# Patient Record
Sex: Male | Born: 1965
Health system: Southern US, Community
[De-identification: ages and names within clinical notes are randomized; demographics above are authoritative.]

## PROBLEM LIST (undated history)

## (undated) DIAGNOSIS — I639 Cerebral infarction, unspecified: Secondary | ICD-10-CM

## (undated) DIAGNOSIS — E119 Type 2 diabetes mellitus without complications: Secondary | ICD-10-CM

## (undated) DIAGNOSIS — I10 Essential (primary) hypertension: Secondary | ICD-10-CM

## (undated) DIAGNOSIS — I619 Nontraumatic intracerebral hemorrhage, unspecified: Secondary | ICD-10-CM

## (undated) DIAGNOSIS — E7800 Pure hypercholesterolemia, unspecified: Secondary | ICD-10-CM

## (undated) HISTORY — DX: Type 2 diabetes mellitus without complications: E11.9

## (undated) HISTORY — DX: Nontraumatic intracerebral hemorrhage, unspecified: I61.9

## (undated) HISTORY — DX: Pure hypercholesterolemia, unspecified: E78.00

---

## 1997-07-17 ENCOUNTER — Ambulatory Visit (HOSPITAL_BASED_OUTPATIENT_CLINIC_OR_DEPARTMENT_OTHER): Admission: RE | Admit: 1997-07-17 | Discharge: 1997-07-17 | Payer: Self-pay | Admitting: Otolaryngology

## 1998-02-15 HISTORY — PX: TONSILLECTOMY: SUR1361

## 1998-08-31 ENCOUNTER — Emergency Department (HOSPITAL_COMMUNITY): Admission: EM | Admit: 1998-08-31 | Discharge: 1998-08-31 | Payer: Self-pay | Admitting: Emergency Medicine

## 2004-06-13 ENCOUNTER — Emergency Department (HOSPITAL_COMMUNITY): Admission: EM | Admit: 2004-06-13 | Discharge: 2004-06-13 | Payer: Self-pay | Admitting: Family Medicine

## 2007-08-05 ENCOUNTER — Emergency Department (HOSPITAL_COMMUNITY): Admission: EM | Admit: 2007-08-05 | Discharge: 2007-08-05 | Payer: Self-pay | Admitting: Emergency Medicine

## 2010-11-12 LAB — CBC
HCT: 42
Hemoglobin: 14
MCHC: 33.3
MCV: 87.5
Platelets: 331
RBC: 4.8
RDW: 13.9
WBC: 8

## 2010-11-12 LAB — DIFFERENTIAL
Basophils Absolute: 0.1
Basophils Relative: 1
Eosinophils Absolute: 0
Eosinophils Relative: 0
Lymphocytes Relative: 18
Lymphs Abs: 1.4
Monocytes Absolute: 0.8
Monocytes Relative: 10
Neutro Abs: 5.6
Neutrophils Relative %: 70

## 2010-11-12 LAB — POCT I-STAT, CHEM 8
BUN: 17
Calcium, Ion: 1.17
Chloride: 103
Creatinine, Ser: 1.2
Glucose, Bld: 115 — ABNORMAL HIGH
HCT: 45
Hemoglobin: 15.3
Potassium: 4.6
Sodium: 139
TCO2: 31

## 2011-10-01 ENCOUNTER — Inpatient Hospital Stay (HOSPITAL_COMMUNITY): Payer: BC Managed Care – PPO

## 2011-10-01 ENCOUNTER — Inpatient Hospital Stay (HOSPITAL_COMMUNITY)
Admission: EM | Admit: 2011-10-01 | Discharge: 2011-10-22 | DRG: 879 | Disposition: A | Discharge status: Rehabilitation Facility | Payer: BC Managed Care – PPO | Attending: Pulmonary Disease | Admitting: Pulmonary Disease

## 2011-10-01 ENCOUNTER — Encounter (HOSPITAL_COMMUNITY): Payer: Self-pay

## 2011-10-01 ENCOUNTER — Emergency Department (HOSPITAL_COMMUNITY): Payer: BC Managed Care – PPO

## 2011-10-01 DIAGNOSIS — I517 Cardiomegaly: Secondary | ICD-10-CM | POA: Diagnosis present

## 2011-10-01 DIAGNOSIS — Z930 Tracheostomy status: Secondary | ICD-10-CM

## 2011-10-01 DIAGNOSIS — R4182 Altered mental status, unspecified: Secondary | ICD-10-CM | POA: Diagnosis present

## 2011-10-01 DIAGNOSIS — R112 Nausea with vomiting, unspecified: Secondary | ICD-10-CM | POA: Diagnosis not present

## 2011-10-01 DIAGNOSIS — R509 Fever, unspecified: Secondary | ICD-10-CM | POA: Diagnosis not present

## 2011-10-01 DIAGNOSIS — I619 Nontraumatic intracerebral hemorrhage, unspecified: Secondary | ICD-10-CM

## 2011-10-01 DIAGNOSIS — R0902 Hypoxemia: Secondary | ICD-10-CM | POA: Diagnosis present

## 2011-10-01 DIAGNOSIS — E876 Hypokalemia: Secondary | ICD-10-CM | POA: Diagnosis not present

## 2011-10-01 DIAGNOSIS — G911 Obstructive hydrocephalus: Secondary | ICD-10-CM | POA: Diagnosis present

## 2011-10-01 DIAGNOSIS — R29810 Facial weakness: Secondary | ICD-10-CM | POA: Diagnosis present

## 2011-10-01 DIAGNOSIS — Z91199 Patient's noncompliance with other medical treatment and regimen due to unspecified reason: Secondary | ICD-10-CM

## 2011-10-01 DIAGNOSIS — Z9119 Patient's noncompliance with other medical treatment and regimen: Secondary | ICD-10-CM

## 2011-10-01 DIAGNOSIS — D72829 Elevated white blood cell count, unspecified: Secondary | ICD-10-CM | POA: Diagnosis not present

## 2011-10-01 DIAGNOSIS — G936 Cerebral edema: Secondary | ICD-10-CM | POA: Diagnosis present

## 2011-10-01 DIAGNOSIS — R490 Dysphonia: Secondary | ICD-10-CM | POA: Diagnosis present

## 2011-10-01 DIAGNOSIS — R259 Unspecified abnormal involuntary movements: Secondary | ICD-10-CM | POA: Diagnosis not present

## 2011-10-01 DIAGNOSIS — IMO0002 Reserved for concepts with insufficient information to code with codable children: Secondary | ICD-10-CM | POA: Diagnosis not present

## 2011-10-01 DIAGNOSIS — R32 Unspecified urinary incontinence: Secondary | ICD-10-CM | POA: Diagnosis present

## 2011-10-01 DIAGNOSIS — R000 Tachycardia, unspecified: Secondary | ICD-10-CM | POA: Diagnosis present

## 2011-10-01 DIAGNOSIS — G8190 Hemiplegia, unspecified affecting unspecified side: Secondary | ICD-10-CM | POA: Diagnosis present

## 2011-10-01 DIAGNOSIS — Z781 Physical restraint status: Secondary | ICD-10-CM | POA: Diagnosis not present

## 2011-10-01 DIAGNOSIS — E162 Hypoglycemia, unspecified: Secondary | ICD-10-CM | POA: Diagnosis not present

## 2011-10-01 DIAGNOSIS — R51 Headache: Secondary | ICD-10-CM | POA: Diagnosis not present

## 2011-10-01 DIAGNOSIS — J9601 Acute respiratory failure with hypoxia: Secondary | ICD-10-CM | POA: Diagnosis not present

## 2011-10-01 DIAGNOSIS — R079 Chest pain, unspecified: Secondary | ICD-10-CM | POA: Diagnosis present

## 2011-10-01 DIAGNOSIS — J9600 Acute respiratory failure, unspecified whether with hypoxia or hypercapnia: Secondary | ICD-10-CM

## 2011-10-01 DIAGNOSIS — G932 Benign intracranial hypertension: Secondary | ICD-10-CM | POA: Diagnosis present

## 2011-10-01 DIAGNOSIS — I10 Essential (primary) hypertension: Secondary | ICD-10-CM

## 2011-10-01 DIAGNOSIS — R1310 Dysphagia, unspecified: Secondary | ICD-10-CM | POA: Diagnosis not present

## 2011-10-01 DIAGNOSIS — E871 Hypo-osmolality and hyponatremia: Secondary | ICD-10-CM | POA: Diagnosis present

## 2011-10-01 DIAGNOSIS — Z8673 Personal history of transient ischemic attack (TIA), and cerebral infarction without residual deficits: Secondary | ICD-10-CM

## 2011-10-01 HISTORY — DX: Essential (primary) hypertension: I10

## 2011-10-01 HISTORY — DX: Cerebral infarction, unspecified: I63.9

## 2011-10-01 HISTORY — DX: Nontraumatic intracerebral hemorrhage, unspecified: I61.9

## 2011-10-01 LAB — CBC WITH DIFFERENTIAL/PLATELET
Basophils Absolute: 0 10*3/uL (ref 0.0–0.1)
Basophils Relative: 0 % (ref 0–1)
Eosinophils Absolute: 0 10*3/uL (ref 0.0–0.7)
Eosinophils Relative: 1 % (ref 0–5)
HCT: 39.2 % (ref 39.0–52.0)
Hemoglobin: 12.9 g/dL — ABNORMAL LOW (ref 13.0–17.0)
Lymphocytes Relative: 20 % (ref 12–46)
Lymphs Abs: 1.2 10*3/uL (ref 0.7–4.0)
MCH: 29.2 pg (ref 26.0–34.0)
MCHC: 32.9 g/dL (ref 30.0–36.0)
MCV: 88.7 fL (ref 78.0–100.0)
Monocytes Absolute: 0.4 10*3/uL (ref 0.1–1.0)
Monocytes Relative: 6 % (ref 3–12)
Neutro Abs: 4.5 10*3/uL (ref 1.7–7.7)
Neutrophils Relative %: 73 % (ref 43–77)
Platelets: 262 10*3/uL (ref 150–400)
RBC: 4.42 MIL/uL (ref 4.22–5.81)
RDW: 13.1 % (ref 11.5–15.5)
WBC: 6.2 10*3/uL (ref 4.0–10.5)

## 2011-10-01 LAB — POCT I-STAT, CHEM 8
BUN: 10 mg/dL (ref 6–23)
Calcium, Ion: 1.18 mmol/L (ref 1.12–1.23)
Chloride: 105 mEq/L (ref 96–112)
Creatinine, Ser: 1.1 mg/dL (ref 0.50–1.35)
Glucose, Bld: 147 mg/dL — ABNORMAL HIGH (ref 70–99)
HCT: 42 % (ref 39.0–52.0)
Hemoglobin: 14.3 g/dL (ref 13.0–17.0)
Potassium: 3.8 mEq/L (ref 3.5–5.1)
Sodium: 143 mEq/L (ref 135–145)
TCO2: 26 mmol/L (ref 0–100)

## 2011-10-01 LAB — BLOOD GAS, ARTERIAL
Acid-Base Excess: 1.9 mmol/L (ref 0.0–2.0)
Acid-Base Excess: 1 mmol/L (ref 0.0–2.0)
Bicarbonate: 27.1 mEq/L — ABNORMAL HIGH (ref 20.0–24.0)
Bicarbonate: 25.3 mEq/L — ABNORMAL HIGH (ref 20.0–24.0)
Drawn by: 320991
Drawn by: 320991
O2 Content: 2 L/min
O2 Content: 2 L/min
O2 Saturation: 98.5 %
O2 Saturation: 96.8 %
Patient temperature: 98.6
Patient temperature: 98.6
TCO2: 28.6 mmol/L (ref 0–100)
TCO2: 26.5 mmol/L (ref 0–100)
pCO2 arterial: 51 mmHg — ABNORMAL HIGH (ref 35.0–45.0)
pCO2 arterial: 41.3 mmHg (ref 35.0–45.0)
pH, Arterial: 7.345 — ABNORMAL LOW (ref 7.350–7.450)
pH, Arterial: 7.403 (ref 7.350–7.450)
pO2, Arterial: 99.3 mmHg (ref 80.0–100.0)
pO2, Arterial: 84.1 mmHg (ref 80.0–100.0)

## 2011-10-01 LAB — CK TOTAL AND CKMB (NOT AT ARMC)
CK, MB: 3.3 ng/mL (ref 0.3–4.0)
Relative Index: 1.2 (ref 0.0–2.5)
Total CK: 277 U/L — ABNORMAL HIGH (ref 7–232)

## 2011-10-01 LAB — DIFFERENTIAL
Basophils Absolute: 0 10*3/uL (ref 0.0–0.1)
Basophils Relative: 0 % (ref 0–1)
Eosinophils Absolute: 0.2 10*3/uL (ref 0.0–0.7)
Eosinophils Relative: 3 % (ref 0–5)
Lymphocytes Relative: 38 % (ref 12–46)
Lymphs Abs: 2.4 10*3/uL (ref 0.7–4.0)
Monocytes Absolute: 0.6 10*3/uL (ref 0.1–1.0)
Monocytes Relative: 10 % (ref 3–12)
Neutro Abs: 3 10*3/uL (ref 1.7–7.7)
Neutrophils Relative %: 49 % (ref 43–77)

## 2011-10-01 LAB — PROTIME-INR
INR: 1.04 (ref 0.00–1.49)
INR: 1.09 (ref 0.00–1.49)
Prothrombin Time: 13.8 seconds (ref 11.6–15.2)
Prothrombin Time: 14.3 seconds (ref 11.6–15.2)

## 2011-10-01 LAB — COMPREHENSIVE METABOLIC PANEL
ALT: 23 U/L (ref 0–53)
ALT: 23 U/L (ref 0–53)
AST: 24 U/L (ref 0–37)
AST: 23 U/L (ref 0–37)
Albumin: 3.5 g/dL (ref 3.5–5.2)
Albumin: 3.6 g/dL (ref 3.5–5.2)
Alkaline Phosphatase: 109 U/L (ref 39–117)
Alkaline Phosphatase: 104 U/L (ref 39–117)
BUN: 11 mg/dL (ref 6–23)
BUN: 9 mg/dL (ref 6–23)
CO2: 25 mEq/L (ref 19–32)
CO2: 30 mEq/L (ref 19–32)
Calcium: 9.3 mg/dL (ref 8.4–10.5)
Calcium: 9.2 mg/dL (ref 8.4–10.5)
Chloride: 103 mEq/L (ref 96–112)
Chloride: 104 mEq/L (ref 96–112)
Creatinine, Ser: 0.92 mg/dL (ref 0.50–1.35)
Creatinine, Ser: 0.9 mg/dL (ref 0.50–1.35)
GFR calc Af Amer: >90 mL/min (ref >90)
GFR calc Af Amer: >90 mL/min (ref >90)
GFR calc non Af Amer: >90 mL/min (ref >90)
GFR calc non Af Amer: >90 mL/min (ref >90)
Glucose, Bld: 144 mg/dL — ABNORMAL HIGH (ref 70–99)
Glucose, Bld: 127 mg/dL — ABNORMAL HIGH (ref 70–99)
Potassium: 3.8 mEq/L (ref 3.5–5.1)
Potassium: 4 mEq/L (ref 3.5–5.1)
Sodium: 138 mEq/L (ref 135–145)
Sodium: 139 mEq/L (ref 135–145)
Total Bilirubin: 0.2 mg/dL — ABNORMAL LOW (ref 0.3–1.2)
Total Bilirubin: 0.2 mg/dL — ABNORMAL LOW (ref 0.3–1.2)
Total Protein: 8.5 g/dL — ABNORMAL HIGH (ref 6.0–8.3)
Total Protein: 8.6 g/dL — ABNORMAL HIGH (ref 6.0–8.3)

## 2011-10-01 LAB — VITAMIN B12: Vitamin B-12: 687 pg/mL (ref 211–911)

## 2011-10-01 LAB — URINALYSIS, ROUTINE W REFLEX MICROSCOPIC
Bilirubin Urine: NEGATIVE
Glucose, UA: NEGATIVE mg/dL
Ketones, ur: NEGATIVE mg/dL
Leukocytes, UA: NEGATIVE
Nitrite: NEGATIVE
Protein, ur: 30 mg/dL — AB
Specific Gravity, Urine: 1.012 (ref 1.005–1.030)
Urobilinogen, UA: 1 mg/dL (ref 0.0–1.0)
pH: 7 (ref 5.0–8.0)

## 2011-10-01 LAB — URINE MICROSCOPIC-ADD ON

## 2011-10-01 LAB — CBC
HCT: 39.8 % (ref 39.0–52.0)
Hemoglobin: 13.3 g/dL (ref 13.0–17.0)
MCH: 29.4 pg (ref 26.0–34.0)
MCHC: 33.4 g/dL (ref 30.0–36.0)
MCV: 88.1 fL (ref 78.0–100.0)
Platelets: 265 10*3/uL (ref 150–400)
RBC: 4.52 MIL/uL (ref 4.22–5.81)
RDW: 13.1 % (ref 11.5–15.5)
WBC: 6.2 10*3/uL (ref 4.0–10.5)

## 2011-10-01 LAB — GLUCOSE, CAPILLARY
Glucose-Capillary: 132 mg/dL — ABNORMAL HIGH (ref 70–99)
Glucose-Capillary: 171 mg/dL — ABNORMAL HIGH (ref 70–99)

## 2011-10-01 LAB — TROPONIN I: Troponin I: <0.3 ng/mL (ref <0.30)

## 2011-10-01 LAB — PRO B NATRIURETIC PEPTIDE: Pro B Natriuretic peptide (BNP): 36.5 pg/mL (ref 0–125)

## 2011-10-01 LAB — HOMOCYSTEINE: Homocysteine: 9.7 umol/L (ref 4.0–15.4)

## 2011-10-01 LAB — TSH: TSH: 1.837 u[IU]/mL (ref 0.350–4.500)

## 2011-10-01 LAB — APTT
aPTT: 31 seconds (ref 24–37)
aPTT: 30 seconds (ref 24–37)

## 2011-10-01 LAB — MRSA PCR SCREENING: MRSA by PCR: NEGATIVE

## 2011-10-01 LAB — PROCALCITONIN: Procalcitonin: <0.1 ng/mL

## 2011-10-01 LAB — MAGNESIUM: Magnesium: 2.1 mg/dL (ref 1.5–2.5)

## 2011-10-01 LAB — PHOSPHORUS: Phosphorus: 1.5 mg/dL — ABNORMAL LOW (ref 2.3–4.6)

## 2011-10-01 LAB — HEMOGLOBIN A1C
Hgb A1c MFr Bld: 5.7 % — ABNORMAL HIGH (ref <5.7)
Mean Plasma Glucose: 117 mg/dL — ABNORMAL HIGH (ref <117)

## 2011-10-01 LAB — ANTITHROMBIN III: AntiThromb III Func: 105 % (ref 75–120)

## 2011-10-01 MED ORDER — ONDANSETRON HCL 4 MG/2ML IJ SOLN
4.0000 mg | Freq: Four times a day (QID) | INTRAMUSCULAR | Status: DC | PRN
  Administered 2011-10-01: 4 mg via INTRAVENOUS
  Filled 2011-10-01: qty 2

## 2011-10-01 MED ORDER — PANTOPRAZOLE SODIUM 40 MG IV SOLR
40.0000 mg | Freq: Every day | INTRAVENOUS | Status: DC
  Administered 2011-10-01 – 2011-10-03 (×3): 40 mg via INTRAVENOUS
  Filled 2011-10-01 (×4): qty 40

## 2011-10-01 MED ORDER — ACETAMINOPHEN 10 MG/ML IV SOLN
1000.0000 mg | Freq: Two times a day (BID) | INTRAVENOUS | Status: AC
  Administered 2011-10-01 (×2): 1000 mg via INTRAVENOUS
  Filled 2011-10-01 (×2): qty 100

## 2011-10-01 MED ORDER — SUCCINYLCHOLINE CHLORIDE 20 MG/ML IJ SOLN
INTRAMUSCULAR | Status: AC
  Filled 2011-10-01: qty 10

## 2011-10-01 MED ORDER — NICARDIPINE HCL IN NACL 20-0.86 MG/200ML-% IV SOLN
3.0000 mg/h | INTRAVENOUS | Status: DC
  Administered 2011-10-01: 8 mg/h via INTRAVENOUS
  Administered 2011-10-01: 5.5 mg/h via INTRAVENOUS
  Administered 2011-10-01: 8 mg/h via INTRAVENOUS
  Administered 2011-10-01: 3 mg/h via INTRAVENOUS
  Administered 2011-10-02 (×2): 5.5 mg/h via INTRAVENOUS
  Administered 2011-10-02 (×2): 8 mg/h via INTRAVENOUS
  Administered 2011-10-02: 5.5 mg/h via INTRAVENOUS
  Administered 2011-10-02: 8 mg/h via INTRAVENOUS
  Filled 2011-10-01 (×14): qty 200

## 2011-10-01 MED ORDER — MORPHINE SULFATE 2 MG/ML IJ SOLN
1.0000 mg | INTRAMUSCULAR | Status: DC | PRN
  Administered 2011-10-01 – 2011-10-02 (×4): 1 mg via INTRAVENOUS
  Filled 2011-10-01 (×4): qty 1

## 2011-10-01 MED ORDER — DEXAMETHASONE SODIUM PHOSPHATE 4 MG/ML IJ SOLN
4.0000 mg | Freq: Four times a day (QID) | INTRAMUSCULAR | Status: DC
  Administered 2011-10-01 – 2011-10-02 (×3): 4 mg via INTRAVENOUS
  Filled 2011-10-01 (×7): qty 1

## 2011-10-01 MED ORDER — LIDOCAINE HCL (CARDIAC) 20 MG/ML IV SOLN
INTRAVENOUS | Status: AC
  Filled 2011-10-01: qty 5

## 2011-10-01 MED ORDER — ONDANSETRON HCL 4 MG/2ML IJ SOLN
4.0000 mg | Freq: Three times a day (TID) | INTRAMUSCULAR | Status: AC | PRN
  Administered 2011-10-01: 4 mg via INTRAVENOUS
  Filled 2011-10-01: qty 2

## 2011-10-01 MED ORDER — ETOMIDATE 2 MG/ML IV SOLN
INTRAVENOUS | Status: AC
  Filled 2011-10-01: qty 20

## 2011-10-01 MED ORDER — ROCURONIUM BROMIDE 50 MG/5ML IV SOLN
INTRAVENOUS | Status: AC
  Filled 2011-10-01: qty 2

## 2011-10-01 MED ORDER — SODIUM CHLORIDE 0.9 % IV SOLN
INTRAVENOUS | Status: DC
  Administered 2011-10-01: 1000 mL via INTRAVENOUS
  Administered 2011-10-01: 13:00:00 via INTRAVENOUS
  Administered 2011-10-02: 100 mL/h via INTRAVENOUS
  Administered 2011-10-02 – 2011-10-05 (×5): via INTRAVENOUS
  Administered 2011-10-05: 10 mL/h via INTRAVENOUS
  Administered 2011-10-06: 20 mL/h via INTRAVENOUS
  Administered 2011-10-13: 23:00:00 via INTRAVENOUS
  Administered 2011-10-15: 20 mL via INTRAVENOUS
  Administered 2011-10-18: 1000 mL via INTRAVENOUS
  Filled 2011-10-01: qty 1000

## 2011-10-01 MED ORDER — ACETAMINOPHEN 650 MG RE SUPP: 650.0000 mg | Freq: Four times a day (QID) | RECTAL | Status: DC | PRN

## 2011-10-01 MED ORDER — MORPHINE SULFATE 2 MG/ML IJ SOLN
INTRAMUSCULAR | Status: AC
  Filled 2011-10-01: qty 1

## 2011-10-01 MED ORDER — SENNOSIDES-DOCUSATE SODIUM 8.6-50 MG PO TABS
1.0000 | ORAL_TABLET | Freq: Two times a day (BID) | ORAL | Status: DC
  Administered 2011-10-01 – 2011-10-21 (×27): 1 via ORAL
  Filled 2011-10-01 (×40): qty 1

## 2011-10-01 NOTE — Progress Notes (Signed)
*  PRELIMINARY RESULTS* Echocardiogram 2D Echocardiogram has been performed.  Jeryl Columbia 10/01/2011, 2:23 PM

## 2011-10-01 NOTE — ED Notes (Signed)
Pt. Arrived with lt. Side weakness, alert and oriented X3, airway is patent, no resp. Distress.  Speech is garbled. Pt. Transferred directly to CT scan

## 2011-10-01 NOTE — ED Notes (Signed)
Dr Eilleen Kempf spoke with patient wife explaining the severeity of his stroke and the possiblity that he may need to intubate him to protect his airway,

## 2011-10-01 NOTE — ED Notes (Signed)
cbg done....132

## 2011-10-01 NOTE — Consult Note (Signed)
Location: ICU Consulting physician: Dr. Preston Fleeting Code stroke   Chief complaint: Dysarthria, left hemiplegia, left facial droop, left neglect, and left hemisensory loss HPI:  This is  46 y/o RHAAM  Who has been c/o of headaches for the past 7 days, with nausea and vomiting, this morning according to his wife he had visual disturbance and then he vomited followed by left sided weakness approximately 7 AM.  Patient arrived to the ED. Head CT was done that showed, Right  BG, bleed with some area involved in thalamus. With minimal mass effect.  Patient was seen at the bedside, he was awake, alert and followed commands.  He denied chest pain, no sob, no abdominal pain, no nausea, no vomiting,  C/o of double vision.    According to the wife he has stopped taking his BP medications several years ago and does not go the physician as  Suggested. Previously, he had significant neurological symptoms with significant elevated BP that could have been attributed to Hypertensive encephalopathy.    LSN: 7 Am  Past Medical History  Diagnosis Date  . Hypertension   . Stroke    .  Sleep apnea  . Obesity . Cardiac arrythmia   Past surgical history:  Tonsillectomy: 2000  Social History: Patient is married, lives at home with his wife, no smoking, alcohol or illicit drugs. He works in Geologist, engineering  Family history: Both the parents with Hypertension and cardiomyopathy  Allergies: No Known Allergies  ROS: All the 14 review of systems were reviewed and pertinent are mentioned in the HPI   Physical Examination: Blood pressure 166/101, pulse 79, temperature 98.6 F (37 C), resp. rate 23, SpO2 98.00%.  General Examination: HEENT-  Normocephalic, no lesions, without obvious abnormality.  Exopthalomos, left eye lid apraxia, pupils isocoria, reactive, left neglect, no lid lag, no ptosis,  Normal TM's bilaterally.  Normal auditory canals and external ears. Normal external nose, mucus  membranes and septum.  Normal pharynx. Neck supple with no masses, nodes, nodules or enlargement., no carotid bruit Cardiovascular - IRR, aortic murmur, no rubs and or gallops Lungs - CTA Abdomen - Soft, non distended, non tender,  Bowel sounds present, obese Extremities - no edema Skin: no rash   Neurologic Examination: Mental Status: Alert, oriented, thought content appropriate.   Memory intact No hallucinations No delusions  Speech : No aphasia, but has dysarthria  Able to follow 3 step commands without difficulty. Cranial Nerves: Pupils round and reactive, left neglect III,IV, VI: ptosis not present, extra-ocular motions left neglect V,VII: left facial droop,  Hyperesthesia on the left facial  Area. VIII: hearing normal bilaterally IX,X: gag reflex present XI: trapezius strength/neck flexion strength normal bilaterally XII: tongue strength  Mildly deviated to the leftl  Motor: Right : Upper extremity   5/5    Left:     Upper extremity   0/5  Lower extremity   5/5     Lower extremity   0/5   Left sided hemiplegic with flaccid tone bulk:normal throughout; no atrophy noted  Sensory: Pinprick and light touch intact throughout on the right but on the left has hyperesthesia on the face and some on the LE but has decreased pin prick on the left UE  Deep Tendon Reflexes: 1 and symmetric throughout No clonus Plantars: Right: downgoing   Left: downgoing Cerebellar: normal finger-to-nose, normal rapid alternating movements  On the right Left flaccid secondary to stroke  Extrapyramidal: none   Laboratory Studies:   Basic Metabolic  Panel:  Lab 10/01/11 0757 10/01/11 0742  NA 143 138  K 3.8 3.8  CL 105 103  CO2 -- 25  GLUCOSE 147* 144*  BUN 10 11  CREATININE 1.10 0.92  CALCIUM -- 9.3  MG -- --  PHOS -- --    Liver Function Tests:  Lab 10/01/11 0742  AST 24  ALT 23  ALKPHOS 109  BILITOT 0.2*  PROT 8.5*  ALBUMIN 3.5   No results found for this basename:  LIPASE:5,AMYLASE:5 in the last 168 hours No results found for this basename: AMMONIA:3 in the last 168 hours  CBC:  Lab 10/01/11 0757 10/01/11 0742  WBC -- 6.2  NEUTROABS -- 3.0  HGB 14.3 13.3  HCT 42.0 39.8  MCV -- 88.1  PLT -- 265    Cardiac Enzymes:  Lab 10/01/11 0742  CKTOTAL 277*  CKMB 3.3  CKMBINDEX --  TROPONINI <0.30    BNP: No components found with this basename: POCBNP:5  CBG: No results found for this basename: GLUCAP:5 in the last 168 hours  Microbiology: No results found for this or any previous visit.  Coagulation Studies:  Basename 10/01/11 0742  LABPROT 13.8  INR 1.04    Urinalysis: No results found for this basename: COLORURINE:2,APPERANCEUR:2,LABSPEC:2,PHURINE:2,GLUCOSEU:2,HGBUR:2,BILIRUBINUR:2,KETONESUR:2,PROTEINUR:2,UROBILINOGEN:2,NITRITE:2,LEUKOCYTESUR:2 in the last 168 hours  Lipid Panel:  No results found for this basename: chol,  trig,  hdl,  cholhdl,  vldl,  ldlcalc    HgbA1C:  No results found for this basename: HGBA1C    Urine Drug Screen:   No results found for this basename: labopia,  cocainscrnur,  labbenz,  amphetmu,  thcu,  labbarb    Alcohol Level: No results found for this basename: ETH:2 in the last 168 hours  Other results: EKG: normal EKG, normal sinus rhythm, unchanged from previous tracings, nonspecific ST and T waves changes.  Imaging: Ct Head Wo Contrast  10/01/2011  *RADIOLOGY REPORT*  Clinical Data: Code stroke, fell on the way to the bathroom today, left-sided weakness  CT HEAD WITHOUT CONTRAST  Technique:  Contiguous axial images were obtained from the base of the skull through the vertex without contrast.  Comparison: CT brain scan of 08/05/2007  Findings: There is an acute bleed within the right basilar ganglia primarily from the region of the right thalamus.  Very little surrounding edema is seen.  This basal ganglial hemorrhage measures 3.7 x 1.9 cm with very little mass effect. There is a small amount of  acute blood within the third ventricle, but no ventricular dilatation is seen currently.  Otherwise the ventricular system is unremarkable and the septum is midline in position.  There is mild small vessel ischemic change present.  On bone window images, no acute bony abnormality is seen.  The paranasal sinuses that are visualized are clear.  IMPRESSION:  1.  Acute right basal ganglial hemorrhage with only minimal surrounding edema and very little mass effect. 2.  A small amount of blood is noted within the nondilated third ventricle.  The ventricular system is normal in size currently with no mass effect evident.  Critical Value/emergent results were called by telephone at the time of interpretation on 10/01/2011 at 7:57 a.m. to Dr. Preston Fleeting, who verbally acknowledged these results.  Original Report Authenticated By: Juline Patch, M.D.    Assessment and plan:     46 y.o. male  With history of hypertension and obesity, poor  Complaint to medications, comes with headaches, nausea and vomiting going on for the past 7 days. This morning he  had focal neurological deficits, left hemiplegia, left neglect, dysarthric speech. And head CT indicated Left BG bleed with mild edema but no  Herniation and some IVH.   Neurosurgery was consulted and as suspected was not a candidate for surgery.  Patient has a bleed secondary to the hypertension, but given his age he needs to be worked up for hypercoagulability and vasculitis.  Patient is admitted to ICU under the Critical Care team, Dr. Katina Degree.  Recommendations: 1) Admit to Neuro- ICU 2) Strict BP parameters: 150- 160 / 89-90: Do not drop the BP robustly.  3) Fall and aspiration precautions 4) No anticoagulation. 5) Use Sequentials for DVT prophylaxis 6) Patient is a full code, if needed  Patient can be intubated 7) Patient does have some early edema on the head CT which may get worse and can be started on Decadron and on 3% Na 8) Patient has Arrythmia, will need  Telemetry  Monitoring and  TTE 9) Pain control preferably IV tylenol 10) No prophylaxis anti- epileptic  Drugs 11) PT/OT and speech evaluation 12) Neurochecks ICU protocol 13) Repeat head CT daily    Wilbert Hayashi V-P Eilleen Kempf., MD., Ph.D.,MS 10/01/2011 9:12 AM

## 2011-10-01 NOTE — Code Documentation (Signed)
Code Stroke called 0717 Patient Arrival [phone] EDP exam [phone] Stroke team arrival 0728 Last Seen Normal 0400 Pt arrival in CT [phone] Phlebotomist arrival [phone]  Patient up multiple times during the night to use the restroom. The last time he ambulated was about 4am. Upon getting out of bed this am at 0700 patient unable to use his left side.  EMS transported to hospital.   Per MD CT scan with hemorrhage.  NIHSS 14

## 2011-10-01 NOTE — ED Notes (Signed)
Dr. Eilleen Kempf at the bedside speaking with pt. s wife.  Chaplain also has arrived.

## 2011-10-01 NOTE — ED Notes (Signed)
Pt.  's x-ray is completed and lab at the bedside.

## 2011-10-01 NOTE — Progress Notes (Signed)
Chaplain responded to a referral to provide support to the family of a patient who is suffering a stroke. Chaplain offered emotional support. Patient's family stated that they are coping okay. Follow up as needed.

## 2011-10-01 NOTE — Progress Notes (Signed)
SLP Cancellation Note  Evaluation cancelled today due to medical issues with patient which prohibited therapy.  Per RN, patient very lethargic at this time. Will plan to f/u 8/17. Noted patient passed RN stroke swallow screen earlier today when per RN, was much more alert. Advise to keep NPO until mentation improves.   Jeremy [name] 10/01/2011, 3:19 PM

## 2011-10-01 NOTE — H&P (Signed)
Name: Jeremy [name] MRN: [id number] DOB: 12/13/1965    LOS: 0  Referring Provider:  Neuro Reason for Referral:  CVA    PULMONARY / CRITICAL CARE MEDICINE  HPI:  46 yo AAM with PMH of HTN presented to [facility] ED on 8/16 via EMS as a code stroke.  He woke up at 7 am and was unable to ambulate to bathroom, use left hand and was incontinent of urine.  He apparently woke several times during the night to urinate (last at 4am).  He noted right sided headache and vomited in the ambulance.  CT eval demonstrated R basal banglia hemorrhage with minimal surrounding edema.  PCCM consulted for ICU mgmt.    Past Medical History  Diagnosis Date  . Hypertension   . Stroke    History reviewed. No pertinent past surgical history. Prior to Admission medications   Not on File   Allergies No Known Allergies  Family History History reviewed. No pertinent family history. Social History  reports that he has never smoked. He does not have any smokeless tobacco history on file. He reports that he does not drink alcohol or use illicit drugs.  Review Of Systems:  Unable to complete as pt is altered.    Brief patient description:  46 y/o AAM with HTN admitted with R basal ganglia hemorrhage.   Events Since Admission: 8/16 Admit with basal ganglia hemorrhage   Vital Signs: Temp:  [98.6 F (37 C)] 98.6 F (37 C) (08/16 0800) Pulse Rate:  [78-88] 78  (08/16 0921) Resp:  [19-25] 19  (08/16 0921) BP: (163-169)/(100-114) 166/103 mmHg (08/16 0921) SpO2:  [95 %-100 %] 100 % (08/16 0921)  Physical Examination: General:  Chronically ill in NAD Neuro:  AAOx4, no movement on L side, R with spontaneous movt, unilateral ptosis on R HEENT:  Mm pink/moist Cardiovascular:  s1s2 rrr, no m/r/g Lungs:  resp's even/non-labored, lungs bilaterally coarse Abdomen:  Round/soft, bsx4 active Musculoskeletal:  No acute deformities Skin:  Warm/dry  Active Problems:  ICH (intracerebral hemorrhage)  HTN (hypertension)  Hypoxemia  Altered mental status   ASSESSMENT AND PLAN  NEUROLOGIC  A:   Acute R Basal Ganglia Hemorrhage -hemorrhage with small amt edema  P:   -neuro checks -further imaging per Neurology -cycles enzymes, ECHO -seizure precautions -decadron   PULMONARY No results found for this basename: PHART:5,PCO2:5,PCO2ART:5,PO2ART:5,HCO3:5,O2SAT:5 in the last 168 hours Ventilator Settings:   CXR:  8/16 no acute disease ETT:    A:   No acute issues  P:   -supportive care, O2 to keep sats -aspiration precautions -monitor respiratory status closely with CVA  CARDIOVASCULAR  Lab 10/01/11 0742  TROPONINI <0.30  LATICACIDVEN --  PROBNP --   ECG:  SR  Lines:    A: HTN  P:  -cardene gtt  -cycle enzymes, ECHO -check BNP, UDS  RENAL  Lab 10/01/11 0757 10/01/11 0742  NA 143 138  K 3.8 3.8  CL 105 103  CO2 -- 25  BUN 10 11  CREATININE 1.10 0.92  CALCIUM -- 9.3  MG -- --  PHOS -- --   Intake/Output    None    Foley:  8/16>>>  A:   No acute issues  P:   -trend BMP  GASTROINTESTINAL  Lab 10/01/11 0742  AST 24  ALT 23  ALKPHOS 109  BILITOT 0.2*  PROT 8.5*  ALBUMIN 3.5    A:   NPO Rule out Dysphagia  P:   -NPO until Speech Eval  HEMATOLOGIC  Lab 10/01/11 0757 10/01/11 0742  HGB 14.3 13.3  HCT 42.0 39.8  PLT -- 265  INR -- 1.04  APTT -- 31   A:   No acute issues  P:  -monitor cbc, PT/INR  INFECTIOUS  Lab 10/01/11 0742  WBC 6.2  PROCALCITON --   Cultures:  Antibiotics:   A:   No acute infectious process  P:   -monitor leukocytosis, fever  ENDOCRINE No results found for this basename: GLUCAP:5 in the last 168 hours A:   No acute issues  P:   -monitor BMP     BEST PRACTICE / DISPOSITION Level of Care:  ICU Primary Service:  PCCM Consultants:  Neurology Code Status:  Full Diet:  NPO DVT Px:  SCD's GI Px:  Not indicated  Skin Integrity:  intact Social / Family:  None available  Canary Brim,  NP-C [facility] Pgr: [phone] or [phone]     10/01/2011, 10:01 AM   Patient is a very high risk for respiratory failure due to inability to protect airway.  BP and CVA management per neuro.  Will maintain NPO for now and if mental status is better in AM and N/V are controlled then will order swallow evaluation in AM.  CC time 45 min.  Patient seen and examined, agree with above note.  I dictated the care and orders written for this patient under my direction.  Koren Bound, M.D. [phone]

## 2011-10-01 NOTE — ED Provider Notes (Signed)
History     CSN: [id number]  Arrival date & time 10/01/11  1914   First MD Initiated Contact with Patient 10/01/11 [phone]      Chief Complaint  Patient presents with  . Code Stroke    (Consider location/radiation/quality/duration/timing/severity/associated sxs/prior treatment) The history is provided by the patient and the EMS personnel. The history is limited by the condition of the patient (Poor historian).   46 year old male is brought in by EMS as a code stroke. He was reported to have been unable to stand on getting up to go to the bathroom at 7 AM today. He is aware that he cannot use his left hand. He had vomited in the ambulance coming in. He had also been incontinent of urine at home, but it is not clear whether that was true incontinence or merely that he could not get to the bathroom because of his inability to walk. He states that he was able to use his left arm and leg fine when he went to bed and when he woke up at 1:00 to urinate when he woke up at 2:00 urinate and when he woke up at 3:00 to urinate and when he woke up at 4:00 to urinate. He is not clear if he woke up after 4:00 before the time that he fell getting out of bed at 7 AM. He does have a mild right sided headache. He denies chest pain.  No past medical history on file.  No past surgical history on file.  No family history on file.  History  Substance Use Topics  . Smoking status: Not on file  . Smokeless tobacco: Not on file  . Alcohol Use: Not on file      Review of Systems  All other systems reviewed and are negative.    Allergies  Review of patient's allergies indicates not on file.  Home Medications  No current outpatient prescriptions on file.  There were no vitals taken for this visit.  Physical Exam  Nursing note and vitals reviewed. 46year old male, resting comfortably and in no acute distress. Vital signs are significant for hypertension with blood pressure 160/112. Oxygen saturation  is 100%, which is normal. Head is normocephalic and atraumatic. PERRLA, he has preferential gaze to the right and will not look all the way to the left. Oropharynx is clear. Left facial droop is present. Neck is nontender and supple without adenopathy or JVD or bruit. Back is nontender and there is no CVA tenderness. Lungs are clear without rales, wheezes, or rhonchi. Chest is nontender. Heart has regular rate and rhythm without murmur. Abdomen is soft, flat, nontender without masses or hepatosplenomegaly and peristalsis is normoactive. Extremities have no cyanosis or edema, full range of motion is present. Skin is warm and dry without rash. Neurologic: He is awake, alert, oriented. Speech is slightly dysarthric. Cranial nerves are significant for inability to localize the left, and left facial droop. He has a dense left hemiparesis .   ED Course  Procedures (including critical care time)  Results for orders placed during the hospital encounter of 10/01/11  PROTIME-INR      Component Value Range   Prothrombin Time 13.8  11.6 - 15.2 seconds   INR 1.04  0.00 - 1.49  APTT      Component Value Range   aPTT 31  24 - 37 seconds  CBC      Component Value Range   WBC 6.2  4.0 - 10.5 K/uL   RBC  4.52  4.22 - 5.81 MIL/uL   Hemoglobin 13.3  13.0 - 17.0 g/dL   HCT 16.1  09.6 - 04.5 %   MCV 88.1  78.0 - 100.0 fL   MCH 29.4  26.0 - 34.0 pg   MCHC 33.4  30.0 - 36.0 g/dL   RDW 40.9  81.1 - 91.4 %   Platelets 265  150 - 400 K/uL  DIFFERENTIAL      Component Value Range   Neutrophils Relative 49  43 - 77 %   Neutro Abs 3.0  1.7 - 7.7 K/uL   Lymphocytes Relative 38  12 - 46 %   Lymphs Abs 2.4  0.7 - 4.0 K/uL   Monocytes Relative 10  3 - 12 %   Monocytes Absolute 0.6  0.1 - 1.0 K/uL   Eosinophils Relative 3  0 - 5 %   Eosinophils Absolute 0.2  0.0 - 0.7 K/uL   Basophils Relative 0  0 - 1 %   Basophils Absolute 0.0  0.0 - 0.1 K/uL  COMPREHENSIVE METABOLIC PANEL      Component Value Range    Sodium 138  135 - 145 mEq/L   Potassium 3.8  3.5 - 5.1 mEq/L   Chloride 103  96 - 112 mEq/L   CO2 25  19 - 32 mEq/L   Glucose, Bld 144 (*) 70 - 99 mg/dL   BUN 11  6 - 23 mg/dL   Creatinine, Ser 7.82  0.50 - 1.35 mg/dL   Calcium 9.3  8.4 - 95.6 mg/dL   Total Protein 8.5 (*) 6.0 - 8.3 g/dL   Albumin 3.5  3.5 - 5.2 g/dL   AST 24  0 - 37 U/L   ALT 23  0 - 53 U/L   Alkaline Phosphatase 109  39 - 117 U/L   Total Bilirubin 0.2 (*) 0.3 - 1.2 mg/dL   GFR calc non Af Amer >90  >90 mL/min   GFR calc Af Amer >90  >90 mL/min  CK TOTAL AND CKMB      Component Value Range   Total CK 277 (*) 7 - 232 U/L   CK, MB 3.3  0.3 - 4.0 ng/mL   Relative Index 1.2  0.0 - 2.5  TROPONIN I      Component Value Range   Troponin I <0.30  <0.30 ng/mL  POCT I-STAT, CHEM 8      Component Value Range   Sodium 143  135 - 145 mEq/L   Potassium 3.8  3.5 - 5.1 mEq/L   Chloride 105  96 - 112 mEq/L   BUN 10  6 - 23 mg/dL   Creatinine, Ser 2.13  0.50 - 1.35 mg/dL   Glucose, Bld 086 (*) 70 - 99 mg/dL   Calcium, Ion 5.78  4.69 - 1.23 mmol/L   TCO2 26  0 - 100 mmol/L   Hemoglobin 14.3  13.0 - 17.0 g/dL   HCT 62.9  52.8 - 41.3 %   Ct Head Wo Contrast  10/01/2011  *RADIOLOGY REPORT*  Clinical Data: Code stroke, fell on the way to the bathroom today, left-sided weakness  CT HEAD WITHOUT CONTRAST  Technique:  Contiguous axial images were obtained from the base of the skull through the vertex without contrast.  Comparison: CT brain scan of 08/05/2007  Findings: There is an acute bleed within the right basilar ganglia primarily from the region of the right thalamus.  Very little surrounding edema is seen.  This basal ganglial hemorrhage  measures 3.7 x 1.9 cm with very little mass effect. There is a small amount of acute blood within the third ventricle, but no ventricular dilatation is seen currently.  Otherwise the ventricular system is unremarkable and the septum is midline in position.  There is mild small vessel ischemic  change present.  On bone window images, no acute bony abnormality is seen.  The paranasal sinuses that are visualized are clear.  IMPRESSION:  1.  Acute right basal ganglial hemorrhage with only minimal surrounding edema and very little mass effect. 2.  A small amount of blood is noted within the nondilated third ventricle.  The ventricular system is normal in size currently with no mass effect evident.  Critical Value/emergent results were called by telephone at the time of interpretation on 10/01/2011 at 7:57 a.m. to Dr. Preston Fleeting, who verbally acknowledged these results.  Original Report Authenticated By: Juline Patch, M.D.    Images viewed by me, discussed with radiologist.   Date: 10/01/2011  Rate: 86  Rhythm: normal sinus rhythm  QRS Axis: normal  Intervals: normal  ST/T Wave abnormalities: ST depression, T wave inversion ininferior and anteroateral leads  Conduction Disutrbances:none  Narrative Interpretation: Tall R wave in V2 which may represent an old posterior wall myocardial infarction, inferolateral ST and T changes. No old ECG available for comparison.  Old EKG Reviewed: none available    1. Intracerebral hemorrhage   2. Hypertension    CRITICAL CARE Performed by: Dione Booze   Total critical care time: 50 minutes  Critical care time was exclusive of separately billable procedures and treating other patients.  Critical care was necessary to treat or prevent imminent or life-threatening deterioration.  Critical care was time spent personally by me on the following activities: development of treatment plan with patient and/or surrogate as well as nursing, discussions with consultants, evaluation of patient's response to treatment, examination of patient, obtaining history from patient or surrogate, ordering and performing treatments and interventions, ordering and review of laboratory studies, ordering and review of radiographic studies, pulse oximetry and re-evaluation of  patient's condition.    MDM  Right hemisphere stroke with dense left hemiparesis. His last known to be normal time is uncertain given his difficulty in getting an accurate history. It sounds like he was able to alter the bathroom at 4 AM but not when he woke up again at 7 AM. He implies that he is waking up every hour but he cannot be sure about the time after 4 AM and even that time is not iron, and. Accordingly, he would be outside the window of thrombolytic treatment.  CT shows basal ganglia bleed. He will need to be admitted to the neuro hospitalist service.  Case is discussed with Dr. Eilleen Kempf, who will admit the patient.  He is reassessed several times - he continues to protect his airway well.  Dione Booze, MD 10/01/11 [phone]

## 2011-10-02 ENCOUNTER — Inpatient Hospital Stay (HOSPITAL_COMMUNITY): Payer: BC Managed Care – PPO

## 2011-10-02 DIAGNOSIS — G936 Cerebral edema: Secondary | ICD-10-CM | POA: Diagnosis present

## 2011-10-02 DIAGNOSIS — R0989 Other specified symptoms and signs involving the circulatory and respiratory systems: Secondary | ICD-10-CM

## 2011-10-02 DIAGNOSIS — I619 Nontraumatic intracerebral hemorrhage, unspecified: Principal | ICD-10-CM

## 2011-10-02 DIAGNOSIS — G911 Obstructive hydrocephalus: Secondary | ICD-10-CM | POA: Diagnosis present

## 2011-10-02 DIAGNOSIS — I10 Essential (primary) hypertension: Secondary | ICD-10-CM | POA: Diagnosis present

## 2011-10-02 DIAGNOSIS — R4182 Altered mental status, unspecified: Secondary | ICD-10-CM

## 2011-10-02 LAB — GLUCOSE, CAPILLARY
Glucose-Capillary: 129 mg/dL — ABNORMAL HIGH (ref 70–99)
Glucose-Capillary: 138 mg/dL — ABNORMAL HIGH (ref 70–99)
Glucose-Capillary: 134 mg/dL — ABNORMAL HIGH (ref 70–99)

## 2011-10-02 LAB — CBC
HCT: 39.7 % (ref 39.0–52.0)
HCT: 38.1 % — ABNORMAL LOW (ref 39.0–52.0)
Hemoglobin: 13.5 g/dL (ref 13.0–17.0)
Hemoglobin: 12.7 g/dL — ABNORMAL LOW (ref 13.0–17.0)
MCH: 29.6 pg (ref 26.0–34.0)
MCH: 29.3 pg (ref 26.0–34.0)
MCHC: 34 g/dL (ref 30.0–36.0)
MCHC: 33.3 g/dL (ref 30.0–36.0)
MCV: 87.1 fL (ref 78.0–100.0)
MCV: 88 fL (ref 78.0–100.0)
Platelets: 295 10*3/uL (ref 150–400)
Platelets: 312 10*3/uL (ref 150–400)
RBC: 4.56 MIL/uL (ref 4.22–5.81)
RBC: 4.33 MIL/uL (ref 4.22–5.81)
RDW: 13.2 % (ref 11.5–15.5)
RDW: 13.4 % (ref 11.5–15.5)
WBC: 11.4 10*3/uL — ABNORMAL HIGH (ref 4.0–10.5)
WBC: 15.4 10*3/uL — ABNORMAL HIGH (ref 4.0–10.5)

## 2011-10-02 LAB — BASIC METABOLIC PANEL
BUN: 9 mg/dL (ref 6–23)
CO2: 24 mEq/L (ref 19–32)
Calcium: 9.4 mg/dL (ref 8.4–10.5)
Chloride: 102 mEq/L (ref 96–112)
Creatinine, Ser: 0.8 mg/dL (ref 0.50–1.35)
GFR calc Af Amer: >90 mL/min (ref >90)
GFR calc non Af Amer: >90 mL/min (ref >90)
Glucose, Bld: 158 mg/dL — ABNORMAL HIGH (ref 70–99)
Potassium: 4.1 mEq/L (ref 3.5–5.1)
Sodium: 136 mEq/L (ref 135–145)

## 2011-10-02 LAB — COMPREHENSIVE METABOLIC PANEL
ALT: 22 U/L (ref 0–53)
AST: 24 U/L (ref 0–37)
Albumin: 3.5 g/dL (ref 3.5–5.2)
Alkaline Phosphatase: 96 U/L (ref 39–117)
BUN: 12 mg/dL (ref 6–23)
CO2: 24 mEq/L (ref 19–32)
Calcium: 9.7 mg/dL (ref 8.4–10.5)
Chloride: 100 mEq/L (ref 96–112)
Creatinine, Ser: 0.83 mg/dL (ref 0.50–1.35)
GFR calc Af Amer: >90 mL/min (ref >90)
GFR calc non Af Amer: >90 mL/min (ref >90)
Glucose, Bld: 138 mg/dL — ABNORMAL HIGH (ref 70–99)
Potassium: 3.5 mEq/L (ref 3.5–5.1)
Sodium: 136 mEq/L (ref 135–145)
Total Bilirubin: 0.3 mg/dL (ref 0.3–1.2)
Total Protein: 8.6 g/dL — ABNORMAL HIGH (ref 6.0–8.3)

## 2011-10-02 LAB — HEMOGLOBIN A1C
Hgb A1c MFr Bld: 5.6 % (ref <5.7)
Mean Plasma Glucose: 114 mg/dL (ref <117)

## 2011-10-02 LAB — URINE CULTURE
Colony Count: NO GROWTH
Culture: NO GROWTH

## 2011-10-02 LAB — MAGNESIUM: Magnesium: 2 mg/dL (ref 1.5–2.5)

## 2011-10-02 LAB — PHOSPHORUS: Phosphorus: 2.6 mg/dL (ref 2.3–4.6)

## 2011-10-02 MED ORDER — HALOPERIDOL LACTATE 5 MG/ML IJ SOLN
10.0000 mg | INTRAMUSCULAR | Status: DC | PRN
  Administered 2011-10-02: 10 mg via INTRAVENOUS
  Filled 2011-10-02: qty 2

## 2011-10-02 MED ORDER — INSULIN ASPART 100 UNIT/ML ~~LOC~~ SOLN
0.0000 [IU] | SUBCUTANEOUS | Status: DC
Start: 2011-10-02 — End: 2011-10-13
  Administered 2011-10-02 (×3): 2 [IU] via SUBCUTANEOUS
  Administered 2011-10-03: 3 [IU] via SUBCUTANEOUS
  Administered 2011-10-03 – 2011-10-06 (×10): 2 [IU] via SUBCUTANEOUS
  Administered 2011-10-07 (×2): 3 [IU] via SUBCUTANEOUS
  Administered 2011-10-07: 2 [IU] via SUBCUTANEOUS
  Administered 2011-10-08: 3 [IU] via SUBCUTANEOUS
  Administered 2011-10-08 – 2011-10-11 (×2): 2 [IU] via SUBCUTANEOUS

## 2011-10-02 MED ORDER — ACETAMINOPHEN 325 MG PO TABS
650.0000 mg | ORAL_TABLET | Freq: Four times a day (QID) | ORAL | Status: DC | PRN
  Administered 2011-10-02 – 2011-10-17 (×11): 650 mg via ORAL
  Filled 2011-10-02: qty 1
  Filled 2011-10-02 (×10): qty 2

## 2011-10-02 MED ORDER — OXYCODONE-ACETAMINOPHEN 5-325 MG PO TABS
1.0000 | ORAL_TABLET | Freq: Four times a day (QID) | ORAL | Status: DC | PRN
  Administered 2011-10-02: 1 via ORAL
  Filled 2011-10-02: qty 1

## 2011-10-02 MED ORDER — HALOPERIDOL LACTATE 5 MG/ML IJ SOLN
INTRAMUSCULAR | Status: AC
  Filled 2011-10-02: qty 1

## 2011-10-02 MED ORDER — INSULIN ASPART 100 UNIT/ML ~~LOC~~ SOLN: 0.0000 [IU] | Freq: Every day | SUBCUTANEOUS | Status: DC

## 2011-10-02 MED ORDER — ONDANSETRON HCL 4 MG/2ML IJ SOLN
4.0000 mg | Freq: Four times a day (QID) | INTRAMUSCULAR | Status: DC | PRN
  Administered 2011-10-02 – 2011-10-19 (×4): 4 mg via INTRAVENOUS
  Filled 2011-10-02 (×4): qty 2

## 2011-10-02 MED ORDER — DIAZEPAM 5 MG PO TABS
5.0000 mg | ORAL_TABLET | Freq: Once | ORAL | Status: AC
  Administered 2011-10-02: 5 mg via ORAL

## 2011-10-02 MED ORDER — HALOPERIDOL LACTATE 5 MG/ML IJ SOLN
2.0000 mg | Freq: Four times a day (QID) | INTRAMUSCULAR | Status: DC | PRN
  Administered 2011-10-02: 20:00:00 via INTRAVENOUS
  Administered 2011-10-03: 2 mg via INTRAVENOUS
  Filled 2011-10-02 (×2): qty 1

## 2011-10-02 MED ORDER — DIAZEPAM 5 MG PO TABS
ORAL_TABLET | ORAL | Status: AC
  Filled 2011-10-02: qty 1

## 2011-10-02 MED ORDER — LABETALOL HCL 5 MG/ML IV SOLN
10.0000 mg | INTRAVENOUS | Status: DC | PRN
  Administered 2011-10-02 – 2011-10-03 (×3): 10 mg via INTRAVENOUS
  Filled 2011-10-02: qty 8
  Filled 2011-10-02: qty 4

## 2011-10-02 MED ORDER — INSULIN ASPART 100 UNIT/ML ~~LOC~~ SOLN: 0.0000 [IU] | SUBCUTANEOUS | Status: DC

## 2011-10-02 NOTE — Progress Notes (Addendum)
Patient and family made aware of patient's NPO status. Family member requested water for herself and she was reminded that patient was NPO until speech performed a swallow evaluation. Patient started vomiting and family member informed me that she had given the patient her water. Upon auscultation patient's lungs sound rhonchus bilaterally where they were clear at previous assessment. MD notified. Will continue to monitor patient and update as needed.   Wyn Quaker R 10/02/2011 11:41 AM

## 2011-10-02 NOTE — Progress Notes (Signed)
eLink Physician-Brief Progress Note Patient Name: Jeremy [name] DOB: 01-May-1965 MRN: [id number]  Date of Service  10/02/2011   HPI/Events of Note  Patient with ongoing agitation - did not respond to haldol 2 mg IV.  Also with tachycardia with HR in the 140s to 150s on Cardene gtt and hypertension.  Goal BP is systolic BP less than 160.   eICU Interventions  Plan: Haldol at higher dosing for agitation  F/U 12 lead EKG Switch to prn labetalol for BP control due to the side effect of tachycardia with cardene.  Consider labetalol gtt if BP control is less than ideal.   Intervention Category Major Interventions: Delirium, psychosis, severe agitation - evaluation and management Intermediate Interventions: Hypertension - evaluation and management;Arrhythmia - evaluation and management  DETERDING,ELIZABETH 10/02/2011, 11:35 PM

## 2011-10-02 NOTE — Evaluation (Signed)
Speech Language Pathology Evaluation Patient Details Name: Jeremy [name] MRN: [id number] DOB: 1965-04-30 Today's Date: 10/02/2011 Time: 6213-0865 SLP Time Calculation (min): 45 min  Problem List:  Patient Active Problem List  Diagnosis  . ICH (intracerebral hemorrhage)  . HTN (hypertension)  . Hypoxemia  . Altered mental status  . Cytotoxic cerebral edema  . Obstructive hydrocephalus  . Malignant hypertension requiring acute intensive management   Past Medical History:  Past Medical History  Diagnosis Date  . Hypertension   . Stroke    Past Surgical History: History reviewed. No pertinent past surgical history. HPI:  46 y/o male admitted to ED with headache and stroke like symptoms. CT indicates right thalamic hematoma with minimal surrounding edema. Patient referred for SLE per stroke protocol.    Assessment / Plan / Recommendation Clinical Impression  Moderate cognitive deficit in areas of auditory comprehension, safety awareness , attention, problem solving, and organization.  Patient with decreased intelligibility  (baseline) due to fast rate of speech and due to lack of dentition.  Recommend ST treatment in acute care setting to increase safety  and participation  with ADL's in current environment.      SLP Assessment  Patient needs continued Speech Lanaguage Pathology Services    Follow Up Recommendations  Inpatient Rehab    Frequency and Duration min 2x/week  2 weeks      SLP Goals  SLP Goals Potential to Achieve Goals: Good Potential Considerations: Previous level of function;Family/community support Progress/Goals/Alternative treatment plan discussed with pt/caregiver and they: Agree SLP Goal #1: Patient will demonstrate emergent and intellectual awareness with current deficits with moderate verbal and visual cues.  SLP Goal #2: Patient will correctly answer biographical questions 10/10 with moderate verbal and visual cues.  SLP Goal #3: Patient will complete  target problem solving tasks with moderate assist.   SLP Evaluation Prior Functioning  Cognitive/Linguistic Baseline: Within functional limits Type of Home: House Lives With: Spouse Available Help at Discharge: Family;Available 24 hours/day Education: 12th grade education Vocation: Full time employment   Cognition  Overall Cognitive Status: Impaired Arousal/Alertness: Awake/alert Orientation Level: Oriented to person;Oriented to place;Oriented to situation;Disoriented to time Attention: Focused Focused Attention: Impaired Focused Attention Impairment: Verbal basic;Functional basic Memory: Impaired Memory Impairment: Retrieval deficit;Decreased recall of new information;Decreased short term memory Decreased Short Term Memory: Verbal basic;Functional basic Awareness: Impaired Awareness Impairment: Intellectual impairment;Emergent impairment;Anticipatory impairment Problem Solving: Impaired Problem Solving Impairment: Verbal basic;Functional basic Executive Function: Reasoning;Sequencing Reasoning: Impaired Reasoning Impairment: Verbal basic;Functional basic Sequencing: Impaired Sequencing Impairment: Verbal basic;Functional basic Behaviors: Restless;Impulsive Safety/Judgment: Impaired    Comprehension  Auditory Comprehension Overall Auditory Comprehension: Impaired Yes/No Questions: Impaired Basic Biographical Questions: 26-50% accurate Basic Immediate Environment Questions: 25-49% accurate Complex Questions: 0-24% accurate Paragraph Comprehension (via yes/no questions): 0-25% accurate Commands: Impaired Two Step Basic Commands: 25-49% accurate Interfering Components: Attention;Visual impairments;Processing speed;Working Radio broadcast assistant: Dietitian: Not tested Reading Comprehension Reading Status: Not tested    Expression Expression Primary Mode of Expression: Verbal Verbal  Expression Overall Verbal Expression: Appears within functional limits for tasks assessed Written Expression Dominant Hand: Right Written Expression: Not tested   Oral / Motor Oral Motor/Sensory Function Overall Oral Motor/Sensory Function: Impaired Labial ROM: Within Functional Limits Labial Symmetry: Within Functional Limits Labial Strength: Within Functional Limits Labial Sensation: Within Functional Limits Lingual ROM: Within Functional Limits Lingual Symmetry: Within Functional Limits Lingual Strength: Within Functional Limits Lingual Sensation: Within Functional Limits Facial ROM: Within Functional Limits Facial Symmetry: Within Functional Limits Facial Strength: Within  Functional Limits Facial Sensation: Within Functional Limits Velum: Impaired left Mandible: Within Functional Limits Motor Speech Overall Motor Speech: Appears within functional limits for tasks assessed   GO    Moreen Fowler MS, CCC-SLP [phone] [facility] 10/02/2011, 1:56 PM

## 2011-10-02 NOTE — Progress Notes (Signed)
The patient became agitated.   Plan: haldol, soft medical restrains and sitter at the bed site.

## 2011-10-02 NOTE — Progress Notes (Signed)
Stroke Team Progress Note  SUBJECTIVE [name] is a 46 y.o. male  who had been c/o of headaches for the past 7 days, with nausea and vomiting,  On 10/01/11 morning according to his wife he had visual disturbance and then he vomited followed by left sided weakness approximately 7 AM.  Patient arrived to the ED. Head CT was done that showed, Right BG, bleed with some area involved in thalamus. With minimal mass effect.  According to the wife he has stopped taking his BP medications several years ago and does not go the physician as Suggested. Previously, he had significant neurological symptoms with significant elevated BP that could have been attributed to Hypertensive encephalopathy.    He has remained stable overnight in the intensive care unit with blood pressure tightly controlled by IV nicardipine drip. The patient was initially considered for participation in the  [facility] 2 trial and wife signed consent form but unfortunately it was too late to randomize him into the study. Repeat CT head from last night as well as this a.m. shows slight increase in size of the right thalamic hematoma with mild midline shift and hydrocephalus. OBJECTIVE Most recent Vital Signs: Temp: 98.2 F (36.8 C) (08/17 0900) Temp src: Oral (08/17 0900) BP: 136/73 mmHg (08/17 0900) Pulse Rate: 91  (08/17 0900) Respiratory Rate: 42 O2 Saturdation: 93%  CBG (last 3)   Basename 10/01/11 1609 10/01/11 0814  GLUCAP 171* 132*   Intake/Output from previous day: 08/16 0701 - 08/17 0700 In: 3557 [I.V.:3357; IV Piggyback:200] Out: 2650 [Urine:2650]  IV Fluid Intake:     . sodium chloride 100 mL/hr at 10/02/11 0819  . niCARDipine 5.5 mg/hr (10/02/11 0800)   Diet: NPO    Activity: bedrest DVT Prophylaxis:  SCDs  Studies: Results for orders placed during the hospital encounter of 10/01/11 (from the past 24 hour(s))  PROCALCITONIN     Status: Normal   Collection Time   10/01/11  9:51 AM      Component  Value Range   Procalcitonin <0.10    CBC WITH DIFFERENTIAL     Status: Abnormal   Collection Time   10/01/11 10:04 AM      Component Value Range   WBC 6.2  4.0 - 10.5 K/uL   RBC 4.42  4.22 - 5.81 MIL/uL   Hemoglobin 12.9 (*) 13.0 - 17.0 g/dL   HCT 40.9  81.1 - 91.4 %   MCV 88.7  78.0 - 100.0 fL   MCH 29.2  26.0 - 34.0 pg   MCHC 32.9  30.0 - 36.0 g/dL   RDW 78.2  95.6 - 21.3 %   Platelets 262  150 - 400 K/uL   Neutrophils Relative 73  43 - 77 %   Neutro Abs 4.5  1.7 - 7.7 K/uL   Lymphocytes Relative 20  12 - 46 %   Lymphs Abs 1.2  0.7 - 4.0 K/uL   Monocytes Relative 6  3 - 12 %   Monocytes Absolute 0.4  0.1 - 1.0 K/uL   Eosinophils Relative 1  0 - 5 %   Eosinophils Absolute 0.0  0.0 - 0.7 K/uL   Basophils Relative 0  0 - 1 %   Basophils Absolute 0.0  0.0 - 0.1 K/uL  COMPREHENSIVE METABOLIC PANEL     Status: Abnormal   Collection Time   10/01/11 10:04 AM      Component Value Range   Sodium 139  135 - 145 mEq/L   Potassium  4.0  3.5 - 5.1 mEq/L   Chloride 104  96 - 112 mEq/L   CO2 30  19 - 32 mEq/L   Glucose, Bld 127 (*) 70 - 99 mg/dL   BUN 9  6 - 23 mg/dL   Creatinine, Ser 7.82  0.50 - 1.35 mg/dL   Calcium 9.2  8.4 - 95.6 mg/dL   Total Protein 8.6 (*) 6.0 - 8.3 g/dL   Albumin 3.6  3.5 - 5.2 g/dL   AST 23  0 - 37 U/L   ALT 23  0 - 53 U/L   Alkaline Phosphatase 104  39 - 117 U/L   Total Bilirubin 0.2 (*) 0.3 - 1.2 mg/dL   GFR calc non Af Amer >90  >90 mL/min   GFR calc Af Amer >90  >90 mL/min  APTT     Status: Normal   Collection Time   10/01/11 10:04 AM      Component Value Range   aPTT 30  24 - 37 seconds  PROTIME-INR     Status: Normal   Collection Time   10/01/11 10:04 AM      Component Value Range   Prothrombin Time 14.3  11.6 - 15.2 seconds   INR 1.09  0.00 - 1.49  MAGNESIUM     Status: Normal   Collection Time   10/01/11 10:04 AM      Component Value Range   Magnesium 2.1  1.5 - 2.5 mg/dL  PHOSPHORUS     Status: Abnormal   Collection Time   10/01/11 10:04 AM       Component Value Range   Phosphorus 1.5 (*) 2.3 - 4.6 mg/dL  MRSA PCR SCREENING     Status: Normal   Collection Time   10/01/11 10:11 AM      Component Value Range   MRSA by PCR NEGATIVE  NEGATIVE  PRO B NATRIURETIC PEPTIDE     Status: Normal   Collection Time   10/01/11 10:13 AM      Component Value Range   Pro B Natriuretic peptide (BNP) 36.5  0 - 125 pg/mL  BLOOD GAS, ARTERIAL     Status: Abnormal   Collection Time   10/01/11 11:02 AM      Component Value Range   O2 Content 2.0     Delivery systems NASAL CANNULA     pH, Arterial 7.345 (*) 7.350 - 7.450   pCO2 arterial 51.0 (*) 35.0 - 45.0 mmHg   pO2, Arterial 99.3  80.0 - 100.0 mmHg   Bicarbonate 27.1 (*) 20.0 - 24.0 mEq/L   TCO2 28.6  0 - 100 mmol/L   Acid-Base Excess 1.9  0.0 - 2.0 mmol/L   O2 Saturation 98.5     Patient temperature 98.6     Collection site RIGHT RADIAL     Drawn by [id number]     Sample type ARTERIAL DRAW     Allens test (pass/fail) PASS  PASS  ANTITHROMBIN III     Status: Normal   Collection Time   10/01/11 11:44 AM      Component Value Range   AntiThromb III Func 105  75 - 120 %  HOMOCYSTEINE, SERUM     Status: Normal   Collection Time   10/01/11 11:44 AM      Component Value Range   Homocysteine-Norm 9.7  4.0 - 15.4 umol/L  GLUCOSE, CAPILLARY     Status: Abnormal   Collection Time   10/01/11  4:09 PM  Component Value Range   Glucose-Capillary 171 (*) 70 - 99 mg/dL   Comment 1 Documented in Chart     Comment 2 Notify RN    BLOOD GAS, ARTERIAL     Status: Abnormal   Collection Time   10/01/11  5:50 PM      Component Value Range   O2 Content 2.0     Delivery systems NASAL CANNULA     pH, Arterial 7.403  7.350 - 7.450   pCO2 arterial 41.3  35.0 - 45.0 mmHg   pO2, Arterial 84.1  80.0 - 100.0 mmHg   Bicarbonate 25.3 (*) 20.0 - 24.0 mEq/L   TCO2 26.5  0 - 100 mmol/L   Acid-Base Excess 1.0  0.0 - 2.0 mmol/L   O2 Saturation 96.8     Patient temperature 98.6     Collection site LEFT RADIAL       Drawn by [id number]     Sample type ARTERIAL DRAW     Allens test (pass/fail) PASS  PASS  CBC     Status: Abnormal   Collection Time   10/02/11  5:15 AM      Component Value Range   WBC 11.4 (*) 4.0 - 10.5 K/uL   RBC 4.56  4.22 - 5.81 MIL/uL   Hemoglobin 13.5  13.0 - 17.0 g/dL   HCT 84.6  96.2 - 95.2 %   MCV 87.1  78.0 - 100.0 fL   MCH 29.6  26.0 - 34.0 pg   MCHC 34.0  30.0 - 36.0 g/dL   RDW 84.1  32.4 - 40.1 %   Platelets 295  150 - 400 K/uL  BASIC METABOLIC PANEL     Status: Abnormal   Collection Time   10/02/11  5:15 AM      Component Value Range   Sodium 136  135 - 145 mEq/L   Potassium 4.1  3.5 - 5.1 mEq/L   Chloride 102  96 - 112 mEq/L   CO2 24  19 - 32 mEq/L   Glucose, Bld 158 (*) 70 - 99 mg/dL   BUN 9  6 - 23 mg/dL   Creatinine, Ser 0.27  0.50 - 1.35 mg/dL   Calcium 9.4  8.4 - 25.3 mg/dL   GFR calc non Af Amer >90  >90 mL/min   GFR calc Af Amer >90  >90 mL/min  MAGNESIUM     Status: Normal   Collection Time   10/02/11  5:15 AM      Component Value Range   Magnesium 2.0  1.5 - 2.5 mg/dL  PHOSPHORUS     Status: Normal   Collection Time   10/02/11  5:15 AM      Component Value Range   Phosphorus 2.6  2.3 - 4.6 mg/dL     Ct Head Wo Contrast  10/02/2011  *RADIOLOGY REPORT*  Clinical Data: History of intracranial hemorrhage with increased headache and left-sided weakness.  CT HEAD WITHOUT CONTRAST  Technique:  Contiguous axial images were obtained from the base of the skull through the vertex without contrast.  Comparison: 10/01/2011  Findings: Acute intraparenchymal hematoma in the right thalamic region measuring 2.2 x 3.9 cm.  There is mild surrounding edema. The hematoma measures slightly larger than the previous study. Mild focal midline shift of about 2 mm, similar to previous study. Increased density material in the third and lateral ventricles consistent with intraventricular hemorrhage.  Mild ventricular dilatation is stable.  No abnormal extra-axial fluid collections.  No new focus  of hemorrhage is demonstrated.  Examination is otherwise unchanged.  IMPRESSION: Right thalamic hematoma appears slightly enlarged since previous study with mild surrounding edema.  Intraventricular hemorrhage and mild ventricular dilatation is stable.  Original Report Authenticated By: Marlon Pel, M.D.   Ct Head Wo Contrast  10/01/2011  *RADIOLOGY REPORT*  Clinical Data: Intracranial hemorrhage.  Increased headache and left-sided weakness  CT HEAD WITHOUT CONTRAST  Technique:  Contiguous axial images were obtained from the base of the skull through the vertex without contrast.  Comparison: CT 10/01/2011  Findings: Right thalamic hemorrhage measures 16 x 34 mm and is unchanged.  There is mild intraventricular hemorrhage also unchanged.  .  No new area of hemorrhage.  No acute infarct or mass lesion.  Lateral ventricles are minimally larger.  Temporal horns are slightly larger.  IMPRESSION: Right thalamic hemorrhage with interventricular extension is unchanged.  No new hemorrhage.  Mild dilatation of the temporal horns compatible with early hydrocephalus.  Original Report Authenticated By: Camelia Phenes, M.D.   Ct Head Wo Contrast  10/01/2011  *RADIOLOGY REPORT*  Clinical Data: Code stroke, fell on the way to the bathroom today, left-sided weakness  CT HEAD WITHOUT CONTRAST  Technique:  Contiguous axial images were obtained from the base of the skull through the vertex without contrast.  Comparison: CT brain scan of 08/05/2007  Findings: There is an acute bleed within the right basilar ganglia primarily from the region of the right thalamus.  Very little surrounding edema is seen.  This basal ganglial hemorrhage measures 3.7 x 1.9 cm with very little mass effect. There is a small amount of acute blood within the third ventricle, but no ventricular dilatation is seen currently.  Otherwise the ventricular system is unremarkable and the septum is midline in position.  There is mild small  vessel ischemic change present.  On bone window images, no acute bony abnormality is seen.  The paranasal sinuses that are visualized are clear.  IMPRESSION:  1.  Acute right basal ganglial hemorrhage with only minimal surrounding edema and very little mass effect. 2.  A small amount of blood is noted within the nondilated third ventricle.  The ventricular system is normal in size currently with no mass effect evident.  Critical Value/emergent results were called by telephone at the time of interpretation on 10/01/2011 at 7:57 a.m. to Dr. Preston Fleeting, who verbally acknowledged these results.  Original Report Authenticated By: Juline Patch, M.D.   Dg Chest Portable 1 View  10/01/2011  *RADIOLOGY REPORT*  Clinical Data: Stroke/shortness of breath  PORTABLE CHEST - 1 VIEW  Comparison: None.  Findings: Lungs are hypoinflated without consolidation or effusion. There is mild cardiomegaly.  There is mild spondylosis of the spine.  IMPRESSION:  No acute disease.  Mild cardiomegaly.  Original Report Authenticated By: Elba Barman, M.D.    Physical Exam:   Pleasant middle-aged [place] American gentleman who appears currently not to be in distress.  Awake alert. Afebrile. Head is nontraumatic. Neck is supple without bruit. Hearing is normal. Cardiac exam no murmur or gallop. Lungs are clear to auscultation. Distal pulses are well felt.  Neurological Exam :   Awake alert oriented x3 with normal speech and language. Visual acuity seems adequate. There is dense left homonymous hemianopsia. Mild right gaze preference with skew eye deviation with right eye downward and rightward deviated. Vertical gaze paralysis. Pupils 4 mm sluggishly reactive. Mild left lower facial weakness. Tongue is midline. Significant left hemiparesis with 3/5 strength in the  left approximately and 2/5 strength in the left lower extremity. Diminished sensation on the left. Left plantar is upgoing. Right is downgoing. Coordination is impaired on the  left. Gait was not tested. ASSESSMENT [name] is a 46 y.o. male with hypertensive right thalamic intracerebral hemorrhage with intraventricular extension with mild hydrocephalus and cytotoxic cerebral edema. History of medication noncompliance. Malignant hypertension  Hospital day # 1  TREATMENT/PLAN Aggressive blood pressure control. Continue nicardipine drip. Check swallow eval and if  patient passes start him on oral blood pressure medications and wean nicardipine drip. Appreciate neurosurgery input. I agree the patient does not need ventriculostomy at the current time. I had a long discussion with the patient and his wife and other family members yesterday regarding his condition, prognosis, plan of care and answered questions.  This patient is critically ill and at significant risk of neurological worsening, death and care requires constant monitoring of vital signs, hemodynamics,respiratory and cardiac monitoring,review of multiple databases, neurological assessment, discussion with family, other specialists and medical decision making of high complexity. I spent 30 minutes of neurocritical care time  in the care of  this patient.  Delia Heady, MD Medical Director [facility] Pager: [phone] 10/02/2011 9:53 AM   Gates Rigg, MD [facility] Pager: [phone] 10/02/2011 9:41 AM

## 2011-10-02 NOTE — Progress Notes (Signed)
Headache  Percocet ordered for prn use.

## 2011-10-02 NOTE — Evaluation (Signed)
Clinical/Bedside Swallow Evaluation Patient Details  Name: Jeremy [name] MRN: [id number] Date of Birth: 1966-02-09  Today's Date: 10/02/2011 Time: 1130-1200 SLP Time Calculation (min): 30 min  Past Medical History:  Past Medical History  Diagnosis Date  . Hypertension   . Stroke    Past Surgical History: History reviewed. No pertinent past surgical history. HPI:  46 y/o male presented to ED with headache and stroke like symptoms.  CT indicates right thalamic hematoma with minimal surrounding edema.  Patient referred for BSE per Stroke Protocol.     Assessment / Plan / Recommendation Clinical Impression  Minimal oropharyngeal sensory based dysphagia marked by slight delay in initiation.  Delayed throat clears noted s/p swallow of thin liquid administered by straw indicating possible penetration to cords due to decreased initiation.  Recommend to  proceed with regular consistency as tolerated ( RN reports emesis prior to BSE and initiating clear liquid diet)  and thin liquids  wiith full supervision with all meals secondary to noted decreased safety awareness.  ST to follow in acute care setting for diet tolerance.    Aspiration Risk  Moderate      Diet Recommendation Regular;Thin liquid   Liquid Administration via: Cup;No straw Medication Administration: Whole meds with liquid Supervision: Patient able to self feed;Full supervision/cueing for compensatory strategies Compensations: Slow rate;Small sips/bites;Clear throat intermittently Postural Changes and/or Swallow Maneuvers: Seated upright 90 degrees;Upright 30-60 min after meal    Other  Recommendations Oral Care Recommendations: Oral care QID Other Recommendations: Clarify dietary restrictions   Follow Up Recommendations  Inpatient Rehab    Frequency and Duration min 2x/week  2 weeks       SLP Swallow Goals Patient will consume recommended diet without observed clinical signs of aspiration with:  Supervision/safety Patient will utilize recommended strategies during swallow to increase swallowing safety with: Supervision/safety   Swallow Study Prior Functional Status   Consumed regular consistency and thin liquids with no prior reports of dysphagia     General Date of Onset: 10/01/11 HPI: 46 y/o male presented to ED with headache and stroke like symptoms.  CT indicates right thalamic hematoma with minimal surrounding edema.  Patient referred for BSE per Stroke Protocol.   Type of Study: Bedside swallow evaluation Previous Swallow Assessment: No prior reports of dysphagia  Diet Prior to this Study: NPO Temperature Spikes Noted: No Respiratory Status: Supplemental O2 delivered via (comment) History of Recent Intubation: No Behavior/Cognition: Alert;Cooperative;Pleasant mood;Distractible;Requires cueing Oral Cavity - Dentition: Missing dentition;Poor condition Self-Feeding Abilities: Needs assist Patient Positioning: Upright in bed Baseline Vocal Quality: Clear Volitional Cough: Strong Volitional Swallow: Able to elicit    Oral/Motor/Sensory Function Overall Oral Motor/Sensory Function: Impaired Labial ROM: Within Functional Limits Labial Symmetry: Within Functional Limits Labial Strength: Within Functional Limits Labial Sensation: Within Functional Limits Lingual ROM: Within Functional Limits Lingual Symmetry: Within Functional Limits Lingual Strength: Within Functional Limits Lingual Sensation: Within Functional Limits Facial ROM: Within Functional Limits Facial Symmetry: Within Functional Limits Facial Strength: Within Functional Limits Facial Sensation: Within Functional Limits Velum: Impaired left Mandible: Within Functional Limits   Ice Chips Ice chips: Within functional limits Presentation: Self Fed   Thin Liquid Thin Liquid: Impaired Presentation: Cup;Straw Pharyngeal  Phase Impairments: Throat Clearing - Delayed Other Comments: Throat clears noted with thin  liquid by straw    Nectar Thick Nectar Thick Liquid: Not tested   Honey Thick Honey Thick Liquid: Not tested   Puree Puree: Within functional limits   Solid     Moreen Fowler MS,  CCC-SLP [phone]  Solid: Within functional limits       Fern Asmar 10/02/2011,1:23 PM

## 2011-10-02 NOTE — Evaluation (Signed)
Physical Therapy Evaluation Patient Details Name: Jeremy [name] MRN: [id number] DOB: [birth year] Today's Date: 10/02/2011 Time: 3244-0102 PT Time Calculation (min): 40 min  PT Assessment / Plan / Recommendation Clinical Impression  Pt admitted with CT eval demonstrated R basal banglia hemorrhage with minimal surrounding edema. Pt with safety concerns including problem solving as well as impulsiveness and decreased awareness of deficits. Pt with L hemiplegia, minimal movement. Pt will benefit from skilled PT in the acute care setting in order to maximize functional mobility, strength and safety.    PT Assessment  Patient needs continued PT services    Follow Up Recommendations  Inpatient Rehab;Supervision/Assistance - 24 hour    Barriers to Discharge        Equipment Recommendations  Defer to next venue    Recommendations for Other Services     Frequency Min 4X/week    Precautions / Restrictions Precautions Precautions: Fall Restrictions Weight Bearing Restrictions: No         Mobility  Bed Mobility Bed Mobility: Supine to Sit;Sitting - Scoot to [employer] of Bed;Sit to Supine;Rolling Right;Rolling Left Rolling Right: 2: Max assist Rolling Left: 3: Mod assist Supine to Sit: 1: +2 Total assist;With rails Supine to Sit: Patient Percentage: 30% Sitting - Scoot to Edge of Bed: 2: Max assist Sit to Supine: 1: +2 Total assist Sit to Supine: Patient Percentage: 20% Details for Bed Mobility Assistance: VC for sequencing. Pt unable to move L side at all requiring assistance with UE and LE as well as support through trunk. Pt large stature requiring increased assistance for safety. Pt able to complete majority of roll with assistance for support and control as pt extremely impulsive during mobility. Pt required increased cueing throughout transfer for safety Transfers Transfers: Not assessed    Exercises     PT Diagnosis: Hemiplegia non-dominant side;Difficulty walking  PT Problem  List: Decreased strength;Decreased activity tolerance;Decreased balance;Decreased mobility;Decreased knowledge of use of DME;Decreased safety awareness;Decreased knowledge of precautions;Pain PT Treatment Interventions: DME instruction;Gait training;Functional mobility training;Therapeutic activities;Therapeutic exercise;Balance training;Neuromuscular re-education;Cognitive remediation;Patient/family education   PT Goals Acute Rehab PT Goals PT Goal Formulation: With patient Time For Goal Achievement: 10/16/11 Potential to Achieve Goals: Fair Pt will go Supine/Side to Sit: with supervision PT Goal: Supine/Side to Sit - Progress: Goal set today Pt will Sit at Edge of Bed: with supervision;3-5 min;with bilateral upper extremity support PT Goal: Sit at Edge Of Bed - Progress: Goal set today Pt will go Sit to Supine/Side: with supervision PT Goal: Sit to Supine/Side - Progress: Goal set today Pt will go Sit to Stand: with mod assist PT Goal: Sit to Stand - Progress: Goal set today Pt will go Stand to Sit: with mod assist PT Goal: Stand to Sit - Progress: Goal set today Pt will Transfer Bed to Chair/Chair to Bed: with max assist PT Transfer Goal: Bed to Chair/Chair to Bed - Progress: Goal set today Pt will Stand: with mod assist;1 - 2 min;with bilateral upper extremity support PT Goal: Stand - Progress: Goal set today  Visit Information  Last PT Received On: 10/02/11 Assistance Needed: +2    Subjective Data  Patient Stated Goal: none given   Prior Functioning  Home Living Lives With: Spouse Available Help at Discharge: Family;Available 24 hours/day Type of Home: House Home Access: Stairs to enter [employer] of Steps: 2 Entrance Stairs-Rails: None Home Layout: One level Bathroom Shower/Tub: Tub/shower unit;Curtain;Door [employer] Toilet: Standard Bathroom Accessibility: Yes How Accessible: Accessible via walker Home Adaptive Equipment: None Prior Function  Level of  Independence: Independent Able to Take Stairs?: Yes Driving: Yes 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 Pt with random comments throughout session. Pt with partial neglect of LLE as well problems with all problem solving    Extremity/Trunk Assessment Right Lower Extremity Assessment RLE ROM/Strength/Tone: Within functional levels RLE Sensation: WFL - Light Touch Left Lower Extremity Assessment LLE ROM/Strength/Tone: Deficits LLE ROM/Strength/Tone Deficits: Pt unable to flex hip. He is able to minimally extend and flex knee, unable to achieve full ROM or maintain any resistance. DF/PF 2/5 LLE Sensation: Deficits LLE Sensation Deficits: Pt unable to feel light touch except for top of thigh. Pt able to feel pressure throughout LLE, can feel clonus as well when occuring(RN in room and aware) Trunk Assessment Trunk Assessment: Normal   Balance Balance Balance Assessed: Yes Static Sitting Balance Static Sitting - Balance Support: Bilateral upper extremity supported;Feet supported Static Sitting - Level of Assistance: 4: Min assist Static Sitting - Comment/# of Minutes: Pt able to sit at EOB for ~10 minutes with feet support and UE assist. When pt not holding on with RUE, he required max cueing for upright posture and stability. Pt impulsive during movement, attempting to get up even though LLE with no control  End of Session PT - End of Session Equipment Utilized During Treatment: Oxygen Activity Tolerance: Patient tolerated treatment well Patient left: in bed;with call bell/phone within reach;with nursing in  room Nurse Communication: Mobility status;Other (comment) (pts need for hygiene)    Milana Kidney 10/02/2011, 6:34 PM  10/02/2011 Milana Kidney DPT PAGER: [phone] OFFICE: [phone]

## 2011-10-02 NOTE — Progress Notes (Signed)
VASCULAR LAB PRELIMINARY  PRELIMINARY  PRELIMINARY  PRELIMINARY  Carotid Dopplers completed.    Preliminary report: Right: no ICA stenosis.  Vertebral artery flow is antegrade.  Left: No proximal ICA stenosis.  Unable to visualize distal ICA and vertebral artery secondary to patient body habitus and breathing.  Jeremy [name], 10/02/2011, 11:43 AM

## 2011-10-02 NOTE — Progress Notes (Signed)
Name: Jeremy [name] MRN: [id number] DOB: [birth year]    LOS: 1  Referring Provider:  Neuro Reason for Referral:  CVA    PULMONARY / CRITICAL CARE MEDICINE  HPI:  46 yo AAM with PMH of HTN presented to [facility] ED on 8/16 via EMS as a code stroke.  He woke up at 7 am and was unable to ambulate to bathroom, use left hand and was incontinent of urine.  He apparently woke several times during the night to urinate (last at 4am).  He noted right sided headache and vomited in the ambulance.  CT eval demonstrated R basal banglia hemorrhage with minimal surrounding edema.  PCCM consulted for ICU mgmt.     Brief patient description:  46 y/o AAM with HTN admitted with R basal ganglia hemorrhage.   Events Since Admission: 8/16 Admit with basal ganglia hemorrhage  Subjective: feels well this morning, slight headache   Vital Signs: Temp:  [98.1 F (36.7 C)-99.1 F (37.3 C)] 98.2 F (36.8 C) (08/17 0900) Pulse Rate:  [75-135] 95  (08/17 0930) Resp:  [18-42] 42  (08/16 2100) BP: (120-170)/(65-125) 131/81 mmHg (08/17 0930) SpO2:  [90 %-99 %] 95 % (08/17 0930) Weight:  [128.3 kg (282 lb 13.6 oz)] 128.3 kg (282 lb 13.6 oz) (08/17 0200)  Physical Examination:  Gen: lying comfortably in bed HEENT: NCAT, PERRL, EOMi,  PULM: CTA B CV: RRR, no mgr, no JVD AB: BS+, soft, nontender, no hsm Ext: warm, no edema, no clubbing, no cyanosis Derm: no rash or skin breakdown Neuro: A&Ox4, left facial droop, cannot move L arm/leg   Principal Problem:  *Malignant hypertension requiring acute intensive management Active Problems:  ICH (intracerebral hemorrhage)  HTN (hypertension)  Hypoxemia  Altered mental status  Cytotoxic cerebral edema  Obstructive hydrocephalus   ASSESSMENT AND PLAN  NEUROLOGIC 8/17 CT Head >> R thalamic hematoma slightly enlarged with surrounding edema; 2mm midline shift A:   Acute R Basal Ganglia Hemorrhage -hemorrhage with small amt edema  P:   -appreciate Neurology and  NSGY, supportive care, tight BP control for now -neuro checks -f/u speech eval so can convert to oral BP meds -further imaging per Neurology -seizure precautions -decadron   PULMONARY  Lab 10/01/11 1750 10/01/11 1102  PHART 7.403 7.345*  PCO2ART 41.3 51.0*  PO2ART 84.1 99.3  HCO3 25.3* 27.1*  O2SAT 96.8 98.5   Ventilator Settings:   CXR:  8/16 no acute disease ETT:    A:   No acute issues  P:   -supportive care, O2 to keep sats -aspiration precautions -monitor respiratory status closely with CVA  CARDIOVASCULAR  Lab 10/01/11 1013 10/01/11 0742  TROPONINI -- <0.30  LATICACIDVEN -- --  PROBNP 36.5 --   ECG:  SR  Lines:  8/16 TTE >> limited study, LVEF 60-70%  A: HTN  P:  -cardene gtt  -convert to po meds when passes Speech eval  RENAL  Lab 10/02/11 0515 10/01/11 1004 10/01/11 0757 10/01/11 0742  NA 136 139 143 138  K 4.1 4.0 -- --  CL 102 104 105 103  CO2 24 30 -- 25  BUN 9 9 10 11   CREATININE 0.80 0.90 1.10 0.92  CALCIUM 9.4 9.2 -- 9.3  MG 2.0 2.1 -- --  PHOS 2.6 1.5* -- --   Intake/Output      08/16 0701 - 08/17 0700 08/17 0701 - 08/18 0700   I.V. (mL/kg) 3357 (26.2) 335 (2.6)   IV Piggyback 200    Total Intake(mL/kg) 3557 (27.7)  335 (2.6)   Urine (mL/kg/hr) 2650 (0.9) 225   Total Output 2650 225   Net +907 +110         Foley:  8/16>>>  A:   No acute issues  P:   -trend BMP  GASTROINTESTINAL  Lab 10/01/11 1004 10/01/11 0742  AST 23 24  ALT 23 23  ALKPHOS 104 109  BILITOT 0.2* 0.2*  PROT 8.6* 8.5*  ALBUMIN 3.6 3.5    A:   NPO Rule out Dysphagia  P:   -NPO until Speech Eval  HEMATOLOGIC  Lab 10/02/11 0515 10/01/11 1004 10/01/11 0757 10/01/11 0742  HGB 13.5 12.9* 14.3 13.3  HCT 39.7 39.2 42.0 39.8  PLT 295 262 -- 265  INR -- 1.09 -- 1.04  APTT -- 30 -- 31   A:   No acute issues  P:  -monitor cbc, PT/INR  INFECTIOUS  Lab 10/02/11 0515 10/01/11 1004 10/01/11 0951 10/01/11 0742  WBC 11.4* 6.2 -- 6.2    PROCALCITON -- -- <0.10 --   Cultures:  Antibiotics:   A:   No acute infectious process  P:   -monitor leukocytosis, fever  ENDOCRINE  Lab 10/01/11 1609 10/01/11 0814  GLUCAP 171* 132*   A:   Hyperglycemia  P:   -SSI CBG for tighter control with CVA -check A1c   BEST PRACTICE / DISPOSITION Level of Care:  ICU Primary Service:  PCCM Consultants:  Neurology Code Status:  Full Diet:  NPO DVT Px:  SCD's GI Px:  Not indicated  Skin Integrity:  intact Social / Family:  None available  Yolonda Kida PCCM Pager: [phone] Cell: [phone] If no response, call [phone]

## 2011-10-03 ENCOUNTER — Inpatient Hospital Stay (HOSPITAL_COMMUNITY): Payer: BC Managed Care – PPO

## 2011-10-03 DIAGNOSIS — I10 Essential (primary) hypertension: Secondary | ICD-10-CM

## 2011-10-03 LAB — CARDIAC PANEL(CRET KIN+CKTOT+MB+TROPI)
CK, MB: 10.4 ng/mL (ref 0.3–4.0)
Relative Index: 2.6 — ABNORMAL HIGH (ref 0.0–2.5)
Total CK: 393 U/L — ABNORMAL HIGH (ref 7–232)
Troponin I: <0.3 ng/mL (ref <0.30)

## 2011-10-03 LAB — GLUCOSE, CAPILLARY
Glucose-Capillary: 106 mg/dL — ABNORMAL HIGH (ref 70–99)
Glucose-Capillary: 115 mg/dL — ABNORMAL HIGH (ref 70–99)
Glucose-Capillary: 118 mg/dL — ABNORMAL HIGH (ref 70–99)
Glucose-Capillary: 138 mg/dL — ABNORMAL HIGH (ref 70–99)
Glucose-Capillary: 157 mg/dL — ABNORMAL HIGH (ref 70–99)

## 2011-10-03 MED ORDER — HYDRALAZINE HCL 20 MG/ML IJ SOLN
INTRAMUSCULAR | Status: AC
  Filled 2011-10-03: qty 1

## 2011-10-03 MED ORDER — HYDROCHLOROTHIAZIDE 25 MG PO TABS
25.0000 mg | ORAL_TABLET | Freq: Every day | ORAL | Status: DC
  Administered 2011-10-03 – 2011-10-04 (×2): 25 mg via ORAL
  Filled 2011-10-03 (×3): qty 1

## 2011-10-03 MED ORDER — ATORVASTATIN CALCIUM 20 MG PO TABS
20.0000 mg | ORAL_TABLET | Freq: Every day | ORAL | Status: DC
  Administered 2011-10-04: 20 mg via ORAL
  Filled 2011-10-03 (×3): qty 1

## 2011-10-03 MED ORDER — AMLODIPINE BESYLATE 5 MG PO TABS
5.0000 mg | ORAL_TABLET | Freq: Every day | ORAL | Status: DC
  Administered 2011-10-03: 5 mg via ORAL
  Filled 2011-10-03 (×2): qty 1

## 2011-10-03 MED ORDER — HALOPERIDOL LACTATE 5 MG/ML IJ SOLN
2.0000 mg | INTRAMUSCULAR | Status: DC | PRN
  Administered 2011-10-03 (×2): 2 mg via INTRAVENOUS
  Filled 2011-10-03: qty 1

## 2011-10-03 MED ORDER — HYDRALAZINE HCL 20 MG/ML IJ SOLN
10.0000 mg | INTRAMUSCULAR | Status: DC | PRN
  Administered 2011-10-03 – 2011-10-18 (×5): 10 mg via INTRAVENOUS
  Filled 2011-10-03 (×2): qty 1
  Filled 2011-10-03: qty 0.5
  Filled 2011-10-03 (×5): qty 1

## 2011-10-03 NOTE — Progress Notes (Signed)
   Pt extremely agitated/restless. Frequently attempting to get out of bed, shaking bed rails.  HR sustained >130s.  MD contacted. Dr. Darrick Penna with [facility] returned page.  Orders clarified: 10mg  Haldol IV  confirmed verbally as well as in MAR. EKG to be done in AM.  Will continue to monitor pt. Arneta Cliche RN

## 2011-10-03 NOTE — Progress Notes (Signed)
Name: Jeremy [name] MRN: [id number] DOB: 03/19/1965    LOS: 2  Referring Provider:  Neuro Reason for Referral:  CVA    PULMONARY / CRITICAL CARE MEDICINE  HPI:  46 yo AAM with PMH of HTN presented to [facility] ED on 8/16 via EMS as a code stroke.  He woke up at 7 am and was unable to ambulate to bathroom, use left hand and was incontinent of urine.  He apparently woke several times during the night to urinate (last at 4am).  He noted right sided headache and vomited in the ambulance.  CT eval demonstrated R basal banglia hemorrhage with minimal surrounding edema.  PCCM consulted for ICU mgmt.     Brief patient description:  46 y/o AAM with HTN admitted with R basal ganglia hemorrhage.   Events Since Admission: 8/16 Admit with basal ganglia hemorrhage  Subjective:  Extremely agitated overnight, received haldol 10mg  IV at midnight   Vital Signs: Temp:  [98.4 F (36.9 C)-99.1 F (37.3 C)] 99.1 F (37.3 C) (08/18 0400) Pulse Rate:  [77-137] 96  (08/18 0600) BP: (128-168)/(41-94) 141/80 mmHg (08/18 0600) SpO2:  [92 %-99 %] 98 % (08/18 0600) Weight:  [285 lb 15 oz (129.7 kg)] 285 lb 15 oz (129.7 kg) (08/18 0500)  Physical Examination:  Gen: lying comfortably in bed HEENT: NCAT, PERRL, EOMi,  PULM: CTA B CV: RRR, no mgr, no JVD AB: BS+, soft, nontender, no hsm Ext: warm, no edema, no clubbing, no cyanosis Derm: no rash or skin breakdown Neuro: somnolent post haldol, difficult to arouse, does withdraw slightly to pain, L sided weakness per family   Principal Problem:  *Malignant hypertension requiring acute intensive management Active Problems:  ICH (intracerebral hemorrhage)  HTN (hypertension)  Hypoxemia  Altered mental status  Cytotoxic cerebral edema  Obstructive hydrocephalus   ASSESSMENT AND PLAN  NEUROLOGIC 8/17 CT Head >> R thalamic hematoma slightly enlarged with surrounding edema; 2mm midline shift MRI brain 8/18>>>  A:   Acute R Basal Ganglia Hemorrhage  -hemorrhage with small amt edema  P:   -appreciate Neurology and NSGY, supportive care, tight BP control for now -neuro checks -f/u speech eval so can convert to oral BP meds - hold for now with lethargy post haldol  -further imaging per Neurology -seizure precautions -decadron -d/c sedating meds for now  PULMONARY  Lab 10/01/11 1750 10/01/11 1102  PHART 7.403 7.345*  PCO2ART 41.3 51.0*  PO2ART 84.1 99.3  HCO3 25.3* 27.1*  O2SAT 96.8 98.5   Ventilator Settings:   CXR:  8/16 no acute disease ETT:    A:   No acute issues  P:   -supportive care, O2 to keep sats -aspiration precautions -monitor respiratory status closely with CVA and lethargy 8/18 post haldol   CARDIOVASCULAR  Lab 10/02/11 2206 10/01/11 1013 10/01/11 0742  TROPONINI <0.30 -- <0.30  LATICACIDVEN -- -- --  PROBNP -- 36.5 --   ECG:  SR  Lines:  8/16 TTE >> limited study, LVEF 60-70%  A: HTN  P:  -Cont PO anti-HTN  -PRN labetalol  -will add PRN hydralazine for now with unable to take PO and marginal HR for further labetalol   RENAL  Lab 10/02/11 2206 10/02/11 0515 10/01/11 1004 10/01/11 0757 10/01/11 0742  NA 136 136 139 143 138  K 3.5 4.1 -- -- --  CL 100 102 104 105 103  CO2 24 24 30  -- 25  BUN 12 9 9 10 11   CREATININE 0.83 0.80 0.90 1.10 0.92  CALCIUM 9.7 9.4 9.2 -- 9.3  MG -- 2.0 2.1 -- --  PHOS -- 2.6 1.5* -- --    Intake/Output Summary (Last 24 hours) at 10/03/11 1103 Last data filed at 10/03/11 4098  Gross per 24 hour  Intake   2649 ml  Output   1725 ml  Net    924 ml    Foley:  8/16>>>  A:   No acute issues  P:   -trend BMP  GASTROINTESTINAL  Lab 10/02/11 2206 10/01/11 1004 10/01/11 0742  AST 24 23 24   ALT 22 23 23   ALKPHOS 96 104 109  BILITOT 0.3 0.2* 0.2*  PROT 8.6* 8.6* 8.5*  ALBUMIN 3.5 3.6 3.5    A:   NPO Rule out Dysphagia  P:   -passed swallow eval - hold PO's while lethargic this am   HEMATOLOGIC  Lab 10/02/11 2206 10/02/11 0515 10/01/11  1004 10/01/11 0757 10/01/11 0742  HGB 12.7* 13.5 12.9* 14.3 13.3  HCT 38.1* 39.7 39.2 42.0 39.8  PLT 312 295 262 -- 265  INR -- -- 1.09 -- 1.04  APTT -- -- 30 -- 31   A:   No acute issues  P:  -monitor cbc, PT/INR  INFECTIOUS  Lab 10/02/11 2206 10/02/11 0515 10/01/11 1004 10/01/11 0951 10/01/11 0742  WBC 15.4* 11.4* 6.2 -- 6.2  PROCALCITON -- -- -- <0.10 --   Cultures:  Antibiotics:   A:   No acute infectious process  P:   -wbc trending up but remains afebrile  -cont monitor   ENDOCRINE  Lab 10/03/11 0437 10/03/11 0057 10/02/11 2039 10/02/11 1634 10/02/11 1232  GLUCAP 106* 157* 134* 138* 129*   A:   Hyperglycemia  P:   -SSI CBG for tighter control with CVA -check A1c   BEST PRACTICE / DISPOSITION Level of Care:  ICU Primary Service:  PCCM Consultants:  Neurology Code Status:  Full Diet:  NPO DVT Px:  SCD's GI Px:  Not indicated  Skin Integrity:  intact Social / Family:  Updated at bedside  [facility], NP 10/03/2011  10:51 AM Pager: [phone] or [phone]  *Care during the described time interval was provided by me and/or other providers on the critical care team. I have reviewed this patient's available data, including medical history, events of note, physical examination and test results as part of my evaluation.   Attending:  I have seen and examined the patient with nurse practitioner/resident and agree with the note above.   Delirium, tachycardia overnight improved with haldol and labetalol.   Continue to monitor closely, tight BP control.  Yolonda Kida PCCM Pager: [phone] Cell: [phone] If no response, call [phone]

## 2011-10-03 NOTE — Progress Notes (Signed)
Speech Language Pathology Dysphagia Treatment Patient Details Name: Jeremy [name] MRN: [id number] DOB: [birth year] Today's Date: 10/03/2011 Time: 4098-1191 SLP Time Calculation (min): 25 min  Assessment / Plan / Recommendation Clinical Impression  Purpose of diagnostic treatment for diet tolerance of regular consistency and thin liquids.  Patient with episode of emesis on 10/02/11 prior to initial BSE.  BSE indicates oropharyngeal swallow functional for regular consistency and thin liquids wth aspiration precautions.  Clear liquid diet was initiated secondary to nausea .   CXR completed on  10/03/11  due to concerns with aspiration s/p emesis episode indicates cardiomegaly , no edema and no focal consolidations .  Patient observed with dual consistency, thin liquid by cup and straw, and regular consistency with no outward s/s of aspiration noted.  Moderate verbal and tactile cues required for patient to take small sips and bites as  patient continues with decreased safety awareness and problem solving.  Recommend to initiate regular diet  as tolerated with thin liquids with full supervision secondary to cognitive deficits.        Diet Recommendation  Continue with Current Diet: Regular;Thin liquid    SLP Plan Continue with current plan of care      Swallowing Goals  SLP Swallowing Goals Swallow Study Goal #1 - Progress: Progressing toward goal Swallow Study Goal #2 - Progress: Progressing toward goal  General Temperature Spikes Noted: No Respiratory Status: Supplemental O2 delivered via (comment) Behavior/Cognition: Lethargic;Cooperative;Pleasant mood;Confused;Decreased sustained attention Oral Cavity - Dentition: Missing dentition Patient Positioning: Upright in bed  Oral Cavity - Oral Hygiene Does patient have any of the following "at risk" factors?: Oxygen therapy - cannula, mask, simple oxygen devices Patient is HIGH RISK - Oral Care Protocol followed (see row info): Yes Patient is AT  RISK - Oral Care Protocol followed (see row info): Yes   Dysphagia Treatment Treatment focused on: Skilled observation of diet tolerance;Patient/family/caregiver education;Facilitation of pharyngeal phase Family/Caregiver Educated: niece Treatment Methods/Modalities: Skilled observation;Differential diagnosis Patient observed directly with PO's: Yes Type of PO's observed: Thin liquids;Regular Feeding: Needs assist;Able to feed self Liquids provided via: Cup;Straw Type of cueing: Verbal Amount of cueing: Moderate   Moreen Fowler MS, CCC-SLP [phone]     [facility] 10/03/2011, 12:02 PM

## 2011-10-03 NOTE — Progress Notes (Signed)
CRITICAL VALUE ALERT  Critical value received:  CKMB 10.4  Date of notification:  8/18  Time of notification:  0007  Critical value read back:yes  Nurse who received alert:  Barbarann Ehlers, RN  MD notified (1st page):  Deterding  Time of first page:  0015  MD notified (2nd page):  Time of second page:  Responding MD:  Deterding  Time MD responded:  [phone]

## 2011-10-03 NOTE — Progress Notes (Signed)
Stroke Team Progress Note  SUBJECTIVE [name] is a 46 y.o. male  who had been c/o of headaches for the past 7 days, with nausea and vomiting,  On 10/01/11 morning according to his wife he had visual disturbance and then he vomited followed by left sided weakness approximately 7 AM.  Patient arrived to the ED. Head CT was done that showed, Right BG, bleed with some area involved in thalamus. With minimal mass effect.  According to the wife he has stopped taking his BP medications several years ago and does not go the physician as Suggested. Previously, he had significant neurological symptoms with significant elevated BP that could have been attributed to Hypertensive encephalopathy.    He has remained stable overnight in the intensive care unit with blood pressure tightly controlled by IV nicardipine drip. The patient was initially considered for participation in the  [facility] 2 trial and wife signed consent form but unfortunately it was too late to randomize him into the study. He passed a swallow eval yesterday and is on a diet. MRI scan is pending this morning. The patient's wife has informed me that he was on hydrochlorothiazide, Norvasc and Lipitor in the past.  Most recent Vital Signs: Temp: 99.1 F (37.3 C) (08/18 0400) Temp src: Oral (08/18 0400) BP: 141/80 mmHg (08/18 0600) Pulse Rate: 96  (08/18 0600) Respiratory Rate: 42 O2 Saturdation: 98%  CBG (last 3)   Basename 10/03/11 0437 10/03/11 0057 10/02/11 2039  GLUCAP 106* 157* 134*   Intake/Output from previous day: 08/17 0701 - 08/18 0700 In: 3294 [P.O.:120; I.V.:3174] Out: 1950 [Urine:1950]  IV Fluid Intake:      . sodium chloride 100 mL/hr at 10/03/11 0600  . DISCONTD: niCARDipine Stopped (10/03/11 0003)   Diet: Full Liquid    Activity: bedrest DVT Prophylaxis:  SCDs  Studies: Results for orders placed during the hospital encounter of 10/01/11 (from the past 24 hour(s))  HEMOGLOBIN A1C     Status: Normal   Collection Time   10/02/11 10:29 AM      Component Value Range   Hemoglobin A1C 5.6  <5.7 %   Mean Plasma Glucose 114  <117 mg/dL  GLUCOSE, CAPILLARY     Status: Abnormal   Collection Time   10/02/11 12:32 PM      Component Value Range   Glucose-Capillary 129 (*) 70 - 99 mg/dL  GLUCOSE, CAPILLARY     Status: Abnormal   Collection Time   10/02/11  4:34 PM      Component Value Range   Glucose-Capillary 138 (*) 70 - 99 mg/dL  GLUCOSE, CAPILLARY     Status: Abnormal   Collection Time   10/02/11  8:39 PM      Component Value Range   Glucose-Capillary 134 (*) 70 - 99 mg/dL  CBC     Status: Abnormal   Collection Time   10/02/11 10:06 PM      Component Value Range   WBC 15.4 (*) 4.0 - 10.5 K/uL   RBC 4.33  4.22 - 5.81 MIL/uL   Hemoglobin 12.7 (*) 13.0 - 17.0 g/dL   HCT 40.9 (*) 81.1 - 91.4 %   MCV 88.0  78.0 - 100.0 fL   MCH 29.3  26.0 - 34.0 pg   MCHC 33.3  30.0 - 36.0 g/dL   RDW 78.2  95.6 - 21.3 %   Platelets 312  150 - 400 K/uL  COMPREHENSIVE METABOLIC PANEL     Status: Abnormal   Collection  Time   10/02/11 10:06 PM      Component Value Range   Sodium 136  135 - 145 mEq/L   Potassium 3.5  3.5 - 5.1 mEq/L   Chloride 100  96 - 112 mEq/L   CO2 24  19 - 32 mEq/L   Glucose, Bld 138 (*) 70 - 99 mg/dL   BUN 12  6 - 23 mg/dL   Creatinine, Ser 1.61  0.50 - 1.35 mg/dL   Calcium 9.7  8.4 - 09.6 mg/dL   Total Protein 8.6 (*) 6.0 - 8.3 g/dL   Albumin 3.5  3.5 - 5.2 g/dL   AST 24  0 - 37 U/L   ALT 22  0 - 53 U/L   Alkaline Phosphatase 96  39 - 117 U/L   Total Bilirubin 0.3  0.3 - 1.2 mg/dL   GFR calc non Af Amer >90  >90 mL/min   GFR calc Af Amer >90  >90 mL/min  CARDIAC PANEL(CRET KIN+CKTOT+MB+TROPI)     Status: Abnormal   Collection Time   10/02/11 10:06 PM      Component Value Range   Total CK 393 (*) 7 - 232 U/L   CK, MB 10.4 (*) 0.3 - 4.0 ng/mL   Troponin I <0.30  <0.30 ng/mL   Relative Index 2.6 (*) 0.0 - 2.5  GLUCOSE, CAPILLARY     Status: Abnormal   Collection Time    10/03/11 12:57 AM      Component Value Range   Glucose-Capillary 157 (*) 70 - 99 mg/dL  GLUCOSE, CAPILLARY     Status: Abnormal   Collection Time   10/03/11  4:37 AM      Component Value Range   Glucose-Capillary 106 (*) 70 - 99 mg/dL     Ct Head Wo Contrast  10/02/2011  *RADIOLOGY REPORT*  Clinical Data: History of intracranial hemorrhage with increased headache and left-sided weakness.  CT HEAD WITHOUT CONTRAST  Technique:  Contiguous axial images were obtained from the base of the skull through the vertex without contrast.  Comparison: 10/01/2011  Findings: Acute intraparenchymal hematoma in the right thalamic region measuring 2.2 x 3.9 cm.  There is mild surrounding edema. The hematoma measures slightly larger than the previous study. Mild focal midline shift of about 2 mm, similar to previous study. Increased density material in the third and lateral ventricles consistent with intraventricular hemorrhage.  Mild ventricular dilatation is stable.  No abnormal extra-axial fluid collections. No new focus of hemorrhage is demonstrated.  Examination is otherwise unchanged.  IMPRESSION: Right thalamic hematoma appears slightly enlarged since previous study with mild surrounding edema.  Intraventricular hemorrhage and mild ventricular dilatation is stable.  Original Report Authenticated By: Jeremy [name], M.D.   Ct Head Wo Contrast  10/01/2011  *RADIOLOGY REPORT*  Clinical Data: Intracranial hemorrhage.  Increased headache and left-sided weakness  CT HEAD WITHOUT CONTRAST  Technique:  Contiguous axial images were obtained from the base of the skull through the vertex without contrast.  Comparison: CT 10/01/2011  Findings: Right thalamic hemorrhage measures 16 x 34 mm and is unchanged.  There is mild intraventricular hemorrhage also unchanged.  .  No new area of hemorrhage.  No acute infarct or mass lesion.  Lateral ventricles are minimally larger.  Temporal horns are slightly larger.  IMPRESSION: Right  thalamic hemorrhage with interventricular extension is unchanged.  No new hemorrhage.  Mild dilatation of the temporal horns compatible with early hydrocephalus.  Original Report Authenticated By: Jeremy [name], M.D.  Dg Chest Port 1 View  10/03/2011  *RADIOLOGY REPORT*  Clinical Data: Possible aspiration.  PORTABLE CHEST - 1 VIEW  Comparison: 10/01/2011  Findings: Heart is enlarged.  There are no focal consolidations or pleural effusions.  No overt edema.  There has been improvement in aeration versus change in technique compared prior study.  IMPRESSION: Cardiomegaly.  No edema.  Original Report Authenticated By: Patterson Hammersmith, M.D.    Physical Exam:   Pleasant middle-aged [place] American gentleman who appears currently not to be in distress.  Awake alert. Afebrile. Head is nontraumatic. Neck is supple without bruit. Hearing is normal. Cardiac exam no murmur or gallop. Lungs are clear to auscultation. Distal pulses are well felt.  Neurological Exam :   Sleepy this a.m. but can be aroused with normal speech and language. Visual acuity seems adequate. There is dense left homonymous hemianopsia. Mild right gaze preference with skew eye deviation with right eye downward and rightward deviated. Vertical gaze paralysis. Pupils 4 mm sluggishly reactive. Mild left lower facial weakness. Tongue is midline. Significant left hemiparesis with 3/5 strength in the left approximately and 2/5 strength in the left lower extremity. Diminished sensation on the left. Left plantar is upgoing. Right is downgoing. Coordination is impaired on the left. Gait was not tested. ASSESSMENT [name] is a 46 y.o. male with hypertensive right thalamic intracerebral hemorrhage with intraventricular extension with mild hydrocephalus and cytotoxic cerebral edema. History of medication noncompliance. Malignant hypertension  Hospital day # 2  TREATMENT/PLAN Aggressive blood pressure control. Restart home medications  hydrochlorothiazide and Norvasc as well as Lipitor. Continue to monitor blood pressure aggressively and follow MRI later today.. I had a long discussion with the patient,wife and his niece regarding his condition, prognosis, plan of care and answered questions.  This patient is critically ill and at significant risk of neurological worsening, death and care requires constant monitoring of vital signs, hemodynamics,respiratory and cardiac monitoring,review of multiple databases, neurological assessment, discussion with family, other specialists and medical decision making of high complexity. I spent 30 minutes of neurocritical care time  in the care of  this patient.  Delia Heady, MD Medical Director [facility] Pager: [phone] 10/03/2011 9:46 AM   Gates Rigg, MD [facility] Pager: [phone] 10/03/2011 9:46 AM

## 2011-10-03 NOTE — Progress Notes (Signed)
Speech Language Pathology Treatment Patient Details Name: Jeremy [name] MRN: [id number] DOB: 1965/03/21 Today's Date: 10/03/2011 Time: 1140-1200 SLP Time Calculation (min): 20 min  Assessment / Plan / Recommendation Clinical Impression  Patient continues with moderate cognitive deficits in areas of intellectual and emergent awareness, problem solving, sustained attention, and auditory comprehension.  Recommend to continue Skilled ST treatment in acute care setting to address cognitive deficits.  Recommend CIR consult.        SLP Plan  Continue with current plan of care       SLP Goals     General Temperature Spikes Noted: No Respiratory Status: Supplemental O2 delivered via (comment) Behavior/Cognition: Cooperative;Confused;Impulsive;Distractible;Requires cueing Oral Cavity - Dentition: Missing dentition Patient Positioning: Upright in bed  Oral Cavity - Oral Hygiene Does patient have any of the following "at risk" factors?: Oxygen therapy - cannula, mask, simple oxygen devices Patient is HIGH RISK - Oral Care Protocol followed (see row info): Yes Patient is AT RISK - Oral Care Protocol followed (see row info): Yes   Treatment Treatment focused on: Cognition Skilled Treatment: Purpose of diagnostic treatment to improve functional cognitive skills following initial Cognitive Linguistic evlauation completed on 10/02/11.  Treatment focused on verbal and functional basic tasks.  Max verbal and tactile cues required for patient to correctly use call light and correctly state recommended swallow precautions due to decreased sustained attention.  Max verbal cues for patient to state current deficits following stroke.   Moreen Fowler, M.S., CCC-SLP [phone]     [facility] 10/03/2011, 12:10 PM

## 2011-10-04 LAB — GLUCOSE, CAPILLARY
Glucose-Capillary: 123 mg/dL — ABNORMAL HIGH (ref 70–99)
Glucose-Capillary: 131 mg/dL — ABNORMAL HIGH (ref 70–99)
Glucose-Capillary: 132 mg/dL — ABNORMAL HIGH (ref 70–99)
Glucose-Capillary: 132 mg/dL — ABNORMAL HIGH (ref 70–99)
Glucose-Capillary: 135 mg/dL — ABNORMAL HIGH (ref 70–99)
Glucose-Capillary: 140 mg/dL — ABNORMAL HIGH (ref 70–99)
Glucose-Capillary: 145 mg/dL — ABNORMAL HIGH (ref 70–99)

## 2011-10-04 LAB — CBC
HCT: 40.1 % (ref 39.0–52.0)
Hemoglobin: 13.7 g/dL (ref 13.0–17.0)
MCH: 30 pg (ref 26.0–34.0)
MCHC: 34.2 g/dL (ref 30.0–36.0)
MCV: 87.7 fL (ref 78.0–100.0)
Platelets: 325 10*3/uL (ref 150–400)
RBC: 4.57 MIL/uL (ref 4.22–5.81)
RDW: 13.6 % (ref 11.5–15.5)
WBC: 15.7 10*3/uL — ABNORMAL HIGH (ref 4.0–10.5)

## 2011-10-04 LAB — PROTEIN S ACTIVITY: Protein S Activity: 85 % (ref 69–129)

## 2011-10-04 LAB — LUPUS ANTICOAGULANT PANEL
DRVVT: 39.7 secs (ref <45.1)
Lupus Anticoagulant: NOT DETECTED
PTT Lupus Anticoagulant: 34.1 secs (ref 28.0–43.0)

## 2011-10-04 LAB — BASIC METABOLIC PANEL
BUN: 13 mg/dL (ref 6–23)
CO2: 29 mEq/L (ref 19–32)
Calcium: 10 mg/dL (ref 8.4–10.5)
Chloride: 91 mEq/L — ABNORMAL LOW (ref 96–112)
Creatinine, Ser: 0.87 mg/dL (ref 0.50–1.35)
GFR calc Af Amer: >90 mL/min (ref >90)
GFR calc non Af Amer: >90 mL/min (ref >90)
Glucose, Bld: 172 mg/dL — ABNORMAL HIGH (ref 70–99)
Potassium: 3.5 mEq/L (ref 3.5–5.1)
Sodium: 130 mEq/L — ABNORMAL LOW (ref 135–145)

## 2011-10-04 LAB — FACTOR 5 LEIDEN

## 2011-10-04 LAB — PROTEIN C ACTIVITY: Protein C Activity: 148 % — ABNORMAL HIGH (ref 75–133)

## 2011-10-04 MED ORDER — ENOXAPARIN SODIUM 40 MG/0.4ML ~~LOC~~ SOLN
40.0000 mg | SUBCUTANEOUS | Status: DC
  Administered 2011-10-04 – 2011-10-09 (×5): 40 mg via SUBCUTANEOUS
  Filled 2011-10-04 (×7): qty 0.4

## 2011-10-04 MED ORDER — AMLODIPINE BESYLATE 10 MG PO TABS
10.0000 mg | ORAL_TABLET | Freq: Every day | ORAL | Status: DC
  Administered 2011-10-04: 10 mg via ORAL
  Filled 2011-10-04 (×2): qty 1

## 2011-10-04 MED ORDER — DIVALPROEX SODIUM ER 500 MG PO TB24
500.0000 mg | ORAL_TABLET | Freq: Every day | ORAL | Status: DC
  Administered 2011-10-04: 500 mg via ORAL
  Filled 2011-10-04 (×2): qty 1

## 2011-10-04 MED ORDER — PANTOPRAZOLE SODIUM 40 MG PO TBEC
40.0000 mg | DELAYED_RELEASE_TABLET | Freq: Every day | ORAL | Status: DC
  Administered 2011-10-04: 40 mg via ORAL
  Filled 2011-10-04: qty 1

## 2011-10-04 MED ORDER — ATROPINE SULFATE 1 MG/ML IJ SOLN
1.0000 mg | Freq: Once | INTRAMUSCULAR | Status: AC | PRN
  Filled 2011-10-04 (×2): qty 1

## 2011-10-04 NOTE — Evaluation (Signed)
Occupational Therapy Evaluation Patient Details Name: Jeremy [name] MRN: [id number] DOB: Jun 26, 1965 Today's Date: 10/04/2011 Time: 2130-8657 OT Time Calculation (min): 35 min  OT Assessment / Plan / Recommendation Clinical Impression  46 yo male admitted due to incontinence, vomitting, Lt side weakness, and HA. CT revealed Rt thalamic ICH hydrocephalus cytotoxic cerebral edema and chart notes visual changes. Ot to follow acutely. Recommend CIR v.s SnF placement.    OT Assessment  Patient needs continued OT Services    Follow Up Recommendations  Inpatient Rehab    Barriers to Discharge      Equipment Recommendations  Defer to next venue    Recommendations for Other Services Rehab consult  Frequency  Min 2X/week    Precautions / Restrictions Precautions Precautions: Fall Restrictions Weight Bearing Restrictions: No   Pertinent Vitals/Pain None BP 198117 on arrival End of session 19276 (107) Pt demonstrates orthostatic DBP values    ADL  Grooming: Simulated;Wash/dry face;Minimal assistance Where Assessed - Grooming: Supine, head of bed up Transfers/Ambulation Related to ADLs: not appropriate at this time ADL Comments: Pt supine on arrival. Pt kept eyes closed the entire session. Pt required manual (A) to open eye lids and pt unable accurately report any visual input. Pt responding to all questions appropriately with delay response. Pt following simple commands with difficulty terminating task. Pt with associated reaction on LT UE. Pt no grip strength. Pt adducting LT UE to body with tone present.     OT Diagnosis: Cognitive deficits;Disturbance of vision;Generalized weakness;Hemiplegia non-dominant side;Altered mental status  OT Problem List: Decreased strength;Decreased activity tolerance;Decreased range of motion;Impaired balance (sitting and/or standing);Impaired vision/perception;Obesity;Impaired UE functional use;Impaired sensation;Cardiopulmonary status limiting  activity;Decreased knowledge of precautions;Decreased knowledge of use of DME or AE;Decreased safety awareness;Decreased cognition;Decreased coordination OT Treatment Interventions: Therapeutic exercise;Self-care/ADL training;Neuromuscular education;DME and/or AE instruction;Therapeutic activities;Cognitive remediation/compensation;Visual/perceptual remediation/compensation;Patient/family education;Balance training   OT Goals Acute Rehab OT Goals OT Goal Formulation: With family Time For Goal Achievement: 10/18/11 Potential to Achieve Goals: Fair ADL Goals Pt Will Perform Grooming: with max assist;Supine, head of bed up;Supported;with cueing (comment type and amount) (visual and hand over hand ) ADL Goal: Grooming - Progress: Goal set today Miscellaneous OT Goals Miscellaneous OT Goal #1: PT will tolerate EOB sitting for ~5 minutes with Mod (A) as precursor to adls OT Goal: Miscellaneous Goal #1 - Progress: Goal set today Miscellaneous OT Goal #2: PT will complete bed mobility log roll to the left side Min (A) as precursor to ADLS OT Goal: Miscellaneous Goal #2 - Progress: Goal set today Miscellaneous OT Goal #3: Pt will follow simple commands consistently 80 % of session OT Goal: Miscellaneous Goal #3 - Progress: Goal set today  Visit Information  Last OT Received On: 10/04/11 Assistance Needed: +3 or more (impulsive with poor position sense "in space") PT/OT Co-Evaluation/Treatment: Yes    Subjective Data  Subjective: "brother in law"- pt respone to questioning cues about other individuals in the room Patient Stated Goal: unknown at this time   Prior Functioning  Vision/Perception  Home Living Lives With: Spouse Available Help at Discharge: Family;Available 24 hours/day Type of Home: House Home Access: Stairs to enter [employer] of Steps: 2 Entrance Stairs-Rails: None Home Layout: One level Bathroom Shower/Tub: Forensic scientist:  Standard Bathroom Accessibility: Yes How Accessible: Accessible via walker Home Adaptive Equipment: None Additional Comments: enjoys watching tv (wrestling) , like to participate with church, mows grass at home, likes to play on computer Prior Function Level of Independence: Independent Able to Take Stairs?:  Yes Driving: Yes Vocation: Full time employment Communication Communication: No difficulties Dominant Hand: Right   Perception Perception: Within Functional Limits  Cognition  Overall Cognitive Status: Impaired Area of Impairment: Memory;Following commands;Safety/judgement;Awareness of deficits;Awareness of errors;Problem solving;Attention Arousal/Alertness: Lethargic Orientation Level: Disoriented to;Place;Time;Situation Behavior During Session: Lethargic Current Attention Level: Focused Memory: Decreased recall of precautions Following Commands: Follows one step commands inconsistently Safety/Judgement: Decreased awareness of safety precautions;Decreased safety judgement for tasks assessed;Impulsive;Decreased awareness of need for assistance Awareness of Errors: Assistance required to identify errors made Problem Solving: Pt reporting that he is currently at the church    Extremity/Trunk Assessment Right Upper Extremity Assessment RUE ROM/Strength/Tone: Within functional levels RUE Sensation: WFL - Light Touch RUE Coordination: WFL - gross/fine motor Left Upper Extremity Assessment LUE ROM/Strength/Tone: Deficits LUE ROM/Strength/Tone Deficits: shoulder flexion 1 out 5 , tone noted LUE Sensation: Deficits LUE Coordination: Deficits Trunk Assessment Trunk Assessment: Normal   Mobility Bed Mobility Rolling Right: 2: Max assist;With rail Rolling Left: 1: +1 Total assist Supine to Sit: 1: +2 Total assist;With rails;HOB elevated Supine to Sit: Patient Percentage: 20% Sitting - Scoot to Edge of Bed: 1: +2 Total assist;With rail Sitting - Scoot to [place] of Bed: Patient  Percentage: 10% Sit to Supine: 1: +2 Total assist;With rail;HOB flat Sit to Supine: Patient Percentage: 10% Details for Bed Mobility Assistance: pt required HOH to place Rt UE on bed rail. Pt releasing bed rail and holding onto PT arm and pants. Pt not releasing PT with v/c and required total HOH to release. Pt fearful of falling sitting EOB   Exercise    Balance    End of Session OT - End of Session Activity Tolerance: Patient limited by fatigue Patient left: in bed;with call bell/phone within reach;with family/visitor present Nurse Communication: Need for lift equipment  GO     Lucile Shutters 10/04/2011, 12:30 PM  Pager: [phone]

## 2011-10-04 NOTE — Progress Notes (Addendum)
Pt heart rate dropped to 47 with a 2.34 second pause. CCM was notified;ordered pacer pads to be placed on patient and atropine at the bedside. Will continue to monitor.

## 2011-10-04 NOTE — Progress Notes (Signed)
Physical Therapy Treatment Patient Details Name: Jeremy [name] MRN: [id number] DOB: 06-12-1965 Today's Date: 10/04/2011 Time: 4098-1191 PT Time Calculation (min): 33 min  PT Assessment / Plan / Recommendation Comments on Treatment Session  Pt appeared to have degraded in function somewhat from evaluation.  He appears to be abscent to LT on L, hemiplegic with associated reactions on L and squewed position sense in sitting with significant balance deficits.  Rec. cir level of therapies.    Follow Up Recommendations  Inpatient Rehab;Supervision/Assistance - 24 hour    Barriers to Discharge        Equipment Recommendations  Defer to next venue    Recommendations for Other Services Rehab consult  Frequency     Plan Discharge plan remains appropriate    Precautions / Restrictions Precautions Precautions: Fall Restrictions Weight Bearing Restrictions: No   Pertinent Vitals/Pain     Mobility  Bed Mobility Bed Mobility: Supine to Sit;Sitting - Scoot to [place] of Bed;Sit to Supine;Rolling Right;Rolling Left Rolling Right: 2: Max assist;With rail Rolling Left: 1: +1 Total assist Supine to Sit: 1: +2 Total assist;With rails;HOB elevated Supine to Sit: Patient Percentage: 20% Sitting - Scoot to Edge of Bed: 1: +2 Total assist;With rail Sitting - Scoot to [place] of Bed: Patient Percentage: 10% Sit to Supine: 1: +2 Total assist;With rail;HOB flat Sit to Supine: Patient Percentage: 10% Details for Bed Mobility Assistance: VC for sequencing. Pt unable to move L side at all requiring assistance with UE and LE as well as support through trunk. Pt large stature requiring increased assistance for safety. Pt able to complete majority of roll with assistance for support and control as pt extremely impulsive during mobility. Pt required increased cueing throughout transfer for safety Transfers Transfers: Not assessed Ambulation/Gait Ambulation/Gait Assistance: Not tested (comment) Stairs:  No Wheelchair Mobility Wheelchair Mobility: No Modified Rankin (Stroke Patients Only) Pre-Morbid Rankin Score: No symptoms Modified Rankin: Severe disability    Exercises     PT Diagnosis:    PT Problem List:   PT Treatment Interventions:     PT Goals Acute Rehab PT Goals Time For Goal Achievement: 10/16/11 Potential to Achieve Goals: Fair PT Goal: Supine/Side to Sit - Progress: Not progressing (appears to have degraded function today) PT Goal: Sit at Edge Of Bed - Progress: Progressing toward goal PT Goal: Sit to Supine/Side - Progress: Progressing toward goal  Visit Information  Last PT Received On: 10/04/11 Assistance Needed: +3 or more (impulsive with poor position sense "in space")    Subjective Data      Cognition  Overall Cognitive Status: Impaired Area of Impairment: Memory;Following commands;Safety/judgement;Awareness of deficits;Awareness of errors;Problem solving;Attention Arousal/Alertness: Lethargic Orientation Level: Disoriented to;Place Behavior During Session: [facility] for tasks performed Current Attention Level: Focused Memory: Decreased recall of precautions Following Commands: Follows one step commands inconsistently Safety/Judgement: Decreased awareness of safety precautions;Decreased safety judgement for tasks assessed;Impulsive;Decreased awareness of need for assistance Awareness of Errors: Assistance required to identify errors made Problem Solving: Pt reporting that he is currently at the church Cognition - Other Comments: pt inconsistent with responses, decreased attention. Pt with random comments throughout session. Pt with partial neglect of LLE as well problems with all problem solving    Balance  Balance Balance Assessed: Yes Static Sitting Balance Static Sitting - Balance Support: Right upper extremity supported;Feet supported Static Sitting - Level of Assistance: 1: +2 Total assist Static Sitting - Comment/# of Minutes: After struggling to sit  and EOB, pt kept grabbing at therapist with R  hand in an attempt to stay put, to keep from falling.  Two person total assist to help control his trunk and balance Generally, he pushed into extension wihile grabbing forward.  He sat for a total of 5 minutes before need to lay down.  End of Session PT - End of Session Activity Tolerance: Patient tolerated treatment well Patient left: in bed;with call bell/phone within reach;with nursing in room Nurse Communication: Mobility status   GP     Lashuna Tamashiro, Eliseo Gum 10/04/2011, 12:40 PM  10/04/2011  Media Bing, PT [phone] (pager)

## 2011-10-04 NOTE — Progress Notes (Signed)
SLP Cancellation Note Attempt x1 for Cognitive Treatment and not completed due to patient presenting with lethargy and unable to participate fully. ST to address at later date. Moreen Fowler MS, CCC-SLP [phone]  [facility] 10/04/2011, 2:46 PM

## 2011-10-04 NOTE — Progress Notes (Signed)
Placed at default settings for unk home settings

## 2011-10-04 NOTE — Progress Notes (Signed)
Increased to 4lpm o2 to achieve good spo2

## 2011-10-04 NOTE — Progress Notes (Signed)
A small portion of patient's right front tooth was found on patients face. Sitter stated that pt had been attempted to remove safety mitten with teeth a few minutes ago.  Tooth was placed in denture cup. Family and MD was notified., no new orders at this time.

## 2011-10-04 NOTE — Progress Notes (Signed)
Name: Dilan Novosad MRN: [id number] DOB: 1966-02-09    LOS: 3  Referring Provider:  Neuro Reason for Referral:  CVA    PULMONARY / CRITICAL CARE MEDICINE  HPI:  46 yo AAM with PMH of HTN presented to [facility] ED on 8/16 via EMS as a code stroke.  He woke up at 7 am and was unable to ambulate to bathroom, use left hand and was incontinent of urine.  He apparently woke several times during the night to urinate (last at 4am).  He noted right sided headache and vomited in the ambulance.  CT eval demonstrated R basal banglia hemorrhage with minimal surrounding edema.  PCCM consulted for ICU mgmt.     Brief patient description:  46 y/o AAM with HTN admitted with R basal ganglia hemorrhage.   Events Since Admission: 8/16 Admit with basal ganglia hemorrhage  Subjective:  Extremely agitated overnight, received more haldol ?OSA Very somnolent this am.  Issues with bradycardia, labetalol d/c'd   Vital Signs: Temp:  [98.1 F (36.7 C)-98.8 F (37.1 C)] 98.6 F (37 C) (08/19 0400) Pulse Rate:  [59-118] 78  (08/19 0700) BP: (144-186)/(65-134) 151/96 mmHg (08/19 0700) SpO2:  [89 %-100 %] 99 % (08/19 0700) Weight:  [126.3 kg (278 lb 7.1 oz)] 126.3 kg (278 lb 7.1 oz) (08/19 0500)  Physical Examination:  Gen: lying comfortably in bed HEENT: NCAT, PERRL, EOMi,  PULM: CTA B CV: RRR, no mgr, no JVD AB: BS+, soft, nontender, no hsm Ext: warm, no edema, no clubbing, no cyanosis Derm: no rash or skin breakdown Neuro: somnolent post haldol, will arouse and answer questions.   Principal Problem:  *Malignant hypertension requiring acute intensive management Active Problems:  ICH (intracerebral hemorrhage)  HTN (hypertension)  Hypoxemia  Altered mental status  Cytotoxic cerebral edema  Obstructive hydrocephalus   ASSESSMENT AND PLAN  NEUROLOGIC 8/17 CT Head >> R thalamic hematoma slightly enlarged with surrounding edema; 2mm midline shift MRI brain 8/18>>>no change in R thalamic bleed with  slight involvement of 3rd vent.  A:   Acute R Basal Ganglia Hemorrhage -hemorrhage with small amt edema  P:   -appreciate Neurology and NSGY, supportive care, tight BP control for now -neuro checks -passed swallow -further imaging per Neurology -seizure precautions -decadron -d/c sedating meds for now to include haldol   PULMONARY  Lab 10/01/11 1750 10/01/11 1102  PHART 7.403 7.345*  PCO2ART 41.3 51.0*  PO2ART 84.1 99.3  HCO3 25.3* 27.1*  O2SAT 96.8 98.5   Ventilator Settings:   CXR:  No film 8/19 ETT:  none  A:   No acute issues, poss OSA  P:   -supportive care, O2 to keep sats -qhs cpap  -aspiration precautions -monitor respiratory status closely with CVA and lethargy 8/19 post haldol   CARDIOVASCULAR  Lab 10/02/11 2206 10/01/11 1013 10/01/11 0742  TROPONINI <0.30 -- <0.30  LATICACIDVEN -- -- --  PROBNP -- 36.5 --   ECG:  SR  Lines: PIV 8/16 TTE >> limited study, LVEF 60-70%  A: HTN  P:  -Cont PO anti-HTN  -labetalol d/c -prn hydralazine   RENAL  Lab 10/04/11 0510 10/02/11 2206 10/02/11 0515 10/01/11 1004 10/01/11 0757 10/01/11 0742  NA 130* 136 136 139 143 --  K 3.5 3.5 -- -- -- --  CL 91* 100 102 104 105 --  CO2 29 24 24 30  -- 25  BUN 13 12 9 9 10  --  CREATININE 0.87 0.83 0.80 0.90 1.10 --  CALCIUM 10.0 9.7 9.4 9.2 --  9.3  MG -- -- 2.0 2.1 -- --  PHOS -- -- 2.6 1.5* -- --    Intake/Output Summary (Last 24 hours) at 10/04/11 0906 Last data filed at 10/04/11 0700  Gross per 24 hour  Intake    690 ml  Output   3425 ml  Net  -2735 ml    Foley:  8/16>>>  A:   Hyponatremia ?SIADH P:   -trend BMP -check urine lytes   GASTROINTESTINAL  Lab 10/02/11 2206 10/01/11 1004 10/01/11 0742  AST 24 23 24   ALT 22 23 23   ALKPHOS 96 104 109  BILITOT 0.3 0.2* 0.2*  PROT 8.6* 8.6* 8.5*  ALBUMIN 3.5 3.6 3.5    A:   NPO Rule out Dysphagia  P:   -passed swallow eval   HEMATOLOGIC  Lab 10/04/11 0510 10/02/11 2206 10/02/11 0515  10/01/11 1004 10/01/11 0757 10/01/11 0742  HGB 13.7 12.7* 13.5 12.9* 14.3 --  HCT 40.1 38.1* 39.7 39.2 42.0 --  PLT 325 312 295 262 -- 265  INR -- -- -- 1.09 -- 1.04  APTT -- -- -- 30 -- 31   A:   No acute issues  P:  -monitor cbc, PT/INR  INFECTIOUS  Lab 10/04/11 0510 10/02/11 2206 10/02/11 0515 10/01/11 1004 10/01/11 0951 10/01/11 0742  WBC 15.7* 15.4* 11.4* 6.2 -- 6.2  PROCALCITON -- -- -- -- <0.10 --   Cultures: none  Antibiotics: none  A:   No acute infectious process  P:   -wbc trending up but remains afebrile  -cont monitor and hold ABX  ENDOCRINE  Lab 10/03/11 2338 10/03/11 1950 10/03/11 1546 10/03/11 1203 10/03/11 0437  GLUCAP 135* 115* 138* 118* 106*   A:   Hyperglycemia  P:   -SSI CBG for tighter control with CVA -check A1c   BEST PRACTICE / DISPOSITION Level of Care:  ICU Primary Service:  PCCM Consultants:  Neurology Code Status:  Full Diet:  PO DVT Px:  SCD's GI Px:  Not indicated  Skin Integrity:  intact Social / Family: no family at bedside AM rounds     Austin Miles  [phone]  Cell  [phone]  If no response or cell goes to voicemail, call beeper [phone]  10/04/2011  9:06 AM

## 2011-10-04 NOTE — Progress Notes (Signed)
eLink Physician-Brief Progress Note Patient Name: Jeremy [name] DOB: Aug 31, 1965 MRN: [id number]  Date of Service  10/04/2011   HPI/Events of Note  Pause after labetolol, now asympto, BP sys wnl   eICU Interventions  Add atropine bedside, add pacer ads chest Dc labetolol Has hydralazine Rn reports NORMAL neuro exam, unchanged from prior      FEINSTEIN,DANIEL J. 10/04/2011, 12:14 AM

## 2011-10-04 NOTE — Progress Notes (Signed)
Stroke Team Progress Note  SUBJECTIVE Jeremy [name] is a 46 y.o. male  who had been c/o of headaches for the past 7 days, with nausea and vomiting,  On 10/01/11 morning according to his wife he had visual disturbance and then he vomited followed by left sided weakness approximately 7 AM.  Patient arrived to the ED. Head CT was done that showed, Right BG, bleed with some area involved in thalamus. With minimal mass effect.  According to the wife he has stopped taking his BP medications several years ago and does not go the physician as Suggested. Previously, he had significant neurological symptoms with significant elevated BP that could have been attributed to Hypertensive encephalopathy.    He has remained stable overnight in the intensive care unit with blood pressure tightly controlled by IV nicardipine drip. The patient was initially considered for participation in the  [facility] 2 trial and wife signed consent form but unfortunately it was too late to randomize him into the study. He passed a swallow eval yesterday and is on a diet. MRI scan is pending this morning. The patient's  BP remains mildly elevated. He was agitated last night and received haldol. Most recent Vital Signs: Temp: 98.6 F (37 C) (08/19 0400) Temp src: Oral (08/19 0400) BP: 151/96 mmHg (08/19 0700) Pulse Rate: 78  (08/19 0700) Respiratory Rate: 42 O2 Saturdation: 99%  CBG (last 3)   Basename 10/03/11 2338 10/03/11 1950 10/03/11 1546  GLUCAP 135* 115* 138*   Intake/Output from previous day: 08/18 0701 - 08/19 0700 In: 1240 [P.O.:720; I.V.:520] Out: 3425 [Urine:3425]  IV Fluid Intake:      . sodium chloride 10 mL/hr at 10/03/11 2100   Diet: Carb Control    Activity: bedrest DVT Prophylaxis:  SCDs  Studies: Results for orders placed during the hospital encounter of 10/01/11 (from the past 24 hour(s))  GLUCOSE, CAPILLARY     Status: Abnormal   Collection Time   10/03/11 12:03 PM      Component Value  Range   Glucose-Capillary 118 (*) 70 - 99 mg/dL  GLUCOSE, CAPILLARY     Status: Abnormal   Collection Time   10/03/11  3:46 PM      Component Value Range   Glucose-Capillary 138 (*) 70 - 99 mg/dL  GLUCOSE, CAPILLARY     Status: Abnormal   Collection Time   10/03/11  7:50 PM      Component Value Range   Glucose-Capillary 115 (*) 70 - 99 mg/dL  GLUCOSE, CAPILLARY     Status: Abnormal   Collection Time   10/03/11 11:38 PM      Component Value Range   Glucose-Capillary 135 (*) 70 - 99 mg/dL   Comment 1 Documented in Chart     Comment 2 Notify RN    CBC     Status: Abnormal   Collection Time   10/04/11  5:10 AM      Component Value Range   WBC 15.7 (*) 4.0 - 10.5 K/uL   RBC 4.57  4.22 - 5.81 MIL/uL   Hemoglobin 13.7  13.0 - 17.0 g/dL   HCT 16.1  09.6 - 04.5 %   MCV 87.7  78.0 - 100.0 fL   MCH 30.0  26.0 - 34.0 pg   MCHC 34.2  30.0 - 36.0 g/dL   RDW 40.9  81.1 - 91.4 %   Platelets 325  150 - 400 K/uL  BASIC METABOLIC PANEL     Status: Abnormal  Collection Time   10/04/11  5:10 AM      Component Value Range   Sodium 130 (*) 135 - 145 mEq/L   Potassium 3.5  3.5 - 5.1 mEq/L   Chloride 91 (*) 96 - 112 mEq/L   CO2 29  19 - 32 mEq/L   Glucose, Bld 172 (*) 70 - 99 mg/dL   BUN 13  6 - 23 mg/dL   Creatinine, Ser 1.61  0.50 - 1.35 mg/dL   Calcium 09.6  8.4 - 04.5 mg/dL   GFR calc non Af Amer >90  >90 mL/min   GFR calc Af Amer >90  >90 mL/min     Mr Brain Wo Contrast  10/03/2011  *RADIOLOGY REPORT*  Clinical Data: 46 year old male with intracranial hemorrhage. Increased headache and left-sided weakness.  Confusion.  MRI HEAD WITHOUT CONTRAST  Technique:  Multiplanar, multiecho pulse sequences of the brain and surrounding structures were obtained according to standard protocol without intravenous contrast.  Comparison: Head CTs 10/02/2011 and earlier.  Findings: Diffusion weighted images show artifact associated with the thalamic hemorrhage.  No larger area of restricted diffusion.  Diffusion signal outside of the right thalamus is within normal limits.  Hemorrhage centered within the right thalamus with surrounding edema, and expansion of the thalamus, is dark on T2 and isointense on T1 weighted images encompassing 18 x 37 x 30 mm (AP by transverse by CC, estimated volume 10 ml).  Edema tracks partially into the midbrain and right corona radiata out as before.  There is mass effect on the third ventricle, and mild enlargement of the lateral ventricles is stable.  No definite transependymal flow.  No additional intracranial hemorrhage identified, trace third ventricle and left occipital horn intraventricular hemorrhage is evident on T2*.  Patchy white matter T2 and FLAIR hyperintensity. Major intracranial vascular flow voids are preserved.  Negative pituitary and cervicomedullary junction.  Grossly negative visualized cervical spine.  Bone marrow signal appears normal.  Grossly negative orbit. Right maxillary sinus mucous retention cyst.  Fluid in the right mastoids.  Grossly negative nasopharynx.  IMPRESSION: 1.  Acute hemorrhage (deoxyhemoglobin stage) expanding the right thalamus plus surrounding edema and mass effect is stable since yesterday.  Estimated blood volume 10 mL. 2.  Trace intraventricular hemorrhage, effacement of the third ventricle, and mild enlargement of the lateral ventricles is stable.  No definite transependymal edema at this time.  Original Report Authenticated By: Jeremy [name], M.D.   Dg Chest Port 1 View  10/03/2011  *RADIOLOGY REPORT*  Clinical Data: Possible aspiration.  PORTABLE CHEST - 1 VIEW  Comparison: 10/01/2011  Findings: Heart is enlarged.  There are no focal consolidations or pleural effusions.  No overt edema.  There has been improvement in aeration versus change in technique compared prior study.  IMPRESSION: Cardiomegaly.  No edema.  Original Report Authenticated By: Jeremy [name], M.D.    Physical Exam:   Pleasant middle-aged [place]  American gentleman who appears currently not to be in distress.  Awake alert. Afebrile. Head is nontraumatic. Neck is supple without bruit. Hearing is normal. Cardiac exam no murmur or gallop. Lungs are clear to auscultation. Distal pulses are well felt.  Neurological Exam :   Sleepy this a.m. but can be aroused with normal speech and language. Visual acuity seems adequate. There is dense left homonymous hemianopsia. Mild right gaze preference with skew eye deviation with right eye downward and rightward deviated. Vertical gaze paralysis. Pupils 4 mm sluggishly reactive. Mild left lower facial weakness. Tongue  is midline. Significant left hemiparesis with 3/5 strength in the left approximately and 2/5 strength in the left lower extremity. Diminished sensation on the left. Left plantar is upgoing. Right is downgoing. Coordination is impaired on the left. Gait was not tested. ASSESSMENT Mr. Jamarcus Laduke is a 46 y.o. male with hypertensive right thalamic intracerebral hemorrhage with intraventricular extension with mild hydrocephalus and cytotoxic cerebral edema. History of medication noncompliance. Malignant hypertension  Hospital day # 3  TREATMENT/PLAN Aggressive blood pressure control.  Increase Norvasc to 10 mg daily. Continue to monitor blood pressure aggressively and mobilize out of bed.Dc SCDs . Start Lovenox for dvt prevention.Start depakote for agitation and minimise haldol use. I had a long discussion with the patient,wife and his niece regarding his condition, prognosis, plan of care and answered questions. D/W Dr Delford Field PCCM This patient is critically ill and at significant risk of neurological worsening, death and care requires constant monitoring of vital signs, hemodynamics,respiratory and cardiac monitoring,review of multiple databases, neurological assessment, discussion with family, other specialists and medical decision making of high complexity. I spent 30 minutes of neurocritical  care time  in the care of  this patient.  Jeremy Heady, Jeremy [name] Medical Director [facility] Pager: [phone] 10/04/2011 8:34 AM   Jeremy Rigg, Jeremy [name] [facility] Pager: [phone] 10/04/2011 8:34 AM

## 2011-10-05 ENCOUNTER — Inpatient Hospital Stay (HOSPITAL_COMMUNITY): Payer: BC Managed Care – PPO

## 2011-10-05 ENCOUNTER — Encounter (HOSPITAL_COMMUNITY): Payer: Self-pay | Admitting: Radiology

## 2011-10-05 DIAGNOSIS — J9601 Acute respiratory failure with hypoxia: Secondary | ICD-10-CM | POA: Diagnosis not present

## 2011-10-05 LAB — CBC
HCT: 42.3 % (ref 39.0–52.0)
Hemoglobin: 14.7 g/dL (ref 13.0–17.0)
MCH: 29.9 pg (ref 26.0–34.0)
MCHC: 34.8 g/dL (ref 30.0–36.0)
MCV: 86 fL (ref 78.0–100.0)
Platelets: 306 10*3/uL (ref 150–400)
RBC: 4.92 MIL/uL (ref 4.22–5.81)
RDW: 13 % (ref 11.5–15.5)
WBC: 12.3 10*3/uL — ABNORMAL HIGH (ref 4.0–10.5)

## 2011-10-05 LAB — GLUCOSE, CAPILLARY
Glucose-Capillary: 104 mg/dL — ABNORMAL HIGH (ref 70–99)
Glucose-Capillary: 106 mg/dL — ABNORMAL HIGH (ref 70–99)
Glucose-Capillary: 130 mg/dL — ABNORMAL HIGH (ref 70–99)
Glucose-Capillary: 82 mg/dL (ref 70–99)
Glucose-Capillary: 95 mg/dL (ref 70–99)
Glucose-Capillary: 98 mg/dL (ref 70–99)

## 2011-10-05 LAB — BASIC METABOLIC PANEL
BUN: 14 mg/dL (ref 6–23)
CO2: 31 mEq/L (ref 19–32)
Calcium: 10.1 mg/dL (ref 8.4–10.5)
Chloride: 90 mEq/L — ABNORMAL LOW (ref 96–112)
Creatinine, Ser: 0.81 mg/dL (ref 0.50–1.35)
GFR calc Af Amer: >90 mL/min (ref >90)
GFR calc non Af Amer: >90 mL/min (ref >90)
Glucose, Bld: 120 mg/dL — ABNORMAL HIGH (ref 70–99)
Potassium: 3.2 mEq/L — ABNORMAL LOW (ref 3.5–5.1)
Sodium: 131 mEq/L — ABNORMAL LOW (ref 135–145)

## 2011-10-05 MED ORDER — BIOTENE DRY MOUTH MT LIQD
15.0000 mL | Freq: Four times a day (QID) | OROMUCOSAL | Status: DC
  Administered 2011-10-05 – 2011-10-07 (×9): 15 mL via OROMUCOSAL

## 2011-10-05 MED ORDER — FENTANYL CITRATE 0.05 MG/ML IJ SOLN
100.0000 ug | Freq: Once | INTRAMUSCULAR | Status: AC
  Administered 2011-10-05: 100 ug via INTRAVENOUS

## 2011-10-05 MED ORDER — PANTOPRAZOLE SODIUM 40 MG PO PACK
40.0000 mg | PACK | Freq: Every day | ORAL | Status: DC
  Administered 2011-10-05 – 2011-10-18 (×11): 40 mg
  Filled 2011-10-05 (×15): qty 20

## 2011-10-05 MED ORDER — FENTANYL BOLUS VIA INFUSION
50.0000 ug | Freq: Four times a day (QID) | INTRAVENOUS | Status: DC | PRN
  Filled 2011-10-05: qty 100

## 2011-10-05 MED ORDER — VECURONIUM BROMIDE 10 MG IV SOLR
8.0000 mg | Freq: Once | INTRAVENOUS | Status: AC
  Administered 2011-10-05: 8 mg via INTRAVENOUS

## 2011-10-05 MED ORDER — FENTANYL CITRATE 0.05 MG/ML IJ SOLN
INTRAMUSCULAR | Status: AC
  Administered 2011-10-05: 100 ug via INTRAVENOUS
  Filled 2011-10-05: qty 4

## 2011-10-05 MED ORDER — CHLORHEXIDINE GLUCONATE 0.12 % MT SOLN
OROMUCOSAL | Status: AC
  Administered 2011-10-05: 15 mL via OROMUCOSAL
  Filled 2011-10-05: qty 15

## 2011-10-05 MED ORDER — VECURONIUM BROMIDE 10 MG IV SOLR
INTRAVENOUS | Status: AC
  Administered 2011-10-05: 8 mg via INTRAVENOUS
  Filled 2011-10-05: qty 10

## 2011-10-05 MED ORDER — MIDAZOLAM HCL 5 MG/ML IJ SOLN: 5.0000 mg | Freq: Once | INTRAMUSCULAR | Status: AC

## 2011-10-05 MED ORDER — HYDROCHLOROTHIAZIDE 25 MG PO TABS
25.0000 mg | ORAL_TABLET | Freq: Every day | ORAL | Status: DC
  Administered 2011-10-05 – 2011-10-06 (×2): 25 mg
  Filled 2011-10-05 (×3): qty 1

## 2011-10-05 MED ORDER — ETOMIDATE 2 MG/ML IV SOLN
20.0000 mg | Freq: Once | INTRAVENOUS | Status: AC
  Administered 2011-10-05: 20 mg via INTRAVENOUS

## 2011-10-05 MED ORDER — ATORVASTATIN CALCIUM 20 MG PO TABS
20.0000 mg | ORAL_TABLET | Freq: Every day | ORAL | Status: DC
  Administered 2011-10-05 – 2011-10-18 (×11): 20 mg
  Filled 2011-10-05 (×15): qty 1

## 2011-10-05 MED ORDER — AMLODIPINE BESYLATE 10 MG PO TABS
10.0000 mg | ORAL_TABLET | Freq: Every day | ORAL | Status: DC
  Administered 2011-10-05 – 2011-10-06 (×2): 10 mg
  Filled 2011-10-05 (×3): qty 1

## 2011-10-05 MED ORDER — SODIUM CHLORIDE 0.9 % IV BOLUS (SEPSIS)
500.0000 mL | Freq: Once | INTRAVENOUS | Status: AC
  Administered 2011-10-05: 500 mL via INTRAVENOUS

## 2011-10-05 MED ORDER — PROPOFOL 10 MG/ML IV EMUL
5.0000 ug/kg/min | INTRAVENOUS | Status: DC
  Administered 2011-10-05 – 2011-10-08 (×6): 10 ug/kg/min via INTRAVENOUS
  Administered 2011-10-09 – 2011-10-11 (×6): 15 ug/kg/min via INTRAVENOUS
  Administered 2011-10-11: 20 ug/kg/min via INTRAVENOUS
  Administered 2011-10-11: 15 ug/kg/min via INTRAVENOUS
  Administered 2011-10-12: 20 ug/kg/min via INTRAVENOUS
  Filled 2011-10-05 (×19): qty 100

## 2011-10-05 MED ORDER — SODIUM CHLORIDE 0.9 % IV SOLN
50.0000 ug/h | INTRAVENOUS | Status: DC
  Administered 2011-10-05 – 2011-10-06 (×2): 50 ug/h via INTRAVENOUS
  Administered 2011-10-08: 75 ug/h via INTRAVENOUS
  Administered 2011-10-09 – 2011-10-10 (×2): 65 ug/h via INTRAVENOUS
  Administered 2011-10-12: 50 ug/h via INTRAVENOUS
  Filled 2011-10-05 (×6): qty 50

## 2011-10-05 MED ORDER — MIDAZOLAM HCL 2 MG/2ML IJ SOLN
INTRAMUSCULAR | Status: AC
  Administered 2011-10-05: 2 mg
  Filled 2011-10-05: qty 4

## 2011-10-05 MED ORDER — CHLORHEXIDINE GLUCONATE 0.12 % MT SOLN
15.0000 mL | Freq: Two times a day (BID) | OROMUCOSAL | Status: DC
  Administered 2011-10-05 – 2011-10-22 (×34): 15 mL via OROMUCOSAL
  Filled 2011-10-05 (×36): qty 15

## 2011-10-05 MED ORDER — ROCURONIUM BROMIDE 50 MG/5ML IV SOLN
50.0000 mg | Freq: Once | INTRAVENOUS | Status: AC
  Administered 2011-10-05: 50 mg via INTRAVENOUS
  Filled 2011-10-05: qty 5

## 2011-10-05 NOTE — Op Note (Signed)
Date of procedure: 10/05/2011  Date of dictation: Same  Service: Neurosurgery  Preoperative diagnosis: Obstructive hydrocephalus  Postoperative diagnosis: Same  Procedure Name: Right frontal ventriculostomy  Surgeon:Meria Crilly A.Talon Regala, M.D.  Asst. Surgeon: None  Anesthesia: General  Indication: 46 year old male status post hypertensive basal ganglia hemorrhage with secondary ventricular obstruction presents for right frontal ventriculostomy.  Operative note: Patient is sedated in the ICU on a ventilator. Right frontal scalp was prepped and draped sterilely. Incision made approximately 1 cm anterior to the coronal suture in the midpupillary line. Twist roll hole made through the skull. Dura pierced with a spinal needle. Ventricular catheter introduced into the lateral ventricle at approximately 7/2 cm. Strong return of CSF under pressure was returned. Catheter was secured. Catheter was attached to a external drainage system. Sterile dressing was placed. No apparent complications. Patient tolerated the procedure well. He remains in the intensive care unit.

## 2011-10-05 NOTE — Progress Notes (Signed)
Radiology report called to CCM NP.  Central line ok to use per Canary Brim, NP

## 2011-10-05 NOTE — Consult Note (Signed)
Reason for Consult: Intracerebral hemorrhage with secondary hydrocephalus Referring Physician: Stroke service  Jeremy [name] is an 46 y.o. male.  HPI: 46 year old male noncompliant on antihypertensive medicines presents with a hypertensive right basal ganglia hemorrhage with extension to the right thalamus and posterior third ventricle. Patient is become increasingly somnolent over the last 24 hours. Intubated for airway control today. Serial scans demonstrated no evidence of further hemorrhage however the patient does have moderate ventriculomegaly with significant dilation of his temporal horns bilaterally, worrisome for obstructive hydrocephalus. And basilar cisterns are tight. Minimal sulcal markings along the periphery of the hemisphere also worrisome for increased intracranial pressure.  Past Medical History  Diagnosis Date  . Hypertension   . Stroke     History reviewed. No pertinent past surgical history.  History reviewed. No pertinent family history.  Social History:  reports that he has never smoked. He does not have any smokeless tobacco history on file. He reports that he does not drink alcohol or use illicit drugs.  Allergies: No Known Allergies  Medications: I have reviewed the patient's current medications.  Results for orders placed during the hospital encounter of 10/01/11 (from the past 48 hour(s))  GLUCOSE, CAPILLARY     Status: Abnormal   Collection Time   10/03/11 12:03 PM      Component Value Range Comment   Glucose-Capillary 118 (*) 70 - 99 mg/dL   GLUCOSE, CAPILLARY     Status: Abnormal   Collection Time   10/03/11  3:46 PM      Component Value Range Comment   Glucose-Capillary 138 (*) 70 - 99 mg/dL   GLUCOSE, CAPILLARY     Status: Abnormal   Collection Time   10/03/11  7:50 PM      Component Value Range Comment   Glucose-Capillary 115 (*) 70 - 99 mg/dL   GLUCOSE, CAPILLARY     Status: Abnormal   Collection Time   10/03/11 11:38 PM      Component Value  Range Comment   Glucose-Capillary 135 (*) 70 - 99 mg/dL    Comment 1 Documented in Chart      Comment 2 Notify RN     GLUCOSE, CAPILLARY     Status: Abnormal   Collection Time   10/04/11  3:04 AM      Component Value Range Comment   Glucose-Capillary 145 (*) 70 - 99 mg/dL    Comment 1 Documented in Chart      Comment 2 Notify RN     CBC     Status: Abnormal   Collection Time   10/04/11  5:10 AM      Component Value Range Comment   WBC 15.7 (*) 4.0 - 10.5 K/uL    RBC 4.57  4.22 - 5.81 MIL/uL    Hemoglobin 13.7  13.0 - 17.0 g/dL    HCT 40.9  81.1 - 91.4 %    MCV 87.7  78.0 - 100.0 fL    MCH 30.0  26.0 - 34.0 pg    MCHC 34.2  30.0 - 36.0 g/dL    RDW 78.2  95.6 - 21.3 %    Platelets 325  150 - 400 K/uL   BASIC METABOLIC PANEL     Status: Abnormal   Collection Time   10/04/11  5:10 AM      Component Value Range Comment   Sodium 130 (*) 135 - 145 mEq/L    Potassium 3.5  3.5 - 5.1 mEq/L    Chloride 91 (*)  96 - 112 mEq/L    CO2 29  19 - 32 mEq/L    Glucose, Bld 172 (*) 70 - 99 mg/dL    BUN 13  6 - 23 mg/dL    Creatinine, Ser 4.09  0.50 - 1.35 mg/dL    Calcium 81.1  8.4 - 10.5 mg/dL    GFR calc non Af Amer >90  >90 mL/min    GFR calc Af Amer >90  >90 mL/min   GLUCOSE, CAPILLARY     Status: Abnormal   Collection Time   10/04/11  7:57 AM      Component Value Range Comment   Glucose-Capillary 140 (*) 70 - 99 mg/dL   GLUCOSE, CAPILLARY     Status: Abnormal   Collection Time   10/04/11 11:22 AM      Component Value Range Comment   Glucose-Capillary 123 (*) 70 - 99 mg/dL   GLUCOSE, CAPILLARY     Status: Abnormal   Collection Time   10/04/11  4:38 PM      Component Value Range Comment   Glucose-Capillary 132 (*) 70 - 99 mg/dL   GLUCOSE, CAPILLARY     Status: Abnormal   Collection Time   10/04/11  8:01 PM      Component Value Range Comment   Glucose-Capillary 131 (*) 70 - 99 mg/dL   GLUCOSE, CAPILLARY     Status: Abnormal   Collection Time   10/04/11 11:36 PM      Component Value  Range Comment   Glucose-Capillary 132 (*) 70 - 99 mg/dL   BASIC METABOLIC PANEL     Status: Abnormal   Collection Time   10/05/11  4:15 AM      Component Value Range Comment   Sodium 131 (*) 135 - 145 mEq/L    Potassium 3.2 (*) 3.5 - 5.1 mEq/L    Chloride 90 (*) 96 - 112 mEq/L    CO2 31  19 - 32 mEq/L    Glucose, Bld 120 (*) 70 - 99 mg/dL    BUN 14  6 - 23 mg/dL    Creatinine, Ser 9.14  0.50 - 1.35 mg/dL    Calcium 78.2  8.4 - 10.5 mg/dL    GFR calc non Af Amer >90  >90 mL/min    GFR calc Af Amer >90  >90 mL/min   CBC     Status: Abnormal   Collection Time   10/05/11  4:15 AM      Component Value Range Comment   WBC 12.3 (*) 4.0 - 10.5 K/uL    RBC 4.92  4.22 - 5.81 MIL/uL    Hemoglobin 14.7  13.0 - 17.0 g/dL    HCT 95.6  21.3 - 08.6 %    MCV 86.0  78.0 - 100.0 fL    MCH 29.9  26.0 - 34.0 pg    MCHC 34.8  30.0 - 36.0 g/dL    RDW 57.8  46.9 - 62.9 %    Platelets 306  150 - 400 K/uL   GLUCOSE, CAPILLARY     Status: Abnormal   Collection Time   10/05/11  8:27 AM      Component Value Range Comment   Glucose-Capillary 104 (*) 70 - 99 mg/dL     Ct Head Wo Contrast  10/05/2011  *RADIOLOGY REPORT*  Clinical Data: Acute thalamic hemorrhage  CT HEAD WITHOUT CONTRAST  Technique:  Contiguous axial images were obtained from the base of the skull through the vertex without contrast.  Comparison: Multiple exams, including 10/03/2011 and 10/02/2011  Findings: Essentially stable size of the right thalamic hemorrhage of 3.8 x 1.8 cm on image 17, with stable appearance of some decompression into the third ventricle and a small amount of hemorrhage in the occipital horn left lateral ventricle stable mild enlargement of the lateral ventricles including the temporal horns.  No new hemorrhage identified.  No independent acute CVA or separate mass lesion is observed.  Mucous retention cyst noted in the right maxillary sinus.  IMPRESSION:  1.  Essentially stable appearance of acute right thalamic hemorrhage,  with a small amount of hemorrhage in the third ventricle and occipital horn of the left lateral ventricle.  Mild ventriculomegaly is likewise stable.   Original Report Authenticated By: Dellia Cloud, M.D.    Mr Brain Wo Contrast  10/03/2011  *RADIOLOGY REPORT*  Clinical Data: 46 year old male with intracranial hemorrhage. Increased headache and left-sided weakness.  Confusion.  MRI HEAD WITHOUT CONTRAST  Technique:  Multiplanar, multiecho pulse sequences of the brain and surrounding structures were obtained according to standard protocol without intravenous contrast.  Comparison: Head CTs 10/02/2011 and earlier.  Findings: Diffusion weighted images show artifact associated with the thalamic hemorrhage.  No larger area of restricted diffusion. Diffusion signal outside of the right thalamus is within normal limits.  Hemorrhage centered within the right thalamus with surrounding edema, and expansion of the thalamus, is dark on T2 and isointense on T1 weighted images encompassing 18 x 37 x 30 mm (AP by transverse by CC, estimated volume 10 ml).  Edema tracks partially into the midbrain and right corona radiata out as before.  There is mass effect on the third ventricle, and mild enlargement of the lateral ventricles is stable.  No definite transependymal flow.  No additional intracranial hemorrhage identified, trace third ventricle and left occipital horn intraventricular hemorrhage is evident on T2*.  Patchy white matter T2 and FLAIR hyperintensity. Major intracranial vascular flow voids are preserved.  Negative pituitary and cervicomedullary junction.  Grossly negative visualized cervical spine.  Bone marrow signal appears normal.  Grossly negative orbit. Right maxillary sinus mucous retention cyst.  Fluid in the right mastoids.  Grossly negative nasopharynx.  IMPRESSION: 1.  Acute hemorrhage (deoxyhemoglobin stage) expanding the right thalamus plus surrounding edema and mass effect is stable since  yesterday.  Estimated blood volume 10 mL. 2.  Trace intraventricular hemorrhage, effacement of the third ventricle, and mild enlargement of the lateral ventricles is stable.  No definite transependymal edema at this time.  Original Report Authenticated By: Harley Hallmark, M.D.   Dg Chest Port 1 View  10/05/2011  *RADIOLOGY REPORT*  Clinical Data: Left IJ line placement.  PORTABLE CHEST - 1 VIEW  Comparison: 10/05/2011 at 5:52 a.m.  Findings: Endotracheal tube tip is 3.1 cm above the carina.  The left IJ line extends across the midline with tip projecting over the right subclavian vein.  No pneumothorax is observed.  Nasogastric tube coils once in the pharynx or cervical esophagus, but extends to the stomach.  Low lung volumes are present, causing crowding of the pulmonary vasculature.  Mild cardiomegaly noted.  IMPRESSION:  1.  Left IJ line crosses the midline and terminates in the right subclavian vein. 2.  No pneumothorax. 3.  Endotracheal tube appears satisfactorily positioned. Nasogastric tube coils once in the cervical esophagus, but terminates in the stomach. 4.  Low lung volumes.   Original Report Authenticated By: Dellia Cloud, M.D.    Dg Chest [facility]  10/05/2011  *RADIOLOGY REPORT*  Clinical Data: Cardiomegaly.  PORTABLE CHEST - 1 VIEW  Comparison: 10/03/2011  Findings: Portable view of the chest demonstrates enlargement of the cardiac silhouette.  There are low lung volumes without focal airspace disease.  Trachea is midline.  Bony structures are intact. Negative for a pneumothorax.  IMPRESSION: Enlargement of the cardiac silhouette with low lung volumes.  No focal lung disease.   Original Report Authenticated By: Richarda Overlie, M.D.     Review of Systems  Unable to perform ROS: medical condition   Blood pressure 137/86, pulse 93, temperature 98.7 F (37.1 C), temperature source Oral, resp. rate 16, height 5\' 10"  (1.778 m), weight 121.7 kg (268 lb 4.8 oz), SpO2 100.00%. Physical Exam   Constitutional: He appears well-developed and well-nourished.  HENT:  Head: Normocephalic and atraumatic.  Right Ear: External ear normal.  Left Ear: External ear normal.  Nose: Nose normal.  Mouth/Throat: Oropharynx is clear and moist.  Eyes: Conjunctivae and EOM are normal. Pupils are equal, round, and reactive to light.  Neck: Normal range of motion. Neck supple. No tracheal deviation present. No thyromegaly present.  Cardiovascular: Normal rate, regular rhythm, normal heart sounds and intact distal pulses.  Exam reveals no friction rub.   No murmur heard. Respiratory: Effort normal and breath sounds normal.  GI: Soft. Bowel sounds are normal.  Musculoskeletal: Normal range of motion.  Neurological:       Patient obtunded on ventilator. Will awaken to vigorous stimulation. Will follow commands with his right side. Minimal flexion on the left.  Pupils are 3 mm bilaterally. There sluggishly reactive to light. There is a strong downward gaze preference. Cough and gag reflex intact.  Skin: He is not diaphoretic.    Assessment/Plan: Right basal ganglia hemorrhage with secondary obstructive hydrocephalus. I do believe the patient's worsening neurologic condition is at least in part due to his obstructive hydrocephalus. I discussed situation with his wife. Recommended that he undergo placement of a right frontal ventriculostomy for hopeful improvement of his symptoms. Discussed the risks meds well with this procedure including but not limited to risk of anesthesia bleeding infection stroke coma and even death. Patient wife is aware and wishes to proceed.  Azura Tufaro A 10/05/2011, 11:42 AM

## 2011-10-05 NOTE — Procedures (Signed)
Central Venous Catheter Insertion Procedure Note Seiji Wiswell [id number] [birth year]  Procedure: Insertion of Central Venous Catheter Indications: Assessment of intravascular volume, Drug and/or fluid administration and Frequent blood sampling  Procedure Details Consent: Risks of procedure as well as the alternatives and risks of each were explained to the (patient/caregiver).  Consent for procedure obtained. Time Out: Verified patient identification, verified procedure, site/side was marked, verified correct patient position, special equipment/implants available, medications/allergies/relevent history reviewed, required imaging and test results available.  Performed  Maximum sterile technique was used including antiseptics, cap, gloves, gown, hand hygiene, mask and sheet. Skin prep: Chlorhexidine; local anesthetic administered A antimicrobial bonded/coated triple lumen catheter was placed in the left internal jugular vein using the Seldinger technique -sutured at 20 cm.  Evaluation Blood flow good Complications: No apparent complications Patient did tolerate procedure well. Chest X-ray ordered to verify placement.  CXR: pending.  Procedure performed under direct supervision of Dr. Delford Field and with ultrasound guidance.     Canary Brim, NP-C [facility] Pgr: [phone] or [phone]   I was present for this procedure. [name]

## 2011-10-05 NOTE — Progress Notes (Signed)
Name: Jeremy [name] MRN: [id number] DOB: [birth year]    LOS: 4  Referring Provider:  Neuro Reason for Referral:  CVA    PULMONARY / CRITICAL CARE MEDICINE  HPI:  46 yo AAM with PMH of HTN presented to [facility] ED on 8/16 via EMS as a code stroke.  He woke up at 7 am and was unable to ambulate to bathroom, use left hand and was incontinent of urine.  He apparently woke several times during the night to urinate (last at 4am).  He noted right sided headache and vomited in the ambulance.  CT eval demonstrated R basal banglia hemorrhage with minimal surrounding edema.  PCCM consulted for ICU mgmt.     Brief patient description:  46 y/o AAM with HTN admitted with R basal ganglia hemorrhage.   Events Since Admission: 8/16 Admit with basal ganglia hemorrhage  Subjective:  Very somnolent this am.  Not agitated overnight.     Vital Signs: Temp:  [98.7 F (37.1 C)-99.5 F (37.5 C)] 98.7 F (37.1 C) (08/20 0800) Pulse Rate:  [68-140] 87  (08/20 0800) Resp:  [18-26] 24  (08/20 0800) BP: (135-192)/(76-102) 144/85 mmHg (08/20 0800) SpO2:  [94 %-100 %] 99 % (08/20 0800) Weight:  [121.7 kg (268 lb 4.8 oz)] 121.7 kg (268 lb 4.8 oz) (08/20 0446)  Physical Examination:  WGN:[id number] except to deep pain HEENT: NCAT, PERRL, EOMi,  PULM: CTA B CV: RRR, no mgr, no JVD AB: BS+, soft, nontender, no hsm Ext: warm, no edema, no clubbing, no cyanosis Derm: no rash or skin breakdown Neuro: somnolent , will only arouse to deep stim   Principal Problem:  *Malignant hypertension requiring acute intensive management Active Problems:  ICH (intracerebral hemorrhage)  HTN (hypertension)  Hypoxemia  Altered mental status  Cytotoxic cerebral edema  Obstructive hydrocephalus   ASSESSMENT AND PLAN  NEUROLOGIC 8/20  CT Head >> R thalamic hematoma slightly enlarged with surrounding edema; 2mm midline shift, no change to sl smaller volume of blood    A:   Acute R Basal Ganglia Hemorrhage -hemorrhage  with small amt edema   P:   Hold all sedating meds   PULMONARY  Lab 10/01/11 1750 10/01/11 1102  PHART 7.403 7.345*  PCO2ART 41.3 51.0*  PO2ART 84.1 99.3  HCO3 25.3* 27.1*  O2SAT 96.8 98.5   Ventilator Settings:   CXR:  CM   ETT:  none  A:   No acute issues, poss OSA, would not wear cpap   P:   -supportive care, O2 to keep sats  -aspiration precautions -monitor respiratory status closely with CVA and lethargy -high risk need for airway  - keep in icu  CARDIOVASCULAR  Lab 10/02/11 2206 10/01/11 1013 10/01/11 0742  TROPONINI <0.30 -- <0.30  LATICACIDVEN -- -- --  PROBNP -- 36.5 --   ECG:  SR  Lines: PIV 8/16 TTE >> limited study, LVEF 60-70%  A: HTN  P:  -Cont PO anti-HTN  -labetalol d/c -prn hydralazine   RENAL  Lab 10/05/11 0415 10/04/11 0510 10/02/11 2206 10/02/11 0515 10/01/11 1004  NA 131* 130* 136 136 139  K 3.2* 3.5 -- -- --  CL 90* 91* 100 102 104  CO2 31 29 24 24 30   BUN 14 13 12 9 9   CREATININE 0.81 0.87 0.83 0.80 0.90  CALCIUM 10.1 10.0 9.7 9.4 9.2  MG -- -- -- 2.0 2.1  PHOS -- -- -- 2.6 1.5*    Intake/Output Summary (Last 24 hours) at 10/05/11 0845 Last data  filed at 10/05/11 0800  Gross per 24 hour  Intake    385 ml  Output   1990 ml  Net  -1605 ml    Foley:  8/16>>>  A:   Hyponatremia ?SIADH P:   -trend BMP  GASTROINTESTINAL  Lab 10/02/11 2206 10/01/11 1004 10/01/11 0742  AST 24 23 24   ALT 22 23 23   ALKPHOS 96 104 109  BILITOT 0.3 0.2* 0.2*  PROT 8.6* 8.6* 8.5*  ALBUMIN 3.5 3.6 3.5    A:   NPO Rule out Dysphagia  P:   -passed swallow eval   HEMATOLOGIC  Lab 10/05/11 0415 10/04/11 0510 10/02/11 2206 10/02/11 0515 10/01/11 1004 10/01/11 0742  HGB 14.7 13.7 12.7* 13.5 12.9* --  HCT 42.3 40.1 38.1* 39.7 39.2 --  PLT 306 325 312 295 262 --  INR -- -- -- -- 1.09 1.04  APTT -- -- -- -- 30 31   A:   No acute issues  P:  -monitor cbc, PT/INR  INFECTIOUS  Lab 10/05/11 0415 10/04/11 0510 10/02/11 2206  10/02/11 0515 10/01/11 1004 10/01/11 0951  WBC 12.3* 15.7* 15.4* 11.4* 6.2 --  PROCALCITON -- -- -- -- -- <0.10   Cultures: none  Antibiotics: none  A:   No acute infectious process  P:   -wbc trending up but remains afebrile  -cont monitor and hold ABX  ENDOCRINE  Lab 10/05/11 0827 10/04/11 2336 10/04/11 2001 10/04/11 1638 10/04/11 1122  GLUCAP 104* 132* 131* 132* 123*   A:   Hyperglycemia  P:   -SSI CBG for tighter control with CVA -check A1c   BEST PRACTICE / DISPOSITION Level of Care:  ICU Primary Service:  PCCM Consultants:  Neurology Code Status:  Full Diet:  PO DVT Px:  SCD's GI Px:  ppi Skin Integrity:  intact Social / Family: no family at bedside AM rounds  Cc 30   [name] Beeper  [phone]  Cell  [phone]  If no response or cell goes to voicemail, call beeper [phone]  10/05/2011  8:45 AM

## 2011-10-05 NOTE — Procedures (Signed)
Intubation Procedure Note Kupono Marling [id number] 04-Sep-1965  Procedure: Intubation Indications: Airway protection and maintenance  Procedure Details Consent: Risks of procedure as well as the alternatives and risks of each were explained to the (patient/caregiver).  Consent for procedure obtained. Time Out: Verified patient identification, verified procedure, site/side was marked, verified correct patient position, special equipment/implants available, medications/allergies/relevent history reviewed, required imaging and test results available.  Performed  4 Glidescope size 4  Evaluation Hemodynamic Status: BP stable throughout; O2 sats: stable throughout Patient's Current Condition: stable Complications: No apparent complications Patient did tolerate procedure well. Chest X-ray ordered to verify placement.  CXR: pending.   [name] 10/05/2011

## 2011-10-05 NOTE — Progress Notes (Signed)
OT / PT Discharge Note  Patient is being discharged from OT/ PTservices secondary to:    Medical decline- will need to re-order to resume therapy services.  Please see latest Therapy Progress Note for current level of functioning and progress toward goals.  Progress and discharge plan and discussed with patient/caregiver and they    Now intubated/ sedated   Patient unable to participate in discharge planning    Lucile Shutters   OTR/L Pager: [phone] Office: [phone] .

## 2011-10-05 NOTE — Progress Notes (Signed)
SLP Cancellation Note  Treatment cancelled today due to medical issues with patient which prohibited therapy.  Per RN, patient being intubated today due to continued decreased mentation and responsiveness. SLP signing off. Please re-consult when extubated and appropriate.   Ferdinand Lango MA, CCC-SLP [phone]   Carnelius Hammitt Meryl 10/05/2011, 9:09 AM

## 2011-10-05 NOTE — Progress Notes (Signed)
I collaborated on this note. 10/05/2011  Holmen Bing, PT [phone] (pager)

## 2011-10-05 NOTE — Progress Notes (Signed)
Stroke Team Progress Note  SUBJECTIVE [name] is a 46 y.o. male  who had been c/o of headaches for the past 7 days, with nausea and vomiting,  On 10/01/11 morning according to his wife he had visual disturbance and then he vomited followed by left sided weakness approximately 7 AM.  Patient arrived to the ED. Head CT was done that showed, Right BG, bleed with some area involved in thalamus. With minimal mass effect.  According to the wife he has stopped taking his BP medications several years ago and does not go the physician as Suggested. Previously, he had significant neurological symptoms with significant elevated BP that could have been attributed to Hypertensive encephalopathy.       He remains groggy in bed. Has not received sedation during the night. His effort on exam is poor due to condition.       Most recent Vital Signs: BP: 146/85 mmHg (08/20 0600) Pulse Rate: 97  (08/20 0600) Respiratory Rate: 21 O2 Saturdation: 95%  CBG (last 3)   Basename 10/04/11 2336 10/04/11 2001 10/04/11 1638  GLUCAP 132* 131* 132*   Intake/Output from previous day: 08/19 0701 - 08/20 0700 In: 385 [P.O.:155; I.V.:230] Out: 1990 [Urine:1990]  IV Fluid Intake:      . sodium chloride 10 mL/hr (10/05/11 0445)   Diet: Carb Control    Activity: bedrest DVT Prophylaxis:  Lovenox  Studies: Results for orders placed during the hospital encounter of 10/01/11 (from the past 24 hour(s))  GLUCOSE, CAPILLARY     Status: Abnormal   Collection Time   10/04/11 11:22 AM      Component Value Range   Glucose-Capillary 123 (*) 70 - 99 mg/dL  GLUCOSE, CAPILLARY     Status: Abnormal   Collection Time   10/04/11  4:38 PM      Component Value Range   Glucose-Capillary 132 (*) 70 - 99 mg/dL  GLUCOSE, CAPILLARY     Status: Abnormal   Collection Time   10/04/11  8:01 PM      Component Value Range   Glucose-Capillary 131 (*) 70 - 99 mg/dL  GLUCOSE, CAPILLARY     Status: Abnormal   Collection  Time   10/04/11 11:36 PM      Component Value Range   Glucose-Capillary 132 (*) 70 - 99 mg/dL  BASIC METABOLIC PANEL     Status: Abnormal   Collection Time   10/05/11  4:15 AM      Component Value Range   Sodium 131 (*) 135 - 145 mEq/L   Potassium 3.2 (*) 3.5 - 5.1 mEq/L   Chloride 90 (*) 96 - 112 mEq/L   CO2 31  19 - 32 mEq/L   Glucose, Bld 120 (*) 70 - 99 mg/dL   BUN 14  6 - 23 mg/dL   Creatinine, Ser 1.61  0.50 - 1.35 mg/dL   Calcium 09.6  8.4 - 04.5 mg/dL   GFR calc non Af Amer >90  >90 mL/min   GFR calc Af Amer >90  >90 mL/min  CBC     Status: Abnormal   Collection Time   10/05/11  4:15 AM      Component Value Range   WBC 12.3 (*) 4.0 - 10.5 K/uL   RBC 4.92  4.22 - 5.81 MIL/uL   Hemoglobin 14.7  13.0 - 17.0 g/dL   HCT 40.9  81.1 - 91.4 %   MCV 86.0  78.0 - 100.0 fL   MCH 29.9  26.0 -  34.0 pg   MCHC 34.8  30.0 - 36.0 g/dL   RDW 40.9  81.1 - 91.4 %   Platelets 306  150 - 400 K/uL     Mr Brain Wo Contrast  10/03/2011  *RADIOLOGY REPORT*  Clinical Data: 46 year old male with intracranial hemorrhage. Increased headache and left-sided weakness.  Confusion.  MRI HEAD WITHOUT CONTRAST  Technique:  Multiplanar, multiecho pulse sequences of the brain and surrounding structures were obtained according to standard protocol without intravenous contrast.  Comparison: Head CTs 10/02/2011 and earlier.  Findings: Diffusion weighted images show artifact associated with the thalamic hemorrhage.  No larger area of restricted diffusion. Diffusion signal outside of the right thalamus is within normal limits.  Hemorrhage centered within the right thalamus with surrounding edema, and expansion of the thalamus, is dark on T2 and isointense on T1 weighted images encompassing 18 x 37 x 30 mm (AP by transverse by CC, estimated volume 10 ml).  Edema tracks partially into the midbrain and right corona radiata out as before.  There is mass effect on the third ventricle, and mild enlargement of the lateral  ventricles is stable.  No definite transependymal flow.  No additional intracranial hemorrhage identified, trace third ventricle and left occipital horn intraventricular hemorrhage is evident on T2*.  Patchy white matter T2 and FLAIR hyperintensity. Major intracranial vascular flow voids are preserved.  Negative pituitary and cervicomedullary junction.  Grossly negative visualized cervical spine.  Bone marrow signal appears normal.  Grossly negative orbit. Right maxillary sinus mucous retention cyst.  Fluid in the right mastoids.  Grossly negative nasopharynx.  IMPRESSION: 1.  Acute hemorrhage (deoxyhemoglobin stage) expanding the right thalamus plus surrounding edema and mass effect is stable since yesterday.  Estimated blood volume 10 mL. 2.  Trace intraventricular hemorrhage, effacement of the third ventricle, and mild enlargement of the lateral ventricles is stable.  No definite transependymal edema at this time.  Original Report Authenticated By: Harley Hallmark, M.D.   Dg Chest Port 1 View  10/05/2011  *RADIOLOGY REPORT*  Clinical Data: Cardiomegaly.  PORTABLE CHEST - 1 VIEW  Comparison: 10/03/2011  Findings: Portable view of the chest demonstrates enlargement of the cardiac silhouette.  There are low lung volumes without focal airspace disease.  Trachea is midline.  Bony structures are intact. Negative for a pneumothorax.  IMPRESSION: Enlargement of the cardiac silhouette with low lung volumes.  No focal lung disease.   Original Report Authenticated By: Richarda Overlie, M.D.     Physical Exam:   Pleasant middle-aged [place] American gentleman who appears currently not to be in distress.  Awake alert. Afebrile. Head is nontraumatic. Neck is supple without bruit. Hearing is normal. Cardiac exam no murmur or gallop. Lungs are clear to auscultation. Distal pulses are well felt.  Neurological Exam :   Sleepy this a.m. but can be barely aroused and speaks a few words .  Marland Kitchen There is dense left homonymous  hemianopsia. Mild right gaze preference with skew eye deviation with right eye downward and rightward deviated. Vertical gaze paralysis. Pupils 4 mm sluggishly reactive. Mild left lower facial weakness. Tongue is midline. Significant left hemiparesis with 1/5 strength in the left approximately and 2/5 strength in the left lower extremity but poor effort. Diminished sensation on the left. Left plantar is upgoing. Right is downgoing. Coordination is impaired on the left. Gait was not tested. ASSESSMENT [name] is a 46 y.o. male with hypertensive right thalamic intracerebral hemorrhage with intraventricular extension with mild hydrocephalus and cytotoxic  cerebral edema. History of medication noncompliance. Malignant hypertension  Hospital day # 4  TREATMENT/PLAN Aggressive blood pressure control. Continue to monitor blood pressure aggressively and mobilize out of bed.Patient has had a decline in neurological status and even though CT appears stable I am concerned about his protecting his airway and willl d/w Dr Delford Field for elective intubation and Dr Jordan Likes neurosurgeon for possible ventriculostomy.  D/W Dr Delford Field PCCM who we discussed airway protection in this patient. Dr Pearlean Brownie plans to discuss with Dr.Poole re: ventric in patient. Patient is not as responsive today and this is a significant concern as above. Keep NPO.  This patient is critically ill and at significant risk of neurological worsening, death and care requires constant monitoring of vital signs, hemodynamics,respiratory and cardiac monitoring,review of multiple databases, neurological assessment, discussion with family, other specialists and medical decision making of high complexity. I spent 30 minutes of neurocritical care time  in the care of  this patient.    Guy Franco, PAC,  MBA, MHA [facility] Pager: [phone] 10/05/2011 8:11 AM  Scribe for Dr. Delia Heady, Stroke Center Medical Director. He has  personally reviewed chart, pertinent data, examined the patient and developed the plan of care. Delia Heady, MD Medical Director [facility] Pager: [phone] 10/05/2011 10:44 AM

## 2011-10-06 ENCOUNTER — Inpatient Hospital Stay (HOSPITAL_COMMUNITY): Payer: BC Managed Care – PPO

## 2011-10-06 DIAGNOSIS — G932 Benign intracranial hypertension: Secondary | ICD-10-CM | POA: Diagnosis present

## 2011-10-06 LAB — BASIC METABOLIC PANEL
BUN: 28 mg/dL — ABNORMAL HIGH (ref 6–23)
BUN: 31 mg/dL — ABNORMAL HIGH (ref 6–23)
CO2: 29 mEq/L (ref 19–32)
CO2: 28 mEq/L (ref 19–32)
Calcium: 9.2 mg/dL (ref 8.4–10.5)
Calcium: 9.4 mg/dL (ref 8.4–10.5)
Chloride: 95 mEq/L — ABNORMAL LOW (ref 96–112)
Chloride: 97 mEq/L (ref 96–112)
Creatinine, Ser: 0.95 mg/dL (ref 0.50–1.35)
Creatinine, Ser: 0.94 mg/dL (ref 0.50–1.35)
GFR calc Af Amer: >90 mL/min (ref >90)
GFR calc Af Amer: >90 mL/min (ref >90)
GFR calc non Af Amer: >90 mL/min (ref >90)
GFR calc non Af Amer: >90 mL/min (ref >90)
Glucose, Bld: 102 mg/dL — ABNORMAL HIGH (ref 70–99)
Glucose, Bld: 133 mg/dL — ABNORMAL HIGH (ref 70–99)
Potassium: 3.2 mEq/L — ABNORMAL LOW (ref 3.5–5.1)
Potassium: 3.4 mEq/L — ABNORMAL LOW (ref 3.5–5.1)
Sodium: 133 mEq/L — ABNORMAL LOW (ref 135–145)
Sodium: 135 mEq/L (ref 135–145)

## 2011-10-06 LAB — CBC
HCT: 38.3 % — ABNORMAL LOW (ref 39.0–52.0)
Hemoglobin: 12.9 g/dL — ABNORMAL LOW (ref 13.0–17.0)
MCH: 29 pg (ref 26.0–34.0)
MCHC: 33.7 g/dL (ref 30.0–36.0)
MCV: 86.1 fL (ref 78.0–100.0)
Platelets: 263 10*3/uL (ref 150–400)
RBC: 4.45 MIL/uL (ref 4.22–5.81)
RDW: 13 % (ref 11.5–15.5)
WBC: 12.7 10*3/uL — ABNORMAL HIGH (ref 4.0–10.5)

## 2011-10-06 LAB — GLUCOSE, CAPILLARY
Glucose-Capillary: 93 mg/dL (ref 70–99)
Glucose-Capillary: 84 mg/dL (ref 70–99)
Glucose-Capillary: 87 mg/dL (ref 70–99)
Glucose-Capillary: 102 mg/dL — ABNORMAL HIGH (ref 70–99)

## 2011-10-06 LAB — CARDIAC PANEL(CRET KIN+CKTOT+MB+TROPI)
CK, MB: 4 ng/mL (ref 0.3–4.0)
CK, MB: 3 ng/mL (ref 0.3–4.0)
Relative Index: 1 (ref 0.0–2.5)
Relative Index: 0.5 (ref 0.0–2.5)
Total CK: 409 U/L — ABNORMAL HIGH (ref 7–232)
Total CK: 554 U/L — ABNORMAL HIGH (ref 7–232)
Troponin I: <0.3 ng/mL (ref <0.30)
Troponin I: <0.3 ng/mL (ref <0.30)

## 2011-10-06 LAB — DRUG SCREEN PANEL (SERUM)

## 2011-10-06 LAB — PROTEIN C, TOTAL: Protein C, Total: 110 % (ref 72–160)

## 2011-10-06 LAB — PROTEIN S, TOTAL: Protein S Ag, Total: 119 % (ref 60–150)

## 2011-10-06 MED ORDER — JEVITY 1.2 CAL PO LIQD
1000.0000 mL | ORAL | Status: DC
  Filled 2011-10-06 (×2): qty 1000

## 2011-10-06 MED ORDER — FUROSEMIDE 10 MG/ML IJ SOLN
20.0000 mg | Freq: Once | INTRAMUSCULAR | Status: AC
  Administered 2011-10-06: 20 mg via INTRAVENOUS

## 2011-10-06 MED ORDER — IPRATROPIUM BROMIDE 0.02 % IN SOLN
0.5000 mg | Freq: Four times a day (QID) | RESPIRATORY_TRACT | Status: DC
  Administered 2011-10-06 (×3): 0.5 mg via RESPIRATORY_TRACT
  Filled 2011-10-06 (×3): qty 2.5

## 2011-10-06 MED ORDER — PROMOTE PO LIQD
1000.0000 mL | ORAL | Status: DC
  Administered 2011-10-06 – 2011-10-09 (×3): 1000 mL
  Filled 2011-10-06 (×11): qty 1000

## 2011-10-06 MED ORDER — ADULT MULTIVITAMIN LIQUID CH
5.0000 mL | Freq: Every day | ORAL | Status: DC
  Administered 2011-10-06 – 2011-10-19 (×11): 5 mL
  Filled 2011-10-06 (×14): qty 5

## 2011-10-06 MED ORDER — IPRATROPIUM-ALBUTEROL 18-103 MCG/ACT IN AERO
6.0000 | INHALATION_SPRAY | Freq: Four times a day (QID) | RESPIRATORY_TRACT | Status: DC
  Administered 2011-10-07 – 2011-10-13 (×26): 6 via RESPIRATORY_TRACT
  Filled 2011-10-06: qty 14.7

## 2011-10-06 MED ORDER — FUROSEMIDE 10 MG/ML IJ SOLN
INTRAMUSCULAR | Status: AC
  Filled 2011-10-06: qty 4

## 2011-10-06 MED ORDER — POTASSIUM CHLORIDE 20 MEQ/15ML (10%) PO LIQD
60.0000 meq | Freq: Once | ORAL | Status: AC
  Administered 2011-10-06: 60 meq
  Filled 2011-10-06: qty 45

## 2011-10-06 MED ORDER — ALBUTEROL SULFATE (5 MG/ML) 0.5% IN NEBU
2.5000 mg | INHALATION_SOLUTION | Freq: Four times a day (QID) | RESPIRATORY_TRACT | Status: DC
  Administered 2011-10-06 (×3): 2.5 mg via RESPIRATORY_TRACT
  Filled 2011-10-06 (×3): qty 0.5

## 2011-10-06 MED ORDER — PRO-STAT SUGAR FREE PO LIQD
60.0000 mL | Freq: Four times a day (QID) | ORAL | Status: DC
  Administered 2011-10-06 – 2011-10-14 (×19): 60 mL
  Filled 2011-10-06 (×36): qty 60

## 2011-10-06 NOTE — Progress Notes (Signed)
Patient ID: Jeremy [name], male   DOB: October 03, 1965, 46 y.o.   MRN: [id number] I was called to see the patient about a tracheostomy drain that had poor output. The patient would open his eyes to stimuli somewhat, he would follow commands by sticking out his tongue, but seemed somnolent. I tried to irrigate the ventriculostomy with no effect. Therefore I called the patient's wife and asked for permission to replace the ventriculostomy drain. Discussed the reasoning for this and its potential benefits and risks and she agreed to proceed. When the drain was removed there was large thick clot within the drain.

## 2011-10-06 NOTE — Progress Notes (Signed)
Stroke Team Progress Note  HISTORY [name] is a 46 y.o. male  who had been c/o of headaches for the past 7 days, with nausea and vomiting,  On 10/01/11 morning according to his wife he had visual disturbance and then he vomited followed by left sided weakness approximately 7 AM. Patient arrived to the ED. Head CT was done that showed, Right BG, bleed with some area involved in thalamus. With minimal mass effect.  According to the wife he has stopped taking his BP medications several years ago and does not go the physician as Suggested. Previously, he had significant neurological symptoms with significant elevated BP that could have been attributed to Hypertensive encephalopathy.   SUBJECTIVE Patient better after IVC placement yesterday, more awake and responsive. IVC draining 10-15 cc/hr at pop off of 15.   Most recent Vital Signs: Temp: 99.5 F (37.5 C) (08/21 0700) Temp src: Oral (08/21 0700) BP: 98/67 mmHg (08/21 0700) Pulse Rate: 91  (08/21 0600) Respiratory Rate: 16 O2 Saturdation: 97%  CBG (last 3)   Basename 10/06/11 0336 10/05/11 2335 10/05/11 1957  GLUCAP 93 98 95   Intake/Output from previous day: 08/20 0701 - 08/21 0700 In: 1892.5 [I.V.:1832.5] Out: 1034 [Urine:850; Drains:184]  IV Fluid Intake:     . sodium chloride 75 mL/hr at 10/05/11 2348  . fentaNYL infusion INTRAVENOUS 50 mcg/hr (10/06/11 0700)  . propofol 10 mcg/kg/min (10/06/11 0700)   Diet: NPO   Activity: bedrest DVT Prophylaxis:  Lovenox 40 mg sq daily   Studies: Results for orders placed during the hospital encounter of 10/01/11 (from the past 24 hour(s))  GLUCOSE, CAPILLARY     Status: Abnormal   Collection Time   10/05/11  8:27 AM      Component Value Range   Glucose-Capillary 104 (*) 70 - 99 mg/dL  GLUCOSE, CAPILLARY     Status: Abnormal   Collection Time   10/05/11 11:43 AM      Component Value Range   Glucose-Capillary 106 (*) 70 - 99 mg/dL  GLUCOSE, CAPILLARY     Status: Normal   Collection Time   10/05/11  4:22 PM      Component Value Range   Glucose-Capillary 82  70 - 99 mg/dL   Comment 1 Notify RN     Comment 2 Documented in Chart    GLUCOSE, CAPILLARY     Status: Normal   Collection Time   10/05/11  7:57 PM      Component Value Range   Glucose-Capillary 95  70 - 99 mg/dL   Comment 1 Notify RN     Comment 2 Documented in Chart    GLUCOSE, CAPILLARY     Status: Normal   Collection Time   10/05/11 11:35 PM      Component Value Range   Glucose-Capillary 98  70 - 99 mg/dL  GLUCOSE, CAPILLARY     Status: Normal   Collection Time   10/06/11  3:36 AM      Component Value Range   Glucose-Capillary 93  70 - 99 mg/dL  BASIC METABOLIC PANEL     Status: Abnormal   Collection Time   10/06/11  3:55 AM      Component Value Range   Sodium 133 (*) 135 - 145 mEq/L   Potassium 3.2 (*) 3.5 - 5.1 mEq/L   Chloride 95 (*) 96 - 112 mEq/L   CO2 29  19 - 32 mEq/L   Glucose, Bld 102 (*) 70 - 99 mg/dL  BUN 28 (*) 6 - 23 mg/dL   Creatinine, Ser 2.59  0.50 - 1.35 mg/dL   Calcium 9.2  8.4 - 56.3 mg/dL   GFR calc non Af Amer >90  >90 mL/min   GFR calc Af Amer >90  >90 mL/min  CBC     Status: Abnormal   Collection Time   10/06/11  3:55 AM      Component Value Range   WBC 12.7 (*) 4.0 - 10.5 K/uL   RBC 4.45  4.22 - 5.81 MIL/uL   Hemoglobin 12.9 (*) 13.0 - 17.0 g/dL   HCT 87.5 (*) 64.3 - 32.9 %   MCV 86.1  78.0 - 100.0 fL   MCH 29.0  26.0 - 34.0 pg   MCHC 33.7  30.0 - 36.0 g/dL   RDW 51.8  84.1 - 66.0 %   Platelets 263  150 - 400 K/uL     Ct Head Wo Contrast  10/05/2011  *RADIOLOGY REPORT*  Clinical Data: Acute thalamic hemorrhage  CT HEAD WITHOUT CONTRAST  Technique:  Contiguous axial images were obtained from the base of the skull through the vertex without contrast.  Comparison: Multiple exams, including 10/03/2011 and 10/02/2011  Findings: Essentially stable size of the right thalamic hemorrhage of 3.8 x 1.8 cm on image 17, with stable appearance of some decompression  into the third ventricle and a small amount of hemorrhage in the occipital horn left lateral ventricle stable mild enlargement of the lateral ventricles including the temporal horns.  No new hemorrhage identified.  No independent acute CVA or separate mass lesion is observed.  Mucous retention cyst noted in the right maxillary sinus.  IMPRESSION:  1.  Essentially stable appearance of acute right thalamic hemorrhage, with a small amount of hemorrhage in the third ventricle and occipital horn of the left lateral ventricle.  Mild ventriculomegaly is likewise stable.   Original Report Authenticated By: Dellia Cloud, M.D.    Dg Chest Port 1 View  10/06/2011  *RADIOLOGY REPORT*  Clinical Data: Evaluate endotracheal tube placement, atelectasis  PORTABLE CHEST - 1 VIEW  Comparison: Portable chest x-ray of 10/05/2011  Findings: There is no change in position of the endotracheal tube. The left IJ central venous line tip overlies the region of the left innominate vein.  There is mild pulmonary vascular congestion present with cardiomegaly.  An NG tube is present.  IMPRESSION:  1.  Poor aeration with mild pulmonary vascular congestion. 2.  Left central venous line tip overlies left innominate vein.   Original Report Authenticated By: Juline Patch, M.D.    Dg Chest Port 1 View  10/05/2011  *RADIOLOGY REPORT*  Clinical Data:  Central line placement  PORTABLE CHEST - 1 VIEW  Comparison: Prior chest x-rays obtained today, 10/05/2011 at 05:52 a.m., and at 10:52 a.m.  Findings: Interval manipulation of the left IJ approach central venous catheter -the catheter has been pulled back.  The tip now projects over the left brachiocephalic vein.  Endotracheal and nasogastric tube remain unchanged.  Similar appearance of the lungs - there are very low inspiratory volumes with patchy bibasilar opacities.  Enlargement of the cardiac and mediastinal contours are also unchanged.  IMPRESSION:  1.  Interval repositioning of left IJ  approach central venous catheter.  The catheter tip projects over the left brachiocephalic vein.  If possible, advancing the catheter over a wire into the superior vena cava would yield a more optimal line placement. Other support apparatus are unchanged.  2. Otherwise, no  significant interval change in the appearance of the chest in short interval since the prior examination.   Original Report Authenticated By: Alvino Blood Chest Port 1 View  10/05/2011  *RADIOLOGY REPORT*  Clinical Data: Left IJ line placement.  PORTABLE CHEST - 1 VIEW  Comparison: 10/05/2011 at 5:52 a.m.  Findings: Endotracheal tube tip is 3.1 cm above the carina.  The left IJ line extends across the midline with tip projecting over the right subclavian vein.  No pneumothorax is observed.  Nasogastric tube coils once in the pharynx or cervical esophagus, but extends to the stomach.  Low lung volumes are present, causing crowding of the pulmonary vasculature.  Mild cardiomegaly noted.  IMPRESSION:  1.  Left IJ line crosses the midline and terminates in the right subclavian vein. 2.  No pneumothorax. 3.  Endotracheal tube appears satisfactorily positioned. Nasogastric tube coils once in the cervical esophagus, but terminates in the stomach. 4.  Low lung volumes.   Original Report Authenticated By: Dellia Cloud, M.D.    Dg Chest Port 1 View  10/05/2011  *RADIOLOGY REPORT*  Clinical Data: Cardiomegaly.  PORTABLE CHEST - 1 VIEW  Comparison: 10/03/2011  Findings: Portable view of the chest demonstrates enlargement of the cardiac silhouette.  There are low lung volumes without focal airspace disease.  Trachea is midline.  Bony structures are intact. Negative for a pneumothorax.  IMPRESSION: Enlargement of the cardiac silhouette with low lung volumes.  No focal lung disease.   Original Report Authenticated By: Richarda Overlie, M.D.     Physical Exam:   Pleasant middle-aged [place] American gentleman who appears currently not to be in distress.   Awake alert. Afebrile. Head is nontraumatic. Neck is supple without bruit. Hearing is normal. Cardiac exam no murmur or gallop. Lungs are clear to auscultaion. Neurological Exam : Drowsy but can be easily  aroused and follows commands. .  . There is decrease blink to threat on left . Mild right gaze preference with skew eye deviation with right eye downward and rightward deviated. Vertical gaze paralysis. Pupils 4 mm sluggishly reactive. Mild left lower facial weakness. Tongue is midline. Significant left hemiparesis with 1/5 strength in the left approximately and 2/5 strength in the left lower extremity but poor effort. Diminished sensation on the left. Left plantar is upgoing. Right is downgoing. Coordination is impaired on the left. Gait was not tested.  ASSESSMENT Mr. Densil Ottey is a 46 y.o. male with hypertensive right thalamic intracerebral hemorrhage with intraventricular extension with mild hydrocephalus and cytotoxic cerebral edema. Neurologically worsened o with IVC placement and intubation yesterday. Today, more awake and interactive, following commands.  -History of medication noncompliance. -accelerated hypertension  Hospital day # 5  Dr. Pearlean Brownie discussed pt/care with Dr. Delford Field  TREATMENT/PLAN -Continue IVC drain at current settings -agree with plans for extubation today -Restart tube feedings following extubation -Restart therapies following extubation and drain removal This patient is critically ill and at significant risk of neurological worsening, death and care requires constant monitoring of vital signs, hemodynamics,respiratory and cardiac monitoring,review of multiple databases, neurological assessment, discussion with family, other specialists and medical decision making of high complexity. I spent 30 minutes of neurocritical care time  in the care of  this patient.  Annie Main, MSN, RN, ANVP-BC, ANP-BC, [facility] Pager: [phone] 10/06/2011  8:17 AM  Scribe for Dr. Delia Heady, Stroke Center Medical Director, who has personally reviewed chart, pertinent data, examined the patient and developed the plan of  care. Pager:  [phone]

## 2011-10-06 NOTE — Progress Notes (Signed)
Overall stable last night without any new problems. Patient will now awake and in voice he appears aware and is much more spontaneous then prior to the ventriculostomy. He will intermittently follow commands on the right side. Still densely paretic on the left. Ventricular output around 10-15 cc per hour set at 15 cm of water. Continue current care. No new recommendations.

## 2011-10-06 NOTE — Progress Notes (Signed)
PCCM  Pt failed SBT with RR 35 .  Pt with excess brown secretions.  Pt c/o chest pain  Plan Full vent for now SBT daily Check ECG/enzymes BD  chck resp c/s Start TF  [name] Beeper  [phone]  Cell  [phone]  If no response or cell goes to voicemail, call beeper [phone]

## 2011-10-06 NOTE — Progress Notes (Signed)
INITIAL ADULT NUTRITION ASSESSMENT Date: 10/06/2011   Time: 11:19 AM Reason for Assessment: TF iniation and management consult  INTERVENTION: Initiate Promote @ 30 ml/hr via OGT. 60 ml Prostat QID.  At goal rate, tube feeding regimen will provide 1520 kcal, 165 grams of protein, and 604 ml of H2O. MVI daily  TF regimen and propofol providing 1712 kcal (73% of needs) and 165 grams protein (>100% of needs).   ASSESSMENT: Male 46 y.o.  Dx: Malignant hypertension requiring acute intensive management  Hx:  Past Medical History  Diagnosis Date  . Hypertension   . Stroke    Related Meds:     . albuterol  2.5 mg Nebulization Q6H  . amLODipine  10 mg Per Tube Daily  . antiseptic oral rinse  15 mL Mouth Rinse QID  . atorvastatin  20 mg Per Tube q1800  . chlorhexidine  15 mL Mouth Rinse BID  . enoxaparin (LOVENOX) injection  40 mg Subcutaneous Q24H  . furosemide      . furosemide  20 mg Intravenous Once  . hydrochlorothiazide  25 mg Per Tube Daily  . insulin aspart  0-15 Units Subcutaneous Q4H  . ipratropium  0.5 mg Nebulization Q6H  . pantoprazole sodium  40 mg Per Tube Q1200  . potassium chloride  60 mEq Per Tube Once  . senna-docusate  1 tablet Oral BID  . vecuronium  8 mg Intravenous Once   Ht: 5\' 10"  (177.8 cm)  Wt: 272 lb 0.8 oz (123.4 kg)  Ideal Wt: 75.4 kg % Ideal Wt: 164%  Usual Wt:  Wt Readings from Last 10 Encounters:  10/06/11 272 lb 0.8 oz (123.4 kg)   % Usual Wt: 100%  Body mass index is 39.03 kg/(m^2). Obesity Class II  Food/Nutrition Related Hx: Per wife pt has had no recent weight changes. He has tried to eat a healthier diet recently. Per wife does not go to the doctor.   Labs:  CMP     Component Value Date/Time   NA 133* 10/06/2011 0355   K 3.2* 10/06/2011 0355   CL 95* 10/06/2011 0355   CO2 29 10/06/2011 0355   GLUCOSE 102* 10/06/2011 0355   BUN 28* 10/06/2011 0355   CREATININE 0.95 10/06/2011 0355   CALCIUM 9.2 10/06/2011 0355   PROT 8.6*  10/02/2011 2206   ALBUMIN 3.5 10/02/2011 2206   AST 24 10/02/2011 2206   ALT 22 10/02/2011 2206   ALKPHOS 96 10/02/2011 2206   BILITOT 0.3 10/02/2011 2206   GFRNONAA >90 10/06/2011 0355   GFRAA >90 10/06/2011 0355   Sodium  Date/Time Value Range Status  10/06/2011  3:55 AM 133* 135 - 145 mEq/L Final  10/05/2011  4:15 AM 131* 135 - 145 mEq/L Final  10/04/2011  5:10 AM 130* 135 - 145 mEq/L Final    Potassium  Date/Time Value Range Status  10/06/2011  3:55 AM 3.2* 3.5 - 5.1 mEq/L Final  10/05/2011  4:15 AM 3.2* 3.5 - 5.1 mEq/L Final  10/04/2011  5:10 AM 3.5  3.5 - 5.1 mEq/L Final    Phosphorus  Date/Time Value Range Status  10/02/2011  5:15 AM 2.6  2.3 - 4.6 mg/dL Final  10/14/5619 30:86 AM 1.5* 2.3 - 4.6 mg/dL Final    Magnesium  Date/Time Value Range Status  10/02/2011  5:15 AM 2.0  1.5 - 2.5 mg/dL Final  5/78/4696 29:52 AM 2.1  1.5 - 2.5 mg/dL Final    Intake/Output Summary (Last 24 hours) at 10/06/11 1120 Last data  filed at 10/06/11 0900  Gross per 24 hour  Intake 1928.86 ml  Output    856 ml  Net 1072.86 ml  Last BM not documented  Diet Order: NPO  Supplements/Tube Feeding: Jevity 1.2  IVF:    sodium chloride Last Rate: 20 mL/hr at 10/06/11 0906  feeding supplement (JEVITY 1.2 CAL)   fentaNYL infusion INTRAVENOUS Last Rate: 50 mcg/hr (10/06/11 0900)  propofol Last Rate: 10 mcg/kg/min (10/06/11 0900)   Patient is currently intubated on ventilator support due to hypertensive right thalamic intracerebral hemorrhage with intraventricular extension with mild hydrocephalus and cytotoxic cerebral edema. MV: 9.5 Temp: Temp (24hrs), Avg:99 F (37.2 C), Min:97.6 F (36.4 C), Max:99.9 F (37.7 C) Propofol: 7.3 ml/hr, providing: 192 kcal/day Pt discussed at morning rounds. Per MD pt better after IVC placement yesterday, more awake. IVC draining 10-15 cc/hr.  History of medical noncompliance.   Estimated Nutritional Needs:   Kcal: 2337 Protein: >/= 150 grams Fluid:  >2.5  L/day  NUTRITION DIAGNOSIS: -Inadequate oral intake (NI-2.1).  Status: Ongoing  RELATED TO: inability to eat  AS EVIDENCE BY: NPO status  MONITORING/EVALUATION(Goals): Goal: Enteral nutrition to provide 60-70% of estimated calorie needs (22-25 kcals/kg ideal body weight) and >/= 90% of estimated protein needs, based on ASPEN guidelines for permissive underfeeding in critically ill obese individuals. Monitor: TF tolerance, vent settings, temp, propofol infusion  EDUCATION NEEDS: -No education needs identified at this time  DOCUMENTATION CODES Per approved criteria  -Obesity Unspecified    Kendell Bane RD, LDN, CNSC [phone] Pager [phone] After Hours Pager  10/06/2011, 11:19 AM

## 2011-10-06 NOTE — Progress Notes (Addendum)
Name: Jeremy [name] MRN: [id number] DOB: 02/22/1965    LOS: 5  Referring Provider:  Neuro Reason for Referral:  CVA    PULMONARY / CRITICAL CARE MEDICINE  HPI:  46 yo AAM with PMH of HTN presented to [facility] ED on 8/16 via EMS as a code stroke.  He woke up at 7 am and was unable to ambulate to bathroom, use left hand and was incontinent of urine.  He apparently woke several times during the night to urinate (last at 4am).  He noted right sided headache and vomited in the ambulance.  CT eval demonstrated R basal banglia hemorrhage with minimal surrounding edema.  PCCM consulted for ICU mgmt.     Brief patient description:  46 y/o AAM with HTN admitted with R basal ganglia hemorrhage.   Events Since Admission: 8/16 Admit with basal ganglia hemorrhage  Subjective:  Intubated 8/20 for airway, today is much more alert with IVC drain.   Vital Signs: Temp:  [97.6 F (36.4 C)-99.9 F (37.7 C)] 99.5 F (37.5 C) (08/21 0700) Pulse Rate:  [76-125] 91  (08/21 0600) Resp:  [16-28] 16  (08/21 0800) BP: (86-148)/(42-101) 113/68 mmHg (08/21 0800) SpO2:  [96 %-100 %] 100 % (08/21 0800) FiO2 (%):  [30 %-100 %] 30 % (08/21 0800) Weight:  [123.4 kg (272 lb 0.8 oz)] 123.4 kg (272 lb 0.8 oz) (08/21 0500)  Physical Examination: ICP 15 even with drain in place  WUJ:WJXB more alert, moves all 4s.   HEENT: NCAT, PERRL, EOMi,  PULM: CTA B CV: RRR, no mgr, no JVD AB: BS+, soft, nontender, no hsm Ext: warm, no edema, no clubbing, no cyanosis Derm: no rash or skin breakdown Neuro: more alert today , moves all ext.  Principal Problem:  *Malignant hypertension requiring acute intensive management Active Problems:  ICH (intracerebral hemorrhage)  HTN (hypertension)  Hypoxemia  Altered mental status  Cytotoxic cerebral edema  Obstructive hydrocephalus  Acute respiratory failure with hypoxia  Elevated intracranial pressure   ASSESSMENT AND PLAN  NEUROLOGIC 8/20  CT Head >> R thalamic hematoma  slightly enlarged with surrounding edema; 2mm midline shift, no change to sl smaller volume of blood   CNS DRAINS: IVC drain 8/20>>  A:   Acute R Basal Ganglia Hemorrhage -hemorrhage with small amt edema Acute resp failure d/t Intraventric blood and hydroceph improve with IVC  ELEVATED ICP with IVC drain. Increased output with drain P:   Cont IVC drain Per neurosurgery   PULMONARY  Lab 10/01/11 1750 10/01/11 1102  PHART 7.403 7.345*  PCO2ART 41.3 51.0*  PO2ART 84.1 99.3  HCO3 25.3* 27.1*  O2SAT 96.8 98.5   Ventilator Settings: Vent Mode:  [-] PRVC FiO2 (%):  [30 %-100 %] 30 % Set Rate:  [16 bmp] 16 bmp Vt Set:  [580 mL] 580 mL PEEP:  [5 cmH20] 5 cmH20 Plateau Pressure:  [10 cmH20-22 cmH20] 22 cmH20 CXR:  CM  Mild vasc edema ETT:  8/20>>  A:   Acute respiratory failure d/t hydroceph d/t intraventric blood with thalamic bleed  P:   -SBT /WUA daily   CARDIOVASCULAR  Lab 10/02/11 2206 10/01/11 1013 10/01/11 0742  TROPONINI <0.30 -- <0.30  LATICACIDVEN -- -- --  PROBNP -- 36.5 --   ECG:  SR  Lines: L IJ CVL 8/20>>> 8/16 TTE >> limited study, LVEF 60-70%  A: HTN  P:  BP meds on hold with sedation meds dropping BP  RENAL  Lab 10/06/11 0355 10/05/11 0415 10/04/11 0510 10/02/11 2206 10/02/11  0515 10/01/11 1004  NA 133* 131* 130* 136 136 --  K 3.2* 3.2* -- -- -- --  CL 95* 90* 91* 100 102 --  CO2 29 31 29 24 24  --  BUN 28* 14 13 12 9  --  CREATININE 0.95 0.81 0.87 0.83 0.80 --  CALCIUM 9.2 10.1 10.0 9.7 9.4 --  MG -- -- -- -- 2.0 2.1  PHOS -- -- -- -- 2.6 1.5*    Intake/Output Summary (Last 24 hours) at 10/06/11 0859 Last data filed at 10/06/11 0800  Gross per 24 hour  Intake 1957.52 ml  Output   1046 ml  Net 911.52 ml    Foley:  8/16>>>  A:   Hyponatremia better with IVC  ?cerebral salt wasting with hydroceph Hypokalemia P:   -replete K -urine lytes  GASTROINTESTINAL  Lab 10/02/11 2206 10/01/11 1004 10/01/11 0742  AST 24 23 24   ALT 22  23 23   ALKPHOS 96 104 109  BILITOT 0.3 0.2* 0.2*  PROT 8.6* 8.6* 8.5*  ALBUMIN 3.5 3.6 3.5    A:   NPO  P:   Hold TF since may extubate 8/21  HEMATOLOGIC  Lab 10/06/11 0355 10/05/11 0415 10/04/11 0510 10/02/11 2206 10/02/11 0515 10/01/11 1004 10/01/11 0742  HGB 12.9* 14.7 13.7 12.7* 13.5 -- --  HCT 38.3* 42.3 40.1 38.1* 39.7 -- --  PLT 263 306 325 312 295 -- --  INR -- -- -- -- -- 1.09 1.04  APTT -- -- -- -- -- 30 31   A:   No acute issues  P:  -monitor cbc -ok on LMWH DVT proph    INFECTIOUS  Lab 10/06/11 0355 10/05/11 0415 10/04/11 0510 10/02/11 2206 10/02/11 0515 10/01/11 0951  WBC 12.7* 12.3* 15.7* 15.4* 11.4* --  PROCALCITON -- -- -- -- -- <0.10   Cultures: 8/16 UC>>neg  Antibiotics: none  A:   No acute infectious process  P:   -wbc trending up but remains afebrile  -cont monitor and hold ABX  ENDOCRINE  Lab 10/06/11 0336 10/05/11 2335 10/05/11 1957 10/05/11 1622 10/05/11 1143  GLUCAP 93 98 95 82 106*   A:   Hyperglycemia  P:   -SSI CBG for tighter control with CVA    BEST PRACTICE / DISPOSITION Level of Care:  ICU Primary Service:  PCCM Consultants:  Neurology Code Status:  Full Diet:  PO DVT Px:  LMWH GI Px:  ppi Skin Integrity:  intact Social / Family: family updated fully 8/21    Cc 30   [name] Beeper  [phone]  Cell  [phone]  If no response or cell goes to voicemail, call beeper [phone]  10/06/2011  8:59 AM

## 2011-10-06 NOTE — Progress Notes (Signed)
Output in IVC has decreased over the last few hours from 10-41ml/hr to 1-32ml/hr.  RN called Dr. Jordan Likes to advise. Ordered for follow up CT. If drain continues to have low output after a few hours, call oncall MD to assess drain.

## 2011-10-06 NOTE — Progress Notes (Signed)
eLink Physician-Brief Progress Note Patient Name: Maxxwell Edgett DOB: 1965-05-12 MRN: [id number]  Date of Service  10/06/2011   HPI/Events of Note  hypokalemia   eICU Interventions  Potassium replaced   Intervention Category Intermediate Interventions: Electrolyte abnormality - evaluation and management  DETERDING,ELIZABETH 10/06/2011, 5:06 AM

## 2011-10-06 NOTE — CV Procedure (Signed)
Ventriculostomy catheter placement note  Jeremy [name] Date of birth 02/24/1965  Procedure: Ventriculostomy placement Surgeon: Dr. Marikay Alar  Procedure details: His right frontal region was prepped with DuraPrep and then draped in the usual sterile fashion. 3 cc of local anesthesia was injected. I then passed the ventriculostomy catheter through the previous burr hole into the right ventricular system with immediate release of red CSF under significant pressure. I passed the catheter through a separate stab incision and connected it to his distal reservoir. I then sewed the catheter into position and placed occlusive dressing. The patient apparently tolerated the procedure well.

## 2011-10-06 NOTE — Progress Notes (Signed)
Dr Yetta Barre called regarding output of 0 cc from ventriculostomy since 1600. Will continue to monitor.  Cyndie Chime Schoolcraft

## 2011-10-07 ENCOUNTER — Inpatient Hospital Stay (HOSPITAL_COMMUNITY): Payer: BC Managed Care – PPO

## 2011-10-07 DIAGNOSIS — R29818 Other symptoms and signs involving the nervous system: Secondary | ICD-10-CM

## 2011-10-07 LAB — CBC
HCT: 36.6 % — ABNORMAL LOW (ref 39.0–52.0)
Hemoglobin: 12.3 g/dL — ABNORMAL LOW (ref 13.0–17.0)
MCH: 29.6 pg (ref 26.0–34.0)
MCHC: 33.6 g/dL (ref 30.0–36.0)
MCV: 88 fL (ref 78.0–100.0)
Platelets: 256 10*3/uL (ref 150–400)
RBC: 4.16 MIL/uL — ABNORMAL LOW (ref 4.22–5.81)
RDW: 13.5 % (ref 11.5–15.5)
WBC: 16 10*3/uL — ABNORMAL HIGH (ref 4.0–10.5)

## 2011-10-07 LAB — CARDIAC PANEL(CRET KIN+CKTOT+MB+TROPI)
CK, MB: 2 ng/mL (ref 0.3–4.0)
Relative Index: 0.4 (ref 0.0–2.5)
Total CK: 491 U/L — ABNORMAL HIGH (ref 7–232)
Troponin I: <0.3 ng/mL (ref <0.30)

## 2011-10-07 LAB — BASIC METABOLIC PANEL
BUN: 32 mg/dL — ABNORMAL HIGH (ref 6–23)
CO2: 30 mEq/L (ref 19–32)
Calcium: 9.5 mg/dL (ref 8.4–10.5)
Chloride: 97 mEq/L (ref 96–112)
Creatinine, Ser: 0.95 mg/dL (ref 0.50–1.35)
GFR calc Af Amer: >90 mL/min (ref >90)
GFR calc non Af Amer: >90 mL/min (ref >90)
Glucose, Bld: 120 mg/dL — ABNORMAL HIGH (ref 70–99)
Potassium: 3.6 mEq/L (ref 3.5–5.1)
Sodium: 137 mEq/L (ref 135–145)

## 2011-10-07 LAB — GLUCOSE, CAPILLARY
Glucose-Capillary: 100 mg/dL — ABNORMAL HIGH (ref 70–99)
Glucose-Capillary: 105 mg/dL — ABNORMAL HIGH (ref 70–99)
Glucose-Capillary: 118 mg/dL — ABNORMAL HIGH (ref 70–99)
Glucose-Capillary: 130 mg/dL — ABNORMAL HIGH (ref 70–99)
Glucose-Capillary: 137 mg/dL — ABNORMAL HIGH (ref 70–99)
Glucose-Capillary: 160 mg/dL — ABNORMAL HIGH (ref 70–99)
Glucose-Capillary: 172 mg/dL — ABNORMAL HIGH (ref 70–99)

## 2011-10-07 LAB — BETA-2-GLYCOPROTEIN I ABS, IGG/M/A
Beta-2 Glyco I IgG: 0 G Units (ref <20)
Beta-2-Glycoprotein I IgA: 7 A Units (ref <20)
Beta-2-Glycoprotein I IgM: 13 M Units (ref <20)

## 2011-10-07 LAB — CARDIOLIPIN ANTIBODIES, IGG, IGM, IGA
Anticardiolipin IgA: 4 APL U/mL — ABNORMAL LOW (ref <22)
Anticardiolipin IgG: 20 GPL U/mL (ref <23)
Anticardiolipin IgM: 5 MPL U/mL — ABNORMAL LOW (ref <11)

## 2011-10-07 MED ORDER — RACEPINEPHRINE HCL 2.25 % IN NEBU
0.5000 mL | INHALATION_SOLUTION | Freq: Once | RESPIRATORY_TRACT | Status: AC
  Administered 2011-10-07: 0.5 mL via RESPIRATORY_TRACT

## 2011-10-07 MED ORDER — RACEPINEPHRINE HCL 2.25 % IN NEBU
INHALATION_SOLUTION | RESPIRATORY_TRACT | Status: AC
  Administered 2011-10-07: 0.5 mL via RESPIRATORY_TRACT
  Filled 2011-10-07: qty 0.5

## 2011-10-07 MED ORDER — MIDAZOLAM HCL 2 MG/2ML IJ SOLN
INTRAMUSCULAR | Status: AC
  Administered 2011-10-07: 2 mg
  Filled 2011-10-07: qty 4

## 2011-10-07 MED ORDER — ETOMIDATE 2 MG/ML IV SOLN
20.0000 mg | Freq: Once | INTRAVENOUS | Status: AC
  Administered 2011-10-07: 20 mg via INTRAVENOUS

## 2011-10-07 MED ORDER — FENTANYL CITRATE 0.05 MG/ML IJ SOLN
INTRAMUSCULAR | Status: AC
  Administered 2011-10-07: 100 ug via INTRAVENOUS
  Filled 2011-10-07: qty 2

## 2011-10-07 MED ORDER — FENTANYL CITRATE 0.05 MG/ML IJ SOLN
100.0000 ug | Freq: Once | INTRAMUSCULAR | Status: AC
  Administered 2011-10-07: 100 ug via INTRAVENOUS

## 2011-10-07 MED ORDER — MIDAZOLAM BOLUS VIA INFUSION
2.0000 mg | Freq: Once | INTRAVENOUS | Status: AC
  Filled 2011-10-07: qty 2

## 2011-10-07 MED ORDER — METHYLPREDNISOLONE SODIUM SUCC 125 MG IJ SOLR
120.0000 mg | Freq: Once | INTRAMUSCULAR | Status: AC
  Administered 2011-10-07: 120 mg via INTRAVENOUS
  Filled 2011-10-07: qty 2

## 2011-10-07 MED ORDER — BIOTENE DRY MOUTH MT LIQD
15.0000 mL | OROMUCOSAL | Status: DC
  Administered 2011-10-07 – 2011-10-22 (×156): 15 mL via OROMUCOSAL

## 2011-10-07 NOTE — Progress Notes (Signed)
Pt extubated after successful SBT and placed on 4LNC.  Pt started having stridor right away.  Racemic epinephrine given with no relief.  Pt re-intubated with 7.5 ETT, secured @23cm .  ETCo2 + for color change.  BBS equal.  Pt placed on prior vent settings per MD.  Will continue to monitor.

## 2011-10-07 NOTE — Progress Notes (Signed)
Name: Jeremy [name] MRN: [id number] DOB: April 15, 1965    LOS: 6  Referring Provider:  Neuro Reason for Referral:  CVA    PULMONARY / CRITICAL CARE MEDICINE  HPI:  46 yo AAM with PMH of HTN presented to [facility] ED on 8/16 via EMS as a code stroke.  He woke up at 7 am and was unable to ambulate to bathroom, use left hand and was incontinent of urine.  He apparently woke several times during the night to urinate (last at 4am).  He noted right sided headache and vomited in the ambulance.  CT eval demonstrated R basal banglia hemorrhage with minimal surrounding edema.  PCCM consulted for ICU mgmt.     Brief patient description:  46 y/o AAM with HTN admitted with R basal ganglia hemorrhage.   Events Since Admission: 8/16 Admit with basal ganglia hemorrhage 8/20 Intubated for airway protection and elevated ICP with IVC drain placement 8/21 IVC drain replaced d/t prior drain malfunction   Subjective:  Cycling on PSV, needing low dose propofol for agitation when gets WUA.   Vital Signs: Temp:  [98.8 F (37.1 C)-100.9 F (38.3 C)] 99.7 F (37.6 C) (08/22 0800) Pulse Rate:  [79-115] 93  (08/22 0807) Resp:  [16-25] 16  (08/22 0807) BP: (91-154)/(54-88) 96/69 mmHg (08/22 0807) SpO2:  [95 %-100 %] 98 % (08/22 0807) FiO2 (%):  [30 %] 30 % (08/22 0807) Weight:  [121.9 kg (268 lb 11.9 oz)] 121.9 kg (268 lb 11.9 oz) (08/22 0300)  Physical Examination: ICP lower WUJ:WJXBJYN currently on propofol  HEENT: NCAT, PERRL, EOMi,  PULM: CTA B CV: RRR, no mgr, no JVD AB: BS+, soft, nontender, no hsm Ext: warm, no edema, no clubbing, no cyanosis Derm: no rash or skin breakdown Neuro: sedated at present  Principal Problem:  *Malignant hypertension requiring acute intensive management Active Problems:  ICH (intracerebral hemorrhage)  HTN (hypertension)  Hypoxemia  Altered mental status  Cytotoxic cerebral edema  Obstructive hydrocephalus  Acute respiratory failure with hypoxia  Elevated  intracranial pressure   ASSESSMENT AND PLAN  NEUROLOGIC 8/20  CT Head >> R thalamic hematoma slightly enlarged with surrounding edema; 2mm midline shift, no change to sl smaller volume of blood   CNS DRAINS: IVC drain 8/20>>8/21 replaced >>  A:   Acute R Basal Ganglia Hemorrhage -hemorrhage with small amt edema Acute resp failure d/t Intraventric blood and hydroceph improve with IVC  ELEVATED ICP with IVC drain. Increased output with drain P:   Cont IVC drain Per neurosurgery Monitor ICP   PULMONARY  Lab 10/01/11 1750 10/01/11 1102  PHART 7.403 7.345*  PCO2ART 41.3 51.0*  PO2ART 84.1 99.3  HCO3 25.3* 27.1*  O2SAT 96.8 98.5   Ventilator Settings: Vent Mode:  [-] CPAP;PSV FiO2 (%):  [30 %] 30 % Set Rate:  [16 bmp] 16 bmp Vt Set:  [580 mL] 580 mL PEEP:  [5 cmH20] 5 cmH20 Pressure Support:  [5 cmH20-10 cmH20] 5 cmH20 Plateau Pressure:  [22 cmH20-25 cmH20] 23 cmH20 CXR:  CM improved edema ETT:  8/20>>  A:   Acute respiratory failure d/t hydroceph d/t intraventric blood with thalamic bleed  P:   -SBT /WUA daily -poss extubation today   CARDIOVASCULAR  Lab 10/07/11 0500 10/06/11 2130 10/06/11 0907 10/02/11 2206 10/01/11 1013 10/01/11 0742  TROPONINI <0.30 <0.30 <0.30 <0.30 -- <0.30  LATICACIDVEN -- -- -- -- -- --  PROBNP -- -- -- -- 36.5 --   ECG:  None Lines: L IJ CVL 8/20>>> 8/16 TTE >>  limited study, LVEF 60-70%  A: HTN, neg cardiac enzymes, Bp soft on vent with propofol  P:  BP meds on hold with sedation meds dropping BP  RENAL  Lab 10/07/11 0500 10/06/11 2030 10/06/11 0355 10/05/11 0415 10/04/11 0510 10/02/11 0515 10/01/11 1004  NA 137 135 133* 131* 130* -- --  K 3.6 3.4* -- -- -- -- --  CL 97 97 95* 90* 91* -- --  CO2 30 28 29 31 29  -- --  BUN 32* 31* 28* 14 13 -- --  CREATININE 0.95 0.94 0.95 0.81 0.87 -- --  CALCIUM 9.5 9.4 9.2 10.1 10.0 -- --  MG -- -- -- -- -- 2.0 2.1  PHOS -- -- -- -- -- 2.6 1.5*    Intake/Output Summary (Last 24  hours) at 10/07/11 0842 Last data filed at 10/07/11 1610  Gross per 24 hour  Intake 1268.61 ml  Output   2098 ml  Net -829.39 ml    Foley:  8/16>>>  A:   Hyponatremia better with IVC  Was prob d/t cerebral salt wasting with hydroceph Hypokalemia better , diuresed well  P:   Hold lasix today  GASTROINTESTINAL  Lab 10/02/11 2206 10/01/11 1004 10/01/11 0742  AST 24 23 24   ALT 22 23 23   ALKPHOS 96 104 109  BILITOT 0.3 0.2* 0.2*  PROT 8.6* 8.6* 8.5*  ALBUMIN 3.5 3.6 3.5    A:   NPO,  P:   Hold TF since may extubate today 8/22  HEMATOLOGIC  Lab 10/07/11 0500 10/06/11 0355 10/05/11 0415 10/04/11 0510 10/02/11 2206 10/01/11 1004 10/01/11 0742  HGB 12.3* 12.9* 14.7 13.7 12.7* -- --  HCT 36.6* 38.3* 42.3 40.1 38.1* -- --  PLT 256 263 306 325 312 -- --  INR -- -- -- -- -- 1.09 1.04  APTT -- -- -- -- -- 30 31   A:   No acute issues  P:  -monitor cbc -ok on LMWH DVT proph    INFECTIOUS  Lab 10/07/11 0500 10/06/11 0355 10/05/11 0415 10/04/11 0510 10/02/11 2206 10/01/11 0951  WBC 16.0* 12.7* 12.3* 15.7* 15.4* --  PROCALCITON -- -- -- -- -- <0.10   Cultures: 8/16 UC>>neg  Antibiotics: none  A:   No acute infectious process  P:   -wbc trending up but remains afebrile  -cont monitor and hold ABX  ENDOCRINE  Lab 10/07/11 0744 10/07/11 0340 10/06/11 2337 10/06/11 1934 10/06/11 1600  GLUCAP 105* 118* 137* 100* 102*   A:   Hyperglycemia  P:   -SSI CBG for tighter control with CVA    BEST PRACTICE / DISPOSITION Level of Care:  ICU Primary Service:  PCCM Consultants:  Neurology Code Status:  Full Diet:  PO DVT Px:  LMWH GI Px:  ppi Skin Integrity:  intact Social / Family: family updated fully 8/22    Cc 30   [name] Beeper  [phone]  Cell  [phone]  If no response or cell goes to voicemail, call beeper [phone]  10/07/2011  8:42 AM

## 2011-10-07 NOTE — Progress Notes (Signed)
Wide awake and aware on ventilator today. Patient follows commands on the right. Remains densely paretic on the left. Ventriculostomy functioning well after changed out of the tube last night.  No new recommendations at this point. Continue external ventricular drainage.

## 2011-10-07 NOTE — Progress Notes (Signed)
Stroke Team Progress Note  HISTORY [name] is a 46 y.o. male  who had been c/o of headaches for the past 7 days, with nausea and vomiting,  On 10/01/11 morning according to his wife he had visual disturbance and then he vomited followed by left sided weakness approximately 7 AM. Patient arrived to the ED. Head CT was done that showed, Right BG, bleed with some area involved in thalamus. With minimal mass effect.  According to the wife he has stopped taking his BP medications several years ago and does not go the physician as Suggested. Previously, he had significant neurological symptoms with significant elevated BP that could have been attributed to Hypertensive encephalopathy.   SUBJECTIVE Dr. Dutch Quint, Dr. Delford Field and wife at bedside. Nurses just flushed IVC.ventric had clogged off y`day requiring to be replaced and now draining well.   Most recent Vital Signs: Temp: 98.8 F (37.1 C) (08/22 0400) Temp src: Axillary (08/22 0400) BP: 95/61 mmHg (08/22 0700) Pulse Rate: 86  (08/22 0700) Respiratory Rate: 16 O2 Saturdation: 99%  CBG (last 3)   Basename 10/07/11 0340 10/06/11 2337 10/06/11 1934  GLUCAP 118* 137* 100*   Intake/Output from previous day: 08/21 0701 - 08/22 0700 In: 1303.5 [I.V.:883.5; NG/GT:360] Out: 2093 [Urine:1935; Drains:158]  IV Fluid Intake:     . sodium chloride 20 mL/hr (10/06/11 2141)  . fentaNYL infusion INTRAVENOUS 100 mcg/hr (10/07/11 0700)  . propofol 10 mcg/kg/min (10/07/11 0700)  . DISCONTD: feeding supplement (JEVITY 1.2 CAL)     Diet: NPO   Activity: bedrest DVT Prophylaxis:  Lovenox 40 mg sq daily   Studies: Results for orders placed during the hospital encounter of 10/01/11 (from the past 24 hour(s))  GLUCOSE, CAPILLARY     Status: Normal   Collection Time   10/06/11  8:11 AM      Component Value Range   Glucose-Capillary 84  70 - 99 mg/dL  CARDIAC PANEL(CRET KIN+CKTOT+MB+TROPI)     Status: Abnormal   Collection Time   10/06/11  9:07  AM      Component Value Range   Total CK 409 (*) 7 - 232 U/L   CK, MB 4.0  0.3 - 4.0 ng/mL   Troponin I <0.30  <0.30 ng/mL   Relative Index 1.0  0.0 - 2.5  GLUCOSE, CAPILLARY     Status: Normal   Collection Time   10/06/11 12:11 PM      Component Value Range   Glucose-Capillary 87  70 - 99 mg/dL  GLUCOSE, CAPILLARY     Status: Abnormal   Collection Time   10/06/11  4:00 PM      Component Value Range   Glucose-Capillary 102 (*) 70 - 99 mg/dL   Comment 1 Notify Jeremy [name]     Comment 2 Documented in Chart    GLUCOSE, CAPILLARY     Status: Abnormal   Collection Time   10/06/11  7:34 PM      Component Value Range   Glucose-Capillary 100 (*) 70 - 99 mg/dL  BASIC METABOLIC PANEL     Status: Abnormal   Collection Time   10/06/11  8:30 PM      Component Value Range   Sodium 135  135 - 145 mEq/L   Potassium 3.4 (*) 3.5 - 5.1 mEq/L   Chloride 97  96 - 112 mEq/L   CO2 28  19 - 32 mEq/L   Glucose, Bld 133 (*) 70 - 99 mg/dL   BUN 31 (*) 6 -  23 mg/dL   Creatinine, Ser 2.13  0.50 - 1.35 mg/dL   Calcium 9.4  8.4 - 08.6 mg/dL   GFR calc non Af Amer >90  >90 mL/min   GFR calc Af Amer >90  >90 mL/min  CARDIAC PANEL(CRET KIN+CKTOT+MB+TROPI)     Status: Abnormal   Collection Time   10/06/11  9:30 PM      Component Value Range   Total CK 554 (*) 7 - 232 U/L   CK, MB 3.0  0.3 - 4.0 ng/mL   Troponin I <0.30  <0.30 ng/mL   Relative Index 0.5  0.0 - 2.5  GLUCOSE, CAPILLARY     Status: Abnormal   Collection Time   10/06/11 11:37 PM      Component Value Range   Glucose-Capillary 137 (*) 70 - 99 mg/dL  GLUCOSE, CAPILLARY     Status: Abnormal   Collection Time   10/07/11  3:40 AM      Component Value Range   Glucose-Capillary 118 (*) 70 - 99 mg/dL  CARDIAC PANEL(CRET KIN+CKTOT+MB+TROPI)     Status: Abnormal   Collection Time   10/07/11  5:00 AM      Component Value Range   Total CK 491 (*) 7 - 232 U/L   CK, MB 2.0  0.3 - 4.0 ng/mL   Troponin I <0.30  <0.30 ng/mL   Relative Index 0.4  0.0 - 2.5    BASIC METABOLIC PANEL     Status: Abnormal   Collection Time   10/07/11  5:00 AM      Component Value Range   Sodium 137  135 - 145 mEq/L   Potassium 3.6  3.5 - 5.1 mEq/L   Chloride 97  96 - 112 mEq/L   CO2 30  19 - 32 mEq/L   Glucose, Bld 120 (*) 70 - 99 mg/dL   BUN 32 (*) 6 - 23 mg/dL   Creatinine, Ser 5.78  0.50 - 1.35 mg/dL   Calcium 9.5  8.4 - 46.9 mg/dL   GFR calc non Af Amer >90  >90 mL/min   GFR calc Af Amer >90  >90 mL/min  CBC     Status: Abnormal   Collection Time   10/07/11  5:00 AM      Component Value Range   WBC 16.0 (*) 4.0 - 10.5 K/uL   RBC 4.16 (*) 4.22 - 5.81 MIL/uL   Hemoglobin 12.3 (*) 13.0 - 17.0 g/dL   HCT 62.9 (*) 52.8 - 41.3 %   MCV 88.0  78.0 - 100.0 fL   MCH 29.6  26.0 - 34.0 pg   MCHC 33.6  30.0 - 36.0 g/dL   RDW 24.4  01.0 - 27.2 %   Platelets 256  150 - 400 K/uL     Ct Head Wo Contrast  10/06/2011  *RADIOLOGY REPORT*  Clinical Data: Decreased output from ventricular catheter.  CT HEAD WITHOUT CONTRAST  Technique:  Contiguous axial images were obtained from the base of the skull through the vertex without contrast.  Comparison: CT 10/05/2011  Findings: Right frontal ventricular catheter has been placed with the tip in the frontal horn.  Ventricle size is slightly smaller compared with yesterday. Sylvian fissure and subarachnoid space is not visualized compatible with decreased intracranial pressure compared with yesterday.  Right thalamic hemorrhage is unchanged measuring 39 x 17 mm. Ventricular hemorrhage has nearly completely resolved.  No acute infarct or mass.  No midline shift.  IMPRESSION: Right frontal ventricular catheter in good  position.  Slight decrease in ventricle size.  Temporal horns remain mildly dilated. Sylvian fissures are now visualized indicating decrease in intracranial pressure compared with yesterday.  Stable right thalamic hemorrhage.   Original Report Authenticated By: Camelia Phenes, M.D.    Dg Chest Port 1 View  10/06/2011   *RADIOLOGY REPORT*  Clinical Data: Evaluate endotracheal tube placement, atelectasis  PORTABLE CHEST - 1 VIEW  Comparison: Portable chest x-ray of 10/05/2011  Findings: There is no change in position of the endotracheal tube. The left IJ central venous line tip overlies the region of the left innominate vein.  There is mild pulmonary vascular congestion present with cardiomegaly.  An NG tube is present.  IMPRESSION:  1.  Poor aeration with mild pulmonary vascular congestion. 2.  Left central venous line tip overlies left innominate vein.   Original Report Authenticated By: Juline Patch, M.D.    Dg Chest Port 1 View  10/05/2011  *RADIOLOGY REPORT*  Clinical Data:  Central line placement  PORTABLE CHEST - 1 VIEW  Comparison: Prior chest x-rays obtained today, 10/05/2011 at 05:52 a.m., and at 10:52 a.m.  Findings: Interval manipulation of the left IJ approach central venous catheter -the catheter has been pulled back.  The tip now projects over the left brachiocephalic vein.  Endotracheal and nasogastric tube remain unchanged.  Similar appearance of the lungs - there are very low inspiratory volumes with patchy bibasilar opacities.  Enlargement of the cardiac and mediastinal contours are also unchanged.  IMPRESSION:  1.  Interval repositioning of left IJ approach central venous catheter.  The catheter tip projects over the left brachiocephalic vein.  If possible, advancing the catheter over a wire into the superior vena cava would yield a more optimal line placement. Other support apparatus are unchanged.  2. Otherwise, no significant interval change in the appearance of the chest in short interval since the prior examination.   Original Report Authenticated By: Alvino Blood Chest Port 1 View  10/05/2011  *RADIOLOGY REPORT*  Clinical Data: Left IJ line placement.  PORTABLE CHEST - 1 VIEW  Comparison: 10/05/2011 at 5:52 a.m.  Findings: Endotracheal tube tip is 3.1 cm above the carina.  The left IJ line extends  across the midline with tip projecting over the right subclavian vein.  No pneumothorax is observed.  Nasogastric tube coils once in the pharynx or cervical esophagus, but extends to the stomach.  Low lung volumes are present, causing crowding of the pulmonary vasculature.  Mild cardiomegaly noted.  IMPRESSION:  1.  Left IJ line crosses the midline and terminates in the right subclavian vein. 2.  No pneumothorax. 3.  Endotracheal tube appears satisfactorily positioned. Nasogastric tube coils once in the cervical esophagus, but terminates in the stomach. 4.  Low lung volumes.   Original Report Authenticated By: Dellia Cloud, M.D.    Physical Exam:   Pleasant middle-aged [place] American gentleman who appears currently not to be in distress.  Awake alert. Afebrile. Head is nontraumatic. Neck is supple without bruit. Hearing is normal. Cardiac exam no murmur or gallop. Lungs are clear to auscultaion. Neurological Exam : Drowsy but can be easily  aroused and follows commands. .  . There is decrease blink to threat on left . Mild right gaze preference with skew eye deviation with right eye downward and rightward deviated. Vertical gaze paralysis. Pupils 4 mm sluggishly reactive. Mild left lower facial weakness. Tongue is midline. Significant left hemiparesis with 1/5 strength in the left approximately  and 2/5 strength in the left lower extremity but poor effort. Diminished sensation on the left. Left plantar is upgoing. Right is downgoing. Coordination is impaired on the left. Gait was not tested.  ASSESSMENT [name] is a 46 y.o. male with hypertensive right thalamic intracerebral hemorrhage with intraventricular extension with mild hydrocephalus and cytotoxic cerebral edema. Hemorrhage secondary to accelerated hypertension, BP on arrival 164/125.  Neurologically worsening led to IVC placement and intubation. IVC clotted 10/06/2011 and replaced at night. Remains awake and interactive, following  commands. Still on the ventilator  -History of medication noncompliance. -accelerated hypertension  Hospital day # 6  Dr. Pearlean Brownie discussed pt/care with Dr. Delford Field  TREATMENT/PLAN -Continue IVC drain at current settings. Dr. Dutch Quint managing -agree with plans for extubation today -Restart tube feedings following extubation -Restart therapies following extubation and drain removal This patient is critically ill and at significant risk of neurological worsening, death and care requires constant monitoring of vital signs, hemodynamics,respiratory and cardiac monitoring,review of multiple databases, neurological assessment, discussion with family, other specialists and medical decision making of high complexity. I spent 30 minutes of neurocritical care time  in the care of  this patient.   Jeremy Main, MSN, Jeremy [name], Jeremy [name], Jeremy [name], [name] Pager: [phone] 10/07/2011 7:57 AM  Scribe for Dr. Delia Heady, Stroke Center Medical Director, who has personally reviewed chart, pertinent data, examined the patient and developed the plan of care. Pager:  [phone]

## 2011-10-07 NOTE — Procedures (Signed)
Intubation Procedure Note Jeremy [name] [id number] 09-28-1965  Procedure: Intubation Indications: Airway protection and maintenance  Procedure Details Consent: Risks of procedure as well as the alternatives and risks of each were explained to the (patient/caregiver).  Consent for procedure obtained. Time Out: Verified patient identification, verified procedure, site/side was marked, verified correct patient position, special equipment/implants available, medications/allergies/relevent history reviewed, required imaging and test results available.  Performed  4 Glidescope size 4 glidescope  Evaluation Hemodynamic Status: BP stable throughout; O2 sats: stable throughout Patient's Current Condition: stable Complications: No apparent complications Patient did tolerate procedure well. Chest X-ray ordered to verify placement.  CXR: pending.   [name] 10/07/2011

## 2011-10-08 ENCOUNTER — Inpatient Hospital Stay (HOSPITAL_COMMUNITY): Payer: BC Managed Care – PPO

## 2011-10-08 DIAGNOSIS — G911 Obstructive hydrocephalus: Secondary | ICD-10-CM

## 2011-10-08 LAB — GLUCOSE, CAPILLARY
Glucose-Capillary: 101 mg/dL — ABNORMAL HIGH (ref 70–99)
Glucose-Capillary: 105 mg/dL — ABNORMAL HIGH (ref 70–99)
Glucose-Capillary: 105 mg/dL — ABNORMAL HIGH (ref 70–99)
Glucose-Capillary: 108 mg/dL — ABNORMAL HIGH (ref 70–99)
Glucose-Capillary: 139 mg/dL — ABNORMAL HIGH (ref 70–99)
Glucose-Capillary: 152 mg/dL — ABNORMAL HIGH (ref 70–99)
Glucose-Capillary: 98 mg/dL (ref 70–99)

## 2011-10-08 LAB — URINE CULTURE
Colony Count: NO GROWTH
Culture: NO GROWTH

## 2011-10-08 LAB — CBC
HCT: 35.6 % — ABNORMAL LOW (ref 39.0–52.0)
Hemoglobin: 11.7 g/dL — ABNORMAL LOW (ref 13.0–17.0)
MCH: 29.3 pg (ref 26.0–34.0)
MCHC: 32.9 g/dL (ref 30.0–36.0)
MCV: 89.2 fL (ref 78.0–100.0)
Platelets: 302 10*3/uL (ref 150–400)
RBC: 3.99 MIL/uL — ABNORMAL LOW (ref 4.22–5.81)
RDW: 13.4 % (ref 11.5–15.5)
WBC: 21 10*3/uL — ABNORMAL HIGH (ref 4.0–10.5)

## 2011-10-08 LAB — CSF CELL COUNT WITH DIFFERENTIAL
Eosinophils, CSF: 0 % (ref 0–1)
Lymphs, CSF: 0 % — ABNORMAL LOW (ref 40–80)
Monocyte-Macrophage-Spinal Fluid: 0 % — ABNORMAL LOW (ref 15–45)
Other Cells, CSF: 0
RBC Count, CSF: 18050 /mm3 — ABNORMAL HIGH
Segmented Neutrophils-CSF: 100 % — ABNORMAL HIGH (ref 0–6)
WBC, CSF: 390 /mm3 (ref 0–5)

## 2011-10-08 LAB — PATHOLOGIST SMEAR REVIEW

## 2011-10-08 LAB — BASIC METABOLIC PANEL
BUN: 48 mg/dL — ABNORMAL HIGH (ref 6–23)
CO2: 30 mEq/L (ref 19–32)
Calcium: 9.8 mg/dL (ref 8.4–10.5)
Chloride: 96 mEq/L (ref 96–112)
Creatinine, Ser: 0.99 mg/dL (ref 0.50–1.35)
GFR calc Af Amer: >90 mL/min (ref >90)
GFR calc non Af Amer: >90 mL/min (ref >90)
Glucose, Bld: 139 mg/dL — ABNORMAL HIGH (ref 70–99)
Potassium: 3.9 mEq/L (ref 3.5–5.1)
Sodium: 135 mEq/L (ref 135–145)

## 2011-10-08 LAB — GRAM STAIN

## 2011-10-08 LAB — TRIGLYCERIDES: Triglycerides: 168 mg/dL — ABNORMAL HIGH (ref <150)

## 2011-10-08 MED ORDER — VANCOMYCIN HCL 1000 MG IV SOLR
1250.0000 mg | Freq: Two times a day (BID) | INTRAVENOUS | Status: DC
  Administered 2011-10-08 – 2011-10-10 (×5): 1250 mg via INTRAVENOUS
  Filled 2011-10-08 (×6): qty 1250

## 2011-10-08 MED ORDER — PIPERACILLIN-TAZOBACTAM 3.375 G IVPB
3.3750 g | Freq: Three times a day (TID) | INTRAVENOUS | Status: DC
  Administered 2011-10-08 – 2011-10-12 (×12): 3.375 g via INTRAVENOUS
  Filled 2011-10-08 (×14): qty 50

## 2011-10-08 NOTE — Progress Notes (Signed)
Stroke Team Progress Note  HISTORY Jeremy [name] is a 46 y.o. male  who had been c/o of headaches for the past 7 days, with nausea and vomiting,  On 10/01/11 morning according to his wife he had visual disturbance and then he vomited followed by left sided weakness approximately 7 AM. Patient arrived to the ED. Head CT was done that showed, Right BG, bleed with some area involved in thalamus. With minimal mass effect.  According to the wife he has stopped taking his BP medications several years ago and does not go the physician as Suggested. Previously, he had significant neurological symptoms with significant elevated BP that could have been attributed to Hypertensive encephalopathy.   SUBJECTIVE Patient reintubated yesterday after 30 mins of failing extubation.   Most recent Vital Signs: Temp: 97.9 F (36.6 C) (08/23 0725) Temp src: Axillary (08/23 0725) BP: 93/65 mmHg (08/23 0700) Pulse Rate: 69  (08/23 0700) Respiratory Rate: 16 O2 Saturdation: 98%  CBG (last 3)   Basename 10/08/11 0031 10/07/11 2043 10/07/11 1627  GLUCAP 152* 172* 160*   Intake/Output from previous day: 08/22 0701 - 08/23 0700 In: 1678.8 [I.V.:608.8; NG/GT:1070] Out: 1610 [Urine:1375; Drains:235]  IV Fluid Intake:     . sodium chloride 20 mL/hr (10/06/11 2141)  . fentaNYL infusion INTRAVENOUS 50 mcg/hr (10/08/11 0700)  . propofol 10 mcg/kg/min (10/08/11 0700)   Diet: NPO   Activity: bedrest DVT Prophylaxis:  Lovenox 40 mg sq daily   Studies: Results for orders placed during the hospital encounter of 10/01/11 (from the past 24 hour(s))  GLUCOSE, CAPILLARY     Status: Abnormal   Collection Time   10/07/11 12:20 PM      Component Value Range   Glucose-Capillary 130 (*) 70 - 99 mg/dL   Comment 1 Documented in Chart     Comment 2 Notify RN    GLUCOSE, CAPILLARY     Status: Abnormal   Collection Time   10/07/11  4:27 PM      Component Value Range   Glucose-Capillary 160 (*) 70 - 99 mg/dL   Comment  1 Documented in Chart     Comment 2 Notify RN    GLUCOSE, CAPILLARY     Status: Abnormal   Collection Time   10/07/11  8:43 PM      Component Value Range   Glucose-Capillary 172 (*) 70 - 99 mg/dL  GLUCOSE, CAPILLARY     Status: Abnormal   Collection Time   10/08/11 12:31 AM      Component Value Range   Glucose-Capillary 152 (*) 70 - 99 mg/dL   Comment 1 Notify RN     Comment 2 Documented in Chart    BASIC METABOLIC PANEL     Status: Abnormal   Collection Time   10/08/11  3:15 AM      Component Value Range   Sodium 135  135 - 145 mEq/L   Potassium 3.9  3.5 - 5.1 mEq/L   Chloride 96  96 - 112 mEq/L   CO2 30  19 - 32 mEq/L   Glucose, Bld 139 (*) 70 - 99 mg/dL   BUN 48 (*) 6 - 23 mg/dL   Creatinine, Ser 1.61  0.50 - 1.35 mg/dL   Calcium 9.8  8.4 - 09.6 mg/dL   GFR calc non Af Amer >90  >90 mL/min   GFR calc Af Amer >90  >90 mL/min  CBC     Status: Abnormal   Collection Time   10/08/11  3:15 AM      Component Value Range   WBC 21.0 (*) 4.0 - 10.5 K/uL   RBC 3.99 (*) 4.22 - 5.81 MIL/uL   Hemoglobin 11.7 (*) 13.0 - 17.0 g/dL   HCT 40.9 (*) 81.1 - 91.4 %   MCV 89.2  78.0 - 100.0 fL   MCH 29.3  26.0 - 34.0 pg   MCHC 32.9  30.0 - 36.0 g/dL   RDW 78.2  95.6 - 21.3 %   Platelets 302  150 - 400 K/uL  TRIGLYCERIDES     Status: Abnormal   Collection Time   10/08/11  4:15 AM      Component Value Range   Triglycerides 168 (*) <150 mg/dL  GRAM STAIN     Status: Normal   Collection Time   10/08/11  7:42 AM      Component Value Range   Specimen Description CSF     Special Requests 0.5ML     Gram Stain       Value: CYTOSPIN SLIDE     WBC PRESENT, PREDOMINANTLY PMN     NO ORGANISMS SEEN   Report Status 10/08/2011 FINAL       Ct Head Wo Contrast  10/06/2011  *RADIOLOGY REPORT*  Clinical Data: Decreased output from ventricular catheter.  CT HEAD WITHOUT CONTRAST  Technique:  Contiguous axial images were obtained from the base of the skull through the vertex without contrast.   Comparison: CT 10/05/2011  Findings: Right frontal ventricular catheter has been placed with the tip in the frontal horn.  Ventricle size is slightly smaller compared with yesterday. Sylvian fissure and subarachnoid space is not visualized compatible with decreased intracranial pressure compared with yesterday.  Right thalamic hemorrhage is unchanged measuring 39 x 17 mm. Ventricular hemorrhage has nearly completely resolved.  No acute infarct or mass.  No midline shift.  IMPRESSION: Right frontal ventricular catheter in good position.  Slight decrease in ventricle size.  Temporal horns remain mildly dilated. Sylvian fissures are now visualized indicating decrease in intracranial pressure compared with yesterday.  Stable right thalamic hemorrhage.   Original Report Authenticated By: Camelia Phenes, M.D.    Dg Chest Port 1 View  10/08/2011  *RADIOLOGY REPORT*  Clinical Data: Cardiomegaly.  Pulmonary edema.  Hypertension.  PORTABLE CHEST - 1 VIEW  Comparison: 1 day prior  Findings: Endotracheal tube unchanged, with tip 3.7 cm above carina.  Nasogastric extends beyond the  inferior aspect of the film.  Left IJ central line possibly looped at the skin surface, but terminating over the high SVC or central left brachiocephalic vein.  Cardiomegaly accentuated by AP portable technique.  Low lung volumes.  Right costophrenic angle partially excluded.  No definite pleural fluid. No pneumothorax.  Interstitial prominence is felt to be due to low lung volumes.  Improved bibasilar aeration.  IMPRESSION:  1.  Overall improved aeration with decreased atelectasis at the bases. 2.  Interval placement of a nasogastric tube, which extends beyond the  inferior aspect of the film.   Original Report Authenticated By: Consuello Bossier, M.D.    Dg Chest Port 1 View  10/07/2011  *RADIOLOGY REPORT*  Clinical Data:  status post intubation for respiratory failure  PORTABLE CHEST - 1 VIEW  Comparison: 10/07/2011  Findings: ET tube tip is  above the carina.  There is a left IJ catheter with tip in the projection of the innominate vein. Moderate cardiac enlargement.  There is no pleural effusion or edema.  Mild bibasilar atelectasis  is identified.  IMPRESSION:  1.  Mild cardiac enlargement. 2.  Bibasilar atelectasis. 3.  ET tube tip is stable above the carina.   Original Report Authenticated By: Rosealee Albee, M.D.    Dg Chest Port 1 View  10/07/2011  *RADIOLOGY REPORT*  Clinical Data: Check endotracheal tube.  PORTABLE CHEST - 1 VIEW  Comparison: 10/06/2011  Findings: Left jugular central line tip in the left innominate vein region.  There is improved aeration in both lungs which may represent decreased edema.  Heart size is upper limits of normal. Nasogastric tube extends into the abdomen.  IMPRESSION: Improved aeration of both lungs may represent decreased edema.  Support apparatuses as described.   Original Report Authenticated By: Richarda Overlie, M.D.    Physical Exam:   Pleasant middle-aged [place] American gentleman who appears currently not to be in distress.  Awake alert. Afebrile. Head is nontraumatic. Neck is supple without bruit. Hearing is normal. Cardiac exam no murmur or gallop. Lungs are clear to auscultaion. Neurological Exam : Drowsy but can be easily  aroused and follows commands. .  . There is decrease blink to threat on left . Mild right gaze preference with skew eye deviation with right eye downward and rightward deviated. Vertical gaze paralysis. Pupils 4 mm sluggishly reactive. Mild left lower facial weakness. Tongue is midline. Significant left hemiparesis with 1/5 strength in the left approximately and 2/5 strength in the left lower extremity but poor effort. Diminished sensation on the left. Left plantar is upgoing. Right is downgoing. Coordination is impaired on the left. Gait was not tested.  ASSESSMENT Jeremy [name] is a 46 y.o. male with hypertensive right thalamic intracerebral hemorrhage with  intraventricular extension with mild hydrocephalus and cytotoxic cerebral edema. Hemorrhage secondary to accelerated hypertension, BP on arrival 164/125.  Neurologically worsening led to IVC placement and intubation. IVC clotted 10/06/2011 and replaced at night. Remains awake and interactive, following commands. Still on the ventilator  -History of medication noncompliance. -accelerated hypertension -low-grade temperature with leukocytosis. Neurosurgeon took sample of CSF. CSF WBCs were 390. Rest of cell count is pending. Culture pending -dysphagia secondary to stroke -Left hemiparesis  Hospital day # 7  Dr. Pearlean Brownie discussed pt/care with Dr. Delford Field  TREATMENT/PLAN -neurosurgery managing IPC. Follow culture and cell count results -agree with plans for trach. PCCM and will consult ENT for placement. -Restart therapies when able This patient is critically ill and at significant risk of neurological worsening, death and care requires constant monitoring of vital signs, hemodynamics,respiratory and cardiac monitoring,review of multiple databases, neurological assessment, discussion with family, other specialists and medical decision making of high complexity. I spent 30 minutes of neurocritical care time  in the care of  this patient.   Annie Main, MSN, RN, ANVP-BC, ANP-BC, GNP-BC [facility] Pager: [phone] 10/08/2011 8:30 AM  Scribe for Dr. Delia Heady, Stroke Center Medical Director, who has personally reviewed chart, pertinent data, examined the patient and developed the plan of care. Pager:  [phone]

## 2011-10-08 NOTE — Consult Note (Signed)
Reason for Consult:Respiratory failure Referring Physician: CCM  Jeremy [name] is an 46 y.o. male.  HPI: 46 year old male admitted 8/16 with thalamic stroke. Required intubation 8/20 for airway protection thought to be necessary due to stroke-related hydrocephalus.  Failed extubation 8/22 with severe stridor and respiratory distress.  Tracheostomy requested.  Past Medical History  Diagnosis Date  . Hypertension   . Stroke     History reviewed. No pertinent past surgical history.  History reviewed. No pertinent family history.  Social History:  reports that he has never smoked. He does not have any smokeless tobacco history on file. He reports that he does not drink alcohol or use illicit drugs.  Allergies: No Known Allergies  Medications: I have reviewed the patient's current medications.  Results for orders placed during the hospital encounter of 10/01/11 (from the past 48 hour(s))  GLUCOSE, CAPILLARY     Status: Abnormal   Collection Time   10/06/11  7:34 PM      Component Value Range Comment   Glucose-Capillary 100 (*) 70 - 99 mg/dL   BASIC METABOLIC PANEL     Status: Abnormal   Collection Time   10/06/11  8:30 PM      Component Value Range Comment   Sodium 135  135 - 145 mEq/L    Potassium 3.4 (*) 3.5 - 5.1 mEq/L    Chloride 97  96 - 112 mEq/L    CO2 28  19 - 32 mEq/L    Glucose, Bld 133 (*) 70 - 99 mg/dL    BUN 31 (*) 6 - 23 mg/dL    Creatinine, Ser 4.78  0.50 - 1.35 mg/dL    Calcium 9.4  8.4 - 29.5 mg/dL    GFR calc non Af Amer >90  >90 mL/min    GFR calc Af Amer >90  >90 mL/min   CARDIAC PANEL(CRET KIN+CKTOT+MB+TROPI)     Status: Abnormal   Collection Time   10/06/11  9:30 PM      Component Value Range Comment   Total CK 554 (*) 7 - 232 U/L    CK, MB 3.0  0.3 - 4.0 ng/mL    Troponin I <0.30  <0.30 ng/mL    Relative Index 0.5  0.0 - 2.5   GLUCOSE, CAPILLARY     Status: Abnormal   Collection Time   10/06/11 11:37 PM      Component Value Range Comment   Glucose-Capillary 137 (*) 70 - 99 mg/dL   GLUCOSE, CAPILLARY     Status: Abnormal   Collection Time   10/07/11  3:40 AM      Component Value Range Comment   Glucose-Capillary 118 (*) 70 - 99 mg/dL   CARDIAC PANEL(CRET KIN+CKTOT+MB+TROPI)     Status: Abnormal   Collection Time   10/07/11  5:00 AM      Component Value Range Comment   Total CK 491 (*) 7 - 232 U/L    CK, MB 2.0  0.3 - 4.0 ng/mL    Troponin I <0.30  <0.30 ng/mL    Relative Index 0.4  0.0 - 2.5   BASIC METABOLIC PANEL     Status: Abnormal   Collection Time   10/07/11  5:00 AM      Component Value Range Comment   Sodium 137  135 - 145 mEq/L    Potassium 3.6  3.5 - 5.1 mEq/L    Chloride 97  96 - 112 mEq/L    CO2 30  19 -  32 mEq/L    Glucose, Bld 120 (*) 70 - 99 mg/dL    BUN 32 (*) 6 - 23 mg/dL    Creatinine, Ser 8.29  0.50 - 1.35 mg/dL    Calcium 9.5  8.4 - 56.2 mg/dL    GFR calc non Af Amer >90  >90 mL/min    GFR calc Af Amer >90  >90 mL/min   CBC     Status: Abnormal   Collection Time   10/07/11  5:00 AM      Component Value Range Comment   WBC 16.0 (*) 4.0 - 10.5 K/uL    RBC 4.16 (*) 4.22 - 5.81 MIL/uL    Hemoglobin 12.3 (*) 13.0 - 17.0 g/dL    HCT 13.0 (*) 86.5 - 52.0 %    MCV 88.0  78.0 - 100.0 fL    MCH 29.6  26.0 - 34.0 pg    MCHC 33.6  30.0 - 36.0 g/dL    RDW 78.4  69.6 - 29.5 %    Platelets 256  150 - 400 K/uL   GLUCOSE, CAPILLARY     Status: Abnormal   Collection Time   10/07/11  7:44 AM      Component Value Range Comment   Glucose-Capillary 105 (*) 70 - 99 mg/dL   GLUCOSE, CAPILLARY     Status: Abnormal   Collection Time   10/07/11 12:20 PM      Component Value Range Comment   Glucose-Capillary 130 (*) 70 - 99 mg/dL    Comment 1 Documented in Chart      Comment 2 Notify RN     GLUCOSE, CAPILLARY     Status: Abnormal   Collection Time   10/07/11  4:27 PM      Component Value Range Comment   Glucose-Capillary 160 (*) 70 - 99 mg/dL    Comment 1 Documented in Chart      Comment 2 Notify RN       GLUCOSE, CAPILLARY     Status: Abnormal   Collection Time   10/07/11  8:43 PM      Component Value Range Comment   Glucose-Capillary 172 (*) 70 - 99 mg/dL   GLUCOSE, CAPILLARY     Status: Abnormal   Collection Time   10/08/11 12:31 AM      Component Value Range Comment   Glucose-Capillary 152 (*) 70 - 99 mg/dL    Comment 1 Notify RN      Comment 2 Documented in Chart     BASIC METABOLIC PANEL     Status: Abnormal   Collection Time   10/08/11  3:15 AM      Component Value Range Comment   Sodium 135  135 - 145 mEq/L    Potassium 3.9  3.5 - 5.1 mEq/L    Chloride 96  96 - 112 mEq/L    CO2 30  19 - 32 mEq/L    Glucose, Bld 139 (*) 70 - 99 mg/dL    BUN 48 (*) 6 - 23 mg/dL    Creatinine, Ser 2.84  0.50 - 1.35 mg/dL    Calcium 9.8  8.4 - 13.2 mg/dL    GFR calc non Af Amer >90  >90 mL/min    GFR calc Af Amer >90  >90 mL/min   CBC     Status: Abnormal   Collection Time   10/08/11  3:15 AM      Component Value Range Comment   WBC 21.0 (*) 4.0 -  10.5 K/uL    RBC 3.99 (*) 4.22 - 5.81 MIL/uL    Hemoglobin 11.7 (*) 13.0 - 17.0 g/dL    HCT 16.1 (*) 09.6 - 52.0 %    MCV 89.2  78.0 - 100.0 fL    MCH 29.3  26.0 - 34.0 pg    MCHC 32.9  30.0 - 36.0 g/dL    RDW 04.5  40.9 - 81.1 %    Platelets 302  150 - 400 K/uL   TRIGLYCERIDES     Status: Abnormal   Collection Time   10/08/11  4:15 AM      Component Value Range Comment   Triglycerides 168 (*) <150 mg/dL   GRAM STAIN     Status: Normal   Collection Time   10/08/11  7:42 AM      Component Value Range Comment   Specimen Description CSF      Special Requests 0.5ML      Gram Stain        Value: CYTOSPIN SLIDE     WBC PRESENT, PREDOMINANTLY PMN     NO ORGANISMS SEEN   Report Status 10/08/2011 FINAL     CSF CELL COUNT WITH DIFFERENTIAL     Status: Abnormal   Collection Time   10/08/11  7:42 AM      Component Value Range Comment   Tube # SPECIMEN SENT IN CUP      Color, CSF PINK (*) COLORLESS    Appearance, CSF CLOUDY (*) CLEAR     Supernatant XANTHOCHROMIC      RBC Count, CSF 18050 (*) 0 /cu mm    WBC, CSF 390 (*) 0 - 5 /cu mm    Segmented Neutrophils-CSF 100 (*) 0 - 6 %    Lymphs, CSF 0 (*) 40 - 80 %    Monocyte-Macrophage-Spinal Fluid 0 (*) 15 - 45 %    Eosinophils, CSF 0  0 - 1 %    Other Cells, CSF 0     PATHOLOGIST SMEAR REVIEW     Status: Normal   Collection Time   10/08/11  7:42 AM      Component Value Range Comment   Path Review PREDOMINANTLY RBC's. WBC's WHICH MAYBE     GLUCOSE, CAPILLARY     Status: Abnormal   Collection Time   10/08/11  8:16 AM      Component Value Range Comment   Glucose-Capillary 101 (*) 70 - 99 mg/dL   GLUCOSE, CAPILLARY     Status: Abnormal   Collection Time   10/08/11 12:01 PM      Component Value Range Comment   Glucose-Capillary 105 (*) 70 - 99 mg/dL   GLUCOSE, CAPILLARY     Status: Normal   Collection Time   10/08/11  3:50 PM      Component Value Range Comment   Glucose-Capillary 98  70 - 99 mg/dL    Comment 1 Notify RN      Comment 2 Documented in Chart       Ct Head Wo Contrast  10/06/2011  *RADIOLOGY REPORT*  Clinical Data: Decreased output from ventricular catheter.  CT HEAD WITHOUT CONTRAST  Technique:  Contiguous axial images were obtained from the base of the skull through the vertex without contrast.  Comparison: CT 10/05/2011  Findings: Right frontal ventricular catheter has been placed with the tip in the frontal horn.  Ventricle size is slightly smaller compared with yesterday. Sylvian fissure and subarachnoid space is not visualized compatible with decreased intracranial  pressure compared with yesterday.  Right thalamic hemorrhage is unchanged measuring 39 x 17 mm. Ventricular hemorrhage has nearly completely resolved.  No acute infarct or mass.  No midline shift.  IMPRESSION: Right frontal ventricular catheter in good position.  Slight decrease in ventricle size.  Temporal horns remain mildly dilated. Sylvian fissures are now visualized indicating decrease in  intracranial pressure compared with yesterday.  Stable right thalamic hemorrhage.   Original Report Authenticated By: Camelia Phenes, M.D.    Dg Chest Port 1 View  10/08/2011  *RADIOLOGY REPORT*  Clinical Data: Cardiomegaly.  Pulmonary edema.  Hypertension.  PORTABLE CHEST - 1 VIEW  Comparison: 1 day prior  Findings: Endotracheal tube unchanged, with tip 3.7 cm above carina.  Nasogastric extends beyond the  inferior aspect of the film.  Left IJ central line possibly looped at the skin surface, but terminating over the high SVC or central left brachiocephalic vein.  Cardiomegaly accentuated by AP portable technique.  Low lung volumes.  Right costophrenic angle partially excluded.  No definite pleural fluid. No pneumothorax.  Interstitial prominence is felt to be due to low lung volumes.  Improved bibasilar aeration.  IMPRESSION:  1.  Overall improved aeration with decreased atelectasis at the bases. 2.  Interval placement of a nasogastric tube, which extends beyond the  inferior aspect of the film.   Original Report Authenticated By: Consuello Bossier, M.D.    Dg Chest Port 1 View  10/07/2011  *RADIOLOGY REPORT*  Clinical Data:  status post intubation for respiratory failure  PORTABLE CHEST - 1 VIEW  Comparison: 10/07/2011  Findings: ET tube tip is above the carina.  There is a left IJ catheter with tip in the projection of the innominate vein. Moderate cardiac enlargement.  There is no pleural effusion or edema.  Mild bibasilar atelectasis is identified.  IMPRESSION:  1.  Mild cardiac enlargement. 2.  Bibasilar atelectasis. 3.  ET tube tip is stable above the carina.   Original Report Authenticated By: Rosealee Albee, M.D.    Dg Chest Port 1 View  10/07/2011  *RADIOLOGY REPORT*  Clinical Data: Check endotracheal tube.  PORTABLE CHEST - 1 VIEW  Comparison: 10/06/2011  Findings: Left jugular central line tip in the left innominate vein region.  There is improved aeration in both lungs which may represent  decreased edema.  Heart size is upper limits of normal. Nasogastric tube extends into the abdomen.  IMPRESSION: Improved aeration of both lungs may represent decreased edema.  Support apparatuses as described.   Original Report Authenticated By: Richarda Overlie, M.D.     Review of Systems  Unable to perform ROS: intubated   Blood pressure 128/78, pulse 68, temperature 98.2 F (36.8 C), temperature source Axillary, resp. rate 16, height 5\' 10"  (1.778 m), weight 120.8 kg (266 lb 5.1 oz), SpO2 98.00%. Physical Exam  Constitutional: He appears well-developed and well-nourished. No distress.       Orally intubated.  HENT:  Head: Normocephalic and atraumatic.  Right Ear: External ear normal.  Left Ear: External ear normal.  Nose: Nose normal.  Mouth/Throat: Oropharynx is clear and moist.  Eyes: Conjunctivae and EOM are normal. Pupils are equal, round, and reactive to light.  Neck: Normal range of motion. Neck supple.       No scars on neck.  Cardiovascular: Normal rate.   Respiratory: Effort normal.  GI:       Did not examine.  Genitourinary:       Did not examine.  Musculoskeletal:  Did not examine.  Neurological: He is alert.       Difficult to assess cranial nerves.  Skin: Skin is warm and dry.  Psychiatric:       Could not assess.    Assessment/Plan: Respiratory failure, possible OSA.  New CVA. Scheduled for tracheostomy in OR 8/26 at 12 pm.  Please hold TFs after midnight night before.  Jeremy [name] 10/08/2011, 5:38 PM

## 2011-10-08 NOTE — Progress Notes (Signed)
Subjective: Patient reports intubated Objective: Vital signs in last 24 hours: Temp:  [98.3 F (36.8 C)-99.7 F (37.6 C)] 98.3 F (36.8 C) (08/23 0400) Pulse Rate:  [68-109] 69  (08/23 0700) Resp:  [16-31] 16  (08/23 0700) BP: (93-146)/(58-94) 93/65 mmHg (08/23 0700) SpO2:  [94 %-98 %] 98 % (08/23 0700) FiO2 (%):  [30 %] 30 % (08/23 0725) Weight:  [120.8 kg (266 lb 5.1 oz)] 120.8 kg (266 lb 5.1 oz) (08/23 0438)  Intake/Output from previous day: 08/22 0701 - 08/23 0700 In: 1678.8 [I.V.:608.8; NG/GT:1070] Out: 1610 [Urine:1375; Drains:235] Intake/Output this shift:    Sedated but awakens and follows commands in the right  Lab Results:  Basename 10/08/11 0315 10/07/11 0500  WBC 21.0* 16.0*  HGB 11.7* 12.3*  HCT 35.6* 36.6*  PLT 302 256   BMET  Basename 10/08/11 0315 10/07/11 0500  NA 135 137  K 3.9 3.6  CL 96 97  CO2 30 30  GLUCOSE 139* 120*  BUN 48* 32*  CREATININE 0.99 0.95  CALCIUM 9.8 9.5    Studies/Results: Ct Head Wo Contrast  10/06/2011  *RADIOLOGY REPORT*  Clinical Data: Decreased output from ventricular catheter.  CT HEAD WITHOUT CONTRAST  Technique:  Contiguous axial images were obtained from the base of the skull through the vertex without contrast.  Comparison: CT 10/05/2011  Findings: Right frontal ventricular catheter has been placed with the tip in the frontal horn.  Ventricle size is slightly smaller compared with yesterday. Sylvian fissure and subarachnoid space is not visualized compatible with decreased intracranial pressure compared with yesterday.  Right thalamic hemorrhage is unchanged measuring 39 x 17 mm. Ventricular hemorrhage has nearly completely resolved.  No acute infarct or mass.  No midline shift.  IMPRESSION: Right frontal ventricular catheter in good position.  Slight decrease in ventricle size.  Temporal horns remain mildly dilated. Sylvian fissures are now visualized indicating decrease in intracranial pressure compared with yesterday.   Stable right thalamic hemorrhage.   Original Report Authenticated By: Camelia Phenes, M.D.    Dg Chest Port 1 View  10/07/2011  *RADIOLOGY REPORT*  Clinical Data:  status post intubation for respiratory failure  PORTABLE CHEST - 1 VIEW  Comparison: 10/07/2011  Findings: ET tube tip is above the carina.  There is a left IJ catheter with tip in the projection of the innominate vein. Moderate cardiac enlargement.  There is no pleural effusion or edema.  Mild bibasilar atelectasis is identified.  IMPRESSION:  1.  Mild cardiac enlargement. 2.  Bibasilar atelectasis. 3.  ET tube tip is stable above the carina.   Original Report Authenticated By: Rosealee Albee, M.D.    Dg Chest Port 1 View  10/07/2011  *RADIOLOGY REPORT*  Clinical Data: Check endotracheal tube.  PORTABLE CHEST - 1 VIEW  Comparison: 10/06/2011  Findings: Left jugular central line tip in the left innominate vein region.  There is improved aeration in both lungs which may represent decreased edema.  Heart size is upper limits of normal. Nasogastric tube extends into the abdomen.  IMPRESSION: Improved aeration of both lungs may represent decreased edema.  Support apparatuses as described.   Original Report Authenticated By: Richarda Overlie, M.D.     Assessment/Plan: Neurologically patient seems to be improved status post ventricular catheter placement. Failed extubation yesterday readmitted seems to be running low-grade temperatures with a bump in his white count moribund occult infectious source and discuss his critical-care in addition we'll send surveillance CSF cultures and we'll need to consider initiation  of antibiotic treatment prophylaxis after CSF surveillance had been sent.  LOS: 7 days     Earnie Bechard P 10/08/2011, 7:30 AM

## 2011-10-08 NOTE — Progress Notes (Signed)
ANTIBIOTIC CONSULT NOTE - INITIAL  Pharmacy Consult for Vanco/Zosyn Indication: r/o Meningitis  No Known Allergies  Patient Measurements: Height: 5\' 10"  (177.8 cm) Weight: 266 lb 5.1 oz (120.8 kg) IBW/kg (Calculated) : 73  Adjusted Body Weight: 87.4 kg  Vital Signs: Temp: 97.9 F (36.6 C) (08/23 0725) Temp src: Axillary (08/23 0725) BP: 105/62 mmHg (08/23 0900) Pulse Rate: 75  (08/23 0900) Intake/Output from previous day: 08/22 0701 - 08/23 0700 In: 1708.8 [I.V.:608.8; NG/GT:1100] Out: 1610 [Urine:1375; Drains:235] Intake/Output from this shift: Total I/O In: 255.2 [I.V.:65.2; NG/GT:190] Out: 339 [Urine:320; Drains:19]  Labs:  [facility] 10/08/11 0315 10/07/11 0500 10/06/11 2030 10/06/11 0355  WBC 21.0* 16.0* -- 12.7*  HGB 11.7* 12.3* -- 12.9*  PLT 302 256 -- 263  LABCREA -- -- -- --  CREATININE 0.99 0.95 0.94 --   Estimated Creatinine Clearance: 122.7 ml/min (by C-G formula based on Cr of 0.99). No results found for this basename: VANCOTROUGH:2,VANCOPEAK:2,VANCORANDOM:2,GENTTROUGH:2,GENTPEAK:2,GENTRANDOM:2,TOBRATROUGH:2,TOBRAPEAK:2,TOBRARND:2,AMIKACINPEAK:2,AMIKACINTROU:2,AMIKACIN:2, in the last 72 hours   Microbiology: Recent Results (from the past 720 hour(s))  URINE CULTURE     Status: Normal   Collection Time   10/01/11  8:52 AM      Component Value Range Status Comment   Specimen Description URINE, CATHETERIZED   Final    Special Requests NONE   Final    Culture  Setup Time 10/01/2011 14:57   Final    Colony Count NO GROWTH   Final    Culture NO GROWTH   Final    Report Status 10/02/2011 FINAL   Final   MRSA PCR SCREENING     Status: Normal   Collection Time   10/01/11 10:11 AM      Component Value Range Status Comment   MRSA by PCR NEGATIVE  NEGATIVE Final   GRAM STAIN     Status: Normal   Collection Time   10/08/11  7:42 AM      Component Value Range Status Comment   Specimen Description CSF   Final    Special Requests 0.5ML   Final    Gram Stain      Final    Value: CYTOSPIN SLIDE     WBC PRESENT, PREDOMINANTLY PMN     NO ORGANISMS SEEN   Report Status 10/08/2011 FINAL   Final     Medical History: Past Medical History  Diagnosis Date  . Hypertension   . Stroke    Assessment: basal banglia hemorrhage   ID: Tmax 99.7. WBC up to 21. Infection in CSF vs pulmonary source. CSF with WBC.  Vanco 8/23>> Zosyn 8/23>>  8/16 UCx: Negative 8/23: CSF>> pending 8/23: BC x 2>> 8/23 UCx>> 8/23 Respir>>  Goal of Therapy:  Vancomycin trough level 15-20 mcg/ml  Plan:  Zosyn 3.375g IV q8h. Vanco 1250mg  IV q12hrs. Trough after 3-5 doses at steady state.  Merilynn Finland, [employer] 10/08/2011,9:59 AM

## 2011-10-08 NOTE — Progress Notes (Signed)
Name: Jeremy [name] MRN: [id number] DOB: 08/13/1965    LOS: 7  Referring Provider:  Neuro Reason for Referral:  CVA    PULMONARY / CRITICAL CARE MEDICINE  HPI:  46 yo AAM with PMH of HTN presented to [facility] ED on 8/16 via EMS as a code stroke.  He woke up at 7 am and was unable to ambulate to bathroom, use left hand and was incontinent of urine.  He apparently woke several times during the night to urinate (last at 4am).  He noted right sided headache and vomited in the ambulance.  CT eval demonstrated R basal banglia hemorrhage with minimal surrounding edema.  PCCM consulted for ICU mgmt.     Brief patient description:  46 y/o AAM with HTN admitted with R basal ganglia hemorrhage.   Events Since Admission: 8/16 Admit with basal ganglia hemorrhage 8/20 Intubated for airway protection and elevated ICP with IVC drain placement 8/21 IVC drain replaced d/t prior drain malfunction   Subjective:  Failed extubation 8/22.  Will need trach. Pt had severe insp stridor after extubation. Has fever and ? CSF infection.    Vital Signs: Temp:  [97.9 F (36.6 C)-99.3 F (37.4 C)] 97.9 F (36.6 C) (08/23 0725) Pulse Rate:  [68-104] 75  (08/23 0900) Resp:  [16-20] 16  (08/23 0900) BP: (93-146)/(58-94) 105/62 mmHg (08/23 0900) SpO2:  [94 %-98 %] 97 % (08/23 0900) FiO2 (%):  [30 %] 30 % (08/23 0800) Weight:  [120.8 kg (266 lb 5.1 oz)] 120.8 kg (266 lb 5.1 oz) (08/23 0438)  Physical Examination: ICP lower FAO:ZHYQMVH currently on propofol  HEENT: NCAT, PERRL, EOMi,  PULM: scattered rhonchi CV: RRR, no mgr, no JVD AB: BS+, soft, nontender, no hsm Ext: warm, no edema, no clubbing, no cyanosis Derm: no rash or skin breakdown Neuro: sedated at present  Principal Problem:  *Malignant hypertension requiring acute intensive management Active Problems:  ICH (intracerebral hemorrhage)  HTN (hypertension)  Hypoxemia  Altered mental status  Cytotoxic cerebral edema  Obstructive hydrocephalus  Acute respiratory failure with hypoxia  Elevated intracranial pressure   ASSESSMENT AND PLAN  NEUROLOGIC CPP 63  ICP 9 8/23 8/20  CT Head >> R thalamic hematoma slightly enlarged with surrounding edema; 2mm midline shift, no change to sl smaller volume of blood   CNS DRAINS: IVC drain 8/20>>8/21 replaced >>  A:   Acute R Basal Ganglia Hemorrhage -hemorrhage with small amt edema Acute resp failure d/t Intraventric blood and hydroceph improve with IVC  ELEVATED ICP with IVC drain. Increased output with drain P:   Cont IVC drain Per neurosurgery Monitor ICP   PULMONARY  Lab 10/01/11 1750 10/01/11 1102  PHART 7.403 7.345*  PCO2ART 41.3 51.0*  PO2ART 84.1 99.3  HCO3 25.3* 27.1*  O2SAT 96.8 98.5   Ventilator Settings: Vent Mode:  [-] PRVC FiO2 (%):  [30 %] 30 % Set Rate:  [16 bmp-18 bmp] 16 bmp Vt Set:  [580 mL] 580 mL PEEP:  [5 cmH20] 5 cmH20 Plateau Pressure:  [22 cmH20-25 cmH20] 22 cmH20 CXR:  CM improved edema ETT:  8/20>>8/22>>reintubated 8/22>>  A:   Acute respiratory failure d/t hydroceph d/t intraventric blood with thalamic bleed  P:   -SBT /WUA daily -pt will need trach next week, ENT called   CARDIOVASCULAR  Lab 10/07/11 0500 10/06/11 2130 10/06/11 0907 10/02/11 2206 10/01/11 1013  TROPONINI <0.30 <0.30 <0.30 <0.30 --  LATICACIDVEN -- -- -- -- --  PROBNP -- -- -- -- 36.5   ECG:  None Lines: L IJ CVL 8/20>>> 8/16 TTE >> limited study, LVEF 60-70%  A: HTN, neg cardiac enzymes, Bp soft on vent with propofol  P:  Hold BP meds for now   RENAL  Lab 10/08/11 0315 10/07/11 0500 10/06/11 2030 10/06/11 0355 10/05/11 0415 10/02/11 0515 10/01/11 1004  NA 135 137 135 133* 131* -- --  K 3.9 3.6 -- -- -- -- --  CL 96 97 97 95* 90* -- --  CO2 30 30 28 29 31  -- --  BUN 48* 32* 31* 28* 14 -- --  CREATININE 0.99 0.95 0.94 0.95 0.81 -- --  CALCIUM 9.8 9.5 9.4 9.2 10.1 -- --  MG -- -- -- -- -- 2.0 2.1  PHOS -- -- -- -- -- 2.6 1.5*    Intake/Output  Summary (Last 24 hours) at 10/08/11 1610 Last data filed at 10/08/11 0800  Gross per 24 hour  Intake 1772.93 ml  Output   1590 ml  Net 182.93 ml    Foley:  8/16>>>out 8/21>>8/23>>>  A:   Hyponatremia better with IVC  Was prob d/t cerebral salt wasting with hydroceph Hypokalemia better   P:   No lasix  GASTROINTESTINAL  Lab 10/02/11 2206 10/01/11 1004  AST 24 23  ALT 22 23  ALKPHOS 96 104  BILITOT 0.3 0.2*  PROT 8.6* 8.6*  ALBUMIN 3.5 3.6    A:   No active GI issues  P:   Cont TF   HEMATOLOGIC  Lab 10/08/11 0315 10/07/11 0500 10/06/11 0355 10/05/11 0415 10/04/11 0510 10/01/11 1004  HGB 11.7* 12.3* 12.9* 14.7 13.7 --  HCT 35.6* 36.6* 38.3* 42.3 40.1 --  PLT 302 256 263 306 325 --  INR -- -- -- -- -- 1.09  APTT -- -- -- -- -- 30   A:   No acute issues  P:  -monitor cbc -ok on LMWH DVT proph    INFECTIOUS  Lab 10/08/11 0315 10/07/11 0500 10/06/11 0355 10/05/11 0415 10/04/11 0510 10/01/11 0951  WBC 21.0* 16.0* 12.7* 12.3* 15.7* --  PROCALCITON -- -- -- -- -- <0.10   Cultures: 8/16 UC>>neg 8/23 CSF>>> 8/23 BCx2>>> 8/23 Resp c/s>>>  Antibiotics: Zosyn 8/23 (?csf infx)>> vanco 8/23 (? Csf )>> A:   Fever, ? CSF vs pulm source P:   -start zosyn/vanco -BC, UC, CSF, resp c/s>>f/u    ENDOCRINE  Lab 10/08/11 0816 10/08/11 0031 10/07/11 2043 10/07/11 1627 10/07/11 1220  GLUCAP 101* 152* 172* 160* 130*   A:   Hyperglycemia  P:   -SSI CBG for tighter control with CVA    BEST PRACTICE / DISPOSITION Level of Care:  ICU Primary Service:  PCCM Consultants:  Neurology, ENT called for trach 8/23 Code Status:  Full Diet:  TF DVT Px:  LMWH GI Px:  ppi Skin Integrity:  intact Social / Family: family updated fully 8/23   Cc 35   [name] Beeper  [phone]  Cell  [phone]  If no response or cell goes to voicemail, call beeper [phone]  10/08/2011  9:22 AM

## 2011-10-08 NOTE — Progress Notes (Signed)
Nutrition Follow-up  Intervention:   Continue current TF regimen  Assessment:   Patient is currently intubated on ventilator support due to hypertensive right thalamic intracerebral hemorrhage with intraventricular extension with mild hydrocephalus and cytotoxic cerebral edema. Pt discussed in ICU rounds this am. Per MD will likely need a trach next week.   MV: 9.6 Temp:Temp (24hrs), Avg:98.5 F (36.9 C), Min:97.9 F (36.6 C), Max:99.3 F (37.4 C) Propofol: 7.3 ml/hr, providing: 192 kcal/day  Patient has OGT in place. Promote is infusing @ 30 ml/hr. 60 ml Prostat via tube QID. Tube feeding regimen currently providing 1520 kcal, 165 grams protein, and 604 ml H2O.   TF regimen and propofol providing 1712 kcal (73% of needs) and 165 grams protein (>100% of needs).   Residuals: 10 ml  Last bm: Not documented  Diet Order:  NPO  Meds: Scheduled Meds:   . albuterol-ipratropium  6 puff Inhalation Q6H  . antiseptic oral rinse  15 mL Mouth Rinse Q2H  . atorvastatin  20 mg Per Tube q1800  . chlorhexidine  15 mL Mouth Rinse BID  . enoxaparin (LOVENOX) injection  40 mg Subcutaneous Q24H  . feeding supplement  60 mL Per Tube QID  . feeding supplement (PROMOTE)  1,000 mL Per Tube Q24H  . insulin aspart  0-15 Units Subcutaneous Q4H  . multivitamin  5 mL Per Tube Daily  . pantoprazole sodium  40 mg Per Tube Q1200  . piperacillin-tazobactam (ZOSYN)  IV  3.375 g Intravenous Q8H  . senna-docusate  1 tablet Oral BID  . vancomycin  1,250 mg Intravenous Q12H   Continuous Infusions:   . sodium chloride 20 mL/hr (10/06/11 2141)  . fentaNYL infusion INTRAVENOUS 75 mcg/hr (10/08/11 1218)  . propofol 10.408 mcg/kg/min (10/08/11 1200)   PRN Meds:.acetaminophen, fentaNYL, hydrALAZINE, ondansetron  Labs:  CMP     Component Value Date/Time   NA 135 10/08/2011 0315   K 3.9 10/08/2011 0315   CL 96 10/08/2011 0315   CO2 30 10/08/2011 0315   GLUCOSE 139* 10/08/2011 0315   BUN 48* 10/08/2011 0315   CREATININE 0.99 10/08/2011 0315   CALCIUM 9.8 10/08/2011 0315   PROT 8.6* 10/02/2011 2206   ALBUMIN 3.5 10/02/2011 2206   AST 24 10/02/2011 2206   ALT 22 10/02/2011 2206   ALKPHOS 96 10/02/2011 2206   BILITOT 0.3 10/02/2011 2206   GFRNONAA >90 10/08/2011 0315   GFRAA >90 10/08/2011 0315   CBG (last 3)   Basename 10/08/11 1201 10/08/11 0816 10/08/11 0031  GLUCAP 105* 101* 152*   Sodium  Date/Time Value Range Status  10/08/2011  3:15 AM 135  135 - 145 mEq/L Final  10/07/2011  5:00 AM 137  135 - 145 mEq/L Final  10/06/2011  8:30 PM 135  135 - 145 mEq/L Final    Potassium  Date/Time Value Range Status  10/08/2011  3:15 AM 3.9  3.5 - 5.1 mEq/L Final  10/07/2011  5:00 AM 3.6  3.5 - 5.1 mEq/L Final  10/06/2011  8:30 PM 3.4* 3.5 - 5.1 mEq/L Final    Phosphorus  Date/Time Value Range Status  10/02/2011  5:15 AM 2.6  2.3 - 4.6 mg/dL Final  1/61/0960 45:40 AM 1.5* 2.3 - 4.6 mg/dL Final    Magnesium  Date/Time Value Range Status  10/02/2011  5:15 AM 2.0  1.5 - 2.5 mg/dL Final  9/81/1914 78:29 AM 2.1  1.5 - 2.5 mg/dL Final    Intake/Output Summary (Last 24 hours) at 10/08/11 1239 Last data filed at  10/08/11 1215  Gross per 24 hour  Intake 2267.15 ml  Output   1760 ml  Net 507.15 ml    Weight Status:  272 lbs on admission with no recent wt changes PTA per wife.  266 lbs 8/23 268 lbs 8/22 272 lbs 8/21  Re-estimated needs:  2337 kcal; >/= 150 grams protein  Nutrition Dx:  Inadequate oral intake r/t inability to eat AEB NPO status; ongoing.  Goal:  Enteral nutrition to provide 60-70% of estimated calorie needs (22-25 kcals/kg ideal body weight) and >/= 90% of estimated protein needs, based on ASPEN guidelines for permissive underfeeding in critically ill obese individuals; met.   Monitor:  Vent status, weight, TF tolerance, labs   Kendell Bane RD, LDN, CNSC [phone] Pager [phone] After Hours Pager

## 2011-10-09 ENCOUNTER — Inpatient Hospital Stay (HOSPITAL_COMMUNITY): Payer: BC Managed Care – PPO

## 2011-10-09 LAB — BASIC METABOLIC PANEL
BUN: 43 mg/dL — ABNORMAL HIGH (ref 6–23)
CO2: 31 mEq/L (ref 19–32)
Calcium: 9.6 mg/dL (ref 8.4–10.5)
Chloride: 101 mEq/L (ref 96–112)
Creatinine, Ser: 0.89 mg/dL (ref 0.50–1.35)
GFR calc Af Amer: >90 mL/min (ref >90)
GFR calc non Af Amer: >90 mL/min (ref >90)
Glucose, Bld: 113 mg/dL — ABNORMAL HIGH (ref 70–99)
Potassium: 3.3 mEq/L — ABNORMAL LOW (ref 3.5–5.1)
Sodium: 140 mEq/L (ref 135–145)

## 2011-10-09 LAB — GLUCOSE, CAPILLARY
Glucose-Capillary: 86 mg/dL (ref 70–99)
Glucose-Capillary: 86 mg/dL (ref 70–99)
Glucose-Capillary: 94 mg/dL (ref 70–99)
Glucose-Capillary: 70 mg/dL (ref 70–99)

## 2011-10-09 LAB — CBC
HCT: 34.3 % — ABNORMAL LOW (ref 39.0–52.0)
Hemoglobin: 11.3 g/dL — ABNORMAL LOW (ref 13.0–17.0)
MCH: 29.7 pg (ref 26.0–34.0)
MCHC: 32.9 g/dL (ref 30.0–36.0)
MCV: 90 fL (ref 78.0–100.0)
Platelets: 308 10*3/uL (ref 150–400)
RBC: 3.81 MIL/uL — ABNORMAL LOW (ref 4.22–5.81)
RDW: 13.7 % (ref 11.5–15.5)
WBC: 17.6 10*3/uL — ABNORMAL HIGH (ref 4.0–10.5)

## 2011-10-09 MED ORDER — POTASSIUM CHLORIDE 20 MEQ/15ML (10%) PO LIQD
ORAL | Status: AC
  Administered 2011-10-09: 05:00:00
  Filled 2011-10-09: qty 30

## 2011-10-09 MED ORDER — POTASSIUM CHLORIDE 20 MEQ/15ML (10%) PO LIQD
40.0000 meq | Freq: Once | ORAL | Status: AC
  Administered 2011-10-09: 40 meq
  Filled 2011-10-09: qty 30

## 2011-10-09 NOTE — Progress Notes (Signed)
Name: Jeremy [name] MRN: [id number] DOB: [birth year]    LOS: 8  Referring Provider:  Neuro Reason for Referral:  CVA    PULMONARY / CRITICAL CARE MEDICINE  HPI:  46 yo AAM with PMH of HTN presented to [facility] ED on 8/16 via EMS as a code stroke.  He woke up at 7 am and was unable to ambulate to bathroom, use left hand and was incontinent of urine.  He apparently woke several times during the night to urinate (last at 4am).  He noted right sided headache and vomited in the ambulance.  CT eval demonstrated R basal banglia hemorrhage with minimal surrounding edema.  PCCM consulted for ICU mgmt.     Brief patient description:  46 y/o AAM with HTN admitted with R basal ganglia hemorrhage.   Events Since Admission: 8/16 Admit with basal ganglia hemorrhage 8/20 Intubated for airway protection and elevated ICP with IVC drain placement 8/21 IVC drain replaced d/t prior drain malfunction   Subjective:  Stable on vent overnight.  Low grade temps noted.     Vital Signs: Temp:  [97.8 F (36.6 C)-100.1 F (37.8 C)] 98.5 F (36.9 C) (08/24 0400) Pulse Rate:  [68-93] 77  (08/24 0600) Resp:  [16-21] 18  (08/24 0600) BP: (103-128)/(58-88) 115/68 mmHg (08/24 0600) SpO2:  [91 %-100 %] 91 % (08/24 0717) FiO2 (%):  [30 %] 30 % (08/24 0718) Weight:  [120.8 kg (266 lb 5.1 oz)-122.3 kg (269 lb 10 oz)] 122.3 kg (269 lb 10 oz) (08/24 0443)  Physical Examination: WUJ:[id number] currently on propofol  HEENT: NCAT, PERRL, EOMi,  PULM: scattered rhonchi CV: RRR, no mgr, no JVD AB: BS+, soft, nontender, no hsm Ext: warm, no edema, no clubbing, no cyanosis Derm: no rash or skin breakdown Neuro: sedated at present  Principal Problem:  *Malignant hypertension requiring acute intensive management Active Problems:  ICH (intracerebral hemorrhage)  HTN (hypertension)  Hypoxemia  Altered mental status  Cytotoxic cerebral edema  Obstructive hydrocephalus  Acute respiratory failure with hypoxia  Elevated  intracranial pressure   ASSESSMENT AND PLAN  NEUROLOGIC CPP 71  ICP 9 8/24 8/20  CT Head >> R thalamic hematoma slightly enlarged with surrounding edema; 2mm midline shift, no change to sl smaller volume of blood   CNS DRAINS: IVC drain 8/20>>8/21 replaced >>  A:   Acute R Basal Ganglia Hemorrhage -hemorrhage with small amt edema Acute resp failure d/t Intraventric blood and hydroceph improve with IVC  ELEVATED ICP with IVC drain. Increased output with drain  P:   Cont IVC drain Per neurosurgery Monitor ICP   PULMONARY No results found for this basename: PHART:5,PCO2:5,PCO2ART:5,PO2ART:5,HCO3:5,O2SAT:5 in the last 168 hours Ventilator Settings: Vent Mode:  [-] CPAP FiO2 (%):  [30 %] 30 % Set Rate:  [16 bmp] 16 bmp Vt Set:  [580 mL] 580 mL PEEP:  [5 cmH20] 5 cmH20 Pressure Support:  [14 cmH20] 14 cmH20 Plateau Pressure:  [17 cmH20-23 cmH20] 21 cmH20 CXR:  CM improved edema ETT:  8/20>>8/22>>reintubated 8/22>>  A:   Acute respiratory failure d/t hydroceph d/t intraventric blood with thalamic bleed  P:   -SBT /WUA daily -for trach AM 8/26 per ENT and I appreciate  CARDIOVASCULAR  Lab 10/07/11 0500 10/06/11 2130 10/06/11 0907 10/02/11 2206  TROPONINI <0.30 <0.30 <0.30 <0.30  LATICACIDVEN -- -- -- --  PROBNP -- -- -- --   ECG:  None Lines: L IJ CVL 8/20>>> 8/16 TTE >> limited study, LVEF 60-70%  A: HTN, neg cardiac enzymes, Bp  soft on vent with propofol  P:  Hold BP meds for now   RENAL  Lab 10/09/11 0300 10/08/11 0315 10/07/11 0500 10/06/11 2030 10/06/11 0355  NA 140 135 137 135 133*  K 3.3* 3.9 -- -- --  CL 101 96 97 97 95*  CO2 31 30 30 28 29   BUN 43* 48* 32* 31* 28*  CREATININE 0.89 0.99 0.95 0.94 0.95  CALCIUM 9.6 9.8 9.5 9.4 9.2  MG -- -- -- -- --  PHOS -- -- -- -- --    Intake/Output Summary (Last 24 hours) at 10/09/11 0835 Last data filed at 10/09/11 0600  Gross per 24 hour  Intake 2259.36 ml  Output   2403 ml  Net -143.64 ml     Foley:  8/16>>>out 8/21>>8/23>>>  A:   Hyponatremia better with IVC  Was prob d/t cerebral salt wasting with hydroceph Hypokalemia  persists P:   Replete K  GASTROINTESTINAL  Lab 10/02/11 2206  AST 24  ALT 22  ALKPHOS 96  BILITOT 0.3  PROT 8.6*  ALBUMIN 3.5    A:   No active GI issues  P:   Cont TF   HEMATOLOGIC  Lab 10/09/11 0300 10/08/11 0315 10/07/11 0500 10/06/11 0355 10/05/11 0415  HGB 11.3* 11.7* 12.3* 12.9* 14.7  HCT 34.3* 35.6* 36.6* 38.3* 42.3  PLT 308 302 256 263 306  INR -- -- -- -- --  APTT -- -- -- -- --   A:   No acute issues  P:  -monitor cbc -ok on LMWH DVT proph    INFECTIOUS  Lab 10/09/11 0300 10/08/11 0315 10/07/11 0500 10/06/11 0355 10/05/11 0415  WBC 17.6* 21.0* 16.0* 12.7* 12.3*  PROCALCITON -- -- -- -- --   Cultures: 8/16 UC>>neg 8/23 CSF>>> 8/23 BCx2>>>neg 8/23 Resp c/s>>>neg  Antibiotics: Zosyn 8/23 (?csf infx)>> vanco 8/23 (? Csf )>> A:   Fever, ? CSF vs pulm source P:   -cont  zosyn/vanco -f/u c/s finals w.a.   ENDOCRINE  Lab 10/08/11 2342 10/08/11 2037 10/08/11 1550 10/08/11 1201 10/08/11 0816  GLUCAP 105* 108* 98 105* 101*   A:   Hyperglycemia  P:   -SSI CBG for tighter control with CVA    BEST PRACTICE / DISPOSITION Level of Care:  ICU Primary Service:  PCCM Consultants:  Neurology, ENT Jenne Pane Code Status:  Full Diet:  TF DVT Px:  LMWH GI Px:  ppi Skin Integrity:  intact Social / Family: family updated fully 8/24   Cc   [name] Beeper  [phone]  Cell  [phone]  If no response or cell goes to voicemail, call beeper [phone]  10/09/2011  8:35 AM

## 2011-10-09 NOTE — Progress Notes (Signed)
Patient ID: Jeremy [name], male   DOB: 1965/06/28, 46 y.o.   MRN: [id number] BP 122/96  Pulse 90  Temp 99 F (37.2 C) (Axillary)  Resp 25  Ht 5\' 10"  (1.778 m)  Wt 122.3 kg (269 lb 10 oz)  BMI 38.69 kg/m2  SpO2 98% Alert, following commands Complaining of chest pain on the left Moving right side well, no movement on left Vertical gaze paresis, impaired left lateral gaze Perrl Intubated Tongue midline ventric draining, csf may show low grade ventriculitis-abx coverage is appropiate

## 2011-10-09 NOTE — Progress Notes (Signed)
Stroke Team Progress Note  HISTORY [name] is a 46 y.o. male  who had been c/o of headaches for few days, with nausea and vomiting,  On 10/01/11 morning according to his wife he had visual disturbance and then he vomited followed by left sided weakness approximately 7 AM. Patient arrived to the ED. Head CT was done that showed, Right BG, bleed with some area involved in thalamus. With minimal mass effect.   According to the wife he has stopped taking his BP medications several years ago and does not go the physician as Suggested. Previously, he had significant neurological symptoms with significant elevated BP that could have been attributed to Hypertensive encephalopathy.   He had right ventriculostomy, later replacement of tubes  SUBJECTIVE Patient reintubated today, alone at bedside, awake, follow command.   Most recent Vital Signs: Temp: 99 F (37.2 C) (08/24 0800) Temp src: Axillary (08/24 0800) BP: 117/65 mmHg (08/24 0800) Pulse Rate: 75  (08/24 0800) Respiratory Rate: 15 O2 Saturdation: 96%  CBG (last 3)   Basename 10/09/11 0844 10/08/11 2342 10/08/11 2037  GLUCAP 86 105* 108*   Intake/Output from previous day: 08/23 0701 - 08/24 0700 In: 2452 [I.V.:622; NG/GT:1120; IV Piggyback:650] Out: 2412 [Urine:2215; Drains:197]  IV Fluid Intake:      . sodium chloride 20 mL/hr at 10/09/11 0600  . fentaNYL infusion INTRAVENOUS 65 mcg/hr (10/09/11 0800)  . propofol 15 mcg/kg/min (10/09/11 0800)   Diet: NPO   Activity: bedrest DVT Prophylaxis:  Lovenox 40 mg sq daily   Studies: Results for orders placed during the hospital encounter of 10/01/11 (from the past 24 hour(s))  URINE CULTURE     Status: Normal   Collection Time   10/08/11  9:22 AM      Component Value Range   Specimen Description URINE, CATHETERIZED     Special Requests NONE     Culture  Setup Time 10/08/2011 15:18     Colony Count NO GROWTH     Culture NO GROWTH     Report Status 10/08/2011 FINAL      CULTURE, BLOOD (ROUTINE X 2)     Status: Normal (Preliminary result)   Collection Time   10/08/11 10:50 AM      Component Value Range   Specimen Description BLOOD RIGHT ANTECUBITAL     Special Requests BOTTLES DRAWN AEROBIC ONLY 5CC     Culture  Setup Time 10/08/2011 14:23     Culture       Value:        BLOOD CULTURE RECEIVED NO GROWTH TO DATE CULTURE WILL BE HELD FOR 5 DAYS BEFORE ISSUING A FINAL NEGATIVE REPORT   Report Status PENDING    CULTURE, BLOOD (ROUTINE X 2)     Status: Normal (Preliminary result)   Collection Time   10/08/11 11:00 AM      Component Value Range   Specimen Description BLOOD LEFT ANTECUBITAL     Special Requests BOTTLES DRAWN AEROBIC ONLY 5CC     Culture  Setup Time 10/08/2011 14:23     Culture       Value:        BLOOD CULTURE RECEIVED NO GROWTH TO DATE CULTURE WILL BE HELD FOR 5 DAYS BEFORE ISSUING A FINAL NEGATIVE REPORT   Report Status PENDING    GLUCOSE, CAPILLARY     Status: Abnormal   Collection Time   10/08/11 12:01 PM      Component Value Range   Glucose-Capillary 105 (*) 70 -  99 mg/dL  GLUCOSE, CAPILLARY     Status: Normal   Collection Time   10/08/11  3:50 PM      Component Value Range   Glucose-Capillary 98  70 - 99 mg/dL   Comment 1 Notify RN     Comment 2 Documented in Chart    GLUCOSE, CAPILLARY     Status: Abnormal   Collection Time   10/08/11  8:37 PM      Component Value Range   Glucose-Capillary 108 (*) 70 - 99 mg/dL   Comment 1 Notify RN     Comment 2 Documented in Chart    GLUCOSE, CAPILLARY     Status: Abnormal   Collection Time   10/08/11 11:42 PM      Component Value Range   Glucose-Capillary 105 (*) 70 - 99 mg/dL  BASIC METABOLIC PANEL     Status: Abnormal   Collection Time   10/09/11  3:00 AM      Component Value Range   Sodium 140  135 - 145 mEq/L   Potassium 3.3 (*) 3.5 - 5.1 mEq/L   Chloride 101  96 - 112 mEq/L   CO2 31  19 - 32 mEq/L   Glucose, Bld 113 (*) 70 - 99 mg/dL   BUN 43 (*) 6 - 23 mg/dL   Creatinine, Ser  4.54  0.50 - 1.35 mg/dL   Calcium 9.6  8.4 - 09.8 mg/dL   GFR calc non Af Amer >90  >90 mL/min   GFR calc Af Amer >90  >90 mL/min  CBC     Status: Abnormal   Collection Time   10/09/11  3:00 AM      Component Value Range   WBC 17.6 (*) 4.0 - 10.5 K/uL   RBC 3.81 (*) 4.22 - 5.81 MIL/uL   Hemoglobin 11.3 (*) 13.0 - 17.0 g/dL   HCT 11.9 (*) 14.7 - 82.9 %   MCV 90.0  78.0 - 100.0 fL   MCH 29.7  26.0 - 34.0 pg   MCHC 32.9  30.0 - 36.0 g/dL   RDW 56.2  13.0 - 86.5 %   Platelets 308  150 - 400 K/uL  GLUCOSE, CAPILLARY     Status: Normal   Collection Time   10/09/11  8:44 AM      Component Value Range   Glucose-Capillary 86  70 - 99 mg/dL     Dg Chest Port 1 View  10/09/2011  Stable support device. 2.  Decreased lung volumes.   10/08/2011 1.  Overall improved aeration with decreased atelectasis at the bases. 2.  Interval placement of a nasogastric tube, which extends beyond the  inferior aspect of the film.      10/07/2011:   Mild cardiac enlargement. 2.  Bibasilar atelectasis. 3.  ET tube tip is stable above the carina.    Physical Exam:   Pleasant middle-aged [place] American gentleman who appears currently not to be in distress.  Awake alert. Afebrile. Head is nontraumatic. Neck is supple without bruit. Hearing is normal. Cardiac exam no murmur or gallop. Lungs are clear to auscultaion. Neurological Exam : awake and follows commands . Mild right gaze preference with skew eye deviation with right eye downward and rightward deviated. Vertical gaze paralysis. Pupils 4 mm sluggishly reactive.  Spontaneous activie of right are and leg, Significant left hemiparesis with 1/5 strength in the left approximately and 2/5 strength in the left lower extremity but poor effort. Diminished sensation on the left. Left plantar  is upgoing. Right is downgoing. Coordination is impaired on the left. Gait was not tested.  ASSESSMENT [name] is a 46 y.o. male with hypertensive right thalamic  intracerebral hemorrhage with intraventricular extension with mild hydrocephalus and cytotoxic cerebral edema. Hemorrhage secondary to accelerated hypertension, BP on arrival 164/125.  Neurologically worsening led to IVC placement and intubation. IVC clotted 10/06/2011 and replaced at night. Remains awake and interactive, following commands. Still on the ventilator  -History of medication noncompliance. -accelerated hypertension -low-grade temperature with leukocytosis. Neurosurgeon took sample of CSF. CSF WBCs were 390. RBC 18050, culture was negative. -dysphagia secondary to stroke -Left hemiparesis  Hospital day # 8     TREATMENT/PLAN -neurosurgery managing IPC. On zosyn now, CSF culture was negative. -agree with plans for trach. PCCM and will consult ENT for placement. -Restart therapies when able  This patient is critically ill and at significant risk of neurological worsening, death and care requires constant monitoring of vital signs, hemodynamics,respiratory and cardiac monitoring,review of multiple databases, neurological assessment, discussion with family, other specialists and medical decision making of high complexity.

## 2011-10-10 DIAGNOSIS — R1310 Dysphagia, unspecified: Secondary | ICD-10-CM

## 2011-10-10 LAB — BASIC METABOLIC PANEL
BUN: 39 mg/dL — ABNORMAL HIGH (ref 6–23)
CO2: 30 mEq/L (ref 19–32)
Calcium: 9.6 mg/dL (ref 8.4–10.5)
Chloride: 107 mEq/L (ref 96–112)
Creatinine, Ser: 0.82 mg/dL (ref 0.50–1.35)
GFR calc Af Amer: >90 mL/min (ref >90)
GFR calc non Af Amer: >90 mL/min (ref >90)
Glucose, Bld: 108 mg/dL — ABNORMAL HIGH (ref 70–99)
Potassium: 4 mEq/L (ref 3.5–5.1)
Sodium: 144 mEq/L (ref 135–145)

## 2011-10-10 LAB — CULTURE, RESPIRATORY W GRAM STAIN

## 2011-10-10 LAB — GLUCOSE, CAPILLARY
Glucose-Capillary: 108 mg/dL — ABNORMAL HIGH (ref 70–99)
Glucose-Capillary: 109 mg/dL — ABNORMAL HIGH (ref 70–99)
Glucose-Capillary: 88 mg/dL (ref 70–99)
Glucose-Capillary: 86 mg/dL (ref 70–99)
Glucose-Capillary: 90 mg/dL (ref 70–99)
Glucose-Capillary: 63 mg/dL — ABNORMAL LOW (ref 70–99)
Glucose-Capillary: 107 mg/dL — ABNORMAL HIGH (ref 70–99)

## 2011-10-10 LAB — VANCOMYCIN, TROUGH: Vancomycin Tr: 8.4 ug/mL — ABNORMAL LOW (ref 10.0–20.0)

## 2011-10-10 LAB — CBC
HCT: 34.6 % — ABNORMAL LOW (ref 39.0–52.0)
Hemoglobin: 11 g/dL — ABNORMAL LOW (ref 13.0–17.0)
MCH: 29.3 pg (ref 26.0–34.0)
MCHC: 31.8 g/dL (ref 30.0–36.0)
MCV: 92.3 fL (ref 78.0–100.0)
Platelets: 333 10*3/uL (ref 150–400)
RBC: 3.75 MIL/uL — ABNORMAL LOW (ref 4.22–5.81)
RDW: 13.9 % (ref 11.5–15.5)
WBC: 13.2 10*3/uL — ABNORMAL HIGH (ref 4.0–10.5)

## 2011-10-10 LAB — CULTURE, RESPIRATORY

## 2011-10-10 MED ORDER — VANCOMYCIN HCL IN DEXTROSE 1-5 GM/200ML-% IV SOLN
1000.0000 mg | Freq: Three times a day (TID) | INTRAVENOUS | Status: DC
  Administered 2011-10-10 – 2011-10-11 (×2): 1000 mg via INTRAVENOUS
  Filled 2011-10-10 (×4): qty 200

## 2011-10-10 NOTE — Progress Notes (Signed)
Patient ID: Jeremy [name], male   DOB: [birth year], 46 y.o.   MRN: [id number] BP 129/69  Pulse 84  Temp 99.8 F (37.7 C) (Axillary)  Resp 21  Ht 5\' 10"  (1.778 m)  Wt 122.8 kg (270 lb 11.6 oz)  BMI 38.84 kg/m2  SpO2 96% Lethargic, arouses easily to voice Follows commands Moving right side well  perrl Ventricular catheter  Is draining well Lab Results  Component Value Date   WBC 13.2* 10/10/2011   HGB 11.0* 10/10/2011   HCT 34.6* 10/10/2011   MCV 92.3 10/10/2011   PLT 333 10/10/2011   White count is dropping appropiately.

## 2011-10-10 NOTE — Progress Notes (Signed)
I have exam patient, agree above.

## 2011-10-10 NOTE — Progress Notes (Signed)
Stroke Team Progress Note  HISTORY Mr. Cristan Scherzer is a 46 y.o. male  who had been c/o of headaches for few days, with nausea and vomiting,  On 10/01/11 morning according to his wife he had visual disturbance and then he vomited followed by left sided weakness approximately 7 AM. Patient arrived to the ED. Head CT was done that showed, Right BG, bleed with some area involved in thalamus. With minimal mass effect.   According to the wife he has stopped taking his BP medications several years ago and does not go the physician as suggested. Previously, he had significant neurological symptoms with significant elevated BP that could have been attributed to hpertensive encephalopathy.   He had right ventriculostomy, later replacement of tubes  SUBJECTIVE    Most recent Vital Signs: Temp: 99.5 F (37.5 C) (08/25 0400) Temp src: Oral (08/25 0400) BP: 118/82 mmHg (08/25 0757) Pulse Rate: 84  (08/25 0757) Respiratory Rate: 18 O2 Saturdation: 97%  CBG (last 3)   Basename 10/10/11 0337 10/09/11 2354 10/09/11 2003  GLUCAP 109* 88 108*   Intake/Output from previous day: 08/24 0701 - 08/25 0700 In: 2202.4 [I.V.:882.4; NG/GT:720; IV Piggyback:600] Out: 1637 [Urine:1450; Drains:187]  IV Fluid Intake:      . sodium chloride 20 mL/hr at 10/09/11 0600  . fentaNYL infusion INTRAVENOUS 65 mcg/hr (10/09/11 1900)  . propofol 15 mcg/kg/min (10/10/11 0837)   Diet: NPO   Activity: bedrest DVT Prophylaxis:  Lovenox 40 mg sq daily   Studies: Results for orders placed during the hospital encounter of 10/01/11 (from the past 24 hour(s))  GLUCOSE, CAPILLARY     Status: Normal   Collection Time   10/09/11 12:04 PM      Component Value Range   Glucose-Capillary 94  70 - 99 mg/dL  GLUCOSE, CAPILLARY     Status: Normal   Collection Time   10/09/11  4:49 PM      Component Value Range   Glucose-Capillary 70  70 - 99 mg/dL  GLUCOSE, CAPILLARY     Status: Abnormal   Collection Time   10/09/11  8:03 PM       Component Value Range   Glucose-Capillary 108 (*) 70 - 99 mg/dL  GLUCOSE, CAPILLARY     Status: Normal   Collection Time   10/09/11 11:54 PM      Component Value Range   Glucose-Capillary 88  70 - 99 mg/dL  BASIC METABOLIC PANEL     Status: Abnormal   Collection Time   10/10/11  3:35 AM      Component Value Range   Sodium 144  135 - 145 mEq/L   Potassium 4.0  3.5 - 5.1 mEq/L   Chloride 107  96 - 112 mEq/L   CO2 30  19 - 32 mEq/L   Glucose, Bld 108 (*) 70 - 99 mg/dL   BUN 39 (*) 6 - 23 mg/dL   Creatinine, Ser 1.61  0.50 - 1.35 mg/dL   Calcium 9.6  8.4 - 09.6 mg/dL   GFR calc non Af Amer >90  >90 mL/min   GFR calc Af Amer >90  >90 mL/min  CBC     Status: Abnormal   Collection Time   10/10/11  3:35 AM      Component Value Range   WBC 13.2 (*) 4.0 - 10.5 K/uL   RBC 3.75 (*) 4.22 - 5.81 MIL/uL   Hemoglobin 11.0 (*) 13.0 - 17.0 g/dL   HCT 04.5 (*) 40.9 - 81.1 %  MCV 92.3  78.0 - 100.0 fL   MCH 29.3  26.0 - 34.0 pg   MCHC 31.8  30.0 - 36.0 g/dL   RDW 16.1  09.6 - 04.5 %   Platelets 333  150 - 400 K/uL  GLUCOSE, CAPILLARY     Status: Abnormal   Collection Time   10/10/11  3:37 AM      Component Value Range   Glucose-Capillary 109 (*) 70 - 99 mg/dL     Dg Chest Port 1 View  10/09/2011  Stable support device. 2.  Decreased lung volumes.   10/08/2011 1.  Overall improved aeration with decreased atelectasis at the bases. 2.  Interval placement of a nasogastric tube, which extends beyond the  inferior aspect of the film.      10/07/2011:   Mild cardiac enlargement. 2.  Bibasilar atelectasis. 3.  ET tube tip is stable above the carina.    Neurological Exam : Mental Status: Alert.  Able to follow 3 step commands without difficulty.  Intubated.  Mitt on right hand. Cranial Nerves: II-blinks to confrontation III/IV/VI-mild right gaze preference.  Vertical gaze paralysis.  Pupils reactive bilaterally. V/VII-unable to assess VIII-grossly intact IX/X-unable to assess XI-unable  to shrug left shoulder XII-unable to assess Motor: flaccid LUE/LLE, 5/5 RUE/RLE Sensory: does not withdraw to noxious stim on left. Deep Tendon Reflexes: 2+ and symmetric throughout Plantars: upgoing on left, downgoing right Cerebellar: unable to assess  ASSESSMENT Mr. Cutler Sunday is a 46 y.o. male with hypertensive right thalamic intracerebral hemorrhage with intraventricular extension with mild hydrocephalus and cytotoxic cerebral edema. Hemorrhage secondary to accelerated hypertension, BP on arrival 164/125.  Neurologically worsening led to IVC placement and intubation. IVC clotted 10/06/2011 and replaced at night. Remains awake and interactive, following commands. Still on the ventilator  -History of medication noncompliance. -accelerated hypertension -low-grade temperature with leukocytosis. Neurosurgeon took sample of CSF. CSF WBCs were 390. RBC 18050, culture was negative. -dysphagia secondary to stroke -Left hemiparesis  Hospital day # 9     TREATMENT/PLAN Continue plan as outlined: -neurosurgery managing IPC. On zosyn now, CSF culture was negative. -agree with plans for trach. PCCM and will consult ENT for placement. -Restart therapies when able  Marya Fossa PA-C [facility] [phone]

## 2011-10-10 NOTE — Progress Notes (Signed)
Name: Jeremy [name] MRN: [id number] DOB: [birth year]    LOS: 9  Referring Provider:  Neuro Reason for Referral:  CVA    PULMONARY / CRITICAL CARE MEDICINE  HPI:  46 yo AAM with PMH of HTN presented to [facility] ED on 8/16 via EMS as a code stroke.  He woke up at 7 am and was unable to ambulate to bathroom, use left hand and was incontinent of urine.  He apparently woke several times during the night to urinate (last at 4am).  He noted right sided headache and vomited in the ambulance.  CT eval demonstrated R basal banglia hemorrhage with minimal surrounding edema.  PCCM consulted for ICU mgmt.     Brief patient description:  46 y/o AAM with HTN admitted with R basal ganglia hemorrhage.   Events Since Admission: 8/16 Admit with basal ganglia hemorrhage 8/20 Intubated for airway protection and elevated ICP with IVC drain placement 8/21 IVC drain replaced d/t prior drain malfunction   Subjective:  Stable on vent overnight. Staph aureus in airway secretions.  For trach in OR in am 8/26  Vital Signs: Temp:  [98.8 F (37.1 C)-99.5 F (37.5 C)] 99.5 F (37.5 C) (08/25 0400) Pulse Rate:  [72-85] 84  (08/25 0757) Resp:  [16-23] 18  (08/25 0757) BP: (101-120)/(52-82) 118/82 mmHg (08/25 0757) SpO2:  [96 %-100 %] 97 % (08/25 0757) FiO2 (%):  [30 %] 30 % (08/25 0757) Weight:  [122.8 kg (270 lb 11.6 oz)] 122.8 kg (270 lb 11.6 oz) (08/25 0500)  Physical Examination: WUJ:[id number] currently on propofol  HEENT: NCAT, PERRL, EOMi,  PULM: scattered rhonchi CV: RRR, no mgr, no JVD AB: BS+, soft, nontender, no hsm Ext: warm, no edema, no clubbing, no cyanosis Derm: no rash or skin breakdown Neuro: sedated at present  Principal Problem:  *Malignant hypertension requiring acute intensive management Active Problems:  ICH (intracerebral hemorrhage)  HTN (hypertension)  Hypoxemia  Altered mental status  Cytotoxic cerebral edema  Obstructive hydrocephalus  Acute respiratory failure with hypoxia  Elevated intracranial pressure   ASSESSMENT AND PLAN  NEUROLOGIC CPP 71  ICP 9 8/25 8/20  CT Head >> R thalamic hematoma slightly enlarged with surrounding edema; 2mm midline shift, no change to sl smaller volume of blood   CNS DRAINS: IVC drain 8/20>>8/21 replaced >>  A:   Acute R Basal Ganglia Hemorrhage -hemorrhage with small amt edema Acute resp failure d/t Intraventric blood and hydroceph improve with IVC  ELEVATED ICP with IVC drain improved with drainage. Increased output with drain  P:   Cont IVC drain Per neurosurgery Monitor ICP   PULMONARY No results found for this basename: PHART:5,PCO2:5,PCO2ART:5,PO2ART:5,HCO3:5,O2SAT:5 in the last 168 hours Ventilator Settings: Vent Mode:  [-] CPAP;PSV FiO2 (%):  [30 %] 30 % Set Rate:  [16 bmp] 16 bmp Vt Set:  [580 mL] 580 mL PEEP:  [5 cmH20] 5 cmH20 Pressure Support:  [14 cmH20] 14 cmH20 Plateau Pressure:  [22 cmH20-23 cmH20] 22 cmH20 CXR:  none ETT:  8/20>>8/22>>reintubated 8/22>>  A:   Acute respiratory failure d/t hydroceph d/t intraventric blood with thalamic bleed  P:   -SBT /WUA daily -for trach AM 8/26 per ENT and I appreciate  CARDIOVASCULAR  Lab 10/07/11 0500 10/06/11 2130 10/06/11 0907  TROPONINI <0.30 <0.30 <0.30  LATICACIDVEN -- -- --  PROBNP -- -- --   ECG:  None Lines: L IJ CVL 8/20>>> 8/16 TTE >> limited study, LVEF 60-70%  A: HTN, neg cardiac enzymes, Bp soft on vent with propofol  P:  Hold BP meds for now   RENAL  Lab 10/10/11 0335 10/09/11 0300 10/08/11 0315 10/07/11 0500 10/06/11 2030  NA 144 140 135 137 135  K 4.0 3.3* -- -- --  CL 107 101 96 97 97  CO2 30 31 30 30 28   BUN 39* 43* 48* 32* 31*  CREATININE 0.82 0.89 0.99 0.95 0.94  CALCIUM 9.6 9.6 9.8 9.5 9.4  MG -- -- -- -- --  PHOS -- -- -- -- --    Intake/Output Summary (Last 24 hours) at 10/10/11 1610 Last data filed at 10/10/11 0700  Gross per 24 hour  Intake 2089.4 ml  Output   1294 ml  Net  795.4 ml    Foley:   8/16>>>out 8/21>>8/23>>>  A:   Hyponatremia better with IVC  Was prob d/t cerebral salt wasting with hydroceph Hypokalemia  persists P:   Replete K  GASTROINTESTINAL No results found for this basename: AST:5,ALT:5,ALKPHOS:5,BILITOT:5,PROT:5,ALBUMIN:5 in the last 168 hours  A:   No active GI issues  P:   Cont TF>>hold after Midnight for am trach   HEMATOLOGIC  Lab 10/10/11 0335 10/09/11 0300 10/08/11 0315 10/07/11 0500 10/06/11 0355  HGB 11.0* 11.3* 11.7* 12.3* 12.9*  HCT 34.6* 34.3* 35.6* 36.6* 38.3*  PLT 333 308 302 256 263  INR -- -- -- -- --  APTT -- -- -- -- --   A:   No acute issues  P:  -monitor cbc -ok on LMWH DVT proph>>hold for trach    INFECTIOUS  Lab 10/10/11 0335 10/09/11 0300 10/08/11 0315 10/07/11 0500 10/06/11 0355  WBC 13.2* 17.6* 21.0* 16.0* 12.7*  PROCALCITON -- -- -- -- --   Cultures: 8/16 UC>>neg 8/23 CSF>>> 8/23 BCx2>>>neg 8/23 Resp c/s>>>neg  Antibiotics: Zosyn 8/23 (?csf infx)>> vanco 8/23 (? Csf )>> A:   Fever, ? CSF vs pulm source P:   -cont  zosyn/vanco -f/u c/s finals w.a.   ENDOCRINE  Lab 10/10/11 0337 10/09/11 2354 10/09/11 2003 10/09/11 1649 10/09/11 1204  GLUCAP 109* 88 108* 70 94   A:   Hyperglycemia  P:   -SSI CBG for tighter control with CVA    BEST PRACTICE / DISPOSITION Level of Care:  ICU Primary Service:  PCCM Consultants:  Neurology, ENT Jenne Pane Code Status:  Full Diet:  TF>>hold for trach DVT Px:  LMWH>>held for trach GI Px:  ppi Skin Integrity:  intact Social / Family: family updated fully 8/24   Cc   [name] Beeper  [phone]  Cell  [phone]  If no response or cell goes to voicemail, call beeper [phone]  10/10/2011  9:18 AM

## 2011-10-10 NOTE — Progress Notes (Signed)
ANTIBIOTIC CONSULT NOTE - FOLLOW UP  Pharmacy Consult for Vancomycin and Zosyn Indication: pneumonia, possible meningitis  No Known Allergies  Patient Measurements: Height: 5\' 10"  (177.8 cm) Weight: 270 lb 11.6 oz (122.8 kg) IBW/kg (Calculated) : 73  Adjusted Body Weight: 88kg  Vital Signs: Temp: 98.3 F (36.8 C) (08/25 1151) Temp src: Oral (08/25 1151) BP: 114/69 mmHg (08/25 1200) Pulse Rate: 84  (08/25 1204) Intake/Output from previous day: 08/24 0701 - 08/25 0700 In: 2202.4 [I.V.:882.4; NG/GT:720; IV Piggyback:600] Out: 1637 [Urine:1450; Drains:187] Intake/Output from this shift: Total I/O In: 237.5 [I.V.:87.5; NG/GT:150] Out: 462 [Urine:425; Drains:37]  Labs:  [facility] 10/10/11 0335 10/09/11 0300 10/08/11 0315  WBC 13.2* 17.6* 21.0*  HGB 11.0* 11.3* 11.7*  PLT 333 308 302  LABCREA -- -- --  CREATININE 0.82 0.89 0.99   Estimated Creatinine Clearance: 149.5 ml/min (by C-G formula based on Cr of 0.82).  Basename 10/10/11 1115  VANCOTROUGH 8.4*  VANCOPEAK --  Drue Dun --  GENTTROUGH --  GENTPEAK --  GENTRANDOM --  TOBRATROUGH --  TOBRAPEAK --  TOBRARND --  AMIKACINPEAK --  AMIKACINTROU --  AMIKACIN --     Microbiology: Recent Results (from the past 720 hour(s))  URINE CULTURE     Status: Normal   Collection Time   10/01/11  8:52 AM      Component Value Range Status Comment   Specimen Description URINE, CATHETERIZED   Final    Special Requests NONE   Final    Culture  Setup Time 10/01/2011 14:57   Final    Colony Count NO GROWTH   Final    Culture NO GROWTH   Final    Report Status 10/02/2011 FINAL   Final   MRSA PCR SCREENING     Status: Normal   Collection Time   10/01/11 10:11 AM      Component Value Range Status Comment   MRSA by PCR NEGATIVE  NEGATIVE Final   CSF CULTURE     Status: Normal (Preliminary result)   Collection Time   10/08/11  7:42 AM      Component Value Range Status Comment   Specimen Description CSF   Final    Special  Requests 0.5ML   Final    Gram Stain     Final    Value: CYTOSPIN WBC PRESENT, PREDOMINANTLY PMN     NO ORGANISMS SEEN     Performed at [facility]   Culture NO GROWTH 1 DAY   Final    Report Status PENDING   Incomplete   GRAM STAIN     Status: Normal   Collection Time   10/08/11  7:42 AM      Component Value Range Status Comment   Specimen Description CSF   Final    Special Requests 0.5ML   Final    Gram Stain     Final    Value: CYTOSPIN SLIDE     WBC PRESENT, PREDOMINANTLY PMN     NO ORGANISMS SEEN   Report Status 10/08/2011 FINAL   Final   CULTURE, RESPIRATORY     Status: Normal (Preliminary result)   Collection Time   10/08/11  8:18 AM      Component Value Range Status Comment   Specimen Description TRACHEAL ASPIRATE   Final    Special Requests NONE   Final    Gram Stain PENDING   Incomplete    Culture     Final    Value: FEW STAPHYLOCOCCUS AUREUS  Note: RIFAMPIN AND GENTAMICIN SHOULD NOT BE USED AS SINGLE DRUGS FOR TREATMENT OF STAPH INFECTIONS.   Report Status PENDING   Incomplete    Organism ID, Bacteria STAPHYLOCOCCUS AUREUS   Final   URINE CULTURE     Status: Normal   Collection Time   10/08/11  9:22 AM      Component Value Range Status Comment   Specimen Description URINE, CATHETERIZED   Final    Special Requests NONE   Final    Culture  Setup Time 10/08/2011 15:18   Final    Colony Count NO GROWTH   Final    Culture NO GROWTH   Final    Report Status 10/08/2011 FINAL   Final   CULTURE, BLOOD (ROUTINE X 2)     Status: Normal (Preliminary result)   Collection Time   10/08/11 10:50 AM      Component Value Range Status Comment   Specimen Description BLOOD RIGHT ANTECUBITAL   Final    Special Requests BOTTLES DRAWN AEROBIC ONLY 5CC   Final    Culture  Setup Time 10/08/2011 14:23   Final    Culture     Final    Value:        BLOOD CULTURE RECEIVED NO GROWTH TO DATE CULTURE WILL BE HELD FOR 5 DAYS BEFORE ISSUING A FINAL NEGATIVE REPORT   Report Status  PENDING   Incomplete   CULTURE, BLOOD (ROUTINE X 2)     Status: Normal (Preliminary result)   Collection Time   10/08/11 11:00 AM      Component Value Range Status Comment   Specimen Description BLOOD LEFT ANTECUBITAL   Final    Special Requests BOTTLES DRAWN AEROBIC ONLY 5CC   Final    Culture  Setup Time 10/08/2011 14:23   Final    Culture     Final    Value:        BLOOD CULTURE RECEIVED NO GROWTH TO DATE CULTURE WILL BE HELD FOR 5 DAYS BEFORE ISSUING A FINAL NEGATIVE REPORT   Report Status PENDING   Incomplete     Anti-infectives     Start     Dose/Rate Route Frequency Ordered Stop   10/08/11 1100  piperacillin-tazobactam (ZOSYN) IVPB 3.375 g       3.375 g 12.5 mL/hr over 240 Minutes Intravenous Every 8 hours 10/08/11 1009     10/08/11 1100   vancomycin (VANCOCIN) 1,250 mg in sodium chloride 0.9 % 250 mL IVPB        1,250 mg 166.7 mL/hr over 90 Minutes Intravenous Every 12 hours 10/08/11 1009            Assessment: Possible pneumonia, possible meningitis:  On Day #3 of empiric antimicrobial therapy with Zosyn and Vancomycin.  His Vancomycin trough is subtherapeutic on his current regimen.  Goal of Therapy:  Vancomycin trough level 15-20 mcg/ml  Plan:  Change Vancomycin to 1gm IV q8h Recheck Vancomycin trough at steady state Continue Zosyn 3.375gm IV q8h extended infusion Monitor renal function and urine output Continue to follow micro results  Estella Husk, Pharm.D., BCPS Clinical Pharmacist  Phone [phone] Pager [phone] 10/10/2011, 1:18 PM

## 2011-10-11 ENCOUNTER — Inpatient Hospital Stay (HOSPITAL_COMMUNITY): Payer: BC Managed Care – PPO | Admitting: Anesthesiology

## 2011-10-11 ENCOUNTER — Encounter (HOSPITAL_COMMUNITY): Payer: Self-pay | Admitting: Anesthesiology

## 2011-10-11 ENCOUNTER — Encounter (HOSPITAL_COMMUNITY)
Admission: EM | Disposition: A | Discharge status: Rehabilitation Facility | Payer: Self-pay | Source: Home / Self Care | Attending: Pulmonary Disease

## 2011-10-11 HISTORY — PX: TRACHEOSTOMY TUBE PLACEMENT: SHX814

## 2011-10-11 LAB — BASIC METABOLIC PANEL
BUN: 37 mg/dL — ABNORMAL HIGH (ref 6–23)
CO2: 30 mEq/L (ref 19–32)
Calcium: 9.7 mg/dL (ref 8.4–10.5)
Chloride: 109 mEq/L (ref 96–112)
Creatinine, Ser: 0.88 mg/dL (ref 0.50–1.35)
GFR calc Af Amer: >90 mL/min (ref >90)
GFR calc non Af Amer: >90 mL/min (ref >90)
Glucose, Bld: 143 mg/dL — ABNORMAL HIGH (ref 70–99)
Potassium: 4.4 mEq/L (ref 3.5–5.1)
Sodium: 146 mEq/L — ABNORMAL HIGH (ref 135–145)

## 2011-10-11 LAB — CSF CULTURE W GRAM STAIN: Culture: NO GROWTH

## 2011-10-11 LAB — GLUCOSE, CAPILLARY
Glucose-Capillary: 120 mg/dL — ABNORMAL HIGH (ref 70–99)
Glucose-Capillary: 135 mg/dL — ABNORMAL HIGH (ref 70–99)
Glucose-Capillary: 103 mg/dL — ABNORMAL HIGH (ref 70–99)
Glucose-Capillary: 82 mg/dL (ref 70–99)
Glucose-Capillary: 81 mg/dL (ref 70–99)
Glucose-Capillary: 68 mg/dL — ABNORMAL LOW (ref 70–99)

## 2011-10-11 LAB — CBC
HCT: 33.4 % — ABNORMAL LOW (ref 39.0–52.0)
Hemoglobin: 10.5 g/dL — ABNORMAL LOW (ref 13.0–17.0)
MCH: 29.2 pg (ref 26.0–34.0)
MCHC: 31.4 g/dL (ref 30.0–36.0)
MCV: 92.8 fL (ref 78.0–100.0)
Platelets: 348 10*3/uL (ref 150–400)
RBC: 3.6 MIL/uL — ABNORMAL LOW (ref 4.22–5.81)
RDW: 13.7 % (ref 11.5–15.5)
WBC: 13.1 10*3/uL — ABNORMAL HIGH (ref 4.0–10.5)

## 2011-10-11 LAB — CSF CULTURE

## 2011-10-11 LAB — TRIGLYCERIDES: Triglycerides: 228 mg/dL — ABNORMAL HIGH (ref <150)

## 2011-10-11 SURGERY — CREATION, TRACHEOSTOMY
Anesthesia: General | Site: Neck | Wound class: Clean

## 2011-10-11 MED ORDER — 0.9 % SODIUM CHLORIDE (POUR BTL) OPTIME
TOPICAL | Status: DC | PRN
  Administered 2011-10-11: 1000 mL

## 2011-10-11 MED ORDER — FENTANYL CITRATE 0.05 MG/ML IJ SOLN: 50.0000 ug | INTRAMUSCULAR | Status: DC | PRN

## 2011-10-11 MED ORDER — LIDOCAINE-EPINEPHRINE 1 %-1:100000 IJ SOLN
INTRAMUSCULAR | Status: AC
  Filled 2011-10-11: qty 1

## 2011-10-11 MED ORDER — VECURONIUM BROMIDE 10 MG IV SOLR
INTRAVENOUS | Status: DC | PRN
  Administered 2011-10-11: 10 mg via INTRAVENOUS

## 2011-10-11 MED ORDER — DEXTROSE 5 % IV SOLN
INTRAVENOUS | Status: DC
  Administered 2011-10-11: 50 mL via INTRAVENOUS
  Administered 2011-10-13: 16:00:00 via INTRAVENOUS

## 2011-10-11 MED ORDER — ROCURONIUM BROMIDE 100 MG/10ML IV SOLN
INTRAVENOUS | Status: DC | PRN
  Administered 2011-10-11: 50 mg via INTRAVENOUS

## 2011-10-11 MED ORDER — LACTATED RINGERS IV SOLN
INTRAVENOUS | Status: DC | PRN
  Administered 2011-10-11: 13:00:00 via INTRAVENOUS

## 2011-10-11 MED ORDER — PHENYLEPHRINE HCL 10 MG/ML IJ SOLN
INTRAMUSCULAR | Status: DC | PRN
  Administered 2011-10-11 (×2): 80 ug via INTRAVENOUS

## 2011-10-11 MED ORDER — LIDOCAINE-EPINEPHRINE 1 %-1:100000 IJ SOLN
INTRAMUSCULAR | Status: DC | PRN
  Administered 2011-10-11: 3 mL

## 2011-10-11 MED ORDER — FENTANYL CITRATE 0.05 MG/ML IJ SOLN
INTRAMUSCULAR | Status: DC | PRN
  Administered 2011-10-11: 100 ug via INTRAVENOUS
  Administered 2011-10-11: 50 ug via INTRAVENOUS

## 2011-10-11 MED ORDER — MIDAZOLAM HCL 2 MG/2ML IJ SOLN: 1.0000 mg | INTRAMUSCULAR | Status: DC | PRN

## 2011-10-11 SURGICAL SUPPLY — 37 items
BLADE SURG 15 STRL LF DISP TIS (BLADE) ×1 IMPLANT
BLADE SURG 15 STRL SS (BLADE) ×2
BLADE SURG ROTATE 9660 (MISCELLANEOUS) IMPLANT
CANISTER SUCTION 2500CC (MISCELLANEOUS) ×2 IMPLANT
CLEANER TIP ELECTROSURG 2X2 (MISCELLANEOUS) ×2 IMPLANT
CLOTH BEACON ORANGE TIMEOUT ST (SAFETY) ×2 IMPLANT
COVER SURGICAL LIGHT HANDLE (MISCELLANEOUS) ×2 IMPLANT
CRADLE DONUT ADULT HEAD (MISCELLANEOUS) IMPLANT
ELECT COATED BLADE 2.86 ST (ELECTRODE) ×2 IMPLANT
ELECT REM PT RETURN 9FT ADLT (ELECTROSURGICAL) ×2
ELECTRODE REM PT RTRN 9FT ADLT (ELECTROSURGICAL) ×1 IMPLANT
GAUZE SPONGE 4X4 16PLY XRAY LF (GAUZE/BANDAGES/DRESSINGS) ×2 IMPLANT
GLOVE BIO SURGEON STRL SZ7.5 (GLOVE) ×2 IMPLANT
GLOVE SS N UNI LF 8.0 STRL (GLOVE) ×1 IMPLANT
GOWN STRL NON-REIN LRG LVL3 (GOWN DISPOSABLE) ×4 IMPLANT
HOLDER TRACH TUBE VELCRO 19.5 (MISCELLANEOUS) ×2 IMPLANT
KIT BASIN OR (CUSTOM PROCEDURE TRAY) ×2 IMPLANT
KIT ROOM TURNOVER OR (KITS) ×2 IMPLANT
KIT SUCTION CATH 14FR (SUCTIONS) IMPLANT
NS IRRIG 1000ML POUR BTL (IV SOLUTION) ×2 IMPLANT
PACK EENT II TURBAN DRAPE (CUSTOM PROCEDURE TRAY) ×2 IMPLANT
PAD ARMBOARD 7.5X6 YLW CONV (MISCELLANEOUS) ×4 IMPLANT
PENCIL BUTTON HOLSTER BLD 10FT (ELECTRODE) ×2 IMPLANT
SPONGE DRAIN TRACH 4X4 STRL 2S (GAUZE/BANDAGES/DRESSINGS) ×2 IMPLANT
SPONGE INTESTINAL PEANUT (DISPOSABLE) IMPLANT
SUT SILK 0 FSL (SUTURE) ×2 IMPLANT
SUT SILK 2 0 REEL (SUTURE) ×2 IMPLANT
SUT SILK 2 0 SH CR/8 (SUTURE) ×2 IMPLANT
SUT VIC AB 2-0 FS1 27 (SUTURE) IMPLANT
SYR 20ML ECCENTRIC (SYRINGE) ×2 IMPLANT
SYR BULB 3OZ (MISCELLANEOUS) ×2 IMPLANT
SYR CONTROL 10ML LL (SYRINGE) IMPLANT
TOWEL OR 17X24 6PK STRL BLUE (TOWEL DISPOSABLE) ×2 IMPLANT
TOWEL OR 17X26 10 PK STRL BLUE (TOWEL DISPOSABLE) ×2 IMPLANT
TUBE CONNECTING 12X1/4 (SUCTIONS) ×2 IMPLANT
TUBE TRACH SHILEY 8 DIST CUF (TUBING) ×1 IMPLANT
WATER STERILE IRR 1000ML POUR (IV SOLUTION) ×2 IMPLANT

## 2011-10-11 NOTE — Progress Notes (Signed)
Hypoglycemia;   D5 initiated. Tube feed requirements to be evaluated in am.

## 2011-10-11 NOTE — Progress Notes (Signed)
Name: Jeremy [name] MRN: [id number] DOB: September 10, 1965    LOS: 10  Referring Provider:  Neuro Reason for Referral:  CVA    PULMONARY / CRITICAL CARE MEDICINE  HPI:  46 yo AAM with PMH of HTN presented to [facility] ED on 8/16 via EMS as a code stroke.  He woke up at 7 am and was unable to ambulate to bathroom, use left hand and was incontinent of urine.  He apparently woke several times during the night to urinate (last at 4am).  He noted right sided headache and vomited in the ambulance.  CT eval demonstrated R basal banglia hemorrhage with minimal surrounding edema.  PCCM consulted for ICU mgmt.    Brief patient description:  46 y/o AAM with HTN admitted with R basal ganglia hemorrhage.   Events Since Admission: 8/16 Admit with basal ganglia hemorrhage 8/20 Intubated for airway protection and elevated ICP with IVC drain placement 8/21 IVC drain replaced d/t prior drain malfunction  Subjective:  No changes o/n reported  For trach today at 12:00, Dr Jenne Pane  Vital Signs: Temp:  [98.3 F (36.8 C)-99.8 F (37.7 C)] 99.8 F (37.7 C) (08/26 0800) Pulse Rate:  [73-90] 90  (08/26 0900) Resp:  [16-24] 24  (08/26 0900) BP: (110-134)/(61-88) 130/67 mmHg (08/26 0900) SpO2:  [95 %-100 %] 100 % (08/26 0900) FiO2 (%):  [30 %] 30 % (08/26 0843)  Intake/Output Summary (Last 24 hours) at 10/11/11 0910 Last data filed at 10/11/11 0800  Gross per 24 hour  Intake 1918.57 ml  Output   1852 ml  Net  66.57 ml    Physical Examination: Gen: ill appearing, ventilated HEENT: NCAT, PERRL, EOMi, ETT in place PULM: scattered rhonchi CV: RRR, no mgr, no JVD AB: BS+, soft, nontender, no hsm Ext: warm, no edema, no clubbing, no cyanosis Derm: no rash or skin breakdown Neuro: sedated at present  Principal Problem:  *Malignant hypertension requiring acute intensive management Active Problems:  ICH (intracerebral hemorrhage)  HTN (hypertension)  Hypoxemia  Altered mental status  Cytotoxic cerebral  edema  Obstructive hydrocephalus  Acute respiratory failure with hypoxia  Elevated intracranial pressure   ASSESSMENT AND PLAN  NEUROLOGIC CPP 71  ICP 9 8/25 8/20  CT Head >> R thalamic hematoma slightly enlarged with surrounding edema; 2mm midline shift, no change to sl smaller volume of blood  CNS DRAINS: IVC drain 8/20>>8/21 replaced >>  A:   Acute R Basal Ganglia Hemorrhage -hemorrhage with small amt edema Acute resp failure d/t Intraventric blood and hydroceph improve with IVC  ELEVATED ICP with IVC drain improved with drainage. Increased output with drain P:   Cont IVC drain, note CSF cultures negative Per neurosurgery Monitor ICP   PULMONARY No results found for this basename: PHART:5,PCO2:5,PCO2ART:5,PO2ART:5,HCO3:5,O2SAT:5 in the last 168 hours Ventilator Settings: Vent Mode:  [-] PSV FiO2 (%):  [30 %] 30 % Set Rate:  [16 bmp] 16 bmp Vt Set:  [580 mL] 580 mL PEEP:  [5 cmH20] 5 cmH20 Pressure Support:  [12 cmH20-14 cmH20] 12 cmH20 Plateau Pressure:  [22 cmH20-24 cmH20] 23 cmH20 CXR:  none ETT:  8/20>>8/22>>reintubated 8/22>>  A:   Acute respiratory failure d/t hydroceph d/t intraventric blood with thalamic bleed P:   -SBT /WUA daily -for trach today  CARDIOVASCULAR  Lab 10/07/11 0500 10/06/11 2130 10/06/11 0907  TROPONINI <0.30 <0.30 <0.30  LATICACIDVEN -- -- --  PROBNP -- -- --   ECG:  None Lines: L IJ CVL 8/20>>> 8/16 TTE >> limited study, LVEF 60-70%  A: HTN, neg cardiac enzymes,  P:  Prn BP meds ordered, no scheduled meds at this time   RENAL  Lab 10/11/11 0338 10/10/11 0335 10/09/11 0300 10/08/11 0315 10/07/11 0500  NA 146* 144 140 135 137  K 4.4 4.0 -- -- --  CL 109 107 101 96 97  CO2 30 30 31 30 30   BUN 37* 39* 43* 48* 32*  CREATININE 0.88 0.82 0.89 0.99 0.95  CALCIUM 9.7 9.6 9.6 9.8 9.5  MG -- -- -- -- --  PHOS -- -- -- -- --    Intake/Output Summary (Last 24 hours) at 10/11/11 0909 Last data filed at 10/11/11 0800  Gross per  24 hour  Intake 1918.57 ml  Output   1852 ml  Net  66.57 ml    Foley:  8/16>>>out 8/21>>8/23>>>  A:   Hyponatremia better with IVC  Was prob d/t cerebral salt wasting with hydroceph Hypokalemia  persists P:   Follow bmp  GASTROINTESTINAL No results found for this basename: AST:5,ALT:5,ALKPHOS:5,BILITOT:5,PROT:5,ALBUMIN:5 in the last 168 hours  A:   No active GI issues P:   Cont TF>>held after Midnight for trach  HEMATOLOGIC  Lab 10/11/11 0338 10/10/11 0335 10/09/11 0300 10/08/11 0315 10/07/11 0500  HGB 10.5* 11.0* 11.3* 11.7* 12.3*  HCT 33.4* 34.6* 34.3* 35.6* 36.6*  PLT 348 333 308 302 256  INR -- -- -- -- --  APTT -- -- -- -- --   A:   No acute issues P:  -monitor cbc -ok on LMWH DVT proph>>hold for trach   INFECTIOUS  Lab 10/11/11 0338 10/10/11 0335 10/09/11 0300 10/08/11 0315 10/07/11 0500  WBC 13.1* 13.2* 17.6* 21.0* 16.0*  PROCALCITON -- -- -- -- --   Cultures: 8/16 UC>>neg 8/23 CSF>>> negative 8/23 BCx2>>>neg 8/23 Resp c/s>>> rare S aureus  Antibiotics: Zosyn 8/23 (?csf infx)>> vanco 8/23 (? Csf )>> 8/26 A:   Fever, ? CSF vs pulm source, CXR with low lung volumes P:   -all cx negative except rare staph in sputum, plan d/c vanco 8/26 and then d/c zosyn on 8/27 after trach if no changes   ENDOCRINE  Lab 10/11/11 0749 10/11/11 0349 10/11/11 0133 10/10/11 1953 10/10/11 1545  GLUCAP 103* 135* 120* 107* 90   A:   Hyperglycemia  P:   -SSI CBG for tighter control with CVA   BEST PRACTICE / DISPOSITION Level of Care:  ICU Primary Service:  PCCM Consultants:  Neurology, ENT Jenne Pane Code Status:  Full Diet:  TF>>held for trach DVT Px:  LMWH>>held for trach GI Px:  ppi Skin Integrity:  intact Social / Family: spoke w family at 8/26  CC  Levy Pupa, MD, PhD 10/11/2011, 9:10 AM [facility] [phone] or if no answer [phone]

## 2011-10-11 NOTE — Transfer of Care (Signed)
Immediate Anesthesia Transfer of Care Note  Patient: Jeremy [name]  Procedure(s) Performed: Procedure(s) (LRB): TRACHEOSTOMY (N/A)  Patient Location: ICU  Anesthesia Type: General  Level of Consciousness: Patient remains intubated per anesthesia plan  Airway & Oxygen Therapy: Patient placed on Ventilator (see vital sign flow sheet for setting)  Post-op Assessment: Report given to PACU RN  Post vital signs: Reviewed and stable  Complications: No apparent anesthesia complications

## 2011-10-11 NOTE — Anesthesia Preprocedure Evaluation (Addendum)
Anesthesia Evaluation  Patient identified by MRN, date of birth, ID band  Reviewed: Allergy & Precautions, H&P , NPO status , Patient's Chart, lab work & pertinent test results  Airway       Dental  (+) Teeth Intact   Pulmonary  Respiratory failure s/p CVA breath sounds clear to auscultation  Pulmonary exam normal       Cardiovascular hypertension, Pt. on medications Rhythm:Regular Rate:Normal     Neuro/Psych CVA, Residual Symptoms    GI/Hepatic   Endo/Other    Renal/GU      Musculoskeletal   Abdominal Normal abdominal exam  (+)   Peds  Hematology   Anesthesia Other Findings Unable to examine airway or give dental advisory due to being intubated prior to arrival. Pt unable to give verbal hx.  Reproductive/Obstetrics                          Anesthesia Physical Anesthesia Plan  ASA: IV  Anesthesia Plan: General   Post-op Pain Management:    Induction: Intravenous and Inhalational  Airway Management Planned: Oral ETT and Tracheostomy  Additional Equipment:   Intra-op Plan:   Post-operative Plan: Post-operative intubation/ventilation  Informed Consent: I have reviewed the patients History and Physical, chart, labs and discussed the procedure including the risks, benefits and alternatives for the proposed anesthesia with the patient or authorized representative who has indicated his/her understanding and acceptance.   Dental advisory given  Plan Discussed with: CRNA, Anesthesiologist and Surgeon  Anesthesia Plan Comments:        Anesthesia Quick Evaluation

## 2011-10-11 NOTE — Preoperative (Signed)
Beta Blockers   Reason not to administer Beta Blockers:Not Applicable 

## 2011-10-11 NOTE — Anesthesia Postprocedure Evaluation (Signed)
  Anesthesia Post-op Note  Patient: Jeremy [name]  Procedure(s) Performed: Procedure(s) (LRB): TRACHEOSTOMY (N/A)  Patient Location: SICU  Anesthesia Type: General  Level of Consciousness: sedated  Airway and Oxygen Therapy: Patient placed on Ventilator (see vital sign flow sheet for setting)  Post-op Pain: none  Post-op Assessment: Post-op Vital signs reviewed  Post-op Vital Signs: Reviewed  Complications: No apparent anesthesia complications and pt placed on vent through trach site until able to be weaned.

## 2011-10-11 NOTE — Progress Notes (Signed)
Overall stable. No new issues. Patient wide awake following commands with a left-sided dense hemiparesis   Afebrile. His vitals are stable. CSF cultures negative. Remains on prophylactic antibiotics.  Still requiring continued CSF diversion at approximately 175 cc per day. No signs of this slowing  Continue external ventricular drainage. Continue antibiotics. Patient will likely require ventriculoperitoneal shunt early next week .

## 2011-10-11 NOTE — Progress Notes (Signed)
Stroke Team Progress Note  HISTORY [name] is a 46 y.o. male  who had been c/o of headaches for few days, with nausea and vomiting,  On 10/01/11 morning according to his wife he had visual disturbance and then he vomited followed by left sided weakness approximately 7 AM. Patient arrived to the ED. Head CT was done that showed, Right BG, bleed with some area involved in thalamus. With minimal mass effect.   According to the wife he has stopped taking his BP medications several years ago and does not go the physician as suggested. Previously, he had significant neurological symptoms with significant elevated BP that could have been attributed to hpertensive encephalopathy.   He had right ventriculostomy, later replacement of tube due to blockage.  SUBJECTIVE Wife at bedside, signing papers for procedure later today.  Filed Vitals:   10/11/11 0400 10/11/11 0500 10/11/11 0600 10/11/11 0700  BP: 122/67 126/76 126/76 116/71  Pulse: 80 81 81 74  Temp: 98.6 F (37 C)     TempSrc: Oral     Resp: 16 16 16 16   Height:      Weight:      SpO2: 100% 100% 100% 100%   CBG (last 3)   Basename 10/10/11 1953 10/10/11 1545 10/10/11 1123  GLUCAP 107* 90 86   Intake/Output from previous day: 08/25 0701 - 08/26 0700 In: 1400 [I.V.:420; NG/GT:480; IV Piggyback:500] Out: 1950 [Urine:1775; Drains:175]  IV Fluid Intake:     . sodium chloride 20 mL/hr at 10/09/11 0600  . fentaNYL infusion INTRAVENOUS 65 mcg/hr (10/10/11 1804)  . propofol 15 mcg/kg/min (10/10/11 1555)   Diet: NPO   Activity: bedrest DVT Prophylaxis:  Lovenox 40 mg sq daily   Studies: Results for orders placed during the hospital encounter of 10/01/11 (from the past 24 hour(s))  GLUCOSE, CAPILLARY     Status: Abnormal   Collection Time   10/10/11  8:34 AM      Component Value Range   Glucose-Capillary 63 (*) 70 - 99 mg/dL  VANCOMYCIN, TROUGH     Status: Abnormal   Collection Time   10/10/11 11:15 AM      Component  Value Range   Vancomycin Tr 8.4 (*) 10.0 - 20.0 ug/mL  GLUCOSE, CAPILLARY     Status: Normal   Collection Time   10/10/11 11:23 AM      Component Value Range   Glucose-Capillary 86  70 - 99 mg/dL  GLUCOSE, CAPILLARY     Status: Normal   Collection Time   10/10/11  3:45 PM      Component Value Range   Glucose-Capillary 90  70 - 99 mg/dL  GLUCOSE, CAPILLARY     Status: Abnormal   Collection Time   10/10/11  7:53 PM      Component Value Range   Glucose-Capillary 107 (*) 70 - 99 mg/dL  CBC     Status: Abnormal   Collection Time   10/11/11  3:38 AM      Component Value Range   WBC 13.1 (*) 4.0 - 10.5 K/uL   RBC 3.60 (*) 4.22 - 5.81 MIL/uL   Hemoglobin 10.5 (*) 13.0 - 17.0 g/dL   HCT 16.1 (*) 09.6 - 04.5 %   MCV 92.8  78.0 - 100.0 fL   MCH 29.2  26.0 - 34.0 pg   MCHC 31.4  30.0 - 36.0 g/dL   RDW 40.9  81.1 - 91.4 %   Platelets 348  150 - 400 K/uL  BASIC METABOLIC PANEL     Status: Abnormal   Collection Time   10/11/11  3:38 AM      Component Value Range   Sodium 146 (*) 135 - 145 mEq/L   Potassium 4.4  3.5 - 5.1 mEq/L   Chloride 109  96 - 112 mEq/L   CO2 30  19 - 32 mEq/L   Glucose, Bld 143 (*) 70 - 99 mg/dL   BUN 37 (*) 6 - 23 mg/dL   Creatinine, Ser 1.61  0.50 - 1.35 mg/dL   Calcium 9.7  8.4 - 09.6 mg/dL   GFR calc non Af Amer >90  >90 mL/min   GFR calc Af Amer >90  >90 mL/min  TRIGLYCERIDES     Status: Abnormal   Collection Time   10/11/11  3:38 AM      Component Value Range   Triglycerides 228 (*) <150 mg/dL    Dg Chest Port 1 View 10/09/2011  Stable support device. 2.  Decreased lung volumes.  10/08/2011 1.  Overall improved aeration with decreased atelectasis at the bases. 2.  Interval placement of a nasogastric tube, which extends beyond the  inferior aspect of the film.     10/07/2011:   Mild cardiac enlargement. 2.  Bibasilar atelectasis. 3.  ET tube tip is stable above the carina.    Neurological Exam : Mental Status: Alert.  Able to follow 3 step commands without  difficulty.  Intubated.  Mitt on right hand. Cranial Nerves: II-blinks to confrontation less on left than right. III/IV/VI-mild right gaze preference.  Vertical gaze paralysis.  Pupils reactive bilaterally. V/VII-unable to assess VIII-grossly intact IX/X-unable to assess XI-unable to shrug left shoulder XII-unable to assess Motor: flaccid LUE/LLE, 5/5 RUE/RLE Sensory: does not withdraw to noxious stim on left. Deep Tendon Reflexes: 2+ and symmetric throughout Plantars: upgoing on left, downgoing right Cerebellar: unable to assess  ASSESSMENT [name] is a 46 y.o. male with hypertensive right thalamic intracerebral hemorrhage with intraventricular extension with mild hydrocephalus and cytotoxic cerebral edema. Hemorrhage secondary to accelerated hypertension, BP on arrival 164/125.  Neurologically worsening led to IVC placement and intubation. IVC clotted 10/06/2011 and replaced, continues to drain ~148ml/day. Likely VP shunt early next week. Remains awake and interactive, following commands. Still on the ventilator  -History of medication noncompliance. -accelerated hypertension -low-grade temperature with leukocytosis. Neurosurgeon took sample of CSF. CSF WBCs were 390. RBC 18050, culture was negative. Ucx neg. Blood Cx no growth. On Zosyn (plan stop 8/27 after trach) and Vanc (plan stop 8/26) for possible PNA and prophylactic meningitis.  -dysphagia secondary to stroke. On tube feedings. -Left hemiparesis  Hospital day # 10  TREATMENT/PLAN -neurosurgery managing IVC. On zosyn and Vancomycin  now, CSF culture was negative -ENT to place trach today. Dr. Jenne [name]. -will likely need PEG. -Restart therapies when able  Dr. Pearlean [name] discussed with Dr. Delton [name] and family member This patient is critically ill and at significant risk of neurological worsening, death and care requires constant monitoring of vital signs, hemodynamics,respiratory and cardiac monitoring,review of multiple  databases, neurological assessment, discussion with family, other specialists and medical decision making of high complexity. I spent 30 minutes of neurocritical care time  in the care of  this patient.  Annie Main, MSN, RN, ANVP-BC, ANP-BC, [facility] Pager: [phone] 10/11/2011 8:12 AM  Scribe for Dr. Delia Heady, Stroke Center Medical Director, who has personally reviewed chart, pertinent data, examined the patient and developed the plan of care. Pager:  [phone]  

## 2011-10-11 NOTE — Brief Op Note (Signed)
10/01/2011 - 10/11/2011  1:35 PM  PATIENT:  Jeremy [name]  46 y.o. male  PRE-OPERATIVE DIAGNOSIS:  RESPIRATORY FAILURE  POST-OPERATIVE DIAGNOSIS:  RESPIRATORY FAILURE  PROCEDURE:  Procedure(s) (LRB): TRACHEOSTOMY (N/A)  SURGEON:  Surgeon(s) and Role:    * Christia Reading, MD - Primary  PHYSICIAN ASSISTANT:   ASSISTANTS: none   ANESTHESIA:   general  EBL:  Total I/O In: 619 [I.V.:569; IV Piggyback:50] Out: 367 [Urine:340; Drains:27]  BLOOD ADMINISTERED:none  DRAINS: none   LOCAL MEDICATIONS USED:  LIDOCAINE   SPECIMEN:  No Specimen  DISPOSITION OF SPECIMEN:  N/A  COUNTS:  YES  TOURNIQUET:  * No tourniquets in log *  DICTATION: .Other Dictation: Dictation Number [id number]  PLAN OF CARE: Return to ICU  PATIENT DISPOSITION:  ICU - intubated and hemodynamically stable.   Delay start of Pharmacological VTE agent (>24hrs) due to surgical blood loss or risk of bleeding: no

## 2011-10-12 ENCOUNTER — Inpatient Hospital Stay (HOSPITAL_COMMUNITY): Payer: BC Managed Care – PPO

## 2011-10-12 ENCOUNTER — Encounter (HOSPITAL_COMMUNITY): Payer: Self-pay | Admitting: Otolaryngology

## 2011-10-12 LAB — CBC
HCT: 32 % — ABNORMAL LOW (ref 39.0–52.0)
Hemoglobin: 10.2 g/dL — ABNORMAL LOW (ref 13.0–17.0)
MCH: 29.4 pg (ref 26.0–34.0)
MCHC: 31.9 g/dL (ref 30.0–36.0)
MCV: 92.2 fL (ref 78.0–100.0)
Platelets: 363 10*3/uL (ref 150–400)
RBC: 3.47 MIL/uL — ABNORMAL LOW (ref 4.22–5.81)
RDW: 13.8 % (ref 11.5–15.5)
WBC: 16.2 10*3/uL — ABNORMAL HIGH (ref 4.0–10.5)

## 2011-10-12 LAB — BASIC METABOLIC PANEL
BUN: 27 mg/dL — ABNORMAL HIGH (ref 6–23)
CO2: 29 mEq/L (ref 19–32)
Calcium: 9.8 mg/dL (ref 8.4–10.5)
Chloride: 108 mEq/L (ref 96–112)
Creatinine, Ser: 0.85 mg/dL (ref 0.50–1.35)
GFR calc Af Amer: >90 mL/min (ref >90)
GFR calc non Af Amer: >90 mL/min (ref >90)
Glucose, Bld: 107 mg/dL — ABNORMAL HIGH (ref 70–99)
Potassium: 4.5 mEq/L (ref 3.5–5.1)
Sodium: 143 mEq/L (ref 135–145)

## 2011-10-12 LAB — GLUCOSE, CAPILLARY
Glucose-Capillary: 124 mg/dL — ABNORMAL HIGH (ref 70–99)
Glucose-Capillary: 84 mg/dL (ref 70–99)
Glucose-Capillary: 93 mg/dL (ref 70–99)
Glucose-Capillary: 77 mg/dL (ref 70–99)
Glucose-Capillary: 94 mg/dL (ref 70–99)
Glucose-Capillary: 94 mg/dL (ref 70–99)
Glucose-Capillary: 87 mg/dL (ref 70–99)

## 2011-10-12 MED ORDER — DEXTROSE 50 % IV SOLN: 50.0000 mL | Freq: Once | INTRAVENOUS | Status: AC | PRN

## 2011-10-12 MED ORDER — CEFAZOLIN SODIUM-DEXTROSE 2-3 GM-% IV SOLR: 2.0000 g | Freq: Once | INTRAVENOUS | Status: DC

## 2011-10-12 MED ORDER — DEXTROSE 50 % IV SOLN
25.0000 mL | Freq: Once | INTRAVENOUS | Status: AC | PRN
  Administered 2011-10-12: 25 mL via INTRAVENOUS

## 2011-10-12 MED ORDER — DEXTROSE 50 % IV SOLN
INTRAVENOUS | Status: AC
  Filled 2011-10-12: qty 50

## 2011-10-12 MED ORDER — DEXTROSE 5 % IV SOLN
3.0000 g | INTRAVENOUS | Status: AC
  Administered 2011-10-13: 3 g via INTRAVENOUS
  Filled 2011-10-12: qty 3000

## 2011-10-12 NOTE — Op Note (Signed)
NAMEDERAY, DAWES NO.:  [id number]  MEDICAL RECORD NO.:  [id number]  LOCATION:  3107                         FACILITY:  [facility]  PHYSICIAN:  Antony Contras, MD     DATE OF BIRTH:  09/08/1965  DATE OF PROCEDURE:  10/11/2011 DATE OF DISCHARGE:                              OPERATIVE REPORT   PREOPERATIVE DIAGNOSIS:  Respiratory failure.  POSTOPERATIVE DIAGNOSIS:  Respiratory failure.  PROCEDURE:  Tracheostomy.  SURGEON:  Antony Contras, MD  ANESTHESIA:  General endotracheal anesthesia.  COMPLICATIONS:  None.  INDICATION:  The patient is a 46 year old male who was admitted on October 01, 2011, to the hospital with a hemorrhagic stroke.  He required endotracheal intubation soon after admission and has been unable to be successfully extubated since that time.  He presents to the operating room for surgical management.  FINDINGS:  The neck was thick with otherwise normal anatomy.  DESCRIPTION OF PROCEDURE:  The patient was identified in the holding room and informed consent having been obtained including the discussion of risks, benefits, and alternatives, and the patient was brought to the operative suite, put on the operating table in supine position. Anesthesia was induced.  The patient was maintained via endotracheal anesthesia.  A shoulder roll was placed and the trach site incision was marked with a marking pen and injected with 1% lidocaine with 1:100,000 epinephrine.  The neck was prepped and draped in sterile fashion.  The incision was made with a 15-blade scalpel through the skin and then extended down below through the subcutaneous tissues using Bovie electrocautery.  A segment of the subcutaneous fat was removed using Bovie electrocautery.  Further dissection was performed through the fat down to the strap muscles, which were divided in the midline down the midline raphe.  Strap muscles were retracted on either side exposing the thyroid isthmus.   The isthmus was then elevated off of the trachea by first incising over the cricoid cartilage and then elevating with right- angle forceps.  The isthmus was then clamped and divided using Bovie electrocautery and ligated using 2-0 silk suture.  With soft tissues then swept off the trachea, a cricoid hook was then used to elevate the cricoid cartilage.  A horizontal incision was made between rings 2 and 3 of the trachea using a 15-blade scalpel and extended with scissors.  A 2- 0 silk suture was then placed around the ring above and the ring below the trach site as stay sutures.  The endotracheal tube cuff was deflated and the tube was backed out to above the trach site.  A #8 cuffed Shiley trach tube was then placed into the tracheostomy site without difficulty and the cuff inflated.  The inner cannula was inserted and the anesthesia circuit was hooked up and the patient was successfully ventilated.  These stay sutures were tied with one knot below and two knots above the trach site.  The trach flange was then secured at the skin using 0 silk suture in 4 quadrants.  The patient was then cleaned off and a trach dressing and trach tie were added.  The patient was returned to anesthesia for wake- up and was moved to  the intensive care unit in stable condition.     Antony Contras, MD     DDB/MEDQ  D:  10/11/2011  T:  10/12/2011  Job:  [id number]

## 2011-10-12 NOTE — Progress Notes (Signed)
Stroke Team Progress Note  HISTORY Jeremy [name] is a 46 y.o. male  who had been c/o of headaches for few days, with nausea and vomiting,  On 10/01/11 morning according to his wife he had visual disturbance and then he vomited followed by left sided weakness approximately 7 AM. Patient arrived to the ED. Head CT was done that showed, Right BG, bleed with some area involved in thalamus. With minimal mass effect.   According to the wife he has stopped taking his BP medications several years ago and does not go the physician as suggested. Previously, he had significant neurological symptoms with significant elevated BP that could have been attributed to hpertensive encephalopathy.   He had right ventriculostomy, later replacement of tube due to blockage.  SUBJECTIVE RN at bedside. Patient on vent overnight; now on trach collar.  Filed Vitals:   10/12/11 0600 10/12/11 0700 10/12/11 0800 10/12/11 0811  BP: 115/67 107/68    Pulse: 83 79    Temp:   98.6 F (37 C)   TempSrc:   Axillary   Resp: 16 17    Height:      Weight:      SpO2: 100% 100%  99%   CBG (last 3)   Basename 10/12/11 0354 10/12/11 0121 10/12/11 0012  GLUCAP 93 124* 84   Intake/Output from previous day: 08/26 0701 - 08/27 0700 In: 1797.1 [I.V.:1647.1; IV Piggyback:150] Out: 1897 [Urine:1715; Drains:177; Blood:5]  IV Fluid Intake:     . sodium chloride 10 mL/hr at 10/12/11 0800  . dextrose 50 mL/hr at 10/12/11 0800  . fentaNYL infusion INTRAVENOUS 50 mcg/hr (10/12/11 0800)  . propofol 20 mcg/kg/min (10/12/11 0800)   Diet:     Activity: bedrest DVT Prophylaxis:  Lovenox 40 mg sq daily   Studies: Results for orders placed during the hospital encounter of 10/01/11 (from the past 24 hour(s))  GLUCOSE, CAPILLARY     Status: Normal   Collection Time   10/11/11 12:00 PM      Component Value Range   Glucose-Capillary 82  70 - 99 mg/dL  GLUCOSE, CAPILLARY     Status: Normal   Collection Time   10/11/11  4:19 PM     Component Value Range   Glucose-Capillary 81  70 - 99 mg/dL  GLUCOSE, CAPILLARY     Status: Abnormal   Collection Time   10/11/11  8:01 PM      Component Value Range   Glucose-Capillary 68 (*) 70 - 99 mg/dL  GLUCOSE, CAPILLARY     Status: Normal   Collection Time   10/12/11 12:12 AM      Component Value Range   Glucose-Capillary 84  70 - 99 mg/dL   Comment 1 Call MD NNP PA CNM    GLUCOSE, CAPILLARY     Status: Abnormal   Collection Time   10/12/11  1:21 AM      Component Value Range   Glucose-Capillary 124 (*) 70 - 99 mg/dL  GLUCOSE, CAPILLARY     Status: Normal   Collection Time   10/12/11  3:54 AM      Component Value Range   Glucose-Capillary 93  70 - 99 mg/dL  BASIC METABOLIC PANEL     Status: Abnormal   Collection Time   10/12/11  4:15 AM      Component Value Range   Sodium 143  135 - 145 mEq/L   Potassium 4.5  3.5 - 5.1 mEq/L   Chloride 108  96 -  112 mEq/L   CO2 29  19 - 32 mEq/L   Glucose, Bld 107 (*) 70 - 99 mg/dL   BUN 27 (*) 6 - 23 mg/dL   Creatinine, Ser 9.60  0.50 - 1.35 mg/dL   Calcium 9.8  8.4 - 45.4 mg/dL   GFR calc non Af Amer >90  >90 mL/min   GFR calc Af Amer >90  >90 mL/min  CBC     Status: Abnormal   Collection Time   10/12/11  4:15 AM      Component Value Range   WBC 16.2 (*) 4.0 - 10.5 K/uL   RBC 3.47 (*) 4.22 - 5.81 MIL/uL   Hemoglobin 10.2 (*) 13.0 - 17.0 g/dL   HCT 09.8 (*) 11.9 - 14.7 %   MCV 92.2  78.0 - 100.0 fL   MCH 29.4  26.0 - 34.0 pg   MCHC 31.9  30.0 - 36.0 g/dL   RDW 82.9  56.2 - 13.0 %   Platelets 363  150 - 400 K/uL    CSF cx neg Resp Cx few staph aureus Blood Cx no growth Urine Cx neg  Dg Chest Port 1 View 10/12/2011 1. Interval placement of tracheostomy, appropriately positioned  without pneumothorax. 2. Low lung volumes. Patchy right greater than left airspace disease which is favored to represent atelectasis. 3. Cardiomegaly without congestive failure. 10/09/2011  Stable support device. 2.  Decreased lung volumes.    10/08/2011 1.  Overall improved aeration with decreased atelectasis at the bases. 2.  Interval placement of a nasogastric tube, which extends beyond the  inferior aspect of the film.     10/07/2011:   Mild cardiac enlargement. 2.  Bibasilar atelectasis. 3.  ET tube tip is stable above the carina.    Neurological Exam : Mental Status: Alert.  Able to follow 3 step commands without difficulty.  Intubated.  Mitt on right hand. Cranial Nerves: II-blinks to confrontation less on left than right. III/IV/VI-mild right gaze preference.  Vertical gaze paralysis.  Pupils reactive bilaterally. V/VII-unable to assess VIII-grossly intact IX/X-unable to assess XI-unable to shrug left shoulder XII-unable to assess Motor: flaccid LUE/LLE, 5/5 RUE/RLE Sensory: does not withdraw to noxious stim on left. Deep Tendon Reflexes: 2+ and symmetric throughout Plantars: upgoing on left, downgoing right Cerebellar: unable to assess  ASSESSMENT Jeremy [name] is a 46 y.o. male with hypertensive right thalamic intracerebral hemorrhage with intraventricular extension with mild hydrocephalus and cytotoxic cerebral edema. Hemorrhage secondary to accelerated hypertension, BP on arrival 164/125.  Neurologically worsening led to IVC placement and intubation. IVC clotted 10/06/2011 and replaced, continues to drain ~111ml/day. Likely VP shunt early next week per NS note. Remains awake and interactive, following commands.   -VDRF. S/p trach by Dr. Jenne Pane 8/26. Remains on the ventilator over night. Weaning on trach collar this am. -low-grade temperature with progressive leukocytosis. Neurosurgeon took sample of CSF. CSF WBCs were 390. RBC 18050, culture was negative. Ucx neg. Blood Cx no growth. On Zosyn (plan stop 8/27 after trach) and Vanc (stopped 8/26) for possible PNA and prophylactic meningitis. -History of medication noncompliance. -accelerated hypertension -dysphagia secondary to stroke. On tube feedings. IR consulted  for PEG placement. -Left hemiparesis  Hospital day # 11  TREATMENT/PLAN -neurosurgery managing IVC. On zosyn. Cx thus far neg. ? VP shunt -PEG this week by IR -Restart therapies when able This patient is critically ill and at significant risk of neurological worsening, death and care requires constant monitoring of vital signs, hemodynamics,respiratory and cardiac monitoring,review  of multiple databases, neurological assessment, discussion with family, other specialists and medical decision making of high complexity. I spent 30 minutes of neurocritical care time  in the care of  this patient.  Annie Main, MSN, RN, ANVP-BC, ANP-BC, GNP-BC [facility] Pager: [phone] 10/12/2011 8:15 AM  Scribe for Dr. Delia Heady, Stroke Center Medical Director, who has personally reviewed chart, pertinent data, examined the patient and developed the plan of care. Pager:  [phone]

## 2011-10-12 NOTE — Progress Notes (Signed)
CBG at MN 64. Hypoglycemic protocol initiated. Will reassess

## 2011-10-12 NOTE — Progress Notes (Signed)
Nutrition Follow-up  Intervention:    Once PEG ready for use will change TF to Jevity 1.2 @ 60 ml/hr with 30 ml Prostat TID. (Will provide: 2028 kcal, 125 grams protein, 1166 ml H2O  Recommend additional free water of 230 ml H2O QID  Assessment:   Patient is currently on trach collar due to hypertensive right thalamic intracerebral hemorrhage with intraventricular extension with mild hydrocephalus and cytotoxic cerebral edema. Pt discussed in ICU rounds this am.  Temp:Temp (24hrs), Avg:98.4 F (36.9 C), Min:98 F (36.7 C), Max:98.6 F (37 C) Propofol off  Pt is NPO with no TF infusing awaiting PEG placement by IR. Pt reports that he is hungry. Per RN awaiting IR PA to assess pt.   Last bm: 10/11/11  Diet Order:  NPO  Meds: Scheduled Meds:    . albuterol-ipratropium  6 puff Inhalation Q6H  . antiseptic oral rinse  15 mL Mouth Rinse Q2H  . atorvastatin  20 mg Per Tube q1800  . chlorhexidine  15 mL Mouth Rinse BID  . dextrose      . feeding supplement  60 mL Per Tube QID  . feeding supplement (PROMOTE)  1,000 mL Per Tube Q24H  . insulin aspart  0-15 Units Subcutaneous Q4H  . multivitamin  5 mL Per Tube Daily  . pantoprazole sodium  40 mg Per Tube Q1200  . senna-docusate  1 tablet Oral BID  . DISCONTD: piperacillin-tazobactam (ZOSYN)  IV  3.375 g Intravenous Q8H   Continuous Infusions:    . sodium chloride 10 mL/hr at 10/12/11 1100  . dextrose 50 mL/hr at 10/12/11 1100  . fentaNYL infusion INTRAVENOUS Stopped (10/12/11 0900)  . propofol Stopped (10/12/11 0900)   PRN Meds:.acetaminophen, dextrose, dextrose, fentaNYL, hydrALAZINE, ondansetron, DISCONTD: 0.9 % irrigation (POUR BTL), DISCONTD: fentaNYL, DISCONTD: lidocaine-EPINEPHrine, DISCONTD: midazolam  Labs:  CMP     Component Value Date/Time   NA 143 10/12/2011 0415   K 4.5 10/12/2011 0415   CL 108 10/12/2011 0415   CO2 29 10/12/2011 0415   GLUCOSE 107* 10/12/2011 0415   BUN 27* 10/12/2011 0415   CREATININE 0.85  10/12/2011 0415   CALCIUM 9.8 10/12/2011 0415   PROT 8.6* 10/02/2011 2206   ALBUMIN 3.5 10/02/2011 2206   AST 24 10/02/2011 2206   ALT 22 10/02/2011 2206   ALKPHOS 96 10/02/2011 2206   BILITOT 0.3 10/02/2011 2206   GFRNONAA >90 10/12/2011 0415   GFRAA >90 10/12/2011 0415   CBG (last 3)   Basename 10/12/11 1121 10/12/11 0800 10/12/11 0354  GLUCAP 94 77 93   Sodium  Date/Time Value Range Status  10/12/2011  4:15 AM 143  135 - 145 mEq/L Final  10/11/2011  3:38 AM 146* 135 - 145 mEq/L Final  10/10/2011  3:35 AM 144  135 - 145 mEq/L Final    Potassium  Date/Time Value Range Status  10/12/2011  4:15 AM 4.5  3.5 - 5.1 mEq/L Final  10/11/2011  3:38 AM 4.4  3.5 - 5.1 mEq/L Final  10/10/2011  3:35 AM 4.0  3.5 - 5.1 mEq/L Final     DELTA CHECK NOTED    Phosphorus  Date/Time Value Range Status  10/02/2011  5:15 AM 2.6  2.3 - 4.6 mg/dL Final  1/61/0960 45:40 AM 1.5* 2.3 - 4.6 mg/dL Final    Magnesium  Date/Time Value Range Status  10/02/2011  5:15 AM 2.0  1.5 - 2.5 mg/dL Final  9/81/1914 78:29 AM 2.1  1.5 - 2.5 mg/dL Final    Intake/Output  Summary (Last 24 hours) at 10/12/11 1144 Last data filed at 10/12/11 1100  Gross per 24 hour  Intake 1874.8 ml  Output   1837 ml  Net   37.8 ml    Weight Status:  272 lbs on admission with no recent wt changes PTA per wife.  272 lbs 8/27 270 lbs 8/25 269 lbs 8/24  Re-estimated needs:  2000-2400 kcal; 115-130 grams protein  Nutrition Dx:  Inadequate oral intake r/t inability to eat AEB NPO status; ongoing.  Goal:  Enteral nutrition to provide 60-70% of estimated calorie needs (22-25 kcals/kg ideal body weight) and >/= 90% of estimated protein needs, based on ASPEN guidelines for permissive underfeeding in critically ill obese individuals; no longer applicable.  NEW GOAL: Pt to meet >/= 90% of their estimated nutrition needs; not yet met.  Monitor:  weight, TF tolerance, labs   Kendell Bane RD, LDN, CNSC [phone] Pager [phone] After Hours  Pager

## 2011-10-12 NOTE — H&P (Signed)
Cc: patient with recent CVA secondary to malignant hypertension. Currently with trach in need of access for long term feeds.   See note below for HPI :   PULMONARY / CRITICAL CARE MEDICINE  HPI:  46 yo AAM with PMH of HTN presented to [facility] ED on 8/16 via EMS as a code stroke.  He woke up at 7 am and was unable to ambulate to bathroom, use left hand and was incontinent of urine.  He apparently woke several times during the night to urinate (last at 4am).  He noted right sided headache and vomited in the ambulance.  CT eval demonstrated R basal banglia hemorrhage with minimal surrounding edema.  PCCM consulted for ICU mgmt.    Past Medical History  Diagnosis Date  . Hypertension   . Stroke    History reviewed. No pertinent past surgical history. Prior to Admission medications   Not on File   Allergies No Known Allergies  Social History  reports that he has never smoked. He does not have any smokeless tobacco history on file. He reports that he does not drink alcohol or use illicit drugs. Married, Januel Doolan is his spouse.   Brief patient description:  20 y/o AAM with HTN admitted with R basal ganglia hemorrhage.   Events Since Admission: 8/16 Admit with basal ganglia hemorrhage  Vital Signs: Temp:  [98 F (36.7 C)-98.6 F (37 C)] 98.6 F (37 C) (08/27 0800) Pulse Rate:  [77-102] 90  (08/27 1200) Resp:  [7-27] 20  (08/27 1200) BP: (107-164)/(66-108) 158/89 mmHg (08/27 1200) SpO2:  [80 %-100 %] 100 % (08/27 1200) FiO2 (%):  [30 %-35 %] 35 % (08/27 1200) Weight:  [272 lb 11.3 oz (123.7 kg)] 272 lb 11.3 oz (123.7 kg) (08/27 0500)  Physical Examination: General:  Chronically ill in NAD Neuro:  AAOx4, no movement on L side, R with spontaneous movt, unilateral ptosis on R HEENT:  Mm pink/moist Cardiovascular:  s1s2 rrr, no m/r/g Lungs:  resp's even/non-labored, lungs bilaterally coarse Abdomen:  Round/soft, bsx4 active Musculoskeletal:  No acute deformities Skin:   Warm/dry  Principal Problem:  *Malignant hypertension requiring acute intensive management Active Problems:  ICH (intracerebral hemorrhage)  HTN (hypertension)  Hypoxemia  Altered mental status  Cytotoxic cerebral edema  Obstructive hydrocephalus  Acute respiratory failure with hypoxia  Elevated intracranial pressure  Ventilator Settings: Vent Mode:  [-] PRVC FiO2 (%):  [30 %-35 %] 35 % Set Rate:  [16 bmp] 16 bmp Vt Set:  [580 mL] 580 mL PEEP:  [5 cmH20] 5 cmH20 Plateau Pressure:  [21 cmH20-24 cmH20] 24 cmH20  CXR:  8/16 no acute disease  Medications : none prior to admission.   Current facility-administered medications:0.9 %  sodium chloride infusion, , Intravenous, Continuous, Storm Frisk, MD, Last Rate: 10 mL/hr at 10/12/11 1200;  acetaminophen (TYLENOL) tablet 650 mg, 650 mg, Oral, Q6H PRN, Lupita Leash, MD, 650 mg at 10/04/11 1352;  albuterol-ipratropium (COMBIVENT) inhaler 6 puff, 6 puff, Inhalation, Q6H, Provider Default, MD, 6 puff at 10/12/11 0809 antiseptic oral rinse (BIOTENE) solution 15 mL, 15 mL, Mouth Rinse, Q2H, Storm Frisk, MD, 15 mL at 10/12/11 1200;  atorvastatin (LIPITOR) tablet 20 mg, 20 mg, Per Tube, q1800, Storm Frisk, MD, 20 mg at 10/10/11 1804;  ceFAZolin (ANCEF) IVPB 2 g/50 mL premix, 2 g, Intravenous, Once, Axl T. Shick, MD;  chlorhexidine (PERIDEX) 0.12 % solution 15 mL, 15 mL, Mouth Rinse, BID, Storm Frisk, MD, 15 mL at 10/12/11 [id number]  dextrose 5 % solution, , Intravenous, Continuous, Roxine Caddy, MD, Last Rate: 50 mL/hr at 10/12/11 1200;  dextrose 50 % solution 25 mL, 25 mL, Intravenous, Once PRN, Alyson Reedy, MD, 25 mL at 10/12/11 0059;  dextrose 50 % solution 50 mL, 50 mL, Intravenous, Once PRN, Alyson Reedy, MD;  dextrose 50 % solution, , , , ;  feeding supplement (PRO-STAT SUGAR FREE 64) liquid 60 mL, 60 mL, Per Tube, QID, Storm Frisk, MD, 60 mL at 10/10/11 2000 feeding supplement (PROMOTE) liquid 1,000 mL, 1,000 mL,  Per Tube, Q24H, Heather Cornelison Pitts, RD, 1,000 mL at 10/09/11 1640;  fentaNYL (SUBLIMAZE) 10 mcg/mL in sodium chloride 0.9 % 250 mL infusion, 50-400 mcg/hr, Intravenous, Titrated, Storm Frisk, MD, 50 mcg/hr at 10/12/11 0800;  fentaNYL (SUBLIMAZE) bolus via infusion 50-100 mcg, 50-100 mcg, Intravenous, Q6H PRN, Storm Frisk, MD hydrALAZINE (APRESOLINE) injection 10 mg, 10 mg, Intravenous, Q4H PRN, Bernadene Person, NP, 10 mg at 10/05/11 0212;  insulin aspart (novoLOG) injection 0-15 Units, 0-15 Units, Subcutaneous, Q4H, Lupita Leash, MD, 2 Units at 10/11/11 0350;  multivitamin liquid 5 mL, 5 mL, Per Tube, Daily, Heather Cornelison Pitts, RD, 5 mL at 10/10/11 1100 ondansetron (ZOFRAN) injection 4 mg, 4 mg, Intravenous, Q6H PRN, Micki Riley, MD, 4 mg at 10/02/11 2100;  pantoprazole sodium (PROTONIX) 40 mg/20 mL oral suspension 40 mg, 40 mg, Per Tube, Q1200, Storm Frisk, MD, 40 mg at 10/10/11 1129;  propofol (DIPRIVAN) 10 mg/ml infusion, 5-50 mcg/kg/min, Intravenous, Titrated, Storm Frisk, MD, 20 mcg/kg/min at 10/12/11 0800 senna-docusate (Senokot-S) tablet 1 tablet, 1 tablet, Oral, BID, Minus Breeding, MD, 1 tablet at 10/10/11 2139;  DISCONTD: 0.9 % irrigation (POUR BTL), , , PRN, Christia Reading, MD, 1,000 mL at 10/11/11 1318;  DISCONTD: fentaNYL (SUBLIMAZE) injection 50-100 mcg, 50-100 mcg, Intravenous, PRN, Germaine Pomfret, MD;  DISCONTD: lidocaine-EPINEPHrine (XYLOCAINE W/EPI) 1 %-1:100000 (with pres) injection, , , PRN, Christia Reading, MD, 3 mL at 10/11/11 1318 DISCONTD: midazolam (VERSED) injection 1-2 mg, 1-2 mg, Intravenous, PRN, Germaine Pomfret, MD;  DISCONTD: piperacillin-tazobactam (ZOSYN) IVPB 3.375 g, 3.375 g, Intravenous, Q8H, Crystal Stillinger Robertson, PHARMD, 3.375 g at 10/12/11 0255 Facility-Administered Medications Ordered in Other Encounters: DISCONTD: fentaNYL (SUBLIMAZE) injection, , , PRN, Kristopher Key, CRNA, 50 mcg at 10/11/11 1314;  DISCONTD:  lactated ringers infusion, , , Continuous PRN, Kristopher Key, CRNA;  DISCONTD: phenylephrine (NEO-SYNEPHRINE) injection, , , PRN, Kristopher Key, CRNA, 80 mcg at 10/11/11 1316;  DISCONTD: rocuronium (ZEMURON) injection, , , PRN, Kristopher Key, CRNA, 50 mg at 10/11/11 1326 DISCONTD: vecuronium (NORCURON) injection, , , PRN, Kristopher Key, CRNA, 10 mg at 10/11/11 1258    Lab 10/12/11 0415 10/11/11 0338 10/10/11 0335 10/09/11 0300 10/08/11 0315  HGB 10.2* 10.5* 11.0* 11.3* 11.7*  HCT 32.0* 33.4* 34.6* 34.3* 35.6*  PLT 363 348 333 308 302  INR -- -- -- -- --  APTT -- -- -- -- --    INFECTIOUS  Lab 10/12/11 0415 10/11/11 0338 10/10/11 0335 10/09/11 0300 10/08/11 0315  WBC 16.2* 13.1* 13.2* 17.6* 21.0*  PROCALCITON -- -- -- -- --     10/12/2011, 12:43 PM   Discussed with patient's spouse in detail procedure for gastric tube placement. Patient is now status post trach placement and in need of nutrition.   Benefits and potential complications including but not limited to infection, bleeding, organ damage and complications with moderate sedation reviewed. Written consent obtained and witness by RN. Plan for  patient to receive barium tonight via OG tube and placement of percutaneous gastric tube in am in IR if labs in am are WNL to proceed.

## 2011-10-12 NOTE — Progress Notes (Signed)
Overall stable. No new issues.  He is afebrile. Ventriculostomy drainage has diminished considerably after patient has been taken off mechanical ventilation. Neurologically he is awake and aware. He follows commands on the right. Still very paretic on the left.  Will begin trying to wean the ventriculostomy. Hopefully we can progress to clamping the drain over the next couple of days and shoot for withdrawal toward the end of the week.

## 2011-10-12 NOTE — Progress Notes (Signed)
Name: Jeremy [name] MRN: [id number] DOB: 12-Jul-1965    LOS: 11  Referring Provider:  Neuro Reason for Referral:  CVA    PULMONARY / CRITICAL CARE MEDICINE  HPI:  46 yo AAM with PMH of HTN presented to [facility] ED on 8/16 via EMS as a code stroke.  He woke up at 7 am and was unable to ambulate to bathroom, use left hand and was incontinent of urine.  He apparently woke several times during the night to urinate (last at 4am).  He noted right sided headache and vomited in the ambulance.  CT eval demonstrated R basal banglia hemorrhage with minimal surrounding edema.  PCCM consulted for ICU mgmt.    Brief patient description:  46 y/o AAM with HTN admitted with R basal ganglia hemorrhage.   Events Since Admission: 8/16 Admit with basal ganglia hemorrhage 8/20 Intubated for airway protection and elevated ICP with IVC drain placement 8/21 IVC drain replaced d/t prior drain malfunction  Subjective:  S/p trach, currently on TC Hungry, wants to eat  Vital Signs: Temp:  [98 F (36.7 C)-98.6 F (37 C)] 98.6 F (37 C) (08/27 0800) Pulse Rate:  [77-102] 96  (08/27 0900) Resp:  [7-27] 27  (08/27 0900) BP: (107-164)/(66-108) 146/78 mmHg (08/27 0900) SpO2:  [80 %-100 %] 98 % (08/27 0900) FiO2 (%):  [30 %] 30 % (08/27 0811) Weight:  [123.7 kg (272 lb 11.3 oz)] 123.7 kg (272 lb 11.3 oz) (08/27 0500)  Intake/Output Summary (Last 24 hours) at 10/12/11 0936 Last data filed at 10/12/11 0800  Gross per 24 hour  Intake 1800.2 ml  Output   1827 ml  Net  -26.8 ml    Physical Examination: Gen: ill appearing, awake HEENT: NCAT, PERRL, EOMi, trach in place PULM: scattered rhonchi CV: RRR, no mgr, no JVD AB: BS+, soft, nontender, no hsm Ext: warm, no edema, no clubbing, no cyanosis Derm: no rash or skin breakdown Neuro: awake, interacting  Principal Problem:  *Malignant hypertension requiring acute intensive management Active Problems:  ICH (intracerebral hemorrhage)  HTN (hypertension)  Hypoxemia  Altered mental status  Cytotoxic cerebral edema  Obstructive hydrocephalus  Acute respiratory failure with hypoxia  Elevated intracranial pressure   ASSESSMENT AND PLAN  NEUROLOGIC CPP 71  ICP 9 8/25 8/20  CT Head >> R thalamic hematoma slightly enlarged with surrounding edema; 2mm midline shift, no change to sl smaller volume of blood  CNS DRAINS: IVC drain 8/20>>8/21 replaced >>  A:   Acute R Basal Ganglia Hemorrhage -hemorrhage with small amt edema Acute resp failure d/t Intraventric blood and hydroceph improve with IVC  ELEVATED ICP with IVC drain improved with drainage. Increased output with drain P:   Cont IVC drain, note CSF cultures negative Per neurosurgery >> may require VP shunt  Monitor ICP   PULMONARY No results found for this basename: PHART:5,PCO2:5,PCO2ART:5,PO2ART:5,HCO3:5,O2SAT:5 in the last 168 hours Ventilator Settings: Vent Mode:  [-] PRVC FiO2 (%):  [30 %] 30 % Set Rate:  [16 bmp] 16 bmp Vt Set:  [580 mL] 580 mL PEEP:  [5 cmH20] 5 cmH20 Plateau Pressure:  [21 cmH20-24 cmH20] 24 cmH20 CXR:  none ETT:  8/20>>8/22>>reintubated 8/22>>8/26 Trach (Dr Jenne Pane) 8/26 >>   A:   Acute respiratory failure d/t hydroceph d/t intraventric blood with thalamic bleed P:   -TC as tolerated -speech rx to eval for possible cuff down, PMV  CARDIOVASCULAR  Lab 10/07/11 0500 10/06/11 2130 10/06/11 0907  TROPONINI <0.30 <0.30 <0.30  LATICACIDVEN -- -- --  PROBNP -- -- --   ECG:  None Lines: L IJ CVL 8/20>>> 8/16 TTE >> limited study, LVEF 60-70%  A: HTN, neg cardiac enzymes,  P:  Prn BP meds ordered, no scheduled meds at this time; will start once enteral access   RENAL  Lab 10/12/11 0415 10/11/11 0338 10/10/11 0335 10/09/11 0300 10/08/11 0315  NA 143 146* 144 140 135  K 4.5 4.4 -- -- --  CL 108 109 107 101 96  CO2 29 30 30 31 30   BUN 27* 37* 39* 43* 48*  CREATININE 0.85 0.88 0.82 0.89 0.99  CALCIUM 9.8 9.7 9.6 9.6 9.8  MG -- -- -- -- --    PHOS -- -- -- -- --    Intake/Output Summary (Last 24 hours) at 10/12/11 0936 Last data filed at 10/12/11 0800  Gross per 24 hour  Intake 1800.2 ml  Output   1827 ml  Net  -26.8 ml    Foley:  8/16>>>out 8/21>>8/23>>>  A:   Hyponatremia better with IVC  Was prob d/t cerebral salt wasting with hydroceph Hypokalemia  persists P:   Follow bmp  GASTROINTESTINAL No results found for this basename: AST:5,ALT:5,ALKPHOS:5,BILITOT:5,PROT:5,ALBUMIN:5 in the last 168 hours  A:   No active GI issues P:   Cont TF Plan for PEG placement, IR consulted  HEMATOLOGIC  Lab 10/12/11 0415 10/11/11 0338 10/10/11 0335 10/09/11 0300 10/08/11 0315  HGB 10.2* 10.5* 11.0* 11.3* 11.7*  HCT 32.0* 33.4* 34.6* 34.3* 35.6*  PLT 363 348 333 308 302  INR -- -- -- -- --  APTT -- -- -- -- --   A:   No acute issues P:  -monitor cbc -ok on LMWH DVT proph   INFECTIOUS  Lab 10/12/11 0415 10/11/11 0338 10/10/11 0335 10/09/11 0300 10/08/11 0315  WBC 16.2* 13.1* 13.2* 17.6* 21.0*  PROCALCITON -- -- -- -- --   Cultures: 8/16 UC>>neg 8/23 CSF>>> negative 8/23 BCx2>>>neg 8/23 Resp c/s>>> rare S aureus  Antibiotics: Zosyn 8/23 (?csf infx)>> vanco 8/23 (? Csf )>> 8/26 A:   Fever, ? CSF vs pulm source, CXR with low lung volumes P:   -all cx negative except rare staph in sputum, d/c'd vanco 8/26 and then d/c zosyn on 8/27, follow fever, CBC   ENDOCRINE  Lab 10/12/11 0800 10/12/11 0354 10/12/11 0121 10/12/11 0012 10/11/11 2001  GLUCAP 77 93 124* 84 68*   A:   Hyperglycemia  P:   -SSI CBG for tighter control with CVA   BEST PRACTICE / DISPOSITION Level of Care:  ICU Primary Service:  PCCM Consultants:  Neurology, ENT Jenne Pane Code Status:  Full Diet:  TF DVT Px:  LMWH >> to SCD until after PEG GI Px:  ppi Skin Integrity:  intact Social / Family: spoke w family at 8/26  CC  Levy Pupa, MD, PhD 10/12/2011, 9:36 AM [facility] [phone] or if no  answer [phone]

## 2011-10-12 NOTE — Progress Notes (Signed)
cbg recheck 124

## 2011-10-12 NOTE — Progress Notes (Signed)
Arrived to complete PMSV eval, pt receiving nursing care prior to placement of NG tube for barium placement. Rn states pt will have PEG tube placed tomorrow. Will defer PMSV eval for now. Of note, prior to extension of CVA and intubation pt was demonstrating normal swallow function. Question if pt may benefit from deferral of PEG with short term nutrition until swallow can be evaluated. Will discuss with Dr. Delton Coombes. Harlon Ditty, MA CCC-SLP [phone]

## 2011-10-12 NOTE — Progress Notes (Signed)
Pt started vomiting, was placed on his right side, mouth was suctioned.  He was not complaining of nausea after this episode.    He also removed the dressing off his IVC drain site but did not pull the catheter out, sutures still in place. Skin around site was prepped with betodine and gauze with tegaderm dressing was applied.

## 2011-10-13 ENCOUNTER — Inpatient Hospital Stay (HOSPITAL_COMMUNITY): Payer: BC Managed Care – PPO

## 2011-10-13 LAB — BASIC METABOLIC PANEL
BUN: 23 mg/dL (ref 6–23)
CO2: 26 mEq/L (ref 19–32)
Calcium: 9.8 mg/dL (ref 8.4–10.5)
Chloride: 107 mEq/L (ref 96–112)
Creatinine, Ser: 0.73 mg/dL (ref 0.50–1.35)
GFR calc Af Amer: >90 mL/min (ref >90)
GFR calc non Af Amer: >90 mL/min (ref >90)
Glucose, Bld: 104 mg/dL — ABNORMAL HIGH (ref 70–99)
Potassium: 3.6 mEq/L (ref 3.5–5.1)
Sodium: 140 mEq/L (ref 135–145)

## 2011-10-13 LAB — CBC
HCT: 31 % — ABNORMAL LOW (ref 39.0–52.0)
Hemoglobin: 9.7 g/dL — ABNORMAL LOW (ref 13.0–17.0)
MCH: 28.8 pg (ref 26.0–34.0)
MCHC: 31.3 g/dL (ref 30.0–36.0)
MCV: 92 fL (ref 78.0–100.0)
Platelets: 382 10*3/uL (ref 150–400)
RBC: 3.37 MIL/uL — ABNORMAL LOW (ref 4.22–5.81)
RDW: 13.7 % (ref 11.5–15.5)
WBC: 15.3 10*3/uL — ABNORMAL HIGH (ref 4.0–10.5)

## 2011-10-13 LAB — PROTIME-INR
INR: 1.22 (ref 0.00–1.49)
Prothrombin Time: 15.7 seconds — ABNORMAL HIGH (ref 11.6–15.2)

## 2011-10-13 LAB — GLUCOSE, CAPILLARY
Glucose-Capillary: 112 mg/dL — ABNORMAL HIGH (ref 70–99)
Glucose-Capillary: 91 mg/dL (ref 70–99)
Glucose-Capillary: 91 mg/dL (ref 70–99)
Glucose-Capillary: 99 mg/dL (ref 70–99)

## 2011-10-13 MED ORDER — ALBUTEROL SULFATE HFA 108 (90 BASE) MCG/ACT IN AERS: 6.0000 | INHALATION_SPRAY | RESPIRATORY_TRACT | Status: DC | PRN

## 2011-10-13 MED ORDER — MIDAZOLAM HCL 5 MG/5ML IJ SOLN
INTRAMUSCULAR | Status: AC | PRN
  Administered 2011-10-13 (×2): 1 mg via INTRAVENOUS

## 2011-10-13 MED ORDER — FENTANYL CITRATE 0.05 MG/ML IJ SOLN
INTRAMUSCULAR | Status: AC | PRN
  Administered 2011-10-13 (×2): 50 ug via INTRAVENOUS

## 2011-10-13 MED ORDER — BACLOFEN 1 MG/ML ORAL SUSPENSION
10.0000 mg | Freq: Two times a day (BID) | ORAL | Status: DC
  Administered 2011-10-14 – 2011-10-19 (×11): 10 mg
  Filled 2011-10-13 (×16): qty 1

## 2011-10-13 NOTE — Evaluation (Signed)
Clinical/Bedside Swallow Evaluation Patient Details  Name: Jeremy [name] MRN: [id number] Date of Birth: 1965-09-08  Today's Date: 10/13/2011 Time: 0830-0900 SLP Time Calculation (min): 30 min  Past Medical History:  Past Medical History  Diagnosis Date  . Hypertension   . Stroke    Past Surgical History:  Past Surgical History  Procedure Date  . Tracheostomy tube placement 10/11/2011    Procedure: TRACHEOSTOMY;  Surgeon: Christia Reading, MD;  Location: [facility] OR;  Service: ENT;  Laterality: N/A;   HPI:  46 y/o AAM with HTN admitted with R basal ganglia hemorrhage with extension to the right thalamus and posterior third ventricle. Right frontal ventriculostomy placed 8/20 due to obstructive hydrocephalus. Pt intubated from 8/20 to 8/27. Tracheostomy on 10/12/11. Prior to extension, SLP evaluated swallow at bedside and recommended a regualr diet with thin liquids.    Assessment / Plan / Recommendation Clinical Impression  Pt accepted minimal trials of ice chips and thin liquids after oral care with timely oral transit and swallow response with no cough response or multiple swallows. However, pt was only intermittently able to tolerate PMSV, was not able to attain phonation, is 2 days post a 7 day intubation trial and shows poor breath support for volitional cough. Risk of silent aspiration is very high. Pt is scheduled for possible PEG placement for later this am and MD present to determine wheter to proceed or not. Despite pts demonstration of oral and pharyngeal function, It was determined that given intolerance of PMSV and probable need for short term nutrition for several days prior to safe oral intake (with pts poor tolerance of tubes etc due to cognition) and continued need for pm vent, pt should proceed with PEG and progress as able to PO intake.     Aspiration Risk  Severe    Diet Recommendation NPO;Alternative means - long-term        Other  Recommendations Recommended Consults: FEES  (when progress) Oral Care Recommendations: Oral care QID Other Recommendations: Place PMSV during PO intake   Follow Up Recommendations  Inpatient Rehab    Frequency and Duration min 3x week  2 weeks   Pertinent Vitals/Pain NA    SLP Swallow Goals Goal #3: Pt will consume trials of ice chips/water after oral care with PMSV in place to determine readiness for FEES or PO intake.    Swallow Study Prior Functional Status       General HPI: 46 y/o AAM with HTN admitted with R basal ganglia hemorrhage with extension to the right thalamus and posterior third ventricle. Right frontal ventriculostomy placed 8/20 due to obstructive hydrocephalus. Pt intubated from 8/20 to 8/27. Tracheostomy on 10/12/11. Prior to extension, SLP evaluated swallow at bedside and recommended a regualr diet with thin liquids.  Type of Study: Bedside swallow evaluation Previous Swallow Assessment: BSE 10/02/11. After several attempts, pt recommended to initiate a regular diet with thin liquids.  Diet Prior to this Study: NPO Respiratory Status: Trach Trach Size and Type: #8;Cuff;Deflated;With PMSV in place History of Recent Intubation: Yes Length of Intubations (days): 7 days Date extubated: 10/12/11 Behavior/Cognition: Alert;Cooperative;Requires cueing;Confused Oral Cavity - Dentition: Missing dentition Self-Feeding Abilities: Total assist Patient Positioning: Upright in bed Baseline Vocal Quality: Aphonic Volitional Cough: Weak Volitional Swallow: Able to elicit    Oral/Motor/Sensory Function Overall Oral Motor/Sensory Function: Impaired Labial ROM: Within Functional Limits Labial Symmetry: Within Functional Limits Labial Strength: Within Functional Limits Labial Sensation: Within Functional Limits Lingual ROM: Within Functional Limits Lingual Symmetry: Within Functional Limits Lingual  Strength: Within Functional Limits Lingual Sensation: Within Functional Limits Facial ROM: Within Functional  Limits Facial Symmetry: Within Functional Limits Facial Strength: Within Functional Limits Facial Sensation: Within Functional Limits Velum: Impaired left Mandible: Within Functional Limits   Ice Chips Ice chips: Within functional limits Presentation: Spoon   Thin Liquid Thin Liquid: Within functional limits Presentation: Cup    Nectar Thick     Honey Thick     Puree Puree: Not tested   Solid   GO    Solid: Not tested       Claudine Mouton 10/13/2011,11:24 AM

## 2011-10-13 NOTE — Progress Notes (Signed)
Name: Jeremy [name] MRN: [id number] DOB: 04/28/1965    LOS: 12  Referring Provider:  Neuro Reason for Referral:  CVA   PULMONARY / CRITICAL CARE MEDICINE  HPI:  46 yo AAM with PMH of HTN presented to [facility] ED on 8/16 via EMS as a code stroke.  He woke up at 7 am and was unable to ambulate to bathroom, use left hand and was incontinent of urine.  He apparently woke several times during the night to urinate (last at 4am).  He noted right sided headache and vomited in the ambulance.  CT eval demonstrated R basal banglia hemorrhage with minimal surrounding edema.  PCCM consulted for ICU mgmt.    Brief patient description:  46 y/o AAM with HTN admitted with R basal ganglia hemorrhage.   Events Since Admission: 8/16 Admit with basal ganglia hemorrhage 8/20 Intubated for airway protection and elevated ICP with IVC drain placement 8/21 IVC drain replaced d/t prior drain malfunction  Subjective:  Still with active draining into ventric despite raising on 8/27 Barium given for possible IR-PEG today, Swallow eval also pending today.   Vital Signs: Temp:  [98.6 F (37 C)-99.7 F (37.6 C)] 98.6 F (37 C) (08/28 0400) Pulse Rate:  [66-109] 66  (08/28 0809) Resp:  [16-30] 21  (08/28 0809) BP: (116-174)/(73-104) 127/78 mmHg (08/28 0809) SpO2:  [97 %-100 %] 100 % (08/28 0809) FiO2 (%):  [30 %-35 %] 35 % (08/28 0809) Weight:  [122.3 kg (269 lb 10 oz)] 122.3 kg (269 lb 10 oz) (08/28 0400)  Intake/Output Summary (Last 24 hours) at 10/13/11 0841 Last data filed at 10/13/11 0800  Gross per 24 hour  Intake 1780.18 ml  Output    886 ml  Net 894.18 ml    Physical Examination: Gen: ill appearing, awake HEENT: NCAT, PERRL, EOMi, trach in place PULM: scattered rhonchi CV: RRR, no mgr, no JVD AB: BS+, soft, nontender, no hsm Ext: warm, no edema, no clubbing, no cyanosis Derm: no rash or skin breakdown Neuro: awake, interacting  Principal Problem:  *Malignant hypertension requiring acute  intensive management Active Problems:  ICH (intracerebral hemorrhage)  HTN (hypertension)  Hypoxemia  Altered mental status  Cytotoxic cerebral edema  Obstructive hydrocephalus  Acute respiratory failure with hypoxia  Elevated intracranial pressure   ASSESSMENT AND PLAN  NEUROLOGIC CPP 71  ICP 9 8/25 8/20  CT Head >> R thalamic hematoma slightly enlarged with surrounding edema; 2mm midline shift, no change to sl smaller volume of blood  CNS DRAINS: IVC drain 8/20>>8/21 replaced >>  A:   Acute R Basal Ganglia Hemorrhage -hemorrhage with small amt edema Acute resp failure d/t Intraventric blood and hydroceph improve with IVC  ELEVATED ICP with IVC drain improved with drainage. Increased output with drain P:   Cont IVC drain, note CSF cultures negative Per neurosurgery >> monitoring ICP's will change to qd; may require VP shunt as still significant drainage even after raising drain to gravity   PULMONARY No results found for this basename: PHART:5,PCO2:5,PCO2ART:5,PO2ART:5,HCO3:5,O2SAT:5 in the last 168 hours Ventilator Settings: Vent Mode:  [-] PRVC FiO2 (%):  [30 %-35 %] 35 % Set Rate:  [16 bmp] 16 bmp Vt Set:  [580 mL] 580 mL PEEP:  [5 cmH20] 5 cmH20 Plateau Pressure:  [25 cmH20-27 cmH20] 27 cmH20 CXR:  none ETT:  8/20>>8/22>>reintubated 8/22>>8/26 Trach (Dr Jenne Pane) 8/26 >>   A:   Acute respiratory failure d/t hydroceph d/t intraventric blood with thalamic bleed P:   -TC as tolerated -speech rx  to eval for possible cuff down, PMV  CARDIOVASCULAR  Lab 10/07/11 0500 10/06/11 2130 10/06/11 0907  TROPONINI <0.30 <0.30 <0.30  LATICACIDVEN -- -- --  PROBNP -- -- --   ECG:  None Lines: L IJ CVL 8/20>>> 8/16 TTE >> limited study, LVEF 60-70%  A: HTN, neg cardiac enzymes,  P:  Prn BP meds ordered, no scheduled meds at this time; will start once enteral access   RENAL  Lab 10/13/11 0430 10/12/11 0415 10/11/11 0338 10/10/11 0335 10/09/11 0300  NA 140 143 146*  144 140  K 3.6 4.5 -- -- --  CL 107 108 109 107 101  CO2 26 29 30 30 31   BUN 23 27* 37* 39* 43*  CREATININE 0.73 0.85 0.88 0.82 0.89  CALCIUM 9.8 9.8 9.7 9.6 9.6  MG -- -- -- -- --  PHOS -- -- -- -- --    Intake/Output Summary (Last 24 hours) at 10/13/11 0841 Last data filed at 10/13/11 0800  Gross per 24 hour  Intake 1780.18 ml  Output    886 ml  Net 894.18 ml    Foley:  8/16>>>out 8/21>>8/23>>>  A:   Hyponatremia better with IVC  Was prob d/t cerebral salt wasting with hydroceph Hypokalemia  persists P:   Follow bmp  GASTROINTESTINAL No results found for this basename: AST:5,ALT:5,ALKPHOS:5,BILITOT:5,PROT:5,ALBUMIN:5 in the last 168 hours  A:   No active GI issues P:   Cont TF Plan for PEG placement if patient can't pass the swallowing evaluation this am 8/28  HEMATOLOGIC  Lab 10/13/11 0430 10/12/11 0415 10/11/11 0338 10/10/11 0335 10/09/11 0300  HGB 9.7* 10.2* 10.5* 11.0* 11.3*  HCT 31.0* 32.0* 33.4* 34.6* 34.3*  PLT 382 363 348 333 308  INR 1.22 -- -- -- --  APTT -- -- -- -- --   A:   No acute issues P:  -monitor cbc -ok on LMWH DVT proph   INFECTIOUS  Lab 10/13/11 0430 10/12/11 0415 10/11/11 0338 10/10/11 0335 10/09/11 0300  WBC 15.3* 16.2* 13.1* 13.2* 17.6*  PROCALCITON -- -- -- -- --   Cultures: 8/16 UC>>neg 8/23 CSF>>> negative 8/23 BCx2>>>neg 8/23 Resp c/s>>> rare S aureus  Antibiotics: Zosyn 8/23 (?csf infx)>> 8/27 vanco 8/23 (? Csf )>> 8/26 A:   Fever, ? CSF vs pulm source, CXR with low lung volumes P:   -all cx negative except rare staph in sputum, d/c'd vanco 8/26 and then d/c zosyn on 8/27, follow fever, CBC   ENDOCRINE  Lab 10/13/11 0355 10/13/11 0003 10/12/11 2043 10/12/11 1555 10/12/11 1121  GLUCAP 91 112* 87 94 94   A:   Hyperglycemia  P:   -SSI   BEST PRACTICE / DISPOSITION Level of Care:  ICU Primary Service:  PCCM Consultants:  Neurology, ENT Jenne Pane Code Status:  Full Diet:  TF DVT Px:  LMWH >> to SCD until  after PEG GI Px:  ppi Skin Integrity:  intact Social / Family:    Levy Pupa, MD, PhD 10/13/2011, 8:41 AM [facility] [phone] or if no answer [phone]

## 2011-10-13 NOTE — Procedures (Signed)
Successful fluoroscopic guided insertion of gastrostomy tube without immediate post procedural complicatoin.   The gastrostomy tube may be used immediately for medications.  Otherwise, place gastrostomy tube to low wall suction for 24 hrs.  Tube feeds may be initiated in 24 hours as per the primary team.   

## 2011-10-13 NOTE — Progress Notes (Signed)
Patient had 2 episodes of vomiting while flat in CT scanner.  Patient turned on side and suction by yaunker applied to patient.  Sats during both episodes remained > 96%.  Emesis was yellow tinged and estimated at 250 cc.  Dr. Delton Coombes notified of event.  Will continue to monitor patient.

## 2011-10-13 NOTE — Evaluation (Signed)
Passy-Muir Speaking Valve - Evaluation Patient Details  Name: Jeremy [name] MRN: [id number] Date of Birth: Jun 16, 1965  Today's Date: 10/13/2011 Time: 0830-0900 SLP Time Calculation (min): 30 min  Past Medical History:  Past Medical History  Diagnosis Date  . Hypertension   . Stroke    Past Surgical History:  Past Surgical History  Procedure Date  . Tracheostomy tube placement 10/11/2011    Procedure: TRACHEOSTOMY;  Surgeon: Christia Reading, MD;  Location: [facility] OR;  Service: ENT;  Laterality: N/A;   HPI:  46 y/o AAM with HTN admitted with R basal ganglia hemorrhage with extension to the right thalamus and posterior third ventricle. Right frontal ventriculostomy placed 8/20 due to obstructive hydrocephalus. Pt intubated from 8/20 to 8/27. Tracheostomy on 10/12/11. Prior to extension, SLP evaluated swallow at bedside and recommended a regualr diet with thin liquids.    Assessment / Plan / Recommendation Clinical Impression  Pt unable to tolerate placement of PMSV for longer than 2 minutes due to poor redirection of air to upper airway, likely secondary to size 8 cuffed Shiley. Breath support for phonation/cough is poor. Pt did demonstrate minimal redirection of air with visual/tactile evidence, blowing on napkin. Possible minimal adduction of vocal folds observed with voluntary cough. Pt unable to produce any intelligible speech. Recommend pt keep cuff deflated during waking hours, (MD agrees) PMSV trials with SLP only. Downsize trach when medically ready (likely not for several days per MD). Pt still requiring vent support in PM.     SLP Assessment  Patient needs continued Speech Lanaguage Pathology Services    Follow Up Recommendations  Inpatient Rehab    Frequency and Duration min 3x week  2 weeks   Pertinent Vitals/Pain NA    SLP Goals Potential to Achieve Goals: Good Progress/Goals/Alternative treatment plan discussed with pt/caregiver and they: Agree SLP Goal #1: Pt will  tolerate placement of PMSV for 30 minutes without change in vital signs or overt CO2 trapping with moderate assist from SLP.  SLP Goal #2: Pt will verbalize at conversation level with adequate breath support with moderate verbal cues from SLP SLP Goal #3: Pt will orally expectorate secretions with moderate verbal cues from SLP.    PMSV Trial  PMSV was placed for: 1-2 minute intervals Able to redirect subglottic air through upper airway: Yes Able to Attain Phonation: No Able to Expectorate Secretions: No Level of Secretion Expectoration with PMSV: Tracheal Breath Support for Phonation: Moderately decreased Intelligibility: Intelligibility reduced Word: 0-24% accurate Phrase: 0-24% accurate Sentence: 0-24% accurate Conversation: 0-24% accurate Respirations During Trial: 25  SpO2 During Trial: 100 % Behavior: Alert   Tracheostomy Tube  Additional Tracheostomy Tube Assessment Trach Collar Period: During waking hours since 8/27 Secretion Description: thin, yellow Frequency of Tracheal Suctioning: 1-3 times a day Level of Secretion Expectoration: Tracheal    Vent Dependency  Vent Dependent: Yes FiO2 (%): 35 % Nocturnal Vent: Yes    Cuff Deflation Trial Tolerated Cuff Deflation: Yes Length of Time for Cuff Deflation Trial: 30 Behavior: Alert Cuff Deflation Trial - Comments: No significant change, no coughing    Reeta Kuk, [name] 10/13/2011, 11:05 AM

## 2011-10-13 NOTE — Progress Notes (Signed)
Stroke Team Progress Note  HISTORY [name] is a 46 y.o. male  who had been c/o of headaches for few days, with nausea and vomiting,  On 10/01/11 morning according to his wife he had visual disturbance and then he vomited followed by left sided weakness approximately 7 AM. Patient arrived to the ED. Head CT was done that showed, Right BG, bleed with some area involved in thalamus. With minimal mass effect.   According to the wife he has stopped taking his BP medications several years ago and does not go the physician as suggested. Previously, he had significant neurological symptoms with significant elevated BP that could have been attributed to hpertensive encephalopathy.   He had right ventriculostomy, later replacement of tube due to blockage.  SUBJECTIVE Complains of jerking left side.  Filed Vitals:   10/13/11 0600 10/13/11 0700 10/13/11 0800 10/13/11 0809  BP: 129/73 142/84 127/78 127/78  Pulse: 74 80 69 66  Temp:      TempSrc:      Resp: 16 18 16 21   Height:      Weight:      SpO2: 100% 100% 100% 100%   CBG (last 3)   Basename 10/13/11 0355 10/13/11 0003 10/12/11 2043  GLUCAP 91 112* 87   Intake/Output from previous day: 08/27 0701 - 08/28 0700 In: 1792.3 [I.V.:1592.3] Out: 1023 [Urine:875; Drains:148]  IV Fluid Intake:      . sodium chloride 10 mL/hr at 10/13/11 0800  . dextrose 50 mL/hr at 10/13/11 0800  . fentaNYL infusion INTRAVENOUS 75 mcg/hr (10/13/11 0800)  . propofol Stopped (10/12/11 0900)   Diet:    NPO for PEG Activity: bedrest DVT Prophylaxis:  Lovenox 40 mg sq daily   Studies: Results for orders placed during the hospital encounter of 10/01/11 (from the past 24 hour(s))  GLUCOSE, CAPILLARY     Status: Normal   Collection Time   10/12/11 11:21 AM      Component Value Range   Glucose-Capillary 94  70 - 99 mg/dL  GLUCOSE, CAPILLARY     Status: Normal   Collection Time   10/12/11  3:55 PM      Component Value Range   Glucose-Capillary 94   70 - 99 mg/dL   Comment 1 Notify RN     Comment 2 Documented in Chart    GLUCOSE, CAPILLARY     Status: Normal   Collection Time   10/12/11  8:43 PM      Component Value Range   Glucose-Capillary 87  70 - 99 mg/dL  GLUCOSE, CAPILLARY     Status: Abnormal   Collection Time   10/13/11 12:03 AM      Component Value Range   Glucose-Capillary 112 (*) 70 - 99 mg/dL  GLUCOSE, CAPILLARY     Status: Normal   Collection Time   10/13/11  3:55 AM      Component Value Range   Glucose-Capillary 91  70 - 99 mg/dL  CBC     Status: Abnormal   Collection Time   10/13/11  4:30 AM      Component Value Range   WBC 15.3 (*) 4.0 - 10.5 K/uL   RBC 3.37 (*) 4.22 - 5.81 MIL/uL   Hemoglobin 9.7 (*) 13.0 - 17.0 g/dL   HCT 16.1 (*) 09.6 - 04.5 %   MCV 92.0  78.0 - 100.0 fL   MCH 28.8  26.0 - 34.0 pg   MCHC 31.3  30.0 - 36.0 g/dL  RDW 13.7  11.5 - 15.5 %   Platelets 382  150 - 400 K/uL  BASIC METABOLIC PANEL     Status: Abnormal   Collection Time   10/13/11  4:30 AM      Component Value Range   Sodium 140  135 - 145 mEq/L   Potassium 3.6  3.5 - 5.1 mEq/L   Chloride 107  96 - 112 mEq/L   CO2 26  19 - 32 mEq/L   Glucose, Bld 104 (*) 70 - 99 mg/dL   BUN 23  6 - 23 mg/dL   Creatinine, Ser 3.08  0.50 - 1.35 mg/dL   Calcium 9.8  8.4 - 65.7 mg/dL   GFR calc non Af Amer >90  >90 mL/min   GFR calc Af Amer >90  >90 mL/min  PROTIME-INR     Status: Abnormal   Collection Time   10/13/11  4:30 AM      Component Value Range   Prothrombin Time 15.7 (*) 11.6 - 15.2 seconds   INR 1.22  0.00 - 1.49    CSF cx neg Resp Cx few staph aureus Blood Cx no growth Urine Cx neg  Dg Chest Port 1 View 10/12/2011 1. Interval placement of tracheostomy, appropriately positioned  without pneumothorax. 2. Low lung volumes. Patchy right greater than left airspace disease which is favored to represent atelectasis. 3. Cardiomegaly without congestive failure. 10/09/2011  Stable support device. 2.  Decreased lung volumes.  10/08/2011  1.  Overall improved aeration with decreased atelectasis at the bases. 2.  Interval placement of a nasogastric tube, which extends beyond the  inferior aspect of the film.     10/07/2011:   Mild cardiac enlargement. 2.  Bibasilar atelectasis. 3.  ET tube tip is stable above the carina.    Neurological Exam : Mental Status: Alert.  Able to follow 3 step commands without difficulty.  Intubated.  Mitt on right hand. Cranial Nerves: II-blinks to confrontation less on left than right. III/IV/VI-mild right gaze preference.  Vertical gaze paralysis.  Pupils reactive bilaterally. V/VII-unable to assess VIII-grossly intact IX/X-unable to assess XI-unable to shrug left shoulder XII-unable to assess Motor: flaccid LUE/LLE, 5/5 RUE/RLE Sensory: does not withdraw to noxious stim on left. Deep Tendon Reflexes: 2+ and symmetric throughout Plantars: upgoing on left, downgoing right Cerebellar: unable to assess  ASSESSMENT [name] is a 46 y.o. male with hypertensive right thalamic intracerebral hemorrhage with intraventricular extension with mild hydrocephalus and cytotoxic cerebral edema. Hemorrhage secondary to accelerated hypertension, BP on arrival 164/125.  Neurologically worsening led to IVC placement and intubation. IVC clotted 10/06/2011 and replaced, continues to drain. Likely VP shunt early next week per NS note. Remains awake and interactive, following commands.   -VDRF. S/p trach by Dr. Jenne Pane 8/26. on trach collar this am. Left sided clonus. Will add baclofen. -low-grade temperature with progressive leukocytosis. Neurosurgeon took sample of CSF. CSF WBCs were 390. RBC 18050, culture was negative. Ucx neg. Blood Cx no growth. On Zosyn (stopped 8/27) and Vanc (stopped 8/26). CCM following temp curve. -History of medication noncompliance. -accelerated hypertension -dysphagia secondary to stroke. On tube feedings. IR consulted for PEG placement. -Left hemiparesis  Hospital day #  12  TREATMENT/PLAN -neurosurgery managing IVC. likely VP shunt -PEG today by IR -add baclofen for clonus once able to use PEG -stop novolog, CBG checks (HgbA1c was norma) -will increase Lovenox to 60mg  tomorrow, after PEG placed This patient is critically ill and at significant risk of neurological worsening, death  and care requires constant monitoring of vital signs, hemodynamics,respiratory and cardiac monitoring,review of multiple databases, neurological assessment, discussion with family, other specialists and medical decision making of high complexity. I spent 30 minutes of neurocritical care time  in the care of  this patient.   Annie Main, MSN, RN, ANVP-BC, ANP-BC, [facility] Pager: [phone] 10/13/2011 8:41 AM  Scribe for Dr. Delia Heady, Stroke Center Medical Director, who has personally reviewed chart, pertinent data, examined the patient and developed the plan of care. Pager:  [phone]

## 2011-10-13 NOTE — Progress Notes (Signed)
Clinically stable. No new issues. Still requiring near continuous CSF drainage despite raising the drain height. We'll check followup CT scan today. I will make a decision regarding possible VP shunt based on the scan and CSF to output through the day. May even consider VP shunt placement as early as tomorrow afternoon if no new problems develop.

## 2011-10-13 NOTE — ED Notes (Signed)
Incision for g-tube

## 2011-10-14 DIAGNOSIS — I619 Nontraumatic intracerebral hemorrhage, unspecified: Secondary | ICD-10-CM

## 2011-10-14 DIAGNOSIS — G8110 Spastic hemiplegia affecting unspecified side: Secondary | ICD-10-CM

## 2011-10-14 DIAGNOSIS — I69991 Dysphagia following unspecified cerebrovascular disease: Secondary | ICD-10-CM

## 2011-10-14 LAB — CULTURE, BLOOD (ROUTINE X 2)
Culture: NO GROWTH
Culture: NO GROWTH

## 2011-10-14 MED ORDER — JEVITY 1.2 CAL PO LIQD
1000.0000 mL | ORAL | Status: DC
  Administered 2011-10-14 – 2011-10-18 (×3): 1000 mL
  Filled 2011-10-14 (×11): qty 1000

## 2011-10-14 MED ORDER — PRO-STAT SUGAR FREE PO LIQD
30.0000 mL | Freq: Three times a day (TID) | ORAL | Status: DC
  Administered 2011-10-14 – 2011-10-22 (×24): 30 mL
  Filled 2011-10-14 (×26): qty 30

## 2011-10-14 MED ORDER — BACITRACIN-NEOMYCIN-POLYMYXIN OINTMENT TUBE
TOPICAL_OINTMENT | Freq: Every day | CUTANEOUS | Status: AC
  Administered 2011-10-14: 1 via TOPICAL
  Administered 2011-10-15 – 2011-10-20 (×6): via TOPICAL
  Filled 2011-10-14: qty 15

## 2011-10-14 MED ORDER — ENOXAPARIN SODIUM 60 MG/0.6ML ~~LOC~~ SOLN
60.0000 mg | SUBCUTANEOUS | Status: DC
  Administered 2011-10-14 – 2011-10-22 (×9): 60 mg via SUBCUTANEOUS
  Filled 2011-10-14 (×9): qty 0.6

## 2011-10-14 NOTE — Progress Notes (Signed)
Name: Jeremy [name] MRN: [id number] DOB: 20-May-1965    LOS: 13  Referring Provider:  Neuro Reason for Referral:  CVA   PULMONARY / CRITICAL CARE MEDICINE  HPI:  46 yo AAM with PMH of HTN presented to [facility] ED on 8/16 via EMS as a code stroke.  He woke up at 7 am and was unable to ambulate to bathroom, use left hand and was incontinent of urine.  He apparently woke several times during the night to urinate (last at 4am).  He noted right sided headache and vomited in the ambulance.  CT eval demonstrated R basal banglia hemorrhage with minimal surrounding edema.  PCCM consulted for ICU mgmt.    Brief patient description:  46 y/o AAM with HTN admitted with R basal ganglia hemorrhage.   Events Since Admission: 8/16 Admit with basal ganglia hemorrhage 8/20 Intubated for airway protection and elevated ICP with IVC drain placement 8/21 IVC drain replaced d/t prior drain malfunction  Subjective:  PEG placed 8/28.  Tolerated TC all night 8/28-9  Vital Signs: Temp:  [98.5 F (36.9 C)-100.1 F (37.8 C)] 99.6 F (37.6 C) (08/29 0400) Pulse Rate:  [72-100] 78  (08/29 1000) Resp:  [20-28] 26  (08/29 0900) BP: (122-157)/(73-108) 128/88 mmHg (08/29 1000) SpO2:  [35 %-100 %] 98 % (08/29 1000) FiO2 (%):  [35 %-60 %] 35 % (08/29 1000) Weight:  [121.3 kg (267 lb 6.7 oz)] 121.3 kg (267 lb 6.7 oz) (08/29 0433)  Intake/Output Summary (Last 24 hours) at 10/14/11 1043 Last data filed at 10/14/11 1000  Gross per 24 hour  Intake 1753.33 ml  Output   2419 ml  Net -665.67 ml    Physical Examination: Gen: ill appearing, awake HEENT: NCAT, PERRL, EOMi, trach in place PULM: scattered rhonchi CV: RRR, no mgr, no JVD AB: BS+, soft, nontender, no hsm Ext: warm, no edema, no clubbing, no cyanosis Derm: no rash or skin breakdown Neuro: awake, interacting  Principal Problem:  *Malignant hypertension requiring acute intensive management Active Problems:  ICH (intracerebral hemorrhage)  HTN  (hypertension)  Hypoxemia  Altered mental status  Cytotoxic cerebral edema  Obstructive hydrocephalus  Acute respiratory failure with hypoxia  Elevated intracranial pressure   ASSESSMENT AND PLAN  NEUROLOGIC CPP 71  ICP 9 8/25 8/20  CT Head >> R thalamic hematoma slightly enlarged with surrounding edema; 2mm midline shift, no change to sl smaller volume of blood  CNS DRAINS: IVC drain 8/20>>8/21 replaced >>  A:   Acute R Basal Ganglia Hemorrhage -hemorrhage with small amt edema Acute resp failure d/t Intraventric blood and hydroceph improve with IVC  ELEVATED ICP with IVC drain improved with drainage. Increased output with drain P:   Cont IVC drain, note CSF cultures negative Per neurosurgery >> monitoring ICP's, probable VP shunt next week   PULMONARY No results found for this basename: PHART:5,PCO2:5,PCO2ART:5,PO2ART:5,HCO3:5,O2SAT:5 in the last 168 hours Ventilator Settings: Vent Mode:  [-]  FiO2 (%):  [35 %-60 %] 35 % CXR:  none ETT:  8/20>>8/22>>reintubated 8/22>>8/26 Janina Mayo (Dr Jenne Pane) 8/26 >>   A:   Acute respiratory failure d/t hydroceph d/t intraventric blood with thalamic bleed P:   -TC as tolerated -speech rx following PMV, probably won't be able to do well until trach downsize  CARDIOVASCULAR No results found for this basename: TROPONINI:5,LATICACIDVEN:5, O2SATVEN:5,PROBNP:5 in the last 168 hours ECG:  None Lines: L IJ CVL 8/20>>> 8/16 TTE >> limited study, LVEF 60-70%  A: HTN, neg cardiac enzymes,  P:  Prn BP meds ordered  Start amlodipine 10mg  qd per tube 8/29   RENAL  Lab 10/13/11 0430 10/12/11 0415 10/11/11 0338 10/10/11 0335 10/09/11 0300  NA 140 143 146* 144 140  K 3.6 4.5 -- -- --  CL 107 108 109 107 101  CO2 26 29 30 30 31   BUN 23 27* 37* 39* 43*  CREATININE 0.73 0.85 0.88 0.82 0.89  CALCIUM 9.8 9.8 9.7 9.6 9.6  MG -- -- -- -- --  PHOS -- -- -- -- --    Intake/Output Summary (Last 24 hours) at 10/14/11 1043 Last data filed at  10/14/11 1000  Gross per 24 hour  Intake 1753.33 ml  Output   2419 ml  Net -665.67 ml    Foley:  8/16>>>out 8/21>>8/23>>>  A:   Hyponatremia better with IVC  Was prob d/t cerebral salt wasting with hydroceph Hypokalemia  persists P:   Follow bmp  GASTROINTESTINAL No results found for this basename: AST:5,ALT:5,ALKPHOS:5,BILITOT:5,PROT:5,ALBUMIN:5 in the last 168 hours  A:   No active GI issues P:   Restart TF per PEG  HEMATOLOGIC  Lab 10/13/11 0430 10/12/11 0415 10/11/11 0338 10/10/11 0335 10/09/11 0300  HGB 9.7* 10.2* 10.5* 11.0* 11.3*  HCT 31.0* 32.0* 33.4* 34.6* 34.3*  PLT 382 363 348 333 308  INR 1.22 -- -- -- --  APTT -- -- -- -- --   A:   No acute issues P:  -monitor cbc -ok on LMWH DVT proph   INFECTIOUS  Lab 10/13/11 0430 10/12/11 0415 10/11/11 0338 10/10/11 0335 10/09/11 0300  WBC 15.3* 16.2* 13.1* 13.2* 17.6*  PROCALCITON -- -- -- -- --   Cultures: 8/16 UC>>neg 8/23 CSF>>> negative 8/23 BCx2>>>neg 8/23 Resp c/s>>> rare S aureus  Antibiotics: Zosyn 8/23 (?csf infx)>> 8/27 vanco 8/23 (? Csf )>> 8/26 A:   Fever, ? CSF vs pulm source, CXR with low lung volumes P:   -all cx negative except rare staph in sputum, d/c'd vanco 8/26 and then d/c zosyn on 8/27, follow fever, CBC   ENDOCRINE  Lab 10/13/11 1242 10/13/11 0754 10/13/11 0355 10/13/11 0003 10/12/11 2043  GLUCAP 91 99 91 112* 87   A:   Hyperglycemia P:   -SSI   BEST PRACTICE / DISPOSITION Level of Care:  ICU Primary Service:  PCCM Consultants:  Neurology, ENT Jenne Pane Code Status:  Full Diet:  TF DVT Px:  LMWH  GI Px:  ppi Skin Integrity:  intact Social / Family:    Levy Pupa, MD, PhD 10/14/2011, 10:43 AM [facility] [phone] or if no answer [phone]

## 2011-10-14 NOTE — Consult Note (Signed)
Physical Medicine and Rehabilitation Consult  Reason for Consult: ICH with left sided weakness, visual problems Referring Physician: Dr. Delton Coombes   HPI: Jeremy [name] is a 46 y.o. male with history of HTN-no medications x years who was admitted on 10/01/11 with headache, N/V, and difficulty using left hand. CT head revealed R-BG hemorrhage with surrounding edema. Started on nicardipine drip for BP management. Patient developed somnolence due to extension of hemorrhage to right thalamus and posterior third ventricle with concerns for developing obstructive hydrocephalus.  Patient intubated. Dr. Jordan Likes consulted and IVC placed. Did require replacement on 08/21 due to malfunction.  He had difficulty weaning of vent required trach on 08/26 and gastrostomy tube placed by IVR on 08/28. Patient continues to have drainage form IVC and plans for VPS in the near future. Patient continues with left hemiparesis with spasticity, right gaze preference and showing improvement in mentation. IVC dislodged this pm.  PT/OT revaluations pending.  MD recommending CIR.   Review of Systems  Unable to perform ROS: patient nonverbal   Past Medical History  Diagnosis Date  . Hypertension   . Stroke    Past Surgical History  Procedure Date  . Tracheostomy tube placement 10/11/2011    Procedure: TRACHEOSTOMY;  Surgeon: Christia Reading, MD;  Location: [facility] OR;  Service: ENT;  Laterality: N/A;   History reviewed. No pertinent family history.  Social History:  Married. Per reports that he has never smoked. He does not have any smokeless tobacco history on file. Per reports that he does not drink alcohol or use illicit drugs.  Allergies: No Known Allergies  No prescriptions prior to admission    Home: Home Living Lives With: Spouse Available Help at Discharge: Family;Available 24 hours/day Type of Home: House Home Access: Stairs to enter [employer] of Steps: 2 Entrance Stairs-Rails: None Home Layout: One  level Bathroom Shower/Tub: Forensic scientist: Standard Bathroom Accessibility: Yes How Accessible: Accessible via walker Home Adaptive Equipment: None Additional Comments: enjoys watching tv (wrestling) , like to participate with church, mows grass at home, likes to play on computer  Functional History: Prior Function Able to Take Stairs?: Yes Driving: Yes 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 Functional Status:  Mobility: Bed Mobility Bed Mobility: Supine to Sit;Sitting - Scoot to [place] of Bed;Sit to Supine;Rolling Right;Rolling Left Rolling Right: 2: Max assist;With rail Rolling Left: 1: +1 Total assist Supine to Sit: 1: +2 Total assist;With rails;HOB elevated Supine to Sit: Patient Percentage: 20% Sitting - Scoot to Edge of Bed: 1: +2 Total assist;With rail Sitting - Scoot to [place] of Bed: Patient Percentage: 10% Sit to Supine: 1: +2 Total assist;With rail;HOB flat Sit to Supine: Patient Percentage: 10% Transfers Transfers: Not assessed Ambulation/Gait Ambulation/Gait Assistance: Not tested (comment) Stairs: No Wheelchair Mobility Wheelchair Mobility: No  ADL: ADL Grooming: Simulated;Wash/dry face;Minimal assistance Where Assessed - Grooming: Supine, head of bed up Transfers/Ambulation Related to ADLs: not appropriate at this time ADL Comments: Pt supine on arrival. Pt kept eyes closed the entire session. Pt required manual (A) to open eye lids and pt unable accurately report any visual input. Pt responding to all questions appropriately with delay response. Pt following simple commands with difficulty terminating task. Pt with associated reaction on LT UE. Pt no grip strength. Pt adducting LT UE to body with tone present.   Cognition: Cognition Overall Cognitive Status: Impaired Arousal/Alertness: Lethargic Orientation Level: Oriented to person;Oriented to place;Oriented to time (trached and vented) Attention: Focused Focused  Attention: Impaired Focused Attention Impairment:  Verbal basic;Functional basic Memory: Impaired Memory Impairment: Retrieval deficit;Decreased recall of new information;Decreased short term memory Decreased Short Term Memory: Verbal basic;Functional basic Awareness: Impaired Awareness Impairment: Intellectual impairment;Emergent impairment;Anticipatory impairment Problem Solving: Impaired Problem Solving Impairment: Verbal basic;Functional basic Executive Function: Reasoning;Sequencing Reasoning: Impaired Reasoning Impairment: Verbal basic;Functional basic Sequencing: Impaired Sequencing Impairment: Verbal basic;Functional basic Behaviors: Restless;Impulsive Safety/Judgment: Impaired Cognition Overall Cognitive Status: Impaired Area of Impairment: Memory;Following commands;Safety/judgement;Awareness of deficits;Awareness of errors;Problem solving;Attention Arousal/Alertness: Lethargic Orientation Level: Disoriented to;Place Behavior During Session: [facility] for tasks performed Current Attention Level: Focused Memory: Decreased recall of precautions Following Commands: Follows one step commands inconsistently Safety/Judgement: Decreased awareness of safety precautions;Decreased safety judgement for tasks assessed;Impulsive;Decreased awareness of need for assistance Awareness of Errors: Assistance required to identify errors made Problem Solving: Pt reporting that he is currently at the church Cognition - Other Comments: pt inconsistent with responses, decreased attention. Pt with random comments throughout session. Pt with partial neglect of LLE as well problems with all problem solving  Blood pressure 144/98, pulse 88, temperature 99.6 F (37.6 C), temperature source Oral, resp. rate 30, height 5\' 10"  (1.778 m), weight 121.3 kg (267 lb 6.7 oz), SpO2 100.00%. Physical Exam  Nursing note and vitals reviewed. Constitutional: He appears well-developed and well-nourished.  HENT:  Head:  Normocephalic.       Staples right scalp  Eyes: Pupils are equal, round, and reactive to light.  Neck:       Trach in place.   Cardiovascular: Normal rate and regular rhythm.   Pulmonary/Chest: Effort normal. He has wheezes.       Congested sounds upper airway.  Abdominal: Soft. There is tenderness (around PEG site).  Neurological: He is alert.       Left inattention but does scan to left with cueing.  Left field cut? Able to mouth words.  Able to follow simple commands Left hemiparesis with extensor tone LLE.   motor strength is 0/5 in the left upper and left lower extremity Tone is increased in the extensors of the left lower extremity and is hypotonic in the left upper extremity Sensation difficult to assess secondary to level alertness  Results for orders placed during the hospital encounter of 10/01/11 (from the past 24 hour(s))  GLUCOSE, CAPILLARY     Status: Normal   Collection Time   10/13/11 12:42 PM      Component Value Range   Glucose-Capillary 91  70 - 99 mg/dL   Ct Head Wo Contrast  10/13/2011  *RADIOLOGY REPORT*  Clinical Data: 46 year old male status post intracranial hemorrhage 10-12 days ago.  Nausea vomiting headache.  CT HEAD WITHOUT CONTRAST  Technique:  Contiguous axial images were obtained from the base of the skull through the vertex without contrast.  Comparison: 10/06/2011 and earlier.  Findings: EDD remains in place. Stable visualized osseous structures.  Stable paranasal sinuses and mastoids.  Stable orbit and scalp soft tissues.  A small volume of hemorrhage along the course of the right frontal approach ventriculostomy.  This is stable to mildly increased.  No significant associated mass effect.  Interval resolution of a small volume of hemorrhage at the tip of the catheter in the right frontal horn.  Stable ventricle size configuration.  Interval resolved layering of occipital horn IVH.  Interval expected evolution of right thalamic hemorrhage with less distinct  hyperdense blood products now measuring 38 2 x 14 mm transaxially (previously 39 x 17).  Stable surrounding edema and mild mass effect.  Basilar cisterns remain patent.  No  new areas of intracranial hemorrhage.  Stable gray-white matter differentiation elsewhere.  No new cortically based infarct.  IMPRESSION: 1.  Mild regression and overall expected evolution of right thalamic hemorrhage.  Stable mild edema and mass effect. 2.  Right frontal approach EVD remains in place.  Stable to slightly increased blood products along the parenchymal course of the shunt.  IVH has resolved.  Stable ventricle size and configuration. 3.  No new intracranial abnormality.   Original Report Authenticated By: Harley Hallmark, M.D.    Ir Gastrostomy Tube Mod Sed  10/13/2011  *RADIOLOGY REPORT*  Indication:  Dysphagia following cerebral hemorrhage  PULL TROUGH GASTOSTOMY TUBE PLACEMENT  Medications:  Versed 2 mg IV; Fentanyl 100 mcg IV; Antibiotics were administered within 1 hour of the procedure.  Contrast volume:  20 mL Omnipaque-300 administered into the gastric lumen  Sedation time: 30 minutes  Fluoroscopy time: 5.8 minutes  Complications: None immediate  PROCEDURE/FINDINGS:  Informed written consent was obtained from the patient following explanation of the procedure, risks, benefits and alternatives.  A time out was performed prior to the initiation of the procedure. Maximal barrier sterile technique utilized including caps, mask, sterile gowns, sterile gloves, large sterile drape, hand hygiene and Betadine prep.  Limited ultrasound scanning was performed to demarcate the lateral border of the left lobe of the liver.  The left upper quadrant was sterilely prepped and draped.  An oral gastric catheter was inserted into the stomach under fluoroscopy.  The existing nasogastric feeding tube was removed.  The left costal margin and barium opacified transverse colon were identified and avoided.  Air was injected into the stomach for  insufflation and visualization under fluoroscopy.  Under sterile conditions a 17 gauge trocar needle was utilized to access the stomach percutaneously beneath the left subcostal margin after the overlying soft tissues were anesthetized with 1% Lidocaine with epinephrine.  Needle position was confirmed within the stomach with aspiration of air and injection of small amount of contrast.   A single T tack was deployed for gastropexy.  Over an Amplatz guide wire, a 9-French sheath was inserted into the stomach.  A snare device was utilized to capture the oral gastric catheter.  The snare device was pulled retrograde from the stomach up the esophagus and out the oropharynx.  The 20-French pull-through gastrostomy was connected to the snare device and pulled antegrade through the oropharynx down the esophagus into the stomach and then through the percutaneous tract external to the patient.  The gastrostomy was assembled externally.  Contrast injection confirms position in the stomach.   Several spot radiographic images were obtained in various obliquities for documentation.  The patient tolerated procedure well without immediate post procedural complication.  IMPRESSION:  Successful fluoroscopic insertion of a 20-French "pull-through" gastrostomy.   Original Report Authenticated By: Waynard Reeds, M.D.     Assessment/Plan: Diagnosis: left spastic hemiparesis due to right thalamic hemorrhage with obstructive  hydrocephalusand cerebral edema. 1. Does the need for close, 24 hr/day medical supervision in concert with the patient's rehab needs make it unreasonable for this patient to be served in a less intensive setting? Potentially 2. Co-Morbidities requiring supervision/potential complications: morbid obesity, spasticity, severe dyskinesia requiring PEG feeds n.p.o. As well as tracheostomy 3. Due to bladder management, bowel management, safety, skin/wound care, disease management, medication administration and  patient education, does the patient require 24 hr/day rehab nursing? Potentially 4. Does the patient require coordinated care of a physician, rehab nurse, PT (1-2 hrs/day, 5 days/week),  OT (1-2 hrs/day, 5 days/week) and SLP (0.5-1 hrs/day, 5 days/week) to address physical and functional deficits in the context of the above medical diagnosis(es)? Potentially Addressing deficits in the following areas: balance, endurance, locomotion, strength, transferring, bowel/bladder control, bathing, dressing, feeding, grooming, toileting, cognition, speech and swallowing 5. Can the patient actively participate in an intensive therapy program of at least 3 hrs of therapy per day at least 5 days per week? No 6. The potential for patient to make measurable gains while on inpatient rehab is should make good progress once he is able to participate 7. Anticipated functional outcomes upon discharge from inpatient rehab are min assist mobility wheelchair level with PT, min assist ADLs with OT, 100% orientation, express basic medical needs with SLP. 8. Estimated rehab length of stay to reach the above functional goals is: 4 weeks 9. Does the patient have adequate social supports to accommodate these discharge functional goals? Potentially 10. Anticipated D/C setting: Home 11. Anticipated post D/C treatments: HH therapy 12. Overall Rehab/Functional Prognosis: fair  RECOMMENDATIONS: This patient's condition is appropriate for continued rehabilitative care in the following setting: would anticipate CIR after VP shunt is placed and patient is demonstrating good participation in physical and occupational therapy Patient has agreed to participate in recommended program. Potentially Note that insurance prior authorization may be required for reimbursement for recommended care.  Comment:    10/14/2011

## 2011-10-14 NOTE — Progress Notes (Signed)
Stroke Team Progress Note  HISTORY Mr. Rahm Minix is a 46 y.o. male  who had been c/o of headaches for few days, with nausea and vomiting,  On 10/01/11 morning according to his wife he had visual disturbance and then he vomited followed by left sided weakness approximately 7 AM. Patient arrived to the ED. Head CT was done that showed, Right BG, bleed with some area involved in thalamus. With minimal mass effect.   According to the wife he has stopped taking his BP medications several years ago and does not go the physician as suggested. Previously, he had significant neurological symptoms with significant elevated BP that could have been attributed to hpertensive encephalopathy.   He had right ventriculostomy, later replacement of tube due to blockage.  SUBJECTIVE Complains of jerking left side. ventric yet putting out lot of CSF despite pop of off 25.  Filed Vitals:   10/14/11 0519 10/14/11 0600 10/14/11 0700 10/14/11 0800  BP:  137/82 136/87 129/85  Pulse: 74 84 75 72  Temp:      TempSrc:      Resp: 21 25 24 24   Height:      Weight:      SpO2: 89% 99% 99% 99%   CBG (last 3)   Basename 10/13/11 1242 10/13/11 0754 10/13/11 0355  GLUCAP 91 99 91   Intake/Output from previous day: 08/28 0701 - 08/29 0700 In: 1610.8 [I.V.:1400.8] Out: 2581 [Urine:1355; Drains:1226]  IV Fluid Intake:      . sodium chloride 20 mL/hr at 10/14/11 0800  . dextrose 50 mL/hr at 10/14/11 0800  . DISCONTD: fentaNYL infusion INTRAVENOUS Stopped (10/13/11 0900)  . DISCONTD: propofol Stopped (10/12/11 0900)   Diet:    NPO for PEG Activity: bedrest DVT Prophylaxis:  Lovenox 40 mg sq daily   Studies: Results for orders placed during the hospital encounter of 10/01/11 (from the past 24 hour(s))  GLUCOSE, CAPILLARY     Status: Normal   Collection Time   10/13/11 12:42 PM      Component Value Range   Glucose-Capillary 91  70 - 99 mg/dL    CSF cx neg Resp Cx few staph aureus Blood Cx no  growth Urine Cx neg  Dg Chest Port 1 View 10/12/2011 1. Interval placement of tracheostomy, appropriately positioned  without pneumothorax. 2. Low lung volumes. Patchy right greater than left airspace disease which is favored to represent atelectasis. 3. Cardiomegaly without congestive failure. 10/09/2011  Stable support device. 2.  Decreased lung volumes.  10/08/2011 1.  Overall improved aeration with decreased atelectasis at the bases. 2.  Interval placement of a nasogastric tube, which extends beyond the  inferior aspect of the film.     10/07/2011:   Mild cardiac enlargement. 2.  Bibasilar atelectasis. 3.  ET tube tip is stable above the carina.    Neurological Exam : Mental Status: Alert.  Able to follow 3 step commands without difficulty.  Intubated.  Mitt on right hand. Cranial Nerves: II-blinks to confrontation less on left than right. III/IV/VI-mild right gaze preference.  Vertical gaze paralysis.  Pupils reactive bilaterally. V/VII-unable to assess VIII-grossly intact IX/X-unable to assess XI-unable to shrug left shoulder XII-unable to assess Motor: flaccid LUE/LLE, 5/5 RUE/RLE Sensory: does not withdraw to noxious stim on left. Deep Tendon Reflexes: 2+ and symmetric throughout Plantars: upgoing on left, downgoing right Cerebellar: unable to assess  ASSESSMENT Mr. Naftali Carchi is a 46 y.o. male with hypertensive right thalamic intracerebral hemorrhage with intraventricular extension with mild hydrocephalus  and cytotoxic cerebral edema. Hemorrhage secondary to accelerated hypertension, BP on arrival 164/125.  Neurologically worsening led to IVC placement and intubation. IVC clotted 10/06/2011 and replaced, continues to drain. Likely VP shunt early next week per NS note. Remains awake and interactive, following commands.   -VDRF. S/p trach by Dr. Jenne Pane 8/26. Has been on trach collar x 24h -low-grade temperature with progressive leukocytosis. Neurosurgeon took sample of CSF. CSF  WBCs were 390. RBC 18050, culture was negative. Ucx neg. Blood no growth. On Zosyn (stopped 8/27) and Vanc (8/26). CCM following temp curve.stopped Cx  -History of medication noncompliance. -accelerated hypertension -dysphagia secondary to stroke. PEG placed 8/28 by IR. -Left hemiparesis -left sided clonus, baclofen added  Hospital day # 13  TREATMENT/PLAN -neurosurgery managing IVC. OP still high. likely VP shunt next week -resume Lovenox, increase to 60mg  due to obesity -ok to use new PEG per IR. Resume tube feedings    Annie Main, MSN, RN, ANVP-BC, ANP-BC, GNP-BC [facility] Pager: [phone] 10/14/2011 8:45 AM  Scribe for Dr. Delia Heady, Stroke Center Medical Director, who has personally reviewed chart, pertinent data, examined the patient and developed the plan of care. Pager:  [phone]

## 2011-10-14 NOTE — Progress Notes (Signed)
Arrived to attempt therapy for [facility] Speaking Valve, pt with unable to participate due to medical needs. SLP will f/u in one day. Harlon Ditty, MA CCC-SLP [phone]

## 2011-10-14 NOTE — Evaluation (Signed)
Physical Therapy Evaluation Patient Details Name: Jeremy [name] MRN: [id number] DOB: [birth year] Today's Date: 10/14/2011 Time: 4098-1191 PT Time Calculation (min): 19 min  PT Assessment / Plan / Recommendation Clinical Impression  Pt admitted with CT eval demonstrated R basal banglia hemorrhage with minimal surrounding edema. Pt with safety concerns including problem solving as well as impulsiveness and decreased awareness of deficits. Pt with L hemiplegia, minimal movement. Pt will benefit from skilled PT in the acute care setting in order to maximize functional mobility, strength and safety. Pt intubated s/p last PT eval for one week and is currently on a trach and a feeding tube. Pt will benefit from CIR as he is highly motivated to get better  and has good R sided strength.     PT Assessment  Patient needs continued PT services    Follow Up Recommendations  Inpatient Rehab;Supervision/Assistance - 24 hour    Barriers to Discharge        Equipment Recommendations       Recommendations for Other Services Rehab consult   Frequency Min 4X/week    Precautions / Restrictions Precautions Precautions: Fall Restrictions Weight Bearing Restrictions: No   Pertinent Vitals/Pain VSS throughout session. No c/o pain      Mobility  Bed Mobility Bed Mobility: Supine to Sit;Sitting - Scoot to Edge of Bed;Sit to Supine Supine to Sit: 1: +2 Total assist Supine to Sit: Patient Percentage: 10% Sitting - Scoot to Edge of Bed: 1: +2 Total assist;With rail Sitting - Scoot to [place] of Bed: Patient Percentage: 0% Sit to Supine: 1: +2 Total assist Sit to Supine: Patient Percentage: 10% Details for Bed Mobility Assistance: VC for sequencing. Pt unable to move L side at all requiring assistance with UE and LE as well as support through trunk. Pt large stature requiring increased assistance for safety. +3 assistance due to impulsiveness and safety during transfer Transfers Transfers: Not  assessed Modified Rankin (Stroke Patients Only) Pre-Morbid Rankin Score: No symptoms Modified Rankin: Severe disability    Exercises     PT Diagnosis: Hemiplegia non-dominant side;Difficulty walking  PT Problem List: Decreased strength;Decreased activity tolerance;Decreased balance;Decreased mobility;Decreased knowledge of use of DME;Decreased safety awareness;Decreased knowledge of precautions;Pain PT Treatment Interventions: DME instruction;Functional mobility training;Therapeutic activities;Therapeutic exercise;Balance training;Neuromuscular re-education;Cognitive remediation;Patient/family education   PT Goals Acute Rehab PT Goals PT Goal Formulation: With patient Time For Goal Achievement: 10/28/11 Potential to Achieve Goals: Fair Pt will go Supine/Side to Sit: with mod assist PT Goal: Supine/Side to Sit - Progress: Goal set today Pt will Sit at Edge of Bed: with min assist;3-5 min PT Goal: Sit at [place] Of Bed - Progress: Goal set today Pt will go Sit to Supine/Side: with mod assist PT Goal: Sit to Supine/Side - Progress: Goal set today Pt will go Sit to Stand: with max assist PT Goal: Sit to Stand - Progress: Goal set today Pt will go Stand to Sit: with max assist PT Goal: Stand to Sit - Progress: Goal set today Pt will Transfer Bed to Chair/Chair to Bed: with +2 total assist PT Transfer Goal: Bed to Chair/Chair to Bed - Progress: Goal set today Pt will Stand: with +2 total assist PT Goal: Stand - Progress: Goal set today  Visit Information  Last PT Received On: 10/14/11 Assistance Needed: +3 or more    Subjective Data  Patient Stated Goal: none given   Prior Functioning  Home Living Lives With: Spouse Available Help at Discharge: Family;Available 24 hours/day Type of Home: House Home Access: Stairs  to enter Entrance Stairs-Number of Steps: 2 Entrance Stairs-Rails: None Home Layout: One level Bathroom Shower/Tub: Forensic scientist:  Standard Bathroom Accessibility: Yes How Accessible: Accessible via walker Home Adaptive Equipment: None Additional Comments: enjoys watching tv (wrestling) , like to participate with church, mows grass at home, likes to play on computer Prior Function Level of Independence: Independent Able to Take Stairs?: Yes Driving: Yes Vocation: Full time employment Communication Communication: No difficulties Dominant Hand: Right    Cognition  Overall Cognitive Status: Impaired Area of Impairment: Memory;Following commands;Safety/judgement;Awareness of deficits;Awareness of errors;Problem solving;Attention Arousal/Alertness: Lethargic Orientation Level: Disoriented to;Place Behavior During Session: [facility] for tasks performed Current Attention Level: Focused Memory: Decreased recall of precautions Following Commands: Follows one step commands inconsistently Safety/Judgement: Decreased awareness of safety precautions;Decreased safety judgement for tasks assessed;Impulsive;Decreased awareness of need for assistance Awareness of Errors: Assistance required to identify errors made Cognition - Other Comments: pt inconsistent with responses, decreased attention. Pt with random comments throughout session. Pt with partial neglect of LLE as well problems with all problem solving    Extremity/Trunk Assessment Right Lower Extremity Assessment RLE ROM/Strength/Tone: Within functional levels RLE Sensation: WFL - Light Touch Left Lower Extremity Assessment LLE ROM/Strength/Tone: Deficits LLE ROM/Strength/Tone Deficits: Pt with flacid LLE. No movement noted this session LLE Sensation: Deficits LLE Sensation Deficits: Pt unable to feel light touch throughout LLE, inconsistent with responses to feeling pressure.    Balance Static Sitting Balance Static Sitting - Balance Support: Right upper extremity supported;Feet supported Static Sitting - Level of Assistance: 1: +2 Total assist Static Sitting - Comment/#  of Minutes: Pt sitting at EOB for ~3 minutes requiring total assist x 2, Pt 20%. Pt able to laterally shift with increased assistance to the left for support.   End of Session PT - End of Session Equipment Utilized During Treatment: Other (comment);Oxygen (pt trached) Activity Tolerance: Patient tolerated treatment well Patient left: in bed;with call bell/phone within reach;with nursing in room Nurse Communication: Mobility status  GP     Milana Kidney 10/14/2011, 2:22 PM  10/14/2011 Milana Kidney DPT PAGER: [phone] OFFICE: [phone]

## 2011-10-14 NOTE — Progress Notes (Signed)
3 Days Post-Op  Subjective: Gastric tube placed 8/28 Site clean and dry; sl tender Afeb;VSS   Objective: Vital signs in last 24 hours: Temp:  [98.5 F (36.9 C)-100.1 F (37.8 C)] 99.6 F (37.6 C) (08/29 0400) Pulse Rate:  [74-100] 75  (08/29 0700) Resp:  [20-28] 24  (08/29 0700) BP: (122-163)/(73-108) 136/87 mmHg (08/29 0700) SpO2:  [35 %-100 %] 99 % (08/29 0700) FiO2 (%):  [35 %-60 %] 60 % (08/29 0705) Weight:  [267 lb 6.7 oz (121.3 kg)] 267 lb 6.7 oz (121.3 kg) (08/29 0433) Last BM Date: 10/13/11  Intake/Output from previous day: 08/28 0701 - 08/29 0700 In: 1610.8 [I.V.:1400.8] Out: 2581 [Urine:1355; Drains:1226] Intake/Output this shift:    Lab Results:   Basename 10/13/11 0430 10/12/11 0415  WBC 15.3* 16.2*  HGB 9.7* 10.2*  HCT 31.0* 32.0*  PLT 382 363   BMET  Basename 10/13/11 0430 10/12/11 0415  NA 140 143  K 3.6 4.5  CL 107 108  CO2 26 29  GLUCOSE 104* 107*  BUN 23 27*  CREATININE 0.73 0.85  CALCIUM 9.8 9.8   PT/INR  Basename 10/13/11 0430  LABPROT 15.7*  INR 1.22   ABG No results found for this basename: PHART:2,PCO2:2,PO2:2,HCO3:2 in the last 72 hours  Studies/Results: Ct Head Wo Contrast  10/13/2011  *RADIOLOGY REPORT*  Clinical Data: 46 year old male status post intracranial hemorrhage 10-12 days ago.  Nausea vomiting headache.  CT HEAD WITHOUT CONTRAST  Technique:  Contiguous axial images were obtained from the base of the skull through the vertex without contrast.  Comparison: 10/06/2011 and earlier.  Findings: EDD remains in place. Stable visualized osseous structures.  Stable paranasal sinuses and mastoids.  Stable orbit and scalp soft tissues.  A small volume of hemorrhage along the course of the right frontal approach ventriculostomy.  This is stable to mildly increased.  No significant associated mass effect.  Interval resolution of a small volume of hemorrhage at the tip of the catheter in the right frontal horn.  Stable ventricle size  configuration.  Interval resolved layering of occipital horn IVH.  Interval expected evolution of right thalamic hemorrhage with less distinct hyperdense blood products now measuring 38 2 x 14 mm transaxially (previously 39 x 17).  Stable surrounding edema and mild mass effect.  Basilar cisterns remain patent.  No new areas of intracranial hemorrhage.  Stable gray-white matter differentiation elsewhere.  No new cortically based infarct.  IMPRESSION: 1.  Mild regression and overall expected evolution of right thalamic hemorrhage.  Stable mild edema and mass effect. 2.  Right frontal approach EVD remains in place.  Stable to slightly increased blood products along the parenchymal course of the shunt.  IVH has resolved.  Stable ventricle size and configuration. 3.  No new intracranial abnormality.   Original Report Authenticated By: Harley Hallmark, M.D.    Ir Gastrostomy Tube Mod Sed  10/13/2011  *RADIOLOGY REPORT*  Indication:  Dysphagia following cerebral hemorrhage  PULL TROUGH GASTOSTOMY TUBE PLACEMENT  Medications:  Versed 2 mg IV; Fentanyl 100 mcg IV; Antibiotics were administered within 1 hour of the procedure.  Contrast volume:  20 mL Omnipaque-300 administered into the gastric lumen  Sedation time: 30 minutes  Fluoroscopy time: 5.8 minutes  Complications: None immediate  PROCEDURE/FINDINGS:  Informed written consent was obtained from the patient following explanation of the procedure, risks, benefits and alternatives.  A time out was performed prior to the initiation of the procedure. Maximal barrier sterile technique utilized including caps, mask, sterile  gowns, sterile gloves, large sterile drape, hand hygiene and Betadine prep.  Limited ultrasound scanning was performed to demarcate the lateral border of the left lobe of the liver.  The left upper quadrant was sterilely prepped and draped.  An oral gastric catheter was inserted into the stomach under fluoroscopy.  The existing nasogastric feeding tube  was removed.  The left costal margin and barium opacified transverse colon were identified and avoided.  Air was injected into the stomach for insufflation and visualization under fluoroscopy.  Under sterile conditions a 17 gauge trocar needle was utilized to access the stomach percutaneously beneath the left subcostal margin after the overlying soft tissues were anesthetized with 1% Lidocaine with epinephrine.  Needle position was confirmed within the stomach with aspiration of air and injection of small amount of contrast.   A single T tack was deployed for gastropexy.  Over an Amplatz guide wire, a 9-French sheath was inserted into the stomach.  A snare device was utilized to capture the oral gastric catheter.  The snare device was pulled retrograde from the stomach up the esophagus and out the oropharynx.  The 20-French pull-through gastrostomy was connected to the snare device and pulled antegrade through the oropharynx down the esophagus into the stomach and then through the percutaneous tract external to the patient.  The gastrostomy was assembled externally.  Contrast injection confirms position in the stomach.   Several spot radiographic images were obtained in various obliquities for documentation.  The patient tolerated procedure well without immediate post procedural complication.  IMPRESSION:  Successful fluoroscopic insertion of a 20-French "pull-through" gastrostomy.   Original Report Authenticated By: Waynard Reeds, M.D.     Anti-infectives:   Assessment/Plan: s/p Procedure(s) (LRB): TRACHEOSTOMY   Gastric tube in place Intact; stable May [place] now  [facility] 10/14/2011

## 2011-10-14 NOTE — Progress Notes (Signed)
Patient found with IVC drain out at bedside.  Patient neurologically intact at baseline.  Dr. Jordan Likes notified and Dr. Danielle Dess stapled IVC incision with 2 staples.  Will continue to monitor patient throughout the day.

## 2011-10-14 NOTE — Progress Notes (Signed)
Nutrition Follow-up  Intervention:    Start Jevity 1.2 @ 30 ml/hr and increase by 10 ml every 4 hours to goal rate of 60 ml/hrwith 30 ml Prostat TID. (Will provide: 2028 kcal, 125 grams protein, 1166 ml H2O  Recommend additional free water of 230 ml H2O QID  Assessment:   Patient is currently on trach collar and is tolerating well per MD.  Pt still has IVC, output remains high, per MD likely shunt next week. Pt discussed in ICU rounds this am.  Temp:Temp (24hrs), Avg:99.3 F (37.4 C), Min:98.5 F (36.9 C), Max:100.1 F (37.8 C) Propofol off  Pt is NPO with no TF infusing PEG placement yesterday by IR. Pt reports that he is hungry. Per RN OK to resume TF.   Last bm: 10/12/11  Diet Order:  NPO  Meds: Scheduled Meds:    . antiseptic oral rinse  15 mL Mouth Rinse Q2H  . atorvastatin  20 mg Per Tube q1800  . baclofen  10 mg Per Tube BID  .  ceFAZolin (ANCEF) IV  3 g Intravenous On Call  . chlorhexidine  15 mL Mouth Rinse BID  . enoxaparin (LOVENOX) injection  60 mg Subcutaneous Q24H  . feeding supplement  30 mL Per Tube TID  . multivitamin  5 mL Per Tube Daily  . neomycin-bacitracin-polymyxin   Topical Daily  . pantoprazole sodium  40 mg Per Tube Q1200  . senna-docusate  1 tablet Oral BID  . DISCONTD: feeding supplement  60 mL Per Tube QID  . DISCONTD: feeding supplement (PROMOTE)  1,000 mL Per Tube Q24H   Continuous Infusions:    . sodium chloride Stopped (10/14/11 1200)  . feeding supplement (JEVITY 1.2 CAL)    . DISCONTD: dextrose Stopped (10/14/11 1100)   PRN Meds:.acetaminophen, albuterol, fentaNYL, hydrALAZINE, midazolam, ondansetron  Labs:  CMP     Component Value Date/Time   NA 140 10/13/2011 0430   K 3.6 10/13/2011 0430   CL 107 10/13/2011 0430   CO2 26 10/13/2011 0430   GLUCOSE 104* 10/13/2011 0430   BUN 23 10/13/2011 0430   CREATININE 0.73 10/13/2011 0430   CALCIUM 9.8 10/13/2011 0430   PROT 8.6* 10/02/2011 2206   ALBUMIN 3.5 10/02/2011 2206   AST 24  10/02/2011 2206   ALT 22 10/02/2011 2206   ALKPHOS 96 10/02/2011 2206   BILITOT 0.3 10/02/2011 2206   GFRNONAA >90 10/13/2011 0430   GFRAA >90 10/13/2011 0430   CBG (last 3)   Basename 10/13/11 1242 10/13/11 0754 10/13/11 0355  GLUCAP 91 99 91   Sodium  Date/Time Value Range Status  10/13/2011  4:30 AM 140  135 - 145 mEq/L Final  10/12/2011  4:15 AM 143  135 - 145 mEq/L Final  10/11/2011  3:38 AM 146* 135 - 145 mEq/L Final    Potassium  Date/Time Value Range Status  10/13/2011  4:30 AM 3.6  3.5 - 5.1 mEq/L Final     DELTA CHECK NOTED  10/12/2011  4:15 AM 4.5  3.5 - 5.1 mEq/L Final  10/11/2011  3:38 AM 4.4  3.5 - 5.1 mEq/L Final    Phosphorus  Date/Time Value Range Status  10/02/2011  5:15 AM 2.6  2.3 - 4.6 mg/dL Final  1/61/0960 45:40 AM 1.5* 2.3 - 4.6 mg/dL Final    Magnesium  Date/Time Value Range Status  10/02/2011  5:15 AM 2.0  1.5 - 2.5 mg/dL Final  9/81/1914 78:29 AM 2.1  1.5 - 2.5 mg/dL Final    Intake/Output  Summary (Last 24 hours) at 10/14/11 1226 Last data filed at 10/14/11 1200  Gross per 24 hour  Intake 1683.33 ml  Output   2422 ml  Net -738.67 ml    Weight Status:  272 lbs on admission with no recent wt changes PTA per wife.  267 lbs 8/29 269 lbs 8/28 272 lbs 8/27  Re-estimated needs:  2000-2400 kcal; 115-130 grams protein  Nutrition Dx:  Inadequate oral intake r/t inability to eat AEB NPO status; ongoing.  Goal: Pt to meet >/= 90% of their estimated nutrition needs; not yet met.  Monitor:  weight, TF tolerance, labs  Kendell Bane RD, LDN, CNSC [phone] Pager [phone] After Hours Pager

## 2011-10-14 NOTE — Progress Notes (Signed)
Patient pulled out his ventriculostomy today. Prior to this it was still draining approximately 5 cc per hour. Followup head CT scan demonstrates stable appearance of his mild ventriculomegaly and thalamic hemorrhage.  On exam he is awake and aware. He is restless but will follow commands on the right side. He is very paretic on the left.  Given that he has protected airway and he is currently awake I would like to observe him and see if he is able to tolerate having his drain out. He should remain in the ICU at least for the next 48 hours while he is under observation.

## 2011-10-15 ENCOUNTER — Inpatient Hospital Stay (HOSPITAL_COMMUNITY): Payer: BC Managed Care – PPO

## 2011-10-15 LAB — CBC
HCT: 32.5 % — ABNORMAL LOW (ref 39.0–52.0)
Hemoglobin: 10.3 g/dL — ABNORMAL LOW (ref 13.0–17.0)
MCH: 29.2 pg (ref 26.0–34.0)
MCHC: 31.7 g/dL (ref 30.0–36.0)
MCV: 92.1 fL (ref 78.0–100.0)
Platelets: 421 10*3/uL — ABNORMAL HIGH (ref 150–400)
RBC: 3.53 MIL/uL — ABNORMAL LOW (ref 4.22–5.81)
RDW: 13.5 % (ref 11.5–15.5)
WBC: 13.3 10*3/uL — ABNORMAL HIGH (ref 4.0–10.5)

## 2011-10-15 LAB — BASIC METABOLIC PANEL
BUN: 24 mg/dL — ABNORMAL HIGH (ref 6–23)
CO2: 28 mEq/L (ref 19–32)
Calcium: 9.8 mg/dL (ref 8.4–10.5)
Chloride: 106 mEq/L (ref 96–112)
Creatinine, Ser: 0.78 mg/dL (ref 0.50–1.35)
GFR calc Af Amer: >90 mL/min (ref >90)
GFR calc non Af Amer: >90 mL/min (ref >90)
Glucose, Bld: 146 mg/dL — ABNORMAL HIGH (ref 70–99)
Potassium: 3.8 mEq/L (ref 3.5–5.1)
Sodium: 144 mEq/L (ref 135–145)

## 2011-10-15 LAB — MAGNESIUM: Magnesium: 2.2 mg/dL (ref 1.5–2.5)

## 2011-10-15 LAB — PHOSPHORUS: Phosphorus: 3.7 mg/dL (ref 2.3–4.6)

## 2011-10-15 LAB — PROCALCITONIN: Procalcitonin: <0.1 ng/mL

## 2011-10-15 MED ORDER — DEXTROSE 5 % IV SOLN
2.0000 g | Freq: Two times a day (BID) | INTRAVENOUS | Status: DC
  Administered 2011-10-15 – 2011-10-19 (×8): 2 g via INTRAVENOUS
  Filled 2011-10-15 (×9): qty 2

## 2011-10-15 MED ORDER — FREE WATER
230.0000 mL | Freq: Four times a day (QID) | Status: DC
  Administered 2011-10-15 – 2011-10-22 (×28): 230 mL

## 2011-10-15 MED ORDER — ACETAMINOPHEN 10 MG/ML IV SOLN
1000.0000 mg | Freq: Four times a day (QID) | INTRAVENOUS | Status: AC
  Administered 2011-10-15 – 2011-10-16 (×4): 1000 mg via INTRAVENOUS
  Filled 2011-10-15 (×4): qty 100

## 2011-10-15 MED ORDER — VANCOMYCIN HCL IN DEXTROSE 1-5 GM/200ML-% IV SOLN
1000.0000 mg | Freq: Three times a day (TID) | INTRAVENOUS | Status: DC
  Administered 2011-10-15 – 2011-10-19 (×11): 1000 mg via INTRAVENOUS
  Filled 2011-10-15 (×13): qty 200

## 2011-10-15 NOTE — Progress Notes (Signed)
Stroke Team Progress Note  HISTORY [name] is a 46 y.o. male  who had been c/o of headaches for few days, with nausea and vomiting,  On 10/01/11 morning according to his wife he had visual disturbance and then he vomited followed by left sided weakness approximately 7 AM. Patient arrived to the ED. Head CT was done that showed, Right BG, bleed with some area involved in thalamus. With minimal mass effect.   According to the wife he has stopped taking his BP medications several years ago and does not go the physician as suggested. Previously, he had significant neurological symptoms with significant elevated BP that could have been attributed to hpertensive encephalopathy.   He had right ventriculostomy, later replacement of tube due to blockage.  SUBJECTIVE ventric removed by patient yesterday, staples required. Today, pt alert, following commands. CT stable per NS.  Filed Vitals:   10/15/11 0600 10/15/11 0700 10/15/11 0715 10/15/11 0800  BP: 145/86 152/86  139/90  Pulse: 96 80  92  Temp:   101.3 F (38.5 C) 101.3 F (38.5 C)  TempSrc:   Oral Oral  Resp: 30 27  30   Height:      Weight:      SpO2: 99% 100%  100%   CBG (last 3)   Basename 10/13/11 1242 10/13/11 0754 10/13/11 0355  GLUCAP 91 99 91   Intake/Output from previous day: 08/29 0701 - 08/30 0700 In: 670 [I.V.:550] Out: 2202 [Urine:1500; Drains:702]  IV Fluid Intake:      . sodium chloride 20 mL/hr at 10/15/11 0700  . feeding supplement (JEVITY 1.2 CAL) 1,000 mL (10/14/11 1457)  . DISCONTD: dextrose Stopped (10/14/11 1100)   Diet:    Tube feedings Activity: bedrest DVT Prophylaxis:  Lovenox 40 mg sq daily   Studies: Results for orders placed during the hospital encounter of 10/01/11 (from the past 24 hour(s))  BASIC METABOLIC PANEL     Status: Abnormal   Collection Time   10/15/11  6:20 AM      Component Value Range   Sodium 144  135 - 145 mEq/L   Potassium 3.8  3.5 - 5.1 mEq/L   Chloride 106  96 -  112 mEq/L   CO2 28  19 - 32 mEq/L   Glucose, Bld 146 (*) 70 - 99 mg/dL   BUN 24 (*) 6 - 23 mg/dL   Creatinine, Ser 1.61  0.50 - 1.35 mg/dL   Calcium 9.8  8.4 - 09.6 mg/dL   GFR calc non Af Amer >90  >90 mL/min   GFR calc Af Amer >90  >90 mL/min  MAGNESIUM     Status: Normal   Collection Time   10/15/11  6:20 AM      Component Value Range   Magnesium 2.2  1.5 - 2.5 mg/dL  PHOSPHORUS     Status: Normal   Collection Time   10/15/11  6:20 AM      Component Value Range   Phosphorus 3.7  2.3 - 4.6 mg/dL  CBC     Status: Abnormal   Collection Time   10/15/11  6:20 AM      Component Value Range   WBC 13.3 (*) 4.0 - 10.5 K/uL   RBC 3.53 (*) 4.22 - 5.81 MIL/uL   Hemoglobin 10.3 (*) 13.0 - 17.0 g/dL   HCT 04.5 (*) 40.9 - 81.1 %   MCV 92.1  78.0 - 100.0 fL   MCH 29.2  26.0 - 34.0 pg  MCHC 31.7  30.0 - 36.0 g/dL   RDW 16.1  09.6 - 04.5 %   Platelets 421 (*) 150 - 400 K/uL  PROCALCITONIN     Status: Normal   Collection Time   10/15/11  6:20 AM      Component Value Range   Procalcitonin <0.10      CSF cx neg Resp Cx few staph aureus Blood Cx no growth Urine Cx neg  CT head  10/13/2011  1. Mild regression and overall expected evolution of right  thalamic hemorrhage. Stable mild edema and mass effect.  2. Right frontal approach EVD remains in place. Stable to  slightly increased blood products along the parenchymal course of  the shunt. IVH has resolved. Stable ventricle size and  configuration.  3. No new intracranial abnormality.   Dg Chest Port 1 View 10/15/2011 Stable portable chest exam 10/12/2011 1. Interval placement of tracheostomy, appropriately positioned  without pneumothorax. 2. Low lung volumes. Patchy right greater than left airspace disease which is favored to represent atelectasis. 3. Cardiomegaly without congestive failure.  Neurological Exam : Mental Status: Alert.  Able to follow 3 step commands without difficulty.  Intubated.  Mitt on right hand. Cranial  Nerves: II-blinks to confrontation less on left than right. III/IV/VI-mild right gaze preference.  Vertical gaze paralysis.  Pupils reactive bilaterally. V/VII-unable to assess VIII-grossly intact IX/X-unable to assess XI-unable to shrug left shoulder XII-unable to assess Motor: flaccid LUE/LLE, 5/5 RUE/RLE Sensory: does not withdraw to noxious stim on left. Deep Tendon Reflexes: 2+ and symmetric throughout Plantars: upgoing on left, downgoing right Cerebellar: unable to assess  ASSESSMENT [name] is a 46 y.o. male with hypertensive right thalamic intracerebral hemorrhage with intraventricular extension with mild hydrocephalus and cytotoxic cerebral edema. Hemorrhage secondary to accelerated hypertension, BP on arrival 164/125.  Neurologically worsening led to IVC placement and intubation. INS had planned for VP shunt early next week, but patient removed himself last night. Patient stable after removal. Remains awake and interactive, following commands. Will monitor for 48 h total and move to floor if CT stable in am. Hope to avoid VP placement.  -VDRF. S/p trach by Dr. Jenne Pane 8/26. On trach collar -low-grade temperature with leukocytosis. Neurosurgeon took sample of CSF. CSF WBCs were 390. RBC 18050, culture was negative. Ucx neg. Blood no growth. On Zosyn (stopped 8/27) and Vanc (8/26). CCM following temp curve.stopped Cx . -History of medication noncompliance. -accelerated hypertension -dysphagia secondary to stroke. PEG placed 8/28 by IR. -Left hemiparesis -left sided clonus, baclofen added  Hospital day # 14  TREATMENT/PLAN -CT in am, if stable move to floor -hope to avoid VP, NS following -add free water 230 ml H20 qid per RD recs    Annie Main, MSN, RN, ANVP-BC, ANP-BC, GNP-BC [facility] Pager: [phone] 10/15/2011 8:27 AM  Scribe for Dr. Delia Heady, Stroke Center Medical Director, who has personally reviewed chart, pertinent data, examined the  patient and developed the plan of care. Pager:  [phone]

## 2011-10-15 NOTE — Progress Notes (Signed)
Fever with leukocytosis and infiltrate on CXR  Initiate antibiotics. Broad spectrum and repeat culture.

## 2011-10-15 NOTE — Progress Notes (Addendum)
Name: Jeremy [name] MRN: [id number] DOB: 1965-09-01    LOS: 14  Referring Provider:  Neuro Reason for Referral:  CVA   PULMONARY / CRITICAL CARE MEDICINE  HPI:  46 yo AAM with PMH of HTN presented to [facility] ED on 8/16 via EMS as a code stroke.  He woke up at 7 am and was unable to ambulate to bathroom, use left hand and was incontinent of urine.  He apparently woke several times during the night to urinate (last at 4am).  He noted right sided headache and vomited in the ambulance.  CT eval demonstrated R basal banglia hemorrhage with minimal surrounding edema.  PCCM consulted for ICU mgmt.    Brief patient description:  46 y/o AAM with HTN admitted with R basal ganglia hemorrhage.   Events Since Admission: 8/16 Admit with basal ganglia hemorrhage 8/20 Intubated for airway protection and elevated ICP with IVC drain placement 8/21 IVC drain replaced d/t prior drain malfunction 8/29 Pt pulled ventric drain  Subjective:  Pulled out ventric drain last pm  Vital Signs: Temp:  [98.9 F (37.2 C)-101.3 F (38.5 C)] 101.3 F (38.5 C) (08/30 0800) Pulse Rate:  [72-96] 80  (08/30 0900) Resp:  [20-30] 26  (08/30 0900) BP: (126-162)/(86-109) 162/90 mmHg (08/30 0900) SpO2:  [96 %-100 %] 99 % (08/30 0900) FiO2 (%):  [35 %] 35 % (08/30 0900)  Intake/Output Summary (Last 24 hours) at 10/15/11 0950 Last data filed at 10/15/11 0900  Gross per 24 hour  Intake    450 ml  Output   2249 ml  Net  -1799 ml    Physical Examination: Gen: ill appearing, awake HEENT: NCAT, PERRL, EOMi, trach in place PULM: scattered rhonchi CV: RRR, no mgr, no JVD AB: BS+, soft, nontender, no hsm Ext: warm, no edema, no clubbing, no cyanosis Derm: no rash or skin breakdown Neuro: awake, interacting  Principal Problem:  *Malignant hypertension requiring acute intensive management Active Problems:  ICH (intracerebral hemorrhage)  HTN (hypertension)  Hypoxemia  Altered mental status  Cytotoxic cerebral edema  Obstructive hydrocephalus  Acute respiratory failure with hypoxia  Elevated intracranial pressure   ASSESSMENT AND PLAN  NEUROLOGIC CPP 71  ICP 9 8/25 8/20  CT Head >> R thalamic hematoma slightly enlarged with surrounding edema; 2mm midline shift, no change to sl smaller volume of blood  CNS DRAINS: IVC drain 8/20>>8/21 replaced >> 8/29 (pt pulled)  A:   Acute R Basal Ganglia Hemorrhage -hemorrhage with small amt edema Acute resp failure d/t Intraventric blood and hydroceph improve with IVC  ELEVATED ICP with IVC drain improved with drainage. Increased output with drain P:   note CSF cultures negative Per neurosurgery >> Ventric drain is out, will follow MS, repeat CT scan on Monday to decide whether he gets VP shunt. No plan transfer out of ICU until this issue resolved Being evaluated for inpatient rehab   PULMONARY No results found for this basename: PHART:5,PCO2:5,PCO2ART:5,PO2ART:5,HCO3:5,O2SAT:5 in the last 168 hours Ventilator Settings: Vent Mode:  [-]  FiO2 (%):  [35 %] 35 % CXR:  none ETT:  8/20>>8/22>>reintubated 8/22>>8/26 Janina Mayo (Dr Jenne Pane) 8/26 >>   A:   Acute respiratory failure d/t hydroceph d/t intraventric blood with thalamic bleed P:   -TC as tolerated -speech rx following PMV, probably won't be able to do well until trach downsize  CARDIOVASCULAR No results found for this basename: TROPONINI:5,LATICACIDVEN:5, O2SATVEN:5,PROBNP:5 in the last 168 hours ECG:  None Lines: L IJ CVL 8/20>>> 8/16 TTE >> limited study, LVEF  60-70%  A: HTN, neg cardiac enzymes,  P:  Prn BP meds ordered Started amlodipine 10mg  qd per tube 8/29   RENAL  Lab 10/15/11 0620 10/13/11 0430 10/12/11 0415 10/11/11 0338 10/10/11 0335  NA 144 140 143 146* 144  K 3.8 3.6 -- -- --  CL 106 107 108 109 107  CO2 28 26 29 30 30   BUN 24* 23 27* 37* 39*  CREATININE 0.78 0.73 0.85 0.88 0.82  CALCIUM 9.8 9.8 9.8 9.7 9.6  MG 2.2 -- -- -- --  PHOS 3.7 -- -- -- --    Intake/Output  Summary (Last 24 hours) at 10/15/11 0950 Last data filed at 10/15/11 0900  Gross per 24 hour  Intake    450 ml  Output   2249 ml  Net  -1799 ml    Foley:  8/16>>>out 8/21>>8/23>>>  A:   Hyponatremia better with IVC  Was prob d/t cerebral salt wasting with hydroceph Hypokalemia  persists P:   Follow bmp  GASTROINTESTINAL No results found for this basename: AST:5,ALT:5,ALKPHOS:5,BILITOT:5,PROT:5,ALBUMIN:5 in the last 168 hours  A:   No active GI issues P:   TF per PEG  HEMATOLOGIC  Lab 10/15/11 0620 10/13/11 0430 10/12/11 0415 10/11/11 0338 10/10/11 0335  HGB 10.3* 9.7* 10.2* 10.5* 11.0*  HCT 32.5* 31.0* 32.0* 33.4* 34.6*  PLT 421* 382 363 348 333  INR -- 1.22 -- -- --  APTT -- -- -- -- --   A:   No acute issues P:  -monitor cbc -ok on LMWH DVT proph   INFECTIOUS  Lab 10/15/11 0620 10/13/11 0430 10/12/11 0415 10/11/11 0338 10/10/11 0335  WBC 13.3* 15.3* 16.2* 13.1* 13.2*  PROCALCITON <0.10 -- -- -- --   Cultures: 8/16 UC>>neg 8/23 CSF>>> negative 8/23 BCx2>>>neg 8/23 Resp c/s>>> rare S aureus  Antibiotics: Zosyn 8/23 (?csf infx)>> 8/27 vanco 8/23 (? Csf )>> 8/26 A:   Fever, ? CSF vs pulm source, CXR with low lung volumes P:   -all cx negative except rare staph in sputum, d/c'd vanco 8/26 and then d/c zosyn on 8/27, follow fever, CBC   ENDOCRINE  Lab 10/13/11 1242 10/13/11 0754 10/13/11 0355 10/13/11 0003 10/12/11 2043  GLUCAP 91 99 91 112* 87   A:   Hyperglycemia P:   -SSI   BEST PRACTICE / DISPOSITION Level of Care:  ICU Primary Service:  PCCM Consultants:  Neurology, ENT Jenne Pane Code Status:  Full Diet:  TF DVT Px:  LMWH  GI Px:  ppi Skin Integrity:  intact Social / Family:    Levy Pupa, MD, PhD 10/15/2011, 9:50 AM [facility] [phone] or if no answer [phone]

## 2011-10-15 NOTE — Progress Notes (Signed)
I have left a message for pt's wife to call me to begin discussion of inpt rehab admission when pt is medically ready. I will begin insurance authorization next week closer to when felt ready to admit. I will follow up on Monday. Please call for any questions. [phone]

## 2011-10-15 NOTE — Progress Notes (Signed)
No new events overnight.  Low-grade fever. Heart rate and respiratory rate normal. Blood pressure control. Urine output good.  Patient is wide awake and alert. He is trying to speak but cannot because this tracheostomy obviously. He follows commands briskly on the right side. He remains densely prepped on the left. His tracheostomy site is dry.  He continues to have a downward gaze preference. His pupils are mid position and briskly reactive to light bilaterally.  Patient has resolving thalamic intracerebral hemorrhage. He is suffered with obstructive hydrocephalus since this hemorrhage. We had been in the process of weaning off his ventriculostomy when the patient removed it on his own. Currently he is undergoing a trial without external ventricular drainage to see if he can tolerate this. Overall her situation seems stable. I would continue ICU observation for now. I will get a followup head CT scan on Monday or Tuesday. If patient shows signs of progressive ventriculomegaly then I would consider VP shunting. Obviously if he compensates over the weekend the patient will require reinsertion of his ventriculostomy.

## 2011-10-15 NOTE — Progress Notes (Signed)
[facility] contacted concerning patient's temperature at 1600, which was 103.1 orally. Dr. Frederico Hamman, who would review findings to determine if new orders were needed. Will continue to monitor patient.

## 2011-10-15 NOTE — Progress Notes (Signed)
Patient was febrile this am at 101.3. Tylenol 650mg  was given via PEG tube, without effect. 1200 temperature was 102.8 orally. Dr. Delton Coombes contacted with findings. No new orders, but he stated that he would look into restarting antibiotic treatment. Will continue to monitor.

## 2011-10-15 NOTE — Evaluation (Signed)
Speech Language Pathology Evaluation Patient Details Name: Kadeen Sroka MRN: [id number] DOB: [birth year] Today's Date: 10/15/2011 Time: 4098-1191 SLP Time Calculation (min): 42 min  Problem List:  Patient Active Problem List  Diagnosis  . ICH (intracerebral hemorrhage)  . HTN (hypertension)  . Hypoxemia  . Altered mental status  . Cytotoxic cerebral edema  . Obstructive hydrocephalus  . Malignant hypertension requiring acute intensive management  . Acute respiratory failure with hypoxia  . Elevated intracranial pressure   Past Medical History:  Past Medical History  Diagnosis Date  . Hypertension   . Stroke    Past Surgical History:  Past Surgical History  Procedure Date  . Tracheostomy tube placement 10/11/2011    Procedure: TRACHEOSTOMY;  Surgeon: Christia Reading, MD;  Location: [facility] OR;  Service: ENT;  Laterality: N/A;   HPI:  46 y/o AAM with HTN admitted with R basal ganglia hemorrhage with extension to the right thalamus and posterior third ventricle. Right frontal ventriculostomy placed 8/20 due to obstructive hydrocephalus. Pt intubated from 8/20 to 8/27. Tracheostomy on 10/12/11. Prior to extension, SLP evaluated swallow at bedside and recommended a regualr diet with thin liquids.    Assessment / Plan / Recommendation Clinical Impression  Pt presents with primary cognitive deficits consistent of right hemorrhagic CVA with appearance of adequate linguistic function though this is difficult to assess given that pt is aphonic with tracheostomy without tolerance of PMSV. Pt with left neglect and downward visual gaze. Pt demonstrate moderate intellcutal awareness of situation but emergent awareness of deficits and safety awareness are poor. Pt with decreased sustained attention with complex verbal and functional tasks. Problem solving moderately impaired. Pt will require SLP in acute setting to maximize function for next level of care. Recommend d/c to CIR.     SLP Assessment  Patient needs continued Speech Lanaguage Pathology Services    Follow Up Recommendations  Inpatient Rehab    Frequency and Duration min 3x week  2 weeks   Pertinent Vitals/Pain NA   SLP Goals  SLP Goals Potential to Achieve Goals: Good Potential Considerations: Previous level of function;Family/community support Progress/Goals/Alternative treatment plan discussed with pt/caregiver and they: Agree SLP Goal #1: Pt will complete basic problem solving during functional tasks x2  with moderate contextual cues. SLP Goal #2: Pt will demonstrate emergent awareness of deficits during basic functional tasks with moderate contextual cues x3.  SLP Goal #3: Pt will follow multistep commands with min verbal cues during functional tasks x3.   SLP Evaluation Prior Functioning  Cognitive/Linguistic Baseline: Within functional limits Type of Home: House Lives With: Spouse Available Help at Discharge: Family;Available 24 hours/day Education: 12th grade education Vocation: Full time employment   Cognition  Overall Cognitive Status: Impaired Arousal/Alertness: Awake/alert Orientation Level: Oriented to person;Oriented to place;Oriented to situation Attention: Sustained Focused Attention: Appears intact Sustained Attention: Impaired Sustained Attention Impairment: Verbal complex;Functional complex Memory: Impaired Memory Impairment: Decreased recall of new information Decreased Short Term Memory: Verbal basic;Functional basic Awareness: Impaired Awareness Impairment: Emergent impairment;Anticipatory impairment;Intellectual impairment Problem Solving: Impaired Problem Solving Impairment: Functional basic;Verbal complex Executive Function: Reasoning;Self Monitoring;Self Correcting Reasoning: Impaired Reasoning Impairment: Verbal basic;Functional basic Sequencing: Appears intact Self Monitoring: Impaired Self Monitoring Impairment: Functional basic Self Correcting: Impaired Self Correcting  Impairment: Functional basic Behaviors: Restless;Impulsive;Perseveration Safety/Judgment: Impaired    Comprehension  Auditory Comprehension Overall Auditory Comprehension: Impaired Yes/No Questions: Within Functional Limits Basic Biographical Questions: 76-100% accurate Basic Immediate Environment Questions: 75-100% accurate Complex Questions: Not tested Paragraph Comprehension (via yes/no questions): Not tested Commands: Impaired  One Step Basic Commands: 75-100% accurate Two Step Basic Commands: 75-100% accurate Multistep Basic Commands: 0-24% accurate Conversation: Simple Interfering Components: Attention;Visual impairments;Working Radio broadcast assistant: Extra processing time;Repetition    Expression Expression Primary Mode of Expression: Nonverbal - gestures (Aphonic, mouthing words) Verbal Expression Overall Verbal Expression: Appears within functional limits for tasks assessed Initiation: No impairment Automatic Speech: Name;Social Response;Counting;Day of week Level of Generative/Spontaneous Verbalization: Phrase Repetition: No impairment Naming: No impairment Pragmatics: No impairment Interfering Components: Attention;Tracheostomy without PMSV   Oral / Motor Oral Motor/Sensory Function Overall Oral Motor/Sensory Function: Appears within functional limits for tasks assessed Motor Speech Overall Motor Speech: Other (comment) (difficult to assess given aphonia) Intelligibility: Intelligibility reduced Word: 0-24% accurate Phrase: 0-24% accurate Sentence: 0-24% accurate Conversation: 0-24% accurate   GO     Medea Deines, [name] 10/15/2011, 10:43 AM

## 2011-10-15 NOTE — Progress Notes (Signed)
Passy-Muir Speaking Valve - Treatment Patient Details  Name: Jeremy [name] MRN: [id number] Date of Birth: [birth year]  Today's Date: 10/15/2011 Time: 0930-1012 SLP Time Calculation (min): 42 min  Past Medical History:  Past Medical History  Diagnosis Date  . Hypertension   . Stroke    Past Surgical History:  Past Surgical History  Procedure Date  . Tracheostomy tube placement 10/11/2011    Procedure: TRACHEOSTOMY;  Surgeon: Christia Reading, MD;  Location: [facility] OR;  Service: ENT;  Laterality: N/A;    Assessment / Plan / Recommendation Clinical Impression  Pt with minimally improved tolerance of PMSV this session. Pt redirected air to upper airway with tactile sensation though phonation minimal at single word level. Suspect cognition and dysphonia due to intubation may also be factors here.  One instance of overt CO2 trapping noticed though pt with increased anxiety with valve in place, increased repiratoy rate. Pt will likely not tolerate valve placement for sustained length of tme until trach downsized to a 6 shiley (hopefully cuffless). SLP will continue to follow.     Plan  Continue with current plan of care    Follow Up Recommendations  Inpatient Rehab    Pertinent Vitals/Pain NA    SLP Goals SLP Goal #1: Pt will tolerate placement of PMSV for 30 minutes without change in vital signs or overt CO2 trapping with moderate assist from SLP.  SLP Goal #1 - Progress: Progressing toward goal SLP Goal #2: Pt will verbalize at conversation level with adequate breath support with moderate verbal cues from SLP SLP Goal #2 - Progress: Progressing toward goal SLP Goal #3: Pt will orally expectorate secretions with moderate verbal cues from SLP.  SLP Goal #3 - Progress: Progressing toward goal   PMSV Trial  PMSV was placed for: 1-2 minute intervals Able to redirect subglottic air through upper airway: Yes Able to Attain Phonation: Yes (minimally) Voice Quality: Aphonic Able to  Expectorate Secretions: Yes Level of Secretion Expectoration with PMSV: Tracheal Breath Support for Phonation: Moderately decreased Intelligibility: Intelligibility reduced Word: 0-24% accurate Phrase: 0-24% accurate Sentence: 0-24% accurate Conversation: 0-24% accurate Respirations During Trial: 33  SpO2 During Trial: 96 % Behavior: Alert   Tracheostomy Tube       Vent Dependency  Vent Dependent: No FiO2 (%): 35 % Nocturnal Vent: No    Cuff Deflation Trial  GO     Tolerated Cuff Deflation: Yes Length of Time for Cuff Deflation Trial: baseline Behavior: Alert;Confused;Listless   Jeremy [name], [name] 10/15/2011, 10:26 AM  Speech Language Pathology Dysphagia Treatment Patient Details Name: Jeremy [name] MRN: [id number] DOB: March 11, 1965 Today's Date: 10/15/2011 Time: 7829-5621 SLP Time Calculation (min): 13 min  Assessment / Plan / Recommendation Clinical Impression  Pt completed oral care with min verbal cues from SLP. Trials of ice chips via teaspoon provided with adequate oral transit and timely swallow response with PMSV in place. pt with immediate cough and watery eyes. Ssupect come laryngeal edema s/p prolonged intubation, possible pharyngeal s/p extension of hemorrhagic CVA. Will not yet pursue objective testing in order to provide pt more time to recover post extubation. Will aim for FEES when pt has trach downsized to a 6 Shiley and hopefully can tolerate PMSV for PO intake.     Diet Recommendation  Continue with Current Diet: NPO    SLP Plan Continue with current plan of care   Pertinent Vitals/Pain NA   Swallowing Goals  SLP Swallowing Goals Goal #3: Pt will consume trials of ice chips/water after oral  care with PMSV in place to determine readiness for FEES or PO intake.  Swallow Study Goal #3 - Progress: Progressing toward goal  General Temperature Spikes Noted: Yes Respiratory Status: Trach Behavior/Cognition: Alert;Cooperative;Requires  cueing;Confused Oral Cavity - Dentition: Missing dentition Patient Positioning: Upright in bed  Oral Cavity - Oral Hygiene Does patient have any of the following "at risk" factors?: None of the above Patient is HIGH RISK - Oral Care Protocol followed (see row info): Yes   Dysphagia Treatment Treatment focused on: Facilitation of pharyngeal phase;Facilitation of oral phase Treatment Methods/Modalities: Skilled observation Patient observed directly with PO's: Yes Type of PO's observed: Ice chips Liquids provided via: Teaspoon Pharyngeal Phase Signs & Symptoms: Watery eyes;Immediate cough;Changes in respirations Type of cueing: Verbal Amount of cueing: Moderate   GO     Jeremy [name], [name] 10/15/2011, 10:18 AM

## 2011-10-15 NOTE — Progress Notes (Addendum)
ANTIBIOTIC CONSULT NOTE - INITIAL  Pharmacy Consult for vancomycin/cefepime Indication: rule out pneumonia  No Known Allergies  Patient Measurements: Height: 5\' 10"  (177.8 cm) Weight: 267 lb 6.7 oz (121.3 kg) IBW/kg (Calculated) : 73   Vital Signs: Temp: 103.1 F (39.5 C) (08/30 1600) Temp src: Oral (08/30 1600) BP: 141/75 mmHg (08/30 1700) Pulse Rate: 103  (08/30 1700) Intake/Output from previous day: 08/29 0701 - 08/30 0700 In: 670 [I.V.:550] Out: 2202 [Urine:1500; Drains:702] Intake/Output from this shift: Total I/O In: 200 [I.V.:200] Out: 1235 [Urine:425; Drains:810]  Labs:  [facility] 10/15/11 0620 10/13/11 0430  WBC 13.3* 15.3*  HGB 10.3* 9.7*  PLT 421* 382  LABCREA -- --  CREATININE 0.78 0.73   Estimated Creatinine Clearance: 152.2 ml/min (by C-G formula based on Cr of 0.78). No results found for this basename: VANCOTROUGH:2,VANCOPEAK:2,VANCORANDOM:2,GENTTROUGH:2,GENTPEAK:2,GENTRANDOM:2,TOBRATROUGH:2,TOBRAPEAK:2,TOBRARND:2,AMIKACINPEAK:2,AMIKACINTROU:2,AMIKACIN:2, in the last 72 hours   Microbiology: Recent Results (from the past 720 hour(s))  URINE CULTURE     Status: Normal   Collection Time   10/01/11  8:52 AM      Component Value Range Status Comment   Specimen Description URINE, CATHETERIZED   Final    Special Requests NONE   Final    Culture  Setup Time 10/01/2011 14:57   Final    Colony Count NO GROWTH   Final    Culture NO GROWTH   Final    Report Status 10/02/2011 FINAL   Final   MRSA PCR SCREENING     Status: Normal   Collection Time   10/01/11 10:11 AM      Component Value Range Status Comment   MRSA by PCR NEGATIVE  NEGATIVE Final   CSF CULTURE     Status: Normal   Collection Time   10/08/11  7:42 AM      Component Value Range Status Comment   Specimen Description CSF   Final    Special Requests 0.5ML   Final    Gram Stain     Final    Value: CYTOSPIN WBC PRESENT, PREDOMINANTLY PMN     NO ORGANISMS SEEN     Performed at [facility]   Culture NO GROWTH 3 DAYS   Final    Report Status 10/11/2011 FINAL   Final   GRAM STAIN     Status: Normal   Collection Time   10/08/11  7:42 AM      Component Value Range Status Comment   Specimen Description CSF   Final    Special Requests 0.5ML   Final    Gram Stain     Final    Value: CYTOSPIN SLIDE     WBC PRESENT, PREDOMINANTLY PMN     NO ORGANISMS SEEN   Report Status 10/08/2011 FINAL   Final   CULTURE, RESPIRATORY     Status: Normal   Collection Time   10/08/11  8:18 AM      Component Value Range Status Comment   Specimen Description TRACHEAL ASPIRATE   Final    Special Requests NONE   Final    Gram Stain     Final    Value: FEW WBC PRESENT, PREDOMINANTLY PMN     NO SQUAMOUS EPITHELIAL CELLS SEEN     RARE GRAM POSITIVE COCCI     IN PAIRS IN CLUSTERS   Culture     Final    Value: FEW STAPHYLOCOCCUS AUREUS     Note: RIFAMPIN AND GENTAMICIN SHOULD NOT BE USED AS SINGLE DRUGS FOR TREATMENT OF  STAPH INFECTIONS.   Report Status 10/10/2011 FINAL   Final    Organism ID, Bacteria STAPHYLOCOCCUS AUREUS   Final   URINE CULTURE     Status: Normal   Collection Time   10/08/11  9:22 AM      Component Value Range Status Comment   Specimen Description URINE, CATHETERIZED   Final    Special Requests NONE   Final    Culture  Setup Time 10/08/2011 15:18   Final    Colony Count NO GROWTH   Final    Culture NO GROWTH   Final    Report Status 10/08/2011 FINAL   Final   CULTURE, BLOOD (ROUTINE X 2)     Status: Normal   Collection Time   10/08/11 10:50 AM      Component Value Range Status Comment   Specimen Description BLOOD RIGHT ANTECUBITAL   Final    Special Requests BOTTLES DRAWN AEROBIC ONLY 5CC   Final    Culture  Setup Time 10/08/2011 14:23   Final    Culture NO GROWTH 5 DAYS   Final    Report Status 10/14/2011 FINAL   Final   CULTURE, BLOOD (ROUTINE X 2)     Status: Normal   Collection Time   10/08/11 11:00 AM      Component Value Range Status Comment   Specimen  Description BLOOD LEFT ANTECUBITAL   Final    Special Requests BOTTLES DRAWN AEROBIC ONLY 5CC   Final    Culture  Setup Time 10/08/2011 14:23   Final    Culture NO GROWTH 5 DAYS   Final    Report Status 10/14/2011 FINAL   Final     Medical History: Past Medical History  Diagnosis Date  . Hypertension   . Stroke     Medications:  Scheduled:    . antiseptic oral rinse  15 mL Mouth Rinse Q2H  . atorvastatin  20 mg Per Tube q1800  . baclofen  10 mg Per Tube BID  . chlorhexidine  15 mL Mouth Rinse BID  . enoxaparin (LOVENOX) injection  60 mg Subcutaneous Q24H  . feeding supplement  30 mL Per Tube TID  . free water  230 mL Per Tube QID  . multivitamin  5 mL Per Tube Daily  . neomycin-bacitracin-polymyxin   Topical Daily  . pantoprazole sodium  40 mg Per Tube Q1200  . senna-docusate  1 tablet Oral BID   Infusions:    . sodium chloride 20 mL (10/15/11 1026)  . feeding supplement (JEVITY 1.2 CAL) 1,000 mL (10/14/11 1457)   Assessment: 46 yo M known to pharmacy from previous antibiotic dosing.  Now asked to resume vancomycin dosing and initiate cefepime.  Previously, vancomycin dose of 1250 mg IV q12h lead to trough of 8.4.  Goal of Therapy:  Vancomycin trough level 15-20 mcg/ml  Plan:  - Vanomcycin 1g IV q8h - Cefepime 2g IV q12h - Follow up SCr, UOP, cultures, clinical course and adjust as clinically indicated.   Alison L. Illene Bolus, PharmD, BCPS Clinical Pharmacist Pager: [phone] Pharmacy: [phone] 10/15/2011 6:02 PM

## 2011-10-16 DIAGNOSIS — R509 Fever, unspecified: Secondary | ICD-10-CM | POA: Diagnosis not present

## 2011-10-16 LAB — URINALYSIS, ROUTINE W REFLEX MICROSCOPIC
Glucose, UA: NEGATIVE mg/dL
Hgb urine dipstick: NEGATIVE
Ketones, ur: 15 mg/dL — AB
Leukocytes, UA: NEGATIVE
Nitrite: NEGATIVE
Protein, ur: 30 mg/dL — AB
Specific Gravity, Urine: 1.035 — ABNORMAL HIGH (ref 1.005–1.030)
Urobilinogen, UA: 4 mg/dL — ABNORMAL HIGH (ref 0.0–1.0)
pH: 5.5 (ref 5.0–8.0)

## 2011-10-16 LAB — CBC
HCT: 30.4 % — ABNORMAL LOW (ref 39.0–52.0)
Hemoglobin: 9.6 g/dL — ABNORMAL LOW (ref 13.0–17.0)
MCH: 29 pg (ref 26.0–34.0)
MCHC: 31.6 g/dL (ref 30.0–36.0)
MCV: 91.8 fL (ref 78.0–100.0)
Platelets: 398 10*3/uL (ref 150–400)
RBC: 3.31 MIL/uL — ABNORMAL LOW (ref 4.22–5.81)
RDW: 13.4 % (ref 11.5–15.5)
WBC: 17.5 10*3/uL — ABNORMAL HIGH (ref 4.0–10.5)

## 2011-10-16 LAB — URINE MICROSCOPIC-ADD ON

## 2011-10-16 LAB — BASIC METABOLIC PANEL
BUN: 27 mg/dL — ABNORMAL HIGH (ref 6–23)
CO2: 30 mEq/L (ref 19–32)
Calcium: 9.6 mg/dL (ref 8.4–10.5)
Chloride: 106 mEq/L (ref 96–112)
Creatinine, Ser: 0.79 mg/dL (ref 0.50–1.35)
GFR calc Af Amer: >90 mL/min (ref >90)
GFR calc non Af Amer: >90 mL/min (ref >90)
Glucose, Bld: 121 mg/dL — ABNORMAL HIGH (ref 70–99)
Potassium: 3.4 mEq/L — ABNORMAL LOW (ref 3.5–5.1)
Sodium: 142 mEq/L (ref 135–145)

## 2011-10-16 MED ORDER — SODIUM CHLORIDE 0.9 % IJ SOLN
10.0000 mL | Freq: Two times a day (BID) | INTRAMUSCULAR | Status: DC
  Administered 2011-10-16 – 2011-10-17 (×3): 10 mL
  Administered 2011-10-18: 20 mL
  Administered 2011-10-18: 10 mL

## 2011-10-16 MED ORDER — SODIUM CHLORIDE 0.9 % IJ SOLN
10.0000 mL | INTRAMUSCULAR | Status: DC | PRN
  Administered 2011-10-19: 20 mL
  Administered 2011-10-21 – 2011-10-22 (×4): 10 mL

## 2011-10-16 MED ORDER — FENTANYL CITRATE 0.05 MG/ML IJ SOLN
INTRAMUSCULAR | Status: AC
  Administered 2011-10-16: 25 ug via INTRAVENOUS
  Filled 2011-10-16: qty 2

## 2011-10-16 MED ORDER — FENTANYL CITRATE 0.05 MG/ML IJ SOLN
25.0000 ug | Freq: Once | INTRAMUSCULAR | Status: AC
  Administered 2011-10-16: 25 ug via INTRAVENOUS

## 2011-10-16 NOTE — Progress Notes (Signed)
Name: Jeremy [name] MRN: [id number] DOB: 10-Dec-1965    LOS: 15  Referring Provider:  Neuro Reason for Referral:  CVA   PULMONARY / CRITICAL CARE MEDICINE  HPI:  46 yo male admitted 10/01/2011 with unsteady gait, and left hand weakness associated with headache and vomiting.  He was found to have Rt basal ganglia ICH.  Events Since Admission: 8/16 Admit with basal ganglia hemorrhage 8/20 Intubated for airway protection and elevated ICP with IVC drain placement 8/21 IVC drain replaced d/t prior drain malfunction 8/29 Pt pulled ventric drain 8/30 Abx started for Tm 103.1  Subjective:  Remains off vent.  Abx started overnight for fevers.  Vital Signs: Temp:  [98.8 F (37.1 C)-103.1 F (39.5 C)] 98.8 F (37.1 C) (08/31 0755) Pulse Rate:  [65-109] 90  (08/31 0755) Resp:  [16-37] 16  (08/31 0755) BP: (123-175)/(72-92) 132/74 mmHg (08/31 0755) SpO2:  [94 %-100 %] 98 % (08/31 0755) FiO2 (%):  [28 %-35 %] 28 % (08/31 0755) Weight:  [259 lb 11.2 oz (117.8 kg)] 259 lb 11.2 oz (117.8 kg) (08/31 0600)  Intake/Output Summary (Last 24 hours) at 10/16/11 0805 Last data filed at 10/16/11 0700  Gross per 24 hour  Intake   2180 ml  Output   1420 ml  Net    760 ml    Physical Examination: Gen: Ill appearing HEENT: Trach site clean PULM: Decreased breath sounds, scattered rhonchi CV: s1s2 regular, no murmur AB: soft, non tender, +bowel sounds, PEG site clean Ext: no edema Derm: no rashes Neuro: Awake, follows some commands  Ct Head Wo Contrast  10/15/2011  **ADDENDUM** CREATED: 10/15/2011 23:12:31  Findings were discussed with Dr. Newell Coral as the tiny hyperdense focus just inside the right frontal skull likely represents a small bone fragment secondary to bone drilling.  **END ADDENDUM** SIGNED BY: Melton Alar. Micheline Maze, M.D.   10/15/2011  *RADIOLOGY REPORT*  Clinical Data: Change in mental status.  CT HEAD WITHOUT CONTRAST  Technique:  Contiguous axial images were obtained from the base of  the skull through the vertex without contrast.  Comparison: 10/13/2011  Findings:  There is mild motion artifact.  Exam demonstrates interval removal of a right frontal ventriculostomy catheter. There is minimal air within the right frontal horn and temporal horn.  There is minimal hyperdensity along the ventriculostomy tract in the right frontal lobe, unchanged.  There is no significant change in patient's right thalamic hemorrhage measuring 1.4 x 3 cm in AP and transverse dimension with mild adjacent edema and mass effect. There is no change in a tiny 3 mm hyperdense focus near the origin of the ventriculostomy tract in the right frontal lobe.  There is a 6 mm dense focus just inside the skull over the right frontal lobe where the ventriculostomy catheter has been removed as this that may represent small retained foreign body. Remainder of the exam is unchanged.  IMPRESSION: Stable right thalamic acute to subacute hemorrhage measuring 1.4 x 3 cm with mild adjacent edema and mass effect.  Interval removal of a right frontal ventriculostomy catheter with stable minimal hemorrhage along the ventriculostomy tract in the right frontal lobe.  The 6 mm dense focus along the tract just inside the frontal skull which may represent foreign body.   Original Report Authenticated By: Elba Barman, M.D.    Dg Chest Port 1 View  10/15/2011  *RADIOLOGY REPORT*  Clinical Data: Tracheostomy, atelectasis  PORTABLE CHEST - 1 VIEW  Comparison: 10/12/2011  Findings: Stable tracheostomy and left IJ  central line.  Left IJ central line tip slightly retracted in the left innominate vein. Heart is enlarged with vascular congestion, low lung volumes and basilar atelectasis.  No enlarging effusion or pneumothorax.  No significant interval change.  IMPRESSION: Stable portable chest exam   Original Report Authenticated By: Judie Petit. Ruel Favors, M.D.      ASSESSMENT AND PLAN  NEUROLOGIC  CNS DRAINS: IVC drain 8/20>>8/21 replaced >> 8/29  (pt pulled)  A: Acute Rt basal ganglia hemorrhage with hydrocephalus and elevated ICP s/p IVC>>pt pulled drain on 8/29. P:   Monitor will IVC drain out Plan for f/u CT head next week if he remains stable Will need rehab when more stable  PULMONARY  ETT:  8/20>>8/22>>reintubated 8/22>>8/26 Trach (Dr Jenne Pane) 8/26 >>   A: Acute respiratory failure due to mental status change and difficulty to protect airway.  S/p trach by ENT.  Has been off vent since 8/28. P:   Continue trach collar as tolerated Titrate oxygen to keep SpO2 > 92% Will need further eval with speech therapy at some point when more stable F/u CXR intermittently  CARDIOVASCULAR  Lines: L IJ CVL 8/20>>>  8/16 TTE >> limited study, LVEF 60-70%  A: HTN. P:  Prn BP meds ordered  RENAL  Lab 10/16/11 0610 10/15/11 0620 10/13/11 0430 10/12/11 0415 10/11/11 0338  NA 142 144 140 143 146*  K 3.4* 3.8 -- -- --  CL 106 106 107 108 109  CO2 30 28 26 29 30   BUN 27* 24* 23 27* 37*  CREATININE 0.79 0.78 0.73 0.85 0.88  CALCIUM 9.6 9.8 9.8 9.8 9.7  MG -- 2.2 -- -- --  PHOS -- 3.7 -- -- --    Intake/Output Summary (Last 24 hours) at 10/16/11 0805 Last data filed at 10/16/11 0700  Gross per 24 hour  Intake   2180 ml  Output   1420 ml  Net    760 ml    Foley:  8/16>>>out 8/21>>8/23>>>  A: Hyponatremia likely from cerebral salt wasting>>resolved. Hypokalemia. P:   F/u and replace electrolytes as needed  GASTROINTESTINAL  PEG 8/28>>  A:  Dysphagia. Nutrition. P:   Continue tube feeds via PEG.  HEMATOLOGIC  Lab 10/16/11 0610 10/15/11 0620 10/13/11 0430 10/12/11 0415 10/11/11 0338  HGB 9.6* 10.3* 9.7* 10.2* 10.5*  HCT 30.4* 32.5* 31.0* 32.0* 33.4*  PLT 398 421* 382 363 348  INR -- -- 1.22 -- --  APTT -- -- -- -- --   A:  Anemia. P:  F/u CBC  INFECTIOUS  Lab 10/16/11 0610 10/15/11 0620 10/13/11 0430 10/12/11 0415 10/11/11 0338  WBC 17.5* 13.3* 15.3* 16.2* 13.1*  PROCALCITON -- <0.10 -- -- --    Cultures: 8/16 UC>>neg 8/23 CSF>>> negative 8/23 BCx2>>>neg 8/23 Resp c/s>>> rare S aureus 8/30 Blood>> 8/30 Urine>> 8/30 C diff>>  Antibiotics: Zosyn 8/23 (?csf infx)>> 8/27 vanco 8/23 (? Csf )>> 8/26 Vancomycin 8/30>> Cefepime 8/30>>  A:  Persistent fever, ?source. P:   Will have IV team place PICC line, and then d/c Lt IJ CVL Continue vancomycin, cefepime pending culture results  ENDOCRINE  Lab 10/13/11 1242 10/13/11 0754 10/13/11 0355 10/13/11 0003 10/12/11 2043  GLUCAP 91 99 91 112* 87   A:  Hyperglycemia P:   -SSI   BEST PRACTICE / DISPOSITION Level of Care:  ICU Primary Service:  PCCM Consultants:  Neurology, ENT Jenne Pane, Neurosurgery [facility]) Code Status:  Full Diet:  TF DVT Px:  LMWH  GI Px:  ppi Skin  Integrity:  intact Social / Family:   Coralyn Helling, MD [facility] Pulmonary/Critical Care 10/16/2011, 8:21 AM Pager:  [phone] After 3pm call: [phone]

## 2011-10-16 NOTE — Progress Notes (Signed)
Stroke Team Progress Note  HISTORY [name] is a 46 y.o. male  who had been c/o of headaches for few days, with nausea and vomiting,  On 10/01/11 morning according to his wife he had visual disturbance and then he vomited followed by left sided weakness approximately 7 AM. Patient arrived to the ED. Head CT was done that showed, Right BG, bleed with some area involved in thalamus. With minimal mass effect.   According to the wife he has stopped taking his BP medications several years ago and does not go the physician as suggested. Previously, he had significant neurological symptoms with significant elevated BP that could have been attributed to hpertensive encephalopathy.   He had right ventriculostomy, later replacement of tube due to blockage.  SUBJECTIVE The patient is awake and he is following commands, he does not indicate that he is in any discomfort. Trach in place, nonverbal.  Filed Vitals:   10/16/11 0500 10/16/11 0600 10/16/11 0700 10/16/11 0755  BP: 136/74 145/84 132/74 132/74  Pulse: 65 80 78 90  Temp:    98.8 F (37.1 C)  TempSrc:    Oral  Resp:    16  Height:      Weight:  117.8 kg (259 lb 11.2 oz)    SpO2: 100% 100% 95% 98%   CBG (last 3)   Basename 10/13/11 1242  GLUCAP 91   Intake/Output from previous day: 08/30 0701 - 08/31 0700 In: 2200 [I.V.:500; NG/GT:290; IV Piggyback:750] Out: 1625 [Urine:575; Drains:1050]  IV Fluid Intake:      . sodium chloride 20 mL (10/15/11 1026)  . feeding supplement (JEVITY 1.2 CAL) 1,000 mL (10/15/11 2126)   Diet:    Tube feedings Activity: bedrest DVT Prophylaxis:  Lovenox 40 mg sq daily   Studies: Results for orders placed during the hospital encounter of 10/01/11 (from the past 24 hour(s))  URINALYSIS, ROUTINE W REFLEX MICROSCOPIC     Status: Abnormal   Collection Time   10/16/11 12:10 AM      Component Value Range   Color, Urine ORANGE (*) YELLOW   APPearance CLEAR  CLEAR   Specific Gravity, Urine 1.035  (*) 1.005 - 1.030   pH 5.5  5.0 - 8.0   Glucose, UA NEGATIVE  NEGATIVE mg/dL   Hgb urine dipstick NEGATIVE  NEGATIVE   Bilirubin Urine SMALL (*) NEGATIVE   Ketones, ur 15 (*) NEGATIVE mg/dL   Protein, ur 30 (*) NEGATIVE mg/dL   Urobilinogen, UA 4.0 (*) 0.0 - 1.0 mg/dL   Nitrite NEGATIVE  NEGATIVE   Leukocytes, UA NEGATIVE  NEGATIVE  URINE MICROSCOPIC-ADD ON     Status: Normal   Collection Time   10/16/11 12:10 AM      Component Value Range   Squamous Epithelial / LPF RARE  RARE   WBC, UA 0-2  <3 WBC/hpf   RBC / HPF 0-2  <3 RBC/hpf   Bacteria, UA RARE  RARE   Urine-Other MUCOUS PRESENT    BASIC METABOLIC PANEL     Status: Abnormal   Collection Time   10/16/11  6:10 AM      Component Value Range   Sodium 142  135 - 145 mEq/L   Potassium 3.4 (*) 3.5 - 5.1 mEq/L   Chloride 106  96 - 112 mEq/L   CO2 30  19 - 32 mEq/L   Glucose, Bld 121 (*) 70 - 99 mg/dL   BUN 27 (*) 6 - 23 mg/dL   Creatinine, Ser 1.47  0.50 - 1.35 mg/dL   Calcium 9.6  8.4 - 40.9 mg/dL   GFR calc non Af Amer >90  >90 mL/min   GFR calc Af Amer >90  >90 mL/min  CBC     Status: Abnormal   Collection Time   10/16/11  6:10 AM      Component Value Range   WBC 17.5 (*) 4.0 - 10.5 K/uL   RBC 3.31 (*) 4.22 - 5.81 MIL/uL   Hemoglobin 9.6 (*) 13.0 - 17.0 g/dL   HCT 81.1 (*) 91.4 - 78.2 %   MCV 91.8  78.0 - 100.0 fL   MCH 29.0  26.0 - 34.0 pg   MCHC 31.6  30.0 - 36.0 g/dL   RDW 95.6  21.3 - 08.6 %   Platelets 398  150 - 400 K/uL    CSF cx neg Resp Cx few staph aureus Blood Cx no growth Urine Cx neg  CT head  10/13/2011  1. Mild regression and overall expected evolution of right  thalamic hemorrhage. Stable mild edema and mass effect.  2. Right frontal approach EVD remains in place. Stable to  slightly increased blood products along the parenchymal course of  the shunt. IVH has resolved. Stable ventricle size and  configuration.  3. No new intracranial abnormality  8/30 CT head  IMPRESSION:  Stable right  thalamic acute to subacute hemorrhage measuring 1.4 x  3 cm with mild adjacent edema and mass effect.  Interval removal of a right frontal ventriculostomy catheter with  stable minimal hemorrhage along the ventriculostomy tract in the  right frontal lobe. The 6 mm dense focus along the tract just  inside the frontal skull which may represent foreign body.    Dg Chest Port 1 View 10/15/2011 Stable portable chest exam 10/12/2011 1. Interval placement of tracheostomy, appropriately positioned  without pneumothorax. 2. Low lung volumes. Patchy right greater than left airspace disease which is favored to represent atelectasis. 3. Cardiomegaly without congestive failure.  Neurological Exam : Mental Status: Alert.  Able to follow 3 step commands without difficulty.  Intubated.  Mitt on right hand. Cranial Nerves: II-blinks to confrontation less on left than right. III/IV/VI-mild right gaze preference.  Vertical gaze paralysis, eyes are deviated medially and inferiorly.  Pupils reactive bilaterally.  Motor: flaccid LUE/LLE, 5/5 RUE/RLE will follow verbal commands Sensory: does not withdraw to noxious stim on left. Deep Tendon Reflexes: 2+ and symmetric throughout Plantars: upgoing on left, downgoing right Cerebellar: unable to assess  ASSESSMENT [name] is a 46 y.o. male with hypertensive right thalamic intracerebral hemorrhage with intraventricular extension with mild hydrocephalus and cytotoxic cerebral edema. Hemorrhage secondary to accelerated hypertension, BP on arrival 164/125.  Neurologically worsening led to IVC placement and intubation. INS had planned for VP shunt early next week, but patient removed himself last night. Patient stable after removal. Remains awake and interactive, following commands.  Fevers to 103.1 last PM, LLL infiltrate, WBC up to 17K. Now on antibiotics.  -VDRF. S/p trach by Dr. Jenne Pane 8/26. On trach collar ST following. PM valve in use.. -History of  medication noncompliance. -accelerated hypertension -dysphagia secondary to stroke. PEG placed 8/28 by IR. -Left hemiparesis -left sided clonus, baclofen added  CT head done shows actual improvement of ventricular size following extraction of the ventriculostomy  Hospital day # 15  TREATMENT/PLAN -CT for Monday, NS following, recommend watch in unit until Monday, and if stable, ok to move out. -hope to avoid VP, NS following -On antibiotics for possible  pneumonia.   10/16/2011 8:06 AM  Lesly Dukes  Pager:  [phone]

## 2011-10-16 NOTE — Progress Notes (Signed)
Subjective: Patient continues on ventilator via tracheostomy. Patient had significant rise in temperature to 103.1 yesterday. Nursing staff reports that antibiotic therapy was adjusted by CCM. Overall patient's responsiveness has diminished. Patient recently was CT last night because of decreased responsiveness, and CT showed decreased ventricular size, no evidence of hydrocephalus.  Objective: Vital signs in last 24 hours: Filed Vitals:   10/16/11 0600 10/16/11 0700 10/16/11 0755 10/16/11 0800  BP: 145/84 132/74 132/74 132/79  Pulse: 80 78 90 89  Temp:   98.8 F (37.1 C)   TempSrc:   Oral   Resp:   16   Height:      Weight: 117.8 kg (259 lb 11.2 oz)     SpO2: 100% 95% 98% 97%    Intake/Output from previous day: 08/30 0701 - 08/31 0700 In: 2200 [I.V.:500; NG/GT:290; IV Piggyback:750] Out: 1625 [Urine:575; Drains:1050] Intake/Output this shift: Total I/O In: 80 [I.V.:20; Other:60] Out: -   Physical Exam:  Little in the way of response to voice or pain. Left pupil about 3 mm, right pupil about 2 mm, both sluggishly reactive to light.  CBC  Basename 10/16/11 0610 10/15/11 0620  WBC 17.5* 13.3*  HGB 9.6* 10.3*  HCT 30.4* 32.5*  PLT 398 421*   BMET  Basename 10/16/11 0610 10/15/11 0620  NA 142 144  K 3.4* 3.8  CL 106 106  CO2 30 28  GLUCOSE 121* 146*  BUN 27* 24*  CREATININE 0.79 0.78  CALCIUM 9.6 9.8    Studies/Results: Ct Head Wo Contrast  10/15/2011  **ADDENDUM** CREATED: 10/15/2011 23:12:31  Findings were discussed with Dr. Newell Coral as the tiny hyperdense focus just inside the right frontal skull likely represents a small bone fragment secondary to bone drilling.  **END ADDENDUM** SIGNED BY: Melton Alar. Micheline Maze, M.D.   10/15/2011  *RADIOLOGY REPORT*  Clinical Data: Change in mental status.  CT HEAD WITHOUT CONTRAST  Technique:  Contiguous axial images were obtained from the base of the skull through the vertex without contrast.  Comparison: 10/13/2011  Findings:  There  is mild motion artifact.  Exam demonstrates interval removal of a right frontal ventriculostomy catheter. There is minimal air within the right frontal horn and temporal horn.  There is minimal hyperdensity along the ventriculostomy tract in the right frontal lobe, unchanged.  There is no significant change in patient's right thalamic hemorrhage measuring 1.4 x 3 cm in AP and transverse dimension with mild adjacent edema and mass effect. There is no change in a tiny 3 mm hyperdense focus near the origin of the ventriculostomy tract in the right frontal lobe.  There is a 6 mm dense focus just inside the skull over the right frontal lobe where the ventriculostomy catheter has been removed as this that may represent small retained foreign body. Remainder of the exam is unchanged.  IMPRESSION: Stable right thalamic acute to subacute hemorrhage measuring 1.4 x 3 cm with mild adjacent edema and mass effect.  Interval removal of a right frontal ventriculostomy catheter with stable minimal hemorrhage along the ventriculostomy tract in the right frontal lobe.  The 6 mm dense focus along the tract just inside the frontal skull which may represent foreign body.   Original Report Authenticated By: Elba Barman, M.D.    Dg Chest Port 1 View  10/15/2011  *RADIOLOGY REPORT*  Clinical Data: Tracheostomy, atelectasis  PORTABLE CHEST - 1 VIEW  Comparison: 10/12/2011  Findings: Stable tracheostomy and left IJ central line.  Left IJ central line tip slightly retracted  in the left innominate vein. Heart is enlarged with vascular congestion, low lung volumes and basilar atelectasis.  No enlarging effusion or pneumothorax.  No significant interval change.  IMPRESSION: Stable portable chest exam   Original Report Authenticated By: Judie Petit. Ruel Favors, M.D.     Assessment/Plan: Legrand Rams continued supportive care, no indication for replacement of the IVC at this time.   Hewitt Shorts, MD 10/16/2011, 8:59 AM

## 2011-10-17 LAB — URINE CULTURE: Colony Count: 1000

## 2011-10-17 LAB — CBC
HCT: 31.1 % — ABNORMAL LOW (ref 39.0–52.0)
Hemoglobin: 9.9 g/dL — ABNORMAL LOW (ref 13.0–17.0)
MCH: 29.3 pg (ref 26.0–34.0)
MCHC: 31.8 g/dL (ref 30.0–36.0)
MCV: 92 fL (ref 78.0–100.0)
Platelets: 398 10*3/uL (ref 150–400)
RBC: 3.38 MIL/uL — ABNORMAL LOW (ref 4.22–5.81)
RDW: 13.3 % (ref 11.5–15.5)
WBC: 14.1 10*3/uL — ABNORMAL HIGH (ref 4.0–10.5)

## 2011-10-17 LAB — BASIC METABOLIC PANEL
BUN: 22 mg/dL (ref 6–23)
CO2: 30 mEq/L (ref 19–32)
Calcium: 9.6 mg/dL (ref 8.4–10.5)
Chloride: 101 mEq/L (ref 96–112)
Creatinine, Ser: 0.87 mg/dL (ref 0.50–1.35)
GFR calc Af Amer: >90 mL/min (ref >90)
GFR calc non Af Amer: >90 mL/min (ref >90)
Glucose, Bld: 117 mg/dL — ABNORMAL HIGH (ref 70–99)
Potassium: 3.4 mEq/L — ABNORMAL LOW (ref 3.5–5.1)
Sodium: 139 mEq/L (ref 135–145)

## 2011-10-17 LAB — CLOSTRIDIUM DIFFICILE BY PCR: Toxigenic C. Difficile by PCR: NEGATIVE

## 2011-10-17 LAB — GLUCOSE, CAPILLARY
Glucose-Capillary: 88 mg/dL (ref 70–99)
Glucose-Capillary: 94 mg/dL (ref 70–99)

## 2011-10-17 MED ORDER — POTASSIUM CHLORIDE 20 MEQ/15ML (10%) PO LIQD
40.0000 meq | Freq: Once | ORAL | Status: AC
  Administered 2011-10-17: 40 meq
  Filled 2011-10-17: qty 30

## 2011-10-17 MED ORDER — OXYCODONE HCL 5 MG/5ML PO SOLN
5.0000 mg | Freq: Four times a day (QID) | ORAL | Status: DC | PRN
  Administered 2011-10-17 – 2011-10-20 (×6): 5 mg via ORAL
  Filled 2011-10-17 (×7): qty 5

## 2011-10-17 MED ORDER — METOPROLOL TARTRATE 25 MG/10 ML ORAL SUSPENSION
25.0000 mg | Freq: Two times a day (BID) | ORAL | Status: DC
  Administered 2011-10-17 – 2011-10-22 (×11): 25 mg
  Filled 2011-10-17 (×16): qty 10

## 2011-10-17 NOTE — Progress Notes (Signed)
Stroke Team Progress Note  HISTORY [name] is a 46 y.o. male  who had been c/o of headaches for few days, with nausea and vomiting,  On 10/01/11 morning according to his wife he had visual disturbance and then he vomited followed by left sided weakness approximately 7 AM. Patient arrived to the ED. Head CT showed  Right BG, bleed with some area involved in thalamus. With minimal mass effect.   According to the wife he has stopped taking his BP medications several years ago and does not go the physician as suggested. Previously, he had significant neurological symptoms with significant elevated BP that could have been attributed to hyertensive encephalopathy.   He had right ventriculostomy, later replacement of tube due to blockage, was  taken out by patient 08/29, he remain clinically stable, s/p trachostomy.  SUBJECTIVE The patient is awake and he is following commands, he does not indicate that he is in any discomfort. Trach in place, lip talking oriented to name, age.  Filed Vitals:   10/17/11 0600 10/17/11 0700 10/17/11 0759 10/17/11 0800  BP: 159/91 159/91 159/91 163/91  Pulse: 88 73 86 90  Temp: 99.3 F (37.4 C)  99 F (37.2 C)   TempSrc:      Resp: 25 23 18 20   Height:      Weight:      SpO2: 96% 100% 100% 99%   CBG (last 3)   Basename 10/17/11 0504 10/16/11 2338  GLUCAP 94 88   Intake/Output from previous day: 08/31 0701 - 09/01 0700 In: 2980 [I.V.:440; NG/GT:460; IV Piggyback:700] Out: 1125 [Urine:1125]  IV Fluid Intake:      . sodium chloride 20 mL (10/15/11 1026)  . feeding supplement (JEVITY 1.2 CAL) 1,000 mL (10/15/11 2126)   Diet:    Tube feedings Activity: bedrest DVT Prophylaxis:  Lovenox 40 mg sq daily   Studies: Results for orders placed during the hospital encounter of 10/01/11 (from the past 24 hour(s))  CLOSTRIDIUM DIFFICILE BY PCR     Status: Normal   Collection Time   10/16/11  7:41 PM      Component Value Range   C difficile by pcr  NEGATIVE  NEGATIVE  GLUCOSE, CAPILLARY     Status: Normal   Collection Time   10/16/11 11:38 PM      Component Value Range   Glucose-Capillary 88  70 - 99 mg/dL  BASIC METABOLIC PANEL     Status: Abnormal   Collection Time   10/17/11  5:03 AM      Component Value Range   Sodium 139  135 - 145 mEq/L   Potassium 3.4 (*) 3.5 - 5.1 mEq/L   Chloride 101  96 - 112 mEq/L   CO2 30  19 - 32 mEq/L   Glucose, Bld 117 (*) 70 - 99 mg/dL   BUN 22  6 - 23 mg/dL   Creatinine, Ser 1.61  0.50 - 1.35 mg/dL   Calcium 9.6  8.4 - 09.6 mg/dL   GFR calc non Af Amer >90  >90 mL/min   GFR calc Af Amer >90  >90 mL/min  CBC     Status: Abnormal   Collection Time   10/17/11  5:03 AM      Component Value Range   WBC 14.1 (*) 4.0 - 10.5 K/uL   RBC 3.38 (*) 4.22 - 5.81 MIL/uL   Hemoglobin 9.9 (*) 13.0 - 17.0 g/dL   HCT 04.5 (*) 40.9 - 81.1 %   MCV  92.0  78.0 - 100.0 fL   MCH 29.3  26.0 - 34.0 pg   MCHC 31.8  30.0 - 36.0 g/dL   RDW 95.6  21.3 - 08.6 %   Platelets 398  150 - 400 K/uL  GLUCOSE, CAPILLARY     Status: Normal   Collection Time   10/17/11  5:04 AM      Component Value Range   Glucose-Capillary 94  70 - 99 mg/dL    CSF cx neg Resp Cx few staph aureus Blood Cx no growth Urine Cx neg  CT head  10/13/2011  1. Mild regression and overall expected evolution of right  thalamic hemorrhage. Stable mild edema and mass effect.  2. Right frontal approach EVD remains in place. Stable to  slightly increased blood products along the parenchymal course of  the shunt. IVH has resolved. Stable ventricle size and  configuration.  3. No new intracranial abnormality  8/30 CT head  IMPRESSION:  Stable right thalamic acute to subacute hemorrhage measuring 1.4 x  3 cm with mild adjacent edema and mass effect.  Interval removal of a right frontal ventriculostomy catheter with  stable minimal hemorrhage along the ventriculostomy tract in the  right frontal lobe. The 6 mm dense focus along the tract just  inside  the frontal skull which may represent foreign body.    Dg Chest Port 1 View 10/15/2011 Stable portable chest exam 10/12/2011 1. Interval placement of tracheostomy, appropriately positioned  without pneumothorax. 2. Low lung volumes. Patchy right greater than left airspace disease which is favored to represent atelectasis. 3. Cardiomegaly without congestive failure.  Neurological Exam : Mental Status: Alert.  Able to follow 3 step commands without difficulty.  Intubated.  Mitt on right hand, lip talking, oriented to name and age.. Cranial Nerves: II-blinks to confrontation less on left than right. III/IV/VI-mild right gaze preference.  Vertical gaze paralysis, skewed deviation.  Pupils reactive bilaterally.  Motor: flaccid LUE/LLE 0/5, 5/5 RUE/RLE will follow verbal commands Sensory: does not withdraw to noxious stim on left.  Plantars: upgoing on left, downgoing right Cerebellar: unable to assess  ASSESSMENT [name] is a 46 y.o. male with hypertensive right thalamic intracerebral hemorrhage with intraventricular extension with mild hydrocephalus and cytotoxic cerebral edema. Hemorrhage secondary to accelerated hypertension, BP on arrival 164/125.  Neurologically worsening led to IVC placement and intubation. INS had planned for VP shunt , but patient removed himself last night. Patient stable after removal. Remains awake and interactive, following commands.  Fevers to 102.9 LLL infiltrate, WBC up to 17K. Now on antibiotics.  -VDRF. S/p trach by Dr. Jenne Pane 8/26. On trach collar ST following. PM valve in use.. -History of medication noncompliance. -accelerated hypertension -dysphagia secondary to stroke. PEG placed 8/28 by IR.   -left sided clonus, baclofen added  CT head done shows actual improvement of ventricular size following extraction of the ventriculostomy  Hospital day # 16  TREATMENT/PLAN -CT for Monday, NS following, recommend watch in unit until Monday, and if  stable, ok to move out. -hope to avoid VP, NS following -On antibiotics for possible pneumonia.   10/17/2011 10:05 AM  Jeremy [name]  Pager:  [phone]

## 2011-10-17 NOTE — Progress Notes (Signed)
Subjective: Patient reports alert, responsive.  Objective: Vital signs in last 24 hours: Temp:  [98.9 F (37.2 C)-102.9 F (39.4 C)] 99 F (37.2 C) (09/01 0759) Pulse Rate:  [73-115] 90  (09/01 0800) Resp:  [18-30] 20  (09/01 0800) BP: (117-163)/(62-106) 163/91 mmHg (09/01 0800) SpO2:  [89 %-100 %] 99 % (09/01 0800) FiO2 (%):  [28 %] 28 % (09/01 0800)  Intake/Output from previous day: 08/31 0701 - 09/01 0700 In: 2980 [I.V.:440; NG/GT:460; IV Piggyback:700] Out: 1125 [Urine:1125] Intake/Output this shift: Total I/O In: 110 [I.V.:50; Other:60] Out: -   Physical Exam: Awake, alert, following commands.  Lab Results:  [facility] 10/17/11 0503 10/16/11 0610  WBC 14.1* 17.5*  HGB 9.9* 9.6*  HCT 31.1* 30.4*  PLT 398 398   BMET  Basename 10/17/11 0503 10/16/11 0610  NA 139 142  K 3.4* 3.4*  CL 101 106  CO2 30 30  GLUCOSE 117* 121*  BUN 22 27*  CREATININE 0.87 0.79  CALCIUM 9.6 9.6    Studies/Results: Ct Head Wo Contrast  10/15/2011  **ADDENDUM** CREATED: 10/15/2011 23:12:31  Findings were discussed with Dr. Newell Coral as the tiny hyperdense focus just inside the right frontal skull likely represents a small bone fragment secondary to bone drilling.  **END ADDENDUM** SIGNED BY: Melton Alar. Micheline Maze, M.D.   10/15/2011  *RADIOLOGY REPORT*  Clinical Data: Change in mental status.  CT HEAD WITHOUT CONTRAST  Technique:  Contiguous axial images were obtained from the base of the skull through the vertex without contrast.  Comparison: 10/13/2011  Findings:  There is mild motion artifact.  Exam demonstrates interval removal of a right frontal ventriculostomy catheter. There is minimal air within the right frontal horn and temporal horn.  There is minimal hyperdensity along the ventriculostomy tract in the right frontal lobe, unchanged.  There is no significant change in patient's right thalamic hemorrhage measuring 1.4 x 3 cm in AP and transverse dimension with mild adjacent edema and mass  effect. There is no change in a tiny 3 mm hyperdense focus near the origin of the ventriculostomy tract in the right frontal lobe.  There is a 6 mm dense focus just inside the skull over the right frontal lobe where the ventriculostomy catheter has been removed as this that may represent small retained foreign body. Remainder of the exam is unchanged.  IMPRESSION: Stable right thalamic acute to subacute hemorrhage measuring 1.4 x 3 cm with mild adjacent edema and mass effect.  Interval removal of a right frontal ventriculostomy catheter with stable minimal hemorrhage along the ventriculostomy tract in the right frontal lobe.  The 6 mm dense focus along the tract just inside the frontal skull which may represent foreign body.   Original Report Authenticated By: Elba Barman, M.D.     Assessment/Plan: Patient stable.  Head CT to be repeated in am.  Doubt he will need IVC, but continue to observe in ICU and reassess in am.    LOS: 16 days    Dominika Losey D, MD 10/17/2011, 10:34 AM

## 2011-10-17 NOTE — Progress Notes (Signed)
Name: Jeremy [name] MRN: [id number] DOB: 1965-12-07    LOS: 16  Referring Provider:  Neuro Reason for Referral:  CVA   PULMONARY / CRITICAL CARE MEDICINE  HPI:  46 yo male admitted 10/01/2011 with unsteady gait, and left hand weakness associated with headache and vomiting.  He was found to have Rt basal ganglia ICH.  Events Since Admission: 8/16 Admit with basal ganglia hemorrhage 8/20 Intubated for airway protection and elevated ICP with IVC drain placement 8/21 IVC drain replaced d/t prior drain malfunction 8/29 Pt pulled ventric drain 8/30 Abx started for Tm 103.1  Subjective:  Had fever again last night (Tm 102.9).  Denies chest pain.  Breathing okay.  Vital Signs: Temp:  [98.9 F (37.2 C)-102.9 F (39.4 C)] 99 F (37.2 C) (09/01 0759) Pulse Rate:  [61-115] 90  (09/01 0800) Resp:  [18-30] 20  (09/01 0800) BP: (117-163)/(62-106) 163/91 mmHg (09/01 0800) SpO2:  [89 %-100 %] 99 % (09/01 0800) FiO2 (%):  [28 %] 28 % (09/01 0800)  Intake/Output Summary (Last 24 hours) at 10/17/11 0853 Last data filed at 10/17/11 0800  Gross per 24 hour  Intake   2980 ml  Output   1125 ml  Net   1855 ml    Physical Examination: Gen: No distress HEENT: Trach site clean PULM: Decreased breath sounds, scattered rhonchi CV: s1s2 regular, no murmur AB: soft, non tender, +bowel sounds, PEG site clean Ext: no edema Derm: no rashes Neuro: More awake, follows commands  Ct Head Wo Contrast  10/15/2011  **ADDENDUM** CREATED: 10/15/2011 23:12:31  Findings were discussed with Dr. Newell Coral as the tiny hyperdense focus just inside the right frontal skull likely represents a small bone fragment secondary to bone drilling.  **END ADDENDUM** SIGNED BY: Melton Alar. Micheline Maze, M.D.   10/15/2011  *RADIOLOGY REPORT*  Clinical Data: Change in mental status.  CT HEAD WITHOUT CONTRAST  Technique:  Contiguous axial images were obtained from the base of the skull through the vertex without contrast.  Comparison:  10/13/2011  Findings:  There is mild motion artifact.  Exam demonstrates interval removal of a right frontal ventriculostomy catheter. There is minimal air within the right frontal horn and temporal horn.  There is minimal hyperdensity along the ventriculostomy tract in the right frontal lobe, unchanged.  There is no significant change in patient's right thalamic hemorrhage measuring 1.4 x 3 cm in AP and transverse dimension with mild adjacent edema and mass effect. There is no change in a tiny 3 mm hyperdense focus near the origin of the ventriculostomy tract in the right frontal lobe.  There is a 6 mm dense focus just inside the skull over the right frontal lobe where the ventriculostomy catheter has been removed as this that may represent small retained foreign body. Remainder of the exam is unchanged.  IMPRESSION: Stable right thalamic acute to subacute hemorrhage measuring 1.4 x 3 cm with mild adjacent edema and mass effect.  Interval removal of a right frontal ventriculostomy catheter with stable minimal hemorrhage along the ventriculostomy tract in the right frontal lobe.  The 6 mm dense focus along the tract just inside the frontal skull which may represent foreign body.   Original Report Authenticated By: Elba Barman, M.D.      ASSESSMENT AND PLAN  NEUROLOGIC  CNS DRAINS: IVC drain 8/20>>8/21 replaced >> 8/29 (pt pulled)  A: Acute Rt basal ganglia hemorrhage with hydrocephalus and elevated ICP s/p IVC>>pt pulled drain on 8/29. P:   Monitor with IVC drain out  Plan for f/u CT head next week if he remains stable PT/OT>>will need inpt rehab  PULMONARY  ETT:  8/20>>8/22>>reintubated 8/22>>8/26 Trach (Dr Jenne Pane) 8/26 >>   A: Acute respiratory failure due to mental status change and difficulty to protect airway.  S/p trach by ENT.  Has been off vent since 8/28. P:   Continue trach collar as tolerated Titrate oxygen to keep SpO2 > 92% Will need further eval with speech therapy at some  point when more stable F/u CXR intermittently  CARDIOVASCULAR  Lines: L IJ CVL 8/20>>>8/31 Lt PICC 8/31>>  8/16 TTE >> limited study, LVEF 60-70%  A: HTN. P:  Add scheduled lopressor 9/01  RENAL  Lab 10/17/11 0503 10/16/11 0610 10/15/11 0620 10/13/11 0430 10/12/11 0415  NA 139 142 144 140 143  K 3.4* 3.4* -- -- --  CL 101 106 106 107 108  CO2 30 30 28 26 29   BUN 22 27* 24* 23 27*  CREATININE 0.87 0.79 0.78 0.73 0.85  CALCIUM 9.6 9.6 9.8 9.8 9.8  MG -- -- 2.2 -- --  PHOS -- -- 3.7 -- --    Intake/Output Summary (Last 24 hours) at 10/17/11 0853 Last data filed at 10/17/11 0800  Gross per 24 hour  Intake   2980 ml  Output   1125 ml  Net   1855 ml    Foley:  8/16>>>out 8/21>>8/23>>>  A: Hyponatremia likely from cerebral salt wasting>>resolved. Hypokalemia. P:   F/u and replace electrolytes as needed  GASTROINTESTINAL  PEG 8/28>>  A:  Dysphagia. Nutrition. P:   Continue tube feeds via PEG.  HEMATOLOGIC  Lab 10/17/11 0503 10/16/11 0610 10/15/11 0620 10/13/11 0430 10/12/11 0415  HGB 9.9* 9.6* 10.3* 9.7* 10.2*  HCT 31.1* 30.4* 32.5* 31.0* 32.0*  PLT 398 398 421* 382 363  INR -- -- -- 1.22 --  APTT -- -- -- -- --   A:  Anemia. P:  F/u CBC  INFECTIOUS  Lab 10/17/11 0503 10/16/11 0610 10/15/11 0620 10/13/11 0430 10/12/11 0415  WBC 14.1* 17.5* 13.3* 15.3* 16.2*  PROCALCITON -- -- <0.10 -- --   Cultures: 8/16 UC>>neg 8/23 CSF>>> negative 8/23 BCx2>>>neg 8/23 Resp c/s>>> rare S aureus 8/30 Blood>> 8/30 Urine>> 8/30 C diff>>  Antibiotics: Zosyn 8/23 (?csf infx)>> 8/27 vanco 8/23 (? Csf )>> 8/26 Vancomycin 8/30>> Cefepime 8/30>>  A:  Persistent fever, ?source.  Lt IJ CVL d/c'ed 8/31. P:   Continue vancomycin, cefepime pending culture results  ENDOCRINE  Lab 10/17/11 0504 10/16/11 2338 10/13/11 1242 10/13/11 0754 10/13/11 0355  GLUCAP 94 88 91 99 91   A:  Hyperglycemia P:   -SSI   BEST PRACTICE / DISPOSITION Level of Care:   ICU Primary Service:  PCCM Consultants:  Neurology, ENT Jenne Pane, Neurosurgery (Pool) Code Status:  Full Diet:  TF DVT Px:  LMWH  GI Px:  ppi Skin Integrity:  intact Social / Family:   Coralyn Helling, MD [facility] Pulmonary/Critical Care 10/17/2011, 8:53 AM Pager:  [phone] After 3pm call: [phone]

## 2011-10-18 ENCOUNTER — Inpatient Hospital Stay (HOSPITAL_COMMUNITY): Payer: BC Managed Care – PPO

## 2011-10-18 LAB — VANCOMYCIN, TROUGH: Vancomycin Tr: 15.8 ug/mL (ref 10.0–20.0)

## 2011-10-18 LAB — GLUCOSE, CAPILLARY
Glucose-Capillary: 111 mg/dL — ABNORMAL HIGH (ref 70–99)
Glucose-Capillary: 143 mg/dL — ABNORMAL HIGH (ref 70–99)
Glucose-Capillary: 121 mg/dL — ABNORMAL HIGH (ref 70–99)
Glucose-Capillary: 102 mg/dL — ABNORMAL HIGH (ref 70–99)

## 2011-10-18 LAB — CBC
HCT: 28.7 % — ABNORMAL LOW (ref 39.0–52.0)
Hemoglobin: 9.2 g/dL — ABNORMAL LOW (ref 13.0–17.0)
MCH: 28.3 pg (ref 26.0–34.0)
MCHC: 32.1 g/dL (ref 30.0–36.0)
MCV: 88.3 fL (ref 78.0–100.0)
Platelets: 351 10*3/uL (ref 150–400)
RBC: 3.25 MIL/uL — ABNORMAL LOW (ref 4.22–5.81)
RDW: 12.5 % (ref 11.5–15.5)
WBC: 13.5 10*3/uL — ABNORMAL HIGH (ref 4.0–10.5)

## 2011-10-18 LAB — BASIC METABOLIC PANEL
BUN: 18 mg/dL (ref 6–23)
CO2: 28 mEq/L (ref 19–32)
Calcium: 9.2 mg/dL (ref 8.4–10.5)
Chloride: 101 mEq/L (ref 96–112)
Creatinine, Ser: 0.66 mg/dL (ref 0.50–1.35)
GFR calc Af Amer: >90 mL/min (ref >90)
GFR calc non Af Amer: >90 mL/min (ref >90)
Glucose, Bld: 123 mg/dL — ABNORMAL HIGH (ref 70–99)
Potassium: 3.8 mEq/L (ref 3.5–5.1)
Sodium: 137 mEq/L (ref 135–145)

## 2011-10-18 MED ORDER — WHITE PETROLATUM GEL
Status: AC
  Administered 2011-10-18: 18:00:00
  Filled 2011-10-18: qty 5

## 2011-10-18 NOTE — Progress Notes (Signed)
Name: Jeremy [name] MRN: [id number] DOB: January 20, 1966    LOS: 17  Referring Provider:  Neuro Reason for Referral:  CVA   PULMONARY / CRITICAL CARE MEDICINE  HPI:  46 yo male admitted 10/01/2011 with unsteady gait, and left hand weakness associated with headache and vomiting.  He was found to have Rt basal ganglia ICH.  Events Since Admission: 8/16 Admit with basal ganglia hemorrhage 8/20 Intubated for airway protection and elevated ICP with IVC drain placement 8/21 IVC drain replaced d/t prior drain malfunction 8/29 Pt pulled ventric drain 8/30 Abx started for Tm 103.1  Subjective:  No recurrent of fever.  Vital Signs: Temp:  [98.9 F (37.2 C)-100.6 F (38.1 C)] 99.6 F (37.6 C) (09/02 0500) Pulse Rate:  [68-136] 98  (09/02 0752) Resp:  [14-31] 31  (09/02 0752) BP: (130-173)/(78-103) 132/78 mmHg (09/02 0700) SpO2:  [94 %-100 %] 100 % (09/02 0752) FiO2 (%):  [28 %] 28 % (09/02 0752) Weight:  [266 lb 15.6 oz (121.1 kg)] 266 lb 15.6 oz (121.1 kg) (09/02 0500)  Intake/Output Summary (Last 24 hours) at 10/18/11 0807 Last data filed at 10/18/11 0700  Gross per 24 hour  Intake   2400 ml  Output    675 ml  Net   1725 ml    Physical Examination: Gen: No distress HEENT: Trach site clean PULM: Decreased breath sounds, scattered rhonchi CV: s1s2 regular, no murmur AB: soft, non tender, +bowel sounds, PEG site clean Ext: no edema Derm: no rashes Neuro: More awake, follows commands  Ct Head Wo Contrast  10/18/2011  *RADIOLOGY REPORT*  Clinical Data: Follow up thalamic hemorrhage  CT HEAD WITHOUT CONTRAST  Technique:  Contiguous axial images were obtained from the base of the skull through the vertex without contrast.  Comparison: 10/15/2011  Findings: Examination is limited by patient motion despite repeating images. Again identified large right thalamic hemorrhage measuring approximately 2.9 x 1.3 cm, previously 3.0 x 1.5 cm. Surrounding edema. Minimal right-to-left midline shift  similar to previous exam. Focus of edema identified in the right frontal lobe at site of prior intraventricular catheter. No definite areas of new hemorrhage or infarction identified. Ventricular system appears stable. No definite extra-axial hemorrhage seen. Visualized sinuses clear.  IMPRESSION: Little change in the size of the right thalamic hemorrhage with surrounding edema and minimal mass effect.   Original Report Authenticated By: Lollie Marrow, M.D.      ASSESSMENT AND PLAN  NEUROLOGIC  CNS DRAINS: IVC drain 8/20>>8/21 replaced >> 8/29 (pt pulled)  A: Acute Rt basal ganglia hemorrhage with hydrocephalus and elevated ICP s/p IVC>>pt pulled drain on 8/29.  CT head stable to improved. P:   Monitor with IVC drain out PT/OT>>will need inpt rehab  PULMONARY  ETT:  8/20>>8/22>>reintubated 8/22>>8/26 Trach (Dr Jenne Pane) 8/26 >>   A: Acute respiratory failure due to mental status change and difficulty to protect airway.  S/p trach by ENT.  Has been off vent since 8/28. P:   Continue trach collar as tolerated Titrate oxygen to keep SpO2 > 92% Will need further eval with speech therapy at some point when more stable F/u CXR intermittently>>9/03  CARDIOVASCULAR  Lines: L IJ CVL 8/20>>>8/31 Lt PICC 8/31>>  8/16 TTE >> limited study, LVEF 60-70%  A: HTN. P:  Continue scheduled lopressor  RENAL  Lab 10/18/11 0245 10/17/11 0503 10/16/11 0610 10/15/11 0620 10/13/11 0430  NA 137 139 142 144 140  K 3.8 3.4* -- -- --  CL 101 101 106 106 107  CO2 28 30 30 28 26   BUN 18 22 27* 24* 23  CREATININE 0.66 0.87 0.79 0.78 0.73  CALCIUM 9.2 9.6 9.6 9.8 9.8  MG -- -- -- 2.2 --  PHOS -- -- -- 3.7 --    Intake/Output Summary (Last 24 hours) at 10/18/11 1610 Last data filed at 10/18/11 0700  Gross per 24 hour  Intake   2400 ml  Output    675 ml  Net   1725 ml    Foley:  8/16>>>out 8/21>>8/23>>>  A: Hyponatremia likely from cerebral salt wasting>>resolved. Hypokalemia. P:   F/u and  replace electrolytes as needed  GASTROINTESTINAL  PEG 8/28>>  A:  Dysphagia. Nutrition. P:   Continue tube feeds via PEG.  HEMATOLOGIC  Lab 10/18/11 0245 10/17/11 0503 10/16/11 0610 10/15/11 0620 10/13/11 0430  HGB 9.2* 9.9* 9.6* 10.3* 9.7*  HCT 28.7* 31.1* 30.4* 32.5* 31.0*  PLT 351 398 398 421* 382  INR -- -- -- -- 1.22  APTT -- -- -- -- --   A:  Anemia. P:  F/u CBC intermittenlty  INFECTIOUS  Lab 10/18/11 0245 10/17/11 0503 10/16/11 0610 10/15/11 0620 10/13/11 0430  WBC 13.5* 14.1* 17.5* 13.3* 15.3*  PROCALCITON -- -- -- <0.10 --   Cultures: 8/16 UC>>neg 8/23 CSF>>> negative 8/23 BCx2>>>neg 8/23 Resp c/s>>> rare S aureus 8/30 Blood>> 8/30 Urine>>negative 8/30 C diff>>negative  Antibiotics: Zosyn 8/23 (?csf infx)>> 8/27 vanco 8/23 (? Csf )>> 8/26 Vancomycin 8/30>> Cefepime 8/30>>  A:  Persistent fever, ?source, but likely from line>>Lt IJ CVL d/c'ed 8/31. P:   Continue vancomycin, cefepime pending culture results>>likey can d/c Abx soon if CXR 9/03 okay  ENDOCRINE  Lab 10/18/11 0252 10/18/11 0023 10/17/11 0504 10/16/11 2338 10/13/11 1242  GLUCAP 111* 143* 94 88 91   A:  Hyperglycemia P:   -SSI   BEST PRACTICE / DISPOSITION Level of Care:  ICU Primary Service:  PCCM Consultants:  Neurology, ENT Jenne Pane, Neurosurgery (Pool) Code Status:  Full Diet:  TF DVT Px:  LMWH  GI Px:  ppi Skin Integrity:  intact Social / Family: no family at bedside  Okay to transfer to neuro floor bed if okay with neurosurgery.  Coralyn Helling, MD [facility] Pulmonary/Critical Care 10/18/2011, 8:07 AM Pager:  [phone] After 3pm call: [phone]

## 2011-10-18 NOTE — Progress Notes (Signed)
Report called, pending transfer to 4 [facility] Room 4N15.

## 2011-10-18 NOTE — Progress Notes (Signed)
Physical Therapy Treatment Patient Details Name: Gussie Towson MRN: [id number] DOB: 06/13/1965 Today's Date: 10/18/2011 Time: 1478-2956 PT Time Calculation (min): 25 min  PT Assessment / Plan / Recommendation Comments on Treatment Session  Pt continues to be limited due to overall fatigue and focused attention.  Pt able to increase sitting tolerance with continue need for +2 (A).  Pt easily distracted and needs constant cues for redirection.  Pt with noticeable nystagmus with right downward beating.    Follow Up Recommendations  Inpatient Rehab;Supervision/Assistance - 24 hour    Barriers to Discharge        Equipment Recommendations  Defer to next venue    Recommendations for Other Services Rehab consult  Frequency Min 4X/week   Plan Discharge plan remains appropriate    Precautions / Restrictions Precautions Precautions: Fall (multiple lines)   Pertinent Vitals/Pain Unable to rate    Mobility  Bed Mobility Bed Mobility: Supine to Sit;Sitting - Scoot to [place] of Bed;Sit to Supine Rolling Left: 1: +2 Total assist Rolling Left: Patient Percentage: 10% Supine to Sit: 1: +2 Total assist;HOB elevated Supine to Sit: Patient Percentage: 10% Sitting - Scoot to Edge of Bed: 1: +2 Total assist Sitting - Scoot to Edge of Bed: Patient Percentage: 0% Sit to Supine: 1: +2 Total assist;HOB flat Sit to Supine: Patient Percentage: 0% Details for Bed Mobility Assistance: v/c for sequencing and hand over hand to place hand on bed rail to (A) therapist. Pt requires visual cues Transfers Transfers: Not assessed Modified Rankin (Stroke Patients Only) Pre-Morbid Rankin Score: No symptoms Modified Rankin: Severe disability    Exercises     PT Diagnosis:    PT Problem List:   PT Treatment Interventions:     PT Goals Acute Rehab PT Goals PT Goal Formulation: With patient Time For Goal Achievement: 10/28/11 Potential to Achieve Goals: Fair Pt will go Supine/Side to Sit: with mod  assist PT Goal: Supine/Side to Sit - Progress: Progressing toward goal Pt will Sit at Edge of Bed: with min assist;3-5 min PT Goal: Sit at Edge Of Bed - Progress: Progressing toward goal Pt will go Sit to Supine/Side: with mod assist PT Goal: Sit to Supine/Side - Progress: Progressing toward goal  Visit Information  Last PT Received On: 10/18/11 Assistance Needed: +3 or more    Subjective Data      Cognition  Overall Cognitive Status: Impaired Arousal/Alertness: Awake/alert Orientation Level: Other (comment) (difficult to assess due to trach) Behavior During Session: [facility] for tasks performed Current Attention Level: Focused Following Commands: Follows one step commands inconsistently Safety/Judgement: Decreased awareness of safety precautions;Decreased safety judgement for tasks assessed;Impulsive;Decreased awareness of need for assistance    Balance  Static Sitting Balance Static Sitting - Balance Support: Right upper extremity supported;Feet supported Static Sitting - Level of Assistance: 1: +2 Total assist Static Sitting - Comment/# of Minutes: ~10 minutes while facilitating upright posture and prevent left posterior lean.  Max tactile cues for correct midline.  End of Session PT - End of Session Equipment Utilized During Treatment: Other (comment);Oxygen Activity Tolerance: Patient tolerated treatment well Patient left: in bed;with call bell/phone within reach;with nursing in room Nurse Communication: Mobility status   GP     Aniqa Hare 10/18/2011, 12:15 PM Jake Shark, PT DPT [phone]

## 2011-10-18 NOTE — Evaluation (Signed)
Occupational Therapy Evaluation Patient Details Name: Jeremy [name] MRN: [id number] DOB: June 08, 1965 Today's Date: 10/18/2011 Time: 7829-5621 OT Time Calculation (min): 25 min  OT Assessment / Plan / Recommendation Clinical Impression  46 yo male admitted due to incontinence vomitting Lt side weakness and HA. Ct revealed Rt thalamic ICH hydrocephalus cytotoxic cerebral edema and visual changes noted. Pt with complications during hosptialization now with trach and peg. Pt s/p IVC monitor which pt d/ced himself. OT to follow acutely. Recommend SNf for dc planning.     OT Assessment  Patient needs continued OT Services    Follow Up Recommendations  Skilled nursing facility    Barriers to Discharge      Equipment Recommendations  Defer to next venue    Recommendations for Other Services    Frequency  Min 2X/week    Precautions / Restrictions Precautions Precautions: Fall (multiple lines)   Pertinent Vitals/Pain Stable vitals    ADL  Eating/Feeding: NPO Grooming: Performed;Wash/dry face;Maximal assistance (terminating prematurely) Where Assessed - Grooming: Supported sitting Transfers/Ambulation Related to ADLs: not appropriate at this time ADL Comments: Pt supine on arrival. Pt log rolled to Lt side to provide weight bearing sensory input. Pt supine<>sit EOB total +2 (A) 10% reaching with Rt hand for therapist. Pt sitting EOB with strong Lt lean. Pt following simple commands only with visual cueing included.( two fingers, wave,high five, okay sign) Pt eye deviation to rt side with downward beating nystagmus.Pt with no active movement noted on Lt side. Pt with slight left shoulder subluxation less than 1 finger space.     OT Diagnosis: Cognitive deficits;Disturbance of vision;Generalized weakness;Hemiplegia non-dominant side;Altered mental status  OT Problem List: Decreased strength;Decreased activity tolerance;Decreased range of motion;Impaired balance (sitting and/or  standing);Impaired vision/perception;Obesity;Impaired UE functional use;Impaired sensation;Cardiopulmonary status limiting activity;Decreased knowledge of precautions;Decreased knowledge of use of DME or AE;Decreased safety awareness;Decreased cognition;Decreased coordination;Impaired tone OT Treatment Interventions: Therapeutic exercise;Self-care/ADL training;Neuromuscular education;DME and/or AE instruction;Therapeutic activities;Cognitive remediation/compensation;Visual/perceptual remediation/compensation;Patient/family education;Balance training   OT Goals Acute Rehab OT Goals OT Goal Formulation: Patient unable to participate in goal setting Time For Goal Achievement: 11/01/11 Potential to Achieve Goals: Fair ADL Goals Pt Will Perform Grooming: Supine, head of bed up;Supported;with cueing (comment type and amount);with mod assist ADL Goal: Grooming - Progress: Goal set today Miscellaneous OT Goals Miscellaneous OT Goal #1: PT will tolerate EOB sitting for ~15 minutes with min (A) as precursor to adls OT Goal: Miscellaneous Goal #1 - Progress: Goal set today Miscellaneous OT Goal #2: PT will complete bed mobility log roll to the left side Min (A) as precursor to ADLS OT Goal: Miscellaneous Goal #2 - Progress: Goal set today Miscellaneous OT Goal #3: Pt will follow simple commands consistently 80 % of session OT Goal: Miscellaneous Goal #3 - Progress: Goal set today  Visit Information  Last OT Received On: 10/18/11 Assistance Needed: +3 or more PT/OT Co-Evaluation/Treatment: Yes    Subjective Data  Subjective: nodding head "yes" - pt with trach and mouthing words but unable to verbalize due to currently without PMV Patient Stated Goal: unknown at this time.- previous evaluation pt wanted to return to watching tv at home and church   Prior Functioning  Vision/Perception  Home Living Lives With: Spouse Available Help at Discharge: Family;Available 24 hours/day Type of Home:  House Home Access: Stairs to enter [employer] of Steps: 2 Entrance Stairs-Rails: None Home Layout: One level Bathroom Shower/Tub: Forensic scientist: Standard Bathroom Accessibility: Yes How Accessible: Accessible via walker Home Adaptive Equipment: None  Additional Comments: enjoys watching tv (wrestling) , like to participate with church, mows grass at home, likes to play on computer Prior Function Level of Independence: Independent Able to Take Stairs?: Yes Driving: Yes 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 Communication Communication: No difficulties Dominant Hand: Right   Vision - Assessment Eye Alignment: Impaired (comment) Vision Assessment: Vision tested Ocular Range of Motion: Restricted on the left;Restricted looking up;Impaired-to be further tested in functional context (difficult to assess due to cognition) Tracking/Visual Pursuits: Decreased smoothness of eye movement to LEFT superior field;Decreased smoothness of eye movement to LEFT inferior field;Unable to hold eye position out of midline;Requires cues, head turns, or add eye shifts to track  Cognition  Overall Cognitive Status: Impaired Arousal/Alertness: Awake/alert Orientation Level: Other (comment) (difficult to assess due to trach) Behavior During Session: [facility] for tasks performed Current Attention Level: Focused Following Commands: Follows one step commands inconsistently Safety/Judgement: Decreased awareness of safety precautions;Decreased safety judgement for tasks assessed;Impulsive;Decreased awareness of need for assistance    Extremity/Trunk Assessment Right Upper Extremity Assessment RUE ROM/Strength/Tone: Within functional levels RUE Sensation: WFL - Light Touch RUE Coordination: WFL - gross/fine motor Left Upper Extremity Assessment LUE ROM/Strength/Tone: Deficits LUE Sensation: Deficits LUE Coordination: Deficits   Mobility  Shoulder Instructions   Bed Mobility Rolling Left: 1: +2 Total assist Rolling Left: Patient Percentage: 10% Supine to Sit: 1: +2 Total assist;HOB elevated Supine to Sit: Patient Percentage: 10% Sitting - Scoot to Edge of Bed: 1: +2 Total assist Sitting - Scoot to Edge of Bed: Patient Percentage: 0% Sit to Supine: 1: +2 Total assist;HOB flat Sit to Supine: Patient Percentage: 0% Details for Bed Mobility Assistance: v/c for sequencing and hand over hand to place hand on bed rail to (A) therapist. Pt requires visual cues       Exercise     Balance Static Sitting Balance Static Sitting - Balance Support: Right upper extremity supported;Feet supported Static Sitting - Level of Assistance: 1: +2 Total assist Static Sitting - Comment/# of Minutes: 10 minutes with vitals stable and monitored   End of Session OT - End of Session Activity Tolerance: Patient tolerated treatment well Patient left: in bed;with call bell/phone within reach Nurse Communication: Need for lift equipment  GO     Lucile Shutters 10/18/2011, 11:25 AM Pager: [phone]

## 2011-10-18 NOTE — Progress Notes (Signed)
Subjective: Patient reports nods to questions  Objective: Vital signs in last 24 hours: Temp:  [98.9 F (37.2 C)-100.6 F (38.1 C)] 99.6 F (37.6 C) (09/02 0500) Pulse Rate:  [68-136] 73  (09/02 0500) Resp:  [14-28] 23  (09/02 0500) BP: (130-173)/(80-103) 153/85 mmHg (09/02 0500) SpO2:  [94 %-100 %] 98 % (09/02 0500) FiO2 (%):  [28 %] 28 % (09/02 0410) Weight:  [121.1 kg (266 lb 15.6 oz)] 121.1 kg (266 lb 15.6 oz) (09/02 0500)  Intake/Output from previous day: 09/01 0701 - 09/02 0700 In: 2320 [I.V.:470; IV Piggyback:650] Out: 475 [Urine:475] Intake/Output this shift: Total I/O In: 1240 [I.V.:240; Other:600; IV Piggyback:400] Out: 475 [Urine:475]  Physical Exam: Reportedly draining from IVC site last night, but on exam today, there is no leakage on bandage or from wound.  Lab Results:  Basename 10/18/11 0245 10/17/11 0503  WBC 13.5* 14.1*  HGB 9.2* 9.9*  HCT 28.7* 31.1*  PLT 351 398   BMET  Basename 10/18/11 0245 10/17/11 0503  NA 137 139  K 3.8 3.4*  CL 101 101  CO2 28 30  GLUCOSE 123* 117*  BUN 18 22  CREATININE 0.66 0.87  CALCIUM 9.2 9.6    Studies/Results: Ct Head Wo Contrast  10/18/2011  *RADIOLOGY REPORT*  Clinical Data: Follow up thalamic hemorrhage  CT HEAD WITHOUT CONTRAST  Technique:  Contiguous axial images were obtained from the base of the skull through the vertex without contrast.  Comparison: 10/15/2011  Findings: Examination is limited by patient motion despite repeating images. Again identified large right thalamic hemorrhage measuring approximately 2.9 x 1.3 cm, previously 3.0 x 1.5 cm. Surrounding edema. Minimal right-to-left midline shift similar to previous exam. Focus of edema identified in the right frontal lobe at site of prior intraventricular catheter. No definite areas of new hemorrhage or infarction identified. Ventricular system appears stable. No definite extra-axial hemorrhage seen. Visualized sinuses clear.  IMPRESSION: Little change in  the size of the right thalamic hemorrhage with surrounding edema and minimal mass effect.   Original Report Authenticated By: Lollie Marrow, M.D.     Assessment/Plan: Head CT and patient stable.  Continue supportive care.  No need for IVC.    LOS: 17 days    Dorian Heckle, MD 10/18/2011, 6:47 AM

## 2011-10-18 NOTE — Progress Notes (Signed)
I contacted pt's wife by phone. She would like to try inpt rehab if pt felt appropriate. I await further progress with therapy and then would pursue insurance approval with [employer]. [phone]

## 2011-10-18 NOTE — Progress Notes (Signed)
Stroke Team Progress Note  HISTORY [name] is a 46 y.o. male  who had been c/o of headaches for few days, with nausea and vomiting,  On 10/01/11 morning according to his wife he had visual disturbance and then he vomited followed by left sided weakness approximately 7 AM. Patient arrived to the ED. Head CT showed  Right BG, bleed with some area involved in thalamus. With minimal mass effect.   According to the wife he has stopped taking his BP medications several years ago and does not go the physician as suggested. Previously, he had significant neurological symptoms with significant elevated BP that could have been attributed to hypertensive encephalopathy.   He had right ventriculostomy, later replacement of tube due to blockage, was  taken out by patient 08/29, he remain clinically stable, s/p trachostomy.  SUBJECTIVE No complaints. Awake, alert. No pain.  Filed Vitals:   10/18/11 0600 10/18/11 0700 10/18/11 0752 10/18/11 0800  BP: 165/90 132/78  147/85  Pulse: 74 86 98 95  Temp:   98.7 F (37.1 C)   TempSrc:   Oral   Resp: 23 28 31 30   Height:      Weight:      SpO2: 98% 99% 100% 99%   CBG (last 3)  Basename 10/18/11 0252 10/18/11 0023 10/17/11 0504  GLUCAP 111* 143* 94   Intake/Output from previous day: 09/01 0701 - 09/02 0700 In: 2480 [I.V.:510; IV Piggyback:650] Out: 675 [Urine:675]  IV Fluid Intake:     . sodium chloride 20 mL/hr at 10/18/11 0800  . feeding supplement (JEVITY 1.2 CAL) 1,000 mL (10/15/11 2126)   Diet:    Tube feedings Activity: as tolerated DVT Prophylaxis:  Lovenox 40 mg sq daily   Studies: Results for orders placed during the hospital encounter of 10/01/11 (from the past 24 hour(s))  GLUCOSE, CAPILLARY     Status: Abnormal   Collection Time   10/18/11 12:23 AM      Component Value Range   Glucose-Capillary 143 (*) 70 - 99 mg/dL  BASIC METABOLIC PANEL     Status: Abnormal   Collection Time   10/18/11  2:45 AM      Component Value  Range   Sodium 137  135 - 145 mEq/L   Potassium 3.8  3.5 - 5.1 mEq/L   Chloride 101  96 - 112 mEq/L   CO2 28  19 - 32 mEq/L   Glucose, Bld 123 (*) 70 - 99 mg/dL   BUN 18  6 - 23 mg/dL   Creatinine, Ser 3.08  0.50 - 1.35 mg/dL   Calcium 9.2  8.4 - 65.7 mg/dL   GFR calc non Af Amer >90  >90 mL/min   GFR calc Af Amer >90  >90 mL/min  CBC     Status: Abnormal   Collection Time   10/18/11  2:45 AM      Component Value Range   WBC 13.5 (*) 4.0 - 10.5 K/uL   RBC 3.25 (*) 4.22 - 5.81 MIL/uL   Hemoglobin 9.2 (*) 13.0 - 17.0 g/dL   HCT 84.6 (*) 96.2 - 95.2 %   MCV 88.3  78.0 - 100.0 fL   MCH 28.3  26.0 - 34.0 pg   MCHC 32.1  30.0 - 36.0 g/dL   RDW 84.1  32.4 - 40.1 %   Platelets 351  150 - 400 K/uL  GLUCOSE, CAPILLARY     Status: Abnormal   Collection Time   10/18/11  2:52 AM  Component Value Range   Glucose-Capillary 111 (*) 70 - 99 mg/dL    CSF cx neg Resp Cx few staph aureus Blood Cx no growth Urine Cx neg  CT head   10/18/2011 Little change in the size of the right thalamic hemorrhage with surrounding edema and minimal mass effect. 10/15/2011 Stable right thalamic acute to subacute hemorrhage measuring 1.4 x  3 cm with mild adjacent edema and mass effect. Interval removal of a right frontal ventriculostomy catheter with stable minimal hemorrhage along the ventriculostomy tract in the right frontal lobe. The 6 mm dense focus along the tract just inside the frontal skull which may represent foreign body. 10/13/2011 Mild regression and overall expected evolution of right  thalamic hemorrhage. Stable mild edema and mass effect.  Right frontal approach EVD remains in place. Stable to slightly increased blood products along the parenchymal course of the shunt. IVH has resolved. Stable ventricle size and configuration. No new intracranial abnormality  Dg Chest Port 1 View 10/15/2011 Stable portable chest exam 10/12/2011 1. Interval placement of tracheostomy, appropriately positioned without  pneumothorax. 2. Low lung volumes. Patchy right greater than left airspace disease which is favored to represent atelectasis. 3. Cardiomegaly without congestive failure.  Neurological Exam : GENERAL EXAM: Patient is in no distress  CARDIOVASCULAR: Regular rate and rhythm, no murmurs, no carotid bruits  NEUROLOGIC: MENTAL STATUS: awake, alert, language fluent, comprehension intact CRANIAL NERVE: pupils equal and reactive to light, visual fields full to confrontation, UNABLE TO LOOK UP. CONVERGENCE NYSTAGMUS ON ATTEMPTED UPGAZE. DECR SENS ON LEFT FACE. DECR LEFT SHOULDER SHRUG. MOTOR: RUE, RLE FULL STRENGTH. LUE 0/5. LLE 1-/5. SENSORY: DECR ON LUE AND LLE. REFLEXES: LEFT TOE UPGOING.  ASSESSMENT [name] is a 46 y.o. male with hypertensive right thalamic intracerebral hemorrhage with intraventricular extension with mild hydrocephalus and cytotoxic cerebral edema. Hemorrhage secondary to accelerated hypertension, BP on arrival 164/125.  Neurologically worsening led to IVC placement and intubation. NS had planned for VP shunt , but patient removed himself. Patient stable after removal and remains stable. No indication for shunt per NS. Remains awake and interactive, following commands. CT stable.  -Fevers to 102.9 yesterday w/ LLL infiltrate, WBC up to 17K. Today low grade temp max 100.6  Back on Cefepime and Vancomycin 8/30. CCM plans to d/c if CXR ok 8/3.  -VDRF. S/p trach by Dr. Jenne Pane 8/26. On trach collar since 8/28. ST following. PM valve in use. Purulent drainage per MD exam today.  -History of medication noncompliance. -accelerated hypertension -dysphagia secondary to stroke. PEG placed 8/28 by IR.   -left sided clonus, baclofen added  Hospital day # 17  TREATMENT/PLAN -ok from neuro standpoint for transfer to the floor -no need for shunt per NS -CCM following antibiotics for possible PNA -rehab following for possible admission  Annie Main, MSN, RN, ANVP-BC, ANP-BC,  GNP-BC [facility] Pager: [phone] 10/18/2011 8:52 AM  Scribe for Dr. Joycelyn Schmid who has personally reviewed chart, pertinent data, examined the patient and developed the plan of care.  [facility] - Stroke Team Joycelyn Schmid, MD 10/18/2011, 12:35 PM   Please refer to [email or web address] for on-call Stroke MD

## 2011-10-18 NOTE — Progress Notes (Signed)
Called by  Nurse Marylene Land that patient's cranial wound (site of IVC) was leaking again.  It had done so last night, was dry this am on rounds, then leaked again at 6 pm.  I placed staples in site under sterile conditions.  A dressing was applied.  I instructed for HOB to be 30 degrees or greater.

## 2011-10-18 NOTE — Progress Notes (Addendum)
ANTIBIOTIC CONSULT NOTE - FOLLOW UP  Pharmacy Consult for vancomycin/cefepime Indication: rule out pneumonia  No Known Allergies  Patient Measurements: Height: 5\' 10"  (177.8 cm) Weight: 266 lb 15.6 oz (121.1 kg) IBW/kg (Calculated) : 73   Vital Signs: Temp: 98.9 F (37.2 C) (09/02 1802) Temp src: Oral (09/02 1200) BP: 129/87 mmHg (09/02 1802) Pulse Rate: 83  (09/02 1802) Intake/Output from previous day: 09/01 0701 - 09/02 0700 In: 2480 [I.V.:510; IV Piggyback:650] Out: 675 [Urine:675] Intake/Output from this shift:    Labs:  Basename 10/18/11 0245 10/17/11 0503 10/16/11 0610  WBC 13.5* 14.1* 17.5*  HGB 9.2* 9.9* 9.6*  PLT 351 398 398  LABCREA -- -- --  CREATININE 0.66 0.87 0.79   Estimated Creatinine Clearance: 152.1 ml/min (by C-G formula based on Cr of 0.66).  Basename 10/18/11 1850  VANCOTROUGH 15.8  VANCOPEAK --  VANCORANDOM --  GENTTROUGH --  GENTPEAK --  GENTRANDOM --  TOBRATROUGH --  TOBRAPEAK --  TOBRARND --  AMIKACINPEAK --  AMIKACINTROU --  AMIKACIN --     Microbiology: Recent Results (from the past 720 hour(s))  URINE CULTURE     Status: Normal   Collection Time   10/01/11  8:52 AM      Component Value Range Status Comment   Specimen Description URINE, CATHETERIZED   Final    Special Requests NONE   Final    Culture  Setup Time 10/01/2011 14:57   Final    Colony Count NO GROWTH   Final    Culture NO GROWTH   Final    Report Status 10/02/2011 FINAL   Final   MRSA PCR SCREENING     Status: Normal   Collection Time   10/01/11 10:11 AM      Component Value Range Status Comment   MRSA by PCR NEGATIVE  NEGATIVE Final   CSF CULTURE     Status: Normal   Collection Time   10/08/11  7:42 AM      Component Value Range Status Comment   Specimen Description CSF   Final    Special Requests 0.5ML   Final    Gram Stain     Final    Value: CYTOSPIN WBC PRESENT, PREDOMINANTLY PMN     NO ORGANISMS SEEN     Performed at [facility]   Culture  NO GROWTH 3 DAYS   Final    Report Status 10/11/2011 FINAL   Final   GRAM STAIN     Status: Normal   Collection Time   10/08/11  7:42 AM      Component Value Range Status Comment   Specimen Description CSF   Final    Special Requests 0.5ML   Final    Gram Stain     Final    Value: CYTOSPIN SLIDE     WBC PRESENT, PREDOMINANTLY PMN     NO ORGANISMS SEEN   Report Status 10/08/2011 FINAL   Final   CULTURE, RESPIRATORY     Status: Normal   Collection Time   10/08/11  8:18 AM      Component Value Range Status Comment   Specimen Description TRACHEAL ASPIRATE   Final    Special Requests NONE   Final    Gram Stain     Final    Value: FEW WBC PRESENT, PREDOMINANTLY PMN     NO SQUAMOUS EPITHELIAL CELLS SEEN     RARE GRAM POSITIVE COCCI     IN PAIRS IN CLUSTERS  Culture     Final    Value: FEW STAPHYLOCOCCUS AUREUS     Note: RIFAMPIN AND GENTAMICIN SHOULD NOT BE USED AS SINGLE DRUGS FOR TREATMENT OF STAPH INFECTIONS.   Report Status 10/10/2011 FINAL   Final    Organism ID, Bacteria STAPHYLOCOCCUS AUREUS   Final   URINE CULTURE     Status: Normal   Collection Time   10/08/11  9:22 AM      Component Value Range Status Comment   Specimen Description URINE, CATHETERIZED   Final    Special Requests NONE   Final    Culture  Setup Time 10/08/2011 15:18   Final    Colony Count NO GROWTH   Final    Culture NO GROWTH   Final    Report Status 10/08/2011 FINAL   Final   CULTURE, BLOOD (ROUTINE X 2)     Status: Normal   Collection Time   10/08/11 10:50 AM      Component Value Range Status Comment   Specimen Description BLOOD RIGHT ANTECUBITAL   Final    Special Requests BOTTLES DRAWN AEROBIC ONLY 5CC   Final    Culture  Setup Time 10/08/2011 14:23   Final    Culture NO GROWTH 5 DAYS   Final    Report Status 10/14/2011 FINAL   Final   CULTURE, BLOOD (ROUTINE X 2)     Status: Normal   Collection Time   10/08/11 11:00 AM      Component Value Range Status Comment   Specimen Description BLOOD LEFT  ANTECUBITAL   Final    Special Requests BOTTLES DRAWN AEROBIC ONLY 5CC   Final    Culture  Setup Time 10/08/2011 14:23   Final    Culture NO GROWTH 5 DAYS   Final    Report Status 10/14/2011 FINAL   Final   CULTURE, BLOOD (ROUTINE X 2)     Status: Normal (Preliminary result)   Collection Time   10/15/11  5:50 PM      Component Value Range Status Comment   Specimen Description BLOOD RIGHT ARM   Final    Special Requests BOTTLES DRAWN AEROBIC AND ANAEROBIC 10CC   Final    Culture  Setup Time 10/16/2011 04:26   Final    Culture     Final    Value:        BLOOD CULTURE RECEIVED NO GROWTH TO DATE CULTURE WILL BE HELD FOR 5 DAYS BEFORE ISSUING A FINAL NEGATIVE REPORT   Report Status PENDING   Incomplete   CULTURE, BLOOD (ROUTINE X 2)     Status: Normal (Preliminary result)   Collection Time   10/15/11  6:03 PM      Component Value Range Status Comment   Specimen Description BLOOD LEFT ARM   Final    Special Requests BOTTLES DRAWN AEROBIC AND ANAEROBIC 10CC   Final    Culture  Setup Time 10/16/2011 04:25   Final    Culture     Final    Value:        BLOOD CULTURE RECEIVED NO GROWTH TO DATE CULTURE WILL BE HELD FOR 5 DAYS BEFORE ISSUING A FINAL NEGATIVE REPORT   Report Status PENDING   Incomplete   URINE CULTURE     Status: Normal   Collection Time   10/16/11 12:10 AM      Component Value Range Status Comment   Specimen Description URINE, CLEAN CATCH   Final    Special  Requests NONE   Final    Culture  Setup Time 10/16/2011 11:22   Final    Colony Count 1,000 COLONIES/ML   Final    Culture INSIGNIFICANT GROWTH   Final    Report Status 10/17/2011 FINAL   Final   CLOSTRIDIUM DIFFICILE BY PCR     Status: Normal   Collection Time   10/16/11  7:41 PM      Component Value Range Status Comment   C difficile by pcr NEGATIVE  NEGATIVE Final     Anti-infectives     Start     Dose/Rate Route Frequency Ordered Stop   10/15/11 2200   ceFEPIme (MAXIPIME) 2 g in dextrose 5 % 50 mL IVPB        2  g 100 mL/hr over 30 Minutes Intravenous Every 12 hours 10/15/11 1803     10/15/11 2000   vancomycin (VANCOCIN) IVPB 1000 mg/200 mL premix        1,000 mg 200 mL/hr over 60 Minutes Intravenous Every 8 hours 10/15/11 1803     10/13/11 0800   ceFAZolin (ANCEF) IVPB 2 g/50 mL premix  Status:  Discontinued        2 g 100 mL/hr over 30 Minutes Intravenous  Once 10/12/11 1240 10/12/11 1300   10/13/11 0800   ceFAZolin (ANCEF) 3 g in dextrose 5 % 50 mL IVPB        3 g 160 mL/hr over 30 Minutes Intravenous On call 10/12/11 1301 10/13/11 1810   10/10/11 1800   vancomycin (VANCOCIN) IVPB 1000 mg/200 mL premix  Status:  Discontinued        1,000 mg 200 mL/hr over 60 Minutes Intravenous Every 8 hours 10/10/11 1320 10/11/11 0921   10/08/11 1100   piperacillin-tazobactam (ZOSYN) IVPB 3.375 g  Status:  Discontinued        3.375 g 12.5 mL/hr over 240 Minutes Intravenous Every 8 hours 10/08/11 1009 10/12/11 0942   10/08/11 1100   vancomycin (VANCOCIN) 1,250 mg in sodium chloride 0.9 % 250 mL IVPB  Status:  Discontinued        1,250 mg 166.7 mL/hr over 90 Minutes Intravenous Every 12 hours 10/08/11 1009 10/10/11 1320          Assessment: 46 yo M known to pharmacy from previous antibiotic dosing. Pharmacy asked to resume vancomycin dosing and initiate cefepime. Today is day 4 of vanc/cefepime therapy. Previously, vancomycin dose of 1250 mg IV q12h lead to trough of 8.4. Will obtain steady state vanc trough today to assess increased dose and since patient's urine output has decreased slightly. WBC today are 13.5 (down slightly from yesterday). Had temp to 100.3 yesterday.   Goal of Therapy:  Vancomycin trough level 15-20 mcg/ml  Plan:  Obtain vanc trough at 1930 today Continue cefepime 2 g every 12 hours F/u cultures  Lillia Pauls, PharmD Clinical Pharmacist Pager: [phone] Phone: [phone] 10/18/2011 7:35 PM  Addendum: Vancomycin trough drawn ~41minutes early, resulted in level of 15.8.  Drawn slightly early this is well within goal range for this patient. Will continue current dosing.  Plan: Continue Vancomycin 1000mg  IV q 8 hours - recheck vancomycin trough as indicated. Sheppard Coil PharmD.

## 2011-10-19 ENCOUNTER — Inpatient Hospital Stay (HOSPITAL_COMMUNITY): Payer: BC Managed Care – PPO

## 2011-10-19 LAB — GLUCOSE, CAPILLARY
Glucose-Capillary: 142 mg/dL — ABNORMAL HIGH (ref 70–99)
Glucose-Capillary: 142 mg/dL — ABNORMAL HIGH (ref 70–99)
Glucose-Capillary: 123 mg/dL — ABNORMAL HIGH (ref 70–99)
Glucose-Capillary: 107 mg/dL — ABNORMAL HIGH (ref 70–99)
Glucose-Capillary: 92 mg/dL (ref 70–99)

## 2011-10-19 LAB — BASIC METABOLIC PANEL
BUN: 18 mg/dL (ref 6–23)
CO2: 27 mEq/L (ref 19–32)
Calcium: 9.8 mg/dL (ref 8.4–10.5)
Chloride: 101 mEq/L (ref 96–112)
Creatinine, Ser: 0.7 mg/dL (ref 0.50–1.35)
GFR calc Af Amer: >90 mL/min (ref >90)
GFR calc non Af Amer: >90 mL/min (ref >90)
Glucose, Bld: 135 mg/dL — ABNORMAL HIGH (ref 70–99)
Potassium: 4.3 mEq/L (ref 3.5–5.1)
Sodium: 136 mEq/L (ref 135–145)

## 2011-10-19 LAB — CBC
HCT: 29.4 % — ABNORMAL LOW (ref 39.0–52.0)
Hemoglobin: 9.6 g/dL — ABNORMAL LOW (ref 13.0–17.0)
MCH: 28.8 pg (ref 26.0–34.0)
MCHC: 32.7 g/dL (ref 30.0–36.0)
MCV: 88.3 fL (ref 78.0–100.0)
Platelets: 368 10*3/uL (ref 150–400)
RBC: 3.33 MIL/uL — ABNORMAL LOW (ref 4.22–5.81)
RDW: 12.7 % (ref 11.5–15.5)
WBC: 12 10*3/uL — ABNORMAL HIGH (ref 4.0–10.5)

## 2011-10-19 NOTE — Progress Notes (Signed)
Stroke Team Progress Note  HISTORY [name] is a 46 y.o. male  who had been c/o of headaches for few days, with nausea and vomiting,  On 10/01/11 morning according to his wife he had visual disturbance and then he vomited followed by left sided weakness approximately 7 AM. Patient arrived to the ED. Head CT showed  Right BG, bleed with some area involved in thalamus. With minimal mass effect.   According to the wife he has stopped taking his BP medications several years ago and does not go the physician as suggested. Previously, he had significant neurological symptoms with significant elevated BP that could have been attributed to hypertensive encephalopathy.   He had right ventriculostomy, later replacement of tube due to blockage, was  taken out by patient 08/29, he remain clinically stable, s/p trachostomy.  SUBJECTIVE  Remains neurologically stable. Repeat CT head y`day stable without hydrocephalous. Filed Vitals:   10/19/11 0403 10/19/11 0523 10/19/11 0922 10/19/11 1009  BP:  151/95  148/93  Pulse: 70 70 78 105  Temp:  99.1 F (37.3 C)  98.8 F (37.1 C)  TempSrc:  Oral  Oral  Resp: 20 20 18 20   Height:      Weight:      SpO2: 98% 98% 100% 100%   CBG (last 3)   Basename 10/19/11 0417 10/19/11 10/18/11 2007  GLUCAP 142* 142* 102*   Intake/Output from previous day: 09/02 0701 - 09/03 0700 In: 886.7 [I.V.:106.7; IV Piggyback:250] Out: 1655 [Urine:1655]  IV Fluid Intake:     . sodium chloride 1,000 mL (10/18/11 1025)  . feeding supplement (JEVITY 1.2 CAL) 1,000 mL (10/18/11 1221)   Diet:    Tube feedings Activity: as tolerated DVT Prophylaxis:  Lovenox 40 mg sq daily   Studies: Results for orders placed during the hospital encounter of 10/01/11 (from the past 24 hour(s))  GLUCOSE, CAPILLARY     Status: Abnormal   Collection Time   10/18/11  4:43 PM      Component Value Range   Glucose-Capillary 121 (*) 70 - 99 mg/dL  VANCOMYCIN, TROUGH     Status: Normal   Collection Time   10/18/11  6:50 PM      Component Value Range   Vancomycin Tr 15.8  10.0 - 20.0 ug/mL  GLUCOSE, CAPILLARY     Status: Abnormal   Collection Time   10/18/11  8:07 PM      Component Value Range   Glucose-Capillary 102 (*) 70 - 99 mg/dL  GLUCOSE, CAPILLARY     Status: Abnormal   Collection Time   10/19/11 12:00 AM      Component Value Range   Glucose-Capillary 142 (*) 70 - 99 mg/dL   Comment 1 Documented in Chart     Comment 2 Notify RN    GLUCOSE, CAPILLARY     Status: Abnormal   Collection Time   10/19/11  4:17 AM      Component Value Range   Glucose-Capillary 142 (*) 70 - 99 mg/dL   Comment 1 Documented in Chart     Comment 2 Notify RN    BASIC METABOLIC PANEL     Status: Abnormal   Collection Time   10/19/11  8:15 AM      Component Value Range   Sodium 136  135 - 145 mEq/L   Potassium 4.3  3.5 - 5.1 mEq/L   Chloride 101  96 - 112 mEq/L   CO2 27  19 - 32 mEq/L  Glucose, Bld 135 (*) 70 - 99 mg/dL   BUN 18  6 - 23 mg/dL   Creatinine, Ser 1.61  0.50 - 1.35 mg/dL   Calcium 9.8  8.4 - 09.6 mg/dL   GFR calc non Af Amer >90  >90 mL/min   GFR calc Af Amer >90  >90 mL/min  CBC     Status: Abnormal   Collection Time   10/19/11  8:15 AM      Component Value Range   WBC 12.0 (*) 4.0 - 10.5 K/uL   RBC 3.33 (*) 4.22 - 5.81 MIL/uL   Hemoglobin 9.6 (*) 13.0 - 17.0 g/dL   HCT 04.5 (*) 40.9 - 81.1 %   MCV 88.3  78.0 - 100.0 fL   MCH 28.8  26.0 - 34.0 pg   MCHC 32.7  30.0 - 36.0 g/dL   RDW 91.4  78.2 - 95.6 %   Platelets 368  150 - 400 K/uL    CSF cx neg Resp Cx few staph aureus Blood Cx no growth Urine Cx neg  CT head   10/18/2011 Little change in the size of the right thalamic hemorrhage with surrounding edema and minimal mass effect. 10/15/2011 Stable right thalamic acute to subacute hemorrhage measuring 1.4 x  3 cm with mild adjacent edema and mass effect. Interval removal of a right frontal ventriculostomy catheter with stable minimal hemorrhage along the  ventriculostomy tract in the right frontal lobe. The 6 mm dense focus along the tract just inside the frontal skull which may represent foreign body. 10/13/2011 Mild regression and overall expected evolution of right  thalamic hemorrhage. Stable mild edema and mass effect.  Right frontal approach EVD remains in place. Stable to slightly increased blood products along the parenchymal course of the shunt. IVH has resolved. Stable ventricle size and configuration. No new intracranial abnormality  Dg Chest Port 1 View 10/15/2011 Stable portable chest exam 10/12/2011 1. Interval placement of tracheostomy, appropriately positioned without pneumothorax. 2. Low lung volumes. Patchy right greater than left airspace disease which is favored to represent atelectasis. 3. Cardiomegaly without congestive failure.  Neurological Exam : GENERAL EXAM: Patient is in no distress CARDIOVASCULAR: Regular rate and rhythm, no murmurs, no carotid bruits NEUROLOGIC: MENTAL STATUS: awake, alert, language fluent, comprehension intact CRANIAL NERVE: pupils equal and reactive to light, visual fields full to confrontation, UNABLE TO LOOK UP. CONVERGENCE NYSTAGMUS ON ATTEMPTED UPGAZE. DECR SENS ON LEFT FACE. DECR LEFT SHOULDER SHRUG. MOTOR: RUE, RLE FULL STRENGTH. LUE 0/5. LLE 1-/5. SENSORY: DECR ON LUE AND LLE. REFLEXES: LEFT TOE UPGOING.  ASSESSMENT [name] is a 46 y.o. male with hypertensive right thalamic intracerebral hemorrhage with intraventricular extension with mild hydrocephalus and cytotoxic cerebral edema. Hemorrhage secondary to accelerated hypertension, BP on arrival 164/125.  Neurologically worsening led to IVC placement and intubation. NS had planned for VP shunt , but patient removed himself. Patient stable after removal and remains stable. No indication for shunt per NS. There was some leaking from site; NS added staple last night. Remains awake and interactive, following commands. Wife prefers IP  rehab; they are following. Pt remains stable  -Fevers resolved. LLL infiltrate on previous CXR. WBC up to 17K, now 12.0. Remains on Cefepime and Vancomycin 8/30. CCM plans to d/c if CXR ok 8/3.  -VDRF. S/p trach by Dr. Jenne Pane 8/26. On trach collar since 8/28. ST following. PM valve in use.   -History of medication noncompliance. -accelerated hypertension -dysphagia secondary to stroke. PEG placed 8/28 by IR.   -  left sided clonus, baclofen added  Hospital day # 18  TREATMENT/PLAN -CCM following antibiotics for possible PNA -rehab following for possible admission; this is wife's preference.  Annie Main, MSN, RN, ANVP-BC, ANP-BC, GNP-BC [facility] Pager: [phone] 10/19/2011 1:57 PM  Scribe for Dr. Delia Heady, Stroke Center Medical Director, who has personally reviewed chart, pertinent data, examined the patient and developed the plan of care. Pager:  [phone]

## 2011-10-19 NOTE — Progress Notes (Signed)
Physical Therapy Treatment Patient Details Name: Jeremy [name] MRN: [id number] DOB: 30-Mar-1965 Today's Date: 10/19/2011 Time: 1026-1050 PT Time Calculation (min): 24 min  PT Assessment / Plan / Recommendation Comments on Treatment Session  Patient progressing with sitting balance EOB. Patient very motivated. Needing cues for redirection but progressing well.     Follow Up Recommendations  Inpatient Rehab;Supervision/Assistance - 24 hour    Barriers to Discharge        Equipment Recommendations  Defer to next venue    Recommendations for Other Services    Frequency Min 4X/week   Plan Discharge plan remains appropriate;Frequency remains appropriate    Precautions / Restrictions Precautions Precautions: Fall Precaution Comments: Patient with multiple lines   Pertinent Vitals/Pain     Mobility  Bed Mobility Rolling Right: 2: Max assist Rolling Left: 1: +1 Total assist Supine to Sit: 1: +2 Total assist;HOB elevated Supine to Sit: Patient Percentage: 20% Sitting - Scoot to Edge of Bed: 2: Max assist Sit to Supine: 1: +2 Total assist Sit to Supine: Patient Percentage: 0% Details for Bed Mobility Assistance: Patient needing hand over hand cueing and A with all aspects of transfer. Patient working on using R side to assist as much as possible.  Transfers Transfers: Not assessed    Exercises     PT Diagnosis:    PT Problem List:   PT Treatment Interventions:     PT Goals Acute Rehab PT Goals PT Goal: Supine/Side to Sit - Progress: Progressing toward goal PT Goal: Sit at Edge Of Bed - Progress: Progressing toward goal  Visit Information  Last PT Received On: 10/19/11 Assistance Needed: +3 or more    Subjective Data      Cognition  Overall Cognitive Status: Impaired Arousal/Alertness: Awake/alert Orientation Level: Appears intact for tasks assessed Behavior During Session: [facility] for tasks performed Current Attention Level: Focused Following Commands: Follows one  step commands inconsistently Safety/Judgement: Decreased awareness of safety precautions;Decreased safety judgement for tasks assessed    Balance  Static Sitting Balance Static Sitting - Balance Support: Right upper extremity supported;Feet supported Static Sitting - Level of Assistance: 1: +2 Total assist Static Sitting - Comment/# of Minutes: Patient sat EOB x 14 mins working on facilitating upright posture and midline. Patient with heavy left lean  End of Session PT - End of Session Equipment Utilized During Treatment: Oxygen Activity Tolerance: Patient tolerated treatment well Patient left: in bed;with call bell/phone within reach   GP     Robinette, Adline Potter 10/19/2011, 2:51 PM 10/19/2011 Fredrich Birks PTA [phone] pager [phone] office

## 2011-10-19 NOTE — Progress Notes (Signed)
SLP Cancellation Note  Treatment cancelled today due to insufficient alertness for participation.  Blenda Mounts Laurice 10/19/2011, 1:26 PM

## 2011-10-19 NOTE — Progress Notes (Signed)
Overall stable.  Afebrile.  A/A, F/C on right. Wound clean.  No new rec's .  Cont rehab efforts

## 2011-10-19 NOTE — Progress Notes (Signed)
Name: Jeremy [name] MRN: [id number] DOB: 12-12-1965    LOS: 18  Referring Provider:  Neuro Reason for Referral:  CVA   PULMONARY / CRITICAL CARE MEDICINE  HPI:  46 yo male admitted 10/01/2011 with unsteady gait, and left hand weakness associated with headache and vomiting.  He was found to have Rt basal ganglia ICH.  Events Since Admission: 8/16 Admit with basal ganglia hemorrhage 8/20 Intubated for airway protection and elevated ICP with IVC drain placement 8/21 IVC drain replaced d/t prior drain malfunction 8/29 Pt pulled ventric drain 8/30 Abx started for Tm 103.1 9/02 Transferred to med-surg  Subjective:  No distress. + F/C. Dense L hemiplegia  Vital Signs: Temp:  [97.5 F (36.4 C)-99.1 F (37.3 C)] 97.5 F (36.4 C) (09/03 1820) Pulse Rate:  [70-105] 75  (09/03 1820) Resp:  [18-24] 18  (09/03 1820) BP: (134-157)/(80-95) 142/83 mmHg (09/03 1820) SpO2:  [97 %-100 %] 100 % (09/03 1820) FiO2 (%):  [28 %] 28 % (09/03 1820)  Intake/Output Summary (Last 24 hours) at 10/19/11 2032 Last data filed at 10/19/11 0527  Gross per 24 hour  Intake      0 ml  Output   1000 ml  Net  -1000 ml    Physical Examination: Gen: No distress HEENT: Trach site clean PULM: Decreased breath sounds, scattered rhonchi CV: s1s2 regular, no murmur AB: soft, non tender, +bowel sounds, PEG site clean Ext: no edema Derm: no rashes Neuro: L facial droop, L hemiplegia  Ct Head Wo Contrast  10/18/2011  *RADIOLOGY REPORT*  Clinical Data: Follow up thalamic hemorrhage  CT HEAD WITHOUT CONTRAST  Technique:  Contiguous axial images were obtained from the base of the skull through the vertex without contrast.  Comparison: 10/15/2011  Findings: Examination is limited by patient motion despite repeating images. Again identified large right thalamic hemorrhage measuring approximately 2.9 x 1.3 cm, previously 3.0 x 1.5 cm. Surrounding edema. Minimal right-to-left midline shift similar to previous exam. Focus  of edema identified in the right frontal lobe at site of prior intraventricular catheter. No definite areas of new hemorrhage or infarction identified. Ventricular system appears stable. No definite extra-axial hemorrhage seen. Visualized sinuses clear.  IMPRESSION: Little change in the size of the right thalamic hemorrhage with surrounding edema and minimal mass effect.   Original Report Authenticated By: Lollie Marrow, M.D.    Dg Chest Port 1 View  10/19/2011  *RADIOLOGY REPORT*  Clinical Data: Tracheostomy tube.  PORTABLE CHEST - 1 VIEW  Comparison: Chest 10/15/2011.  Findings: Tracheostomy tube is in place and appears in good position.  Left IJ catheter has been removed.  There is a new left PICC with the tip projecting over the superior cavoatrial junction. Cardiomegaly is again seen.  No consolidative process, pneumothorax or effusion.  IMPRESSION:  1.  Tip of left PICC projects over the superior cavoatrial junction.  Tracheostomy tube is in good position. 2.  Cardiomegaly without acute disease.   Original Report Authenticated By: Bernadene Bell. D'ALESSIO, M.D.      ASSESSMENT AND PLAN  Principal Problem:  *Malignant hypertension requiring acute intensive management Active Problems:  ICH (intracerebral hemorrhage)  HTN (hypertension)  Hypoxemia  Altered mental status  Cytotoxic cerebral edema  Obstructive hydrocephalus  Acute respiratory failure with hypoxia  Elevated intracranial pressure  Fever S/P Trach/PEG No evidence of acute bacterial infection   D/C abx Cont PT/OT ENT needs to F/U re: trach change D/C planning - I think that he is to go to CIR  when deemed ready medically  Care Mgmt to address this, please   Billy Fischer, MD ; [facility] service Mobile [phone].  After 5:30 PM or weekends, call [phone]

## 2011-10-20 DIAGNOSIS — Z930 Tracheostomy status: Secondary | ICD-10-CM

## 2011-10-20 DIAGNOSIS — R1310 Dysphagia, unspecified: Secondary | ICD-10-CM | POA: Diagnosis not present

## 2011-10-20 LAB — GLUCOSE, CAPILLARY
Glucose-Capillary: 108 mg/dL — ABNORMAL HIGH (ref 70–99)
Glucose-Capillary: 81 mg/dL (ref 70–99)

## 2011-10-20 MED ORDER — JEVITY 1.2 CAL PO LIQD
1000.0000 mL | ORAL | Status: DC
  Administered 2011-10-20: 14:00:00
  Administered 2011-10-21: 1000 mL
  Filled 2011-10-20 (×4): qty 1000

## 2011-10-20 MED ORDER — JEVITY 1.2 CAL PO LIQD: 1000.0000 mL | ORAL | Status: DC

## 2011-10-20 NOTE — Care Management Note (Signed)
    Page 1 of 2   10/22/2011     5:01:17 PM   CARE MANAGEMENT NOTE 10/22/2011  Patient:  Jeremy [name], Jeremy [name]   Account Number:  [id number]  Date Initiated:  10/01/2011  Documentation initiated by:  [facility]  Subjective/Objective Assessment:   Admitted with lt sided weakness, garbled speech due to rt basal ganglia hemorrhage.     Action/Plan:   Admitted to ICU  PT/OT  evals once stable   Anticipated DC Date:  10/08/2011   Anticipated DC Plan:  IP REHAB FACILITY      DC Planning Services  CM consult      Choice offered to / List presented to:             Status of service:  Completed, signed off Medicare Important Message given?   (If response is "NO", the following Medicare IM given date fields will be blank) Date Medicare IM given:   Date Additional Medicare IM given:    Discharge Disposition:  IP REHAB FACILITY  Per UR Regulation:  Reviewed for med. necessity/level of care/duration of stay  If discussed at Long Length of Stay Meetings, dates discussed:   10/12/2011  10/14/2011    Comments:  10/20/11 Onnie Boer, RN, BSN 1624 AWAITING INSURANCE AUTH FOR CIR, FAXING OUT FOR SNF AS A BACK UP PLAN  10/12/11 Tracheostomy on 10/11/11, weaning ventric drain over next 2 days, PT/Ot to evaluate once medically able to participate. CM will continue to follow. Jacquelynn Cree RN, BSN, CCM   10/08/11 Unable to wean off vent,planning trach-unable to perform at bedside -  has IVC drain.Cm will continue to follow.Deatra Canter RN, BSN, CCM  10/05/11 Patient intubated today due to increased somulence, CT showed slightly enlarged hematoma.  CM will continue to follow for d/c needs. Jacquelynn Cree RN, BSN, CCM

## 2011-10-20 NOTE — Progress Notes (Signed)
Physical Therapy Treatment Patient Details Name: Jeremy [name] MRN: [id number] DOB: 08-06-1965 Today's Date: 10/20/2011 Time: 2952-8413 PT Time Calculation (min): 38 min  PT Assessment / Plan / Recommendation Comments on Treatment Session  Pt stated/mouthed "I want chair." Therefore pt was transfered to recliner with maximove and able to tolerate sitting upright.  Pt became emotional at end of session and wrote call mom, mom's name and phone number.  Continue to recommend inpatient rehab for d/c.    Follow Up Recommendations  Inpatient Rehab;Supervision/Assistance - 24 hour    Barriers to Discharge        Equipment Recommendations  Defer to next venue    Recommendations for Other Services Rehab consult  Frequency Min 4X/week   Plan Discharge plan remains appropriate;Frequency remains appropriate    Precautions / Restrictions Precautions Precautions: Fall Precaution Comments: Patient with multiple lines   Pertinent Vitals/Pain No c/o pain    Mobility  Bed Mobility Bed Mobility: Rolling Right;Rolling Left Rolling Right: 1: +2 Total assist Rolling Right: Patient Percentage: 20% Rolling Left: 1: +2 Total assist;With rail Rolling Left: Patient Percentage: 40% Details for Bed Mobility Assistance: +2 (A) to initiate rolling and complete roll.  Cues for hand and LE placement  Transfers Transfer via Lift Equipment: Maximove Details for Transfer Assistance: Used maximove to transfer to recliner.  Pt stated "I want to go to chair." Ambulation/Gait Ambulation/Gait Assistance: Not tested (comment) Modified Rankin (Stroke Patients Only) Pre-Morbid Rankin Score: No symptoms Modified Rankin: Severe disability    Exercises     PT Diagnosis:    PT Problem List:   PT Treatment Interventions:     PT Goals Acute Rehab PT Goals PT Goal Formulation: With patient Time For Goal Achievement: 10/28/11 Potential to Achieve Goals: Fair Pt will go Supine/Side to Sit: with mod assist PT  Goal: Supine/Side to Sit - Progress: Not met  Visit Information  Last PT Received On: 10/20/11 Assistance Needed: +3 or more    Subjective Data  Subjective: Pt mouthed "I want to get in chair."  "Call mom." Patient Stated Goal: Pt continues to state go downstairs   Cognition  Overall Cognitive Status: Impaired Area of Impairment: Memory;Following commands;Safety/judgement;Awareness of deficits;Awareness of errors;Problem solving;Attention Arousal/Alertness: Awake/alert Orientation Level: Appears intact for tasks assessed Behavior During Session: [name] for tasks performed Current Attention Level: Sustained Following Commands: Follows one step commands consistently;Follows multi-step commands inconsistently Safety/Judgement: Decreased awareness of safety precautions;Decreased safety judgement for tasks assessed Awareness of Errors: Assistance required to identify errors made Cognition - Other Comments: Pt very verbal this session.  Pt able to write call mom, Jeremy [name] and I want downstairs.     Balance     End of Session PT - End of Session Equipment Utilized During Treatment: Oxygen (trach) Activity Tolerance: Patient tolerated treatment well Patient left: in chair;with call bell/phone within reach Nurse Communication: Mobility status;Need for lift equipment   GP     Jeremy [name] 10/20/2011, 2:10 PM Jeremy [name], Jeremy [name] DPT [phone]

## 2011-10-20 NOTE — Progress Notes (Signed)
@   Around 2030 pt vomited again in moderate amount. Tube feeding was still on hold. Pt was suctioned and cleaned trach site.In no acute distress. No altered mental status. He nodded that he is ok. RT came to assess pt. Zofran 4mg  IV given. Dr. Thad Ranger (on call MD) was notified and ordered to keep holding jevity tube feeding over the night til am. Will continue to monitor.

## 2011-10-20 NOTE — Progress Notes (Signed)
During the shift change at 1915 patient began projectile vomiting what appeared to be his tube feeding.   Jevity tube feeds were stopped.  RT called for assistance in assessing trach.   On-coming nurse notified of vomiting.  Patient communicated he was ok by giving a "thumbs up".

## 2011-10-20 NOTE — Progress Notes (Addendum)
Nutrition Follow-up  Intervention:    Consider adding Reglan if continues to have N/V  Assessment:   Patient is currently on trach collar due to hypertensive right thalamic intracerebral hemorrhage with intraventricular extension with mild hydrocephalus and cytotoxic cerebral edema. D/C plan is to CIR.  Pt is NPO with no TF infusing. Per RN pt had projectile vomiting last night. He has had no vomiting today and did have a bm last night. Per MD will restart TF at 1/2 of goal rate - 30 ml/hr and if tolerates will increase to goal tomorrow - 60 ml/hr Jevity 1.2  Pt also has ordered 30 ml Prostat TID and 230 ml H2O QID.  TF regimen will provide: 2028 kcal, 125 grams protein, 2086 ml H2O.  Last bm: 10/19/11  Diet Order:  NPO  Meds: Scheduled Meds:    . antiseptic oral rinse  15 mL Mouth Rinse Q2H  . chlorhexidine  15 mL Mouth Rinse BID  . enoxaparin (LOVENOX) injection  60 mg Subcutaneous Q24H  . feeding supplement (JEVITY 1.2 CAL)  1,000 mL Per Tube Q24H  . feeding supplement  30 mL Per Tube TID  . free water  230 mL Per Tube QID  . metoprolol tartrate  25 mg Per Tube Q12H  . neomycin-bacitracin-polymyxin   Topical Daily  . senna-docusate  1 tablet Oral BID  . sodium chloride  10-40 mL Intracatheter Q12H   Continuous Infusions:    . sodium chloride 1,000 mL (10/18/11 1025)  . DISCONTD: feeding supplement (JEVITY 1.2 CAL) 1,000 mL (10/18/11 1221)  . DISCONTD: feeding supplement (JEVITY 1.2 CAL)     PRN Meds:.acetaminophen, albuterol, hydrALAZINE, ondansetron, oxyCODONE, sodium chloride  Labs:  CMP     Component Value Date/Time   NA 136 10/19/2011 0815   K 4.3 10/19/2011 0815   CL 101 10/19/2011 0815   CO2 27 10/19/2011 0815   GLUCOSE 135* 10/19/2011 0815   BUN 18 10/19/2011 0815   CREATININE 0.70 10/19/2011 0815   CALCIUM 9.8 10/19/2011 0815   PROT 8.6* 10/02/2011 2206   ALBUMIN 3.5 10/02/2011 2206   AST 24 10/02/2011 2206   ALT 22 10/02/2011 2206   ALKPHOS 96 10/02/2011 2206   BILITOT  0.3 10/02/2011 2206   GFRNONAA >90 10/19/2011 0815   GFRAA >90 10/19/2011 0815   CBG (last 3)   Basename 10/20/11 0420 10/20/11 0026 10/19/11 2021  GLUCAP 81 108* 92   Sodium  Date/Time Value Range Status  10/19/2011  8:15 AM 136  135 - 145 mEq/L Final  10/18/2011  2:45 AM 137  135 - 145 mEq/L Final  10/17/2011  5:03 AM 139  135 - 145 mEq/L Final    Potassium  Date/Time Value Range Status  10/19/2011  8:15 AM 4.3  3.5 - 5.1 mEq/L Final  10/18/2011  2:45 AM 3.8  3.5 - 5.1 mEq/L Final  10/17/2011  5:03 AM 3.4* 3.5 - 5.1 mEq/L Final    Phosphorus  Date/Time Value Range Status  10/15/2011  6:20 AM 3.7  2.3 - 4.6 mg/dL Final  1/61/0960  4:54 AM 2.6  2.3 - 4.6 mg/dL Final  0/98/1191 47:82 AM 1.5* 2.3 - 4.6 mg/dL Final    Magnesium  Date/Time Value Range Status  10/15/2011  6:20 AM 2.2  1.5 - 2.5 mg/dL Final  9/56/2130  8:65 AM 2.0  1.5 - 2.5 mg/dL Final  7/84/6962 95:28 AM 2.1  1.5 - 2.5 mg/dL Final    Intake/Output Summary (Last 24 hours) at 10/20/11 1418  Last data filed at 10/20/11 0955  Gross per 24 hour  Intake    230 ml  Output    950 ml  Net   -720 ml    Weight Status:  272 lbs on admission with no recent wt changes PTA per wife.  266 lbs 9/2 278 lbs 9/1 259 lbs 8/31  Re-estimated needs:  2000-2400 kcal; 115-130 grams protein  Nutrition Dx:  Inadequate oral intake r/t inability to eat AEB NPO status; ongoing.  Goal: Pt to meet >/= 90% of their estimated nutrition needs; not met now that TF decreased.  Monitor:  weight, TF tolerance, labs   Kendell Bane RD, LDN, CNSC [phone] Pager [phone] After Hours Pager

## 2011-10-20 NOTE — Progress Notes (Signed)
Occupational Therapy Treatment Patient Details Name: Jeremy [name] MRN: [id number] DOB: 03-20-1965 Today's Date: 10/20/2011 Time: 4098-1191 OT Time Calculation (min): 36 min  OT Assessment / Plan / Recommendation Comments on Treatment Session Pt with increased activity tolerance today but continues to require +2 assist.      Follow Up Recommendations  Skilled nursing facility    Barriers to Discharge       Equipment Recommendations  Defer to next venue    Recommendations for Other Services    Frequency Min 2X/week   Plan Discharge plan remains appropriate    Precautions / Restrictions Precautions Precautions: Fall Precaution Comments: Patient with multiple lines   Pertinent Vitals/Pain See vitals    ADL  Transfers/Ambulation Related to ADLs: Maxi move used to transfer pt from bed to w/c ADL Comments: Pt following one step commands consistently today during therapy.  While in maxi move lift, pt with episode of urinary incontinence. +1 total assist for peri care.  No L UE grip noted during session.    OT Diagnosis:    OT Problem List:   OT Treatment Interventions:     OT Goals ADL Goals Pt Will Perform Grooming: Supine, head of bed up;Supported;with cueing (comment type and amount);with mod assist Miscellaneous OT Goals Miscellaneous OT Goal #1: PT will tolerate EOB sitting for ~15 minutes with min (A) as precursor to adls Miscellaneous OT Goal #2: PT will complete bed mobility log roll to the left side Min (A) as precursor to ADLS OT Goal: Miscellaneous Goal #2 - Progress: Progressing toward goals Miscellaneous OT Goal #3: Pt will follow simple commands consistently 80 % of session OT Goal: Miscellaneous Goal #3 - Progress: Progressing toward goals  Visit Information  Last OT Received On: 10/20/11 Assistance Needed: +3 or more    Subjective Data      Prior Functioning       Cognition  Overall Cognitive Status: Impaired Area of Impairment: Memory;Following  commands;Safety/judgement;Awareness of deficits;Awareness of errors;Problem solving;Attention Arousal/Alertness: Awake/alert Orientation Level: Appears intact for tasks assessed Behavior During Session: [facility] for tasks performed Current Attention Level: Sustained Following Commands: Follows one step commands consistently;Follows multi-step commands inconsistently Safety/Judgement: Decreased awareness of safety precautions;Decreased safety judgement for tasks assessed Awareness of Errors: Assistance required to identify errors made Cognition - Other Comments: Demonstrated increased ability to follow commands during bed mobility. Pt very verbal this session.  Pt able to write call mom, [name] and I want downstairs.     Mobility  Shoulder Instructions Bed Mobility Bed Mobility: Rolling Right;Rolling Left Rolling Right: 1: +2 Total assist Rolling Right: Patient Percentage: 20% Rolling Left: 1: +2 Total assist;With rail Rolling Left: Patient Percentage: 40% Details for Bed Mobility Assistance: +2 (A) to initiate rolling and complete roll.  Cues for hand and LE placement  Transfers Transfer via Lift Equipment: Maximove Details for Transfer Assistance: Used maximove to transfer to recliner.  Pt stated "I want to go to chair."       Exercises      Balance     End of Session OT - End of Session Equipment Utilized During Treatment:  (maximove) Activity Tolerance: Patient tolerated treatment well Patient left: in chair;with call bell/phone within reach Nurse Communication: Need for lift equipment  GO    10/20/2011 Cipriano Mile OTR/L Pager [phone] Office [phone]  Cipriano Mile 10/20/2011, 3:43 PM

## 2011-10-20 NOTE — Progress Notes (Signed)
Stroke Team Progress Note  HISTORY [name] is a 46 y.o. male  who had been c/o of headaches for few days, with nausea and vomiting,  On 10/01/11 morning according to his wife he had visual disturbance and then he vomited followed by left sided weakness approximately 7 AM. Patient arrived to the ED. Head CT showed  Right BG, bleed with some area involved in thalamus. With minimal mass effect.   According to the wife he has stopped taking his BP medications several years ago and does not go the physician as suggested. Previously, he had significant neurological symptoms with significant elevated BP that could have been attributed to hypertensive encephalopathy.   He had right ventriculostomy, later replacement of tube due to blockage, was  taken out by patient 08/29, he remain clinically stable, s/p trachostomy.  SUBJECTIVE Projectile vomiting last night. Tube feedings has been off since then. Has had a BM - last one last night.  Filed Vitals:   10/20/11 0424 10/20/11 0612 10/20/11 0953 10/20/11 1006  BP:  144/96 149/88   Pulse: 65 89 63 66  Temp:  98.3 F (36.8 C) 98.2 F (36.8 C)   TempSrc:  Oral Oral   Resp: 16 16 18 18   Height:      Weight:      SpO2: 99% 99% 97% 95%   CBG (last 3)   Basename 10/20/11 0420 10/20/11 0026 10/19/11 2021  GLUCAP 81 108* 92   Intake/Output from previous day: 09/03 0701 - 09/04 0700 In: -  Out: 550 [Urine:550]  IV Fluid Intake:     . sodium chloride 1,000 mL (10/18/11 1025)  . feeding supplement (JEVITY 1.2 CAL) 1,000 mL (10/18/11 1221)   Diet:   NPO Activity: as tolerated DVT Prophylaxis:  Lovenox 40 mg sq daily   CT head   10/18/2011 Little change in the size of the right thalamic hemorrhage with surrounding edema and minimal mass effect. 10/15/2011 Stable right thalamic acute to subacute hemorrhage measuring 1.4 x  3 cm with mild adjacent edema and mass effect. Interval removal of a right frontal ventriculostomy catheter with  stable minimal hemorrhage along the ventriculostomy tract in the right frontal lobe. The 6 mm dense focus along the tract just inside the frontal skull which may represent foreign body. 10/13/2011 Mild regression and overall expected evolution of right  thalamic hemorrhage. Stable mild edema and mass effect.  Right frontal approach EVD remains in place. Stable to slightly increased blood products along the parenchymal course of the shunt. IVH has resolved. Stable ventricle size and configuration. No new intracranial abnormality  Dg Chest Port 1 View 10/15/2011 Stable portable chest exam 10/12/2011 1. Interval placement of tracheostomy, appropriately positioned without pneumothorax. 2. Low lung volumes. Patchy right greater than left airspace disease which is favored to represent atelectasis. 3. Cardiomegaly without congestive failure.  Neurological Exam : GENERAL EXAM: Patient is in no distress CARDIOVASCULAR: Regular rate and rhythm, no murmurs, no carotid bruits NEUROLOGIC: MENTAL STATUS: awake, alert, language fluent, comprehension intact CRANIAL NERVE: pupils equal and reactive to light, visual fields full to confrontation, UNABLE TO LOOK UP. CONVERGENCE NYSTAGMUS ON ATTEMPTED UPGAZE. DECR SENS ON LEFT FACE. DECR LEFT SHOULDER SHRUG. MOTOR: RUE, RLE FULL STRENGTH. LUE 0/5. LLE 1-/5. SENSORY: DECR ON LUE AND LLE. REFLEXES: LEFT TOE UPGOING.  ASSESSMENT [name] is a 46 y.o. male with hypertensive right thalamic intracerebral hemorrhage with intraventricular extension with mild hydrocephalus and cytotoxic cerebral edema. Hemorrhage secondary to accelerated hypertension,  BP on arrival 164/125.  Neurologically worsening led to IVC placement and intubation. NS had planned for VP shunt , but patient removed himself. Patient stable after removal and remains stable. No indication for shunt per NS. There was some leaking from site; NS added staple. Remains awake and interactive, following  commands. Wife prefers IP rehab; they are following. Pt will projectile vomiting last night; TF held though pt continued to receive free water without difficulty. Bowels moving.  -Fevers resolved. LLL infiltrate on previous CXR. WBC trending down. Antibiotics now stopped.  -VDRF. S/p trach by Dr. Jenne Pane 8/26. On trach collar since 8/28. ST following. PM valve in use.   -History of medication noncompliance. -accelerated hypertension -dysphagia secondary to stroke. PEG placed 8/28 by IR.   -left sided clonus, baclofen added  Hospital day # 19  TREATMENT/PLAN -resume tube feeding at 1/2 rate. If no further vomiting, will increase to full rate tomorrow. -rehab following for possible admission; this is wife's preference.  Annie Main, MSN, RN, ANVP-BC, ANP-BC, [facility] Pager: [phone] 10/20/2011 10:32 AM  Scribe for Dr. Delia Heady, Stroke Center Medical Director, who has personally reviewed chart, pertinent data, examined the patient and developed the plan of care. Pager:  [phone]

## 2011-10-20 NOTE — Progress Notes (Signed)
I have faxed clinical information to [facility] to seek approval to admit pt to inpt rehabilitation this week. I await their decision. Are there plans to downsize his trach in the near future to assist with swallow needs and PMV per SLP recommendations? I will follow up in the morning. Please call me for any questions. [phone]

## 2011-10-20 NOTE — Progress Notes (Signed)
Name: Jeremy [name] MRN: [id number] DOB: October 27, 1965    LOS: 19  Referring Provider:  Neuro Reason for Referral:  CVA   PULMONARY / CRITICAL CARE MEDICINE  HPI:  46 yo male admitted 10/01/2011 with unsteady gait, and left hand weakness associated with headache and vomiting.  He was found to have Rt basal ganglia ICH.  Events Since Admission: 8/16 Admit with basal ganglia hemorrhage 8/20 Intubated for airway protection and elevated ICP with IVC drain placement 8/21 IVC drain replaced d/t prior drain malfunction 8/29 Pt pulled ventric drain 8/30 Abx started for Tm 103.1 9/02 Transferred to med-surg 9/4 Projective vomiting overnight.  TF off.    Subjective:  Projectile vomiting overngiht.   No distress at present.  + F/C. Dense L hemiplegia  Vital Signs: Temp:  [97.5 F (36.4 C)-98.9 F (37.2 C)] 98.2 F (36.8 C) (09/04 0953) Pulse Rate:  [63-89] 66  (09/04 1006) Resp:  [16-24] 18  (09/04 1006) BP: (140-166)/(82-105) 149/88 mmHg (09/04 0953) SpO2:  [93 %-100 %] 95 % (09/04 1006) FiO2 (%):  [28 %] 28 % (09/04 1006)  Intake/Output Summary (Last 24 hours) at 10/20/11 1056 Last data filed at 10/20/11 0955  Gross per 24 hour  Intake    230 ml  Output    950 ml  Net   -720 ml    Physical Examination: Gen: No distress HEENT: Trach site clean PULM: Decreased breath sounds, scattered rhonchi CV: s1s2 regular, no murmur AB: soft, non tender, +bowel sounds, PEG site clean Ext: no edema Derm: no rashes Neuro: L facial droop, L hemiplegia  Dg Chest Port 1 View  10/19/2011  *RADIOLOGY REPORT*  Clinical Data: Tracheostomy tube.  PORTABLE CHEST - 1 VIEW  Comparison: Chest 10/15/2011.  Findings: Tracheostomy tube is in place and appears in good position.  Left IJ catheter has been removed.  There is a new left PICC with the tip projecting over the superior cavoatrial junction. Cardiomegaly is again seen.  No consolidative process, pneumothorax or effusion.  IMPRESSION:  1.  Tip of left  PICC projects over the superior cavoatrial junction.  Tracheostomy tube is in good position. 2.  Cardiomegaly without acute disease.   Original Report Authenticated By: Bernadene Bell. D'ALESSIO, M.D.      ASSESSMENT AND PLAN  Active Problems:  ICH (intracerebral hemorrhage)  HTN (hypertension)  Cytotoxic cerebral edema  Obstructive hydrocephalus  Fever  Tracheostomy dependent  Dysphagia, PEG dependent    Plan:  -D/C abx -Cont PT/OT -ENT needs to F/U re: trach change -D/C planning -  he is ready to go to CIR when he meets the necessary criteria to participate in what they have to offer -fever resolved with removal of old CVL, follow off abx    Canary Brim, NP-C [facility] Pgr: [phone] or [phone]

## 2011-10-21 ENCOUNTER — Inpatient Hospital Stay (HOSPITAL_COMMUNITY): Payer: BC Managed Care – PPO

## 2011-10-21 LAB — GLUCOSE, CAPILLARY: Glucose-Capillary: 104 mg/dL — ABNORMAL HIGH (ref 70–99)

## 2011-10-21 NOTE — Progress Notes (Signed)
Stroke Team Progress Note  HISTORY [name] is a 46 y.o. male  who had been c/o of headaches for few days, with nausea and vomiting,  On 10/01/11 morning according to his wife he had visual disturbance and then he vomited followed by left sided weakness approximately 7 AM. Patient arrived to the ED. Head CT showed  Right BG, bleed with some area involved in thalamus. With minimal mass effect.   According to the wife he has stopped taking his BP medications several years ago and does not go the physician as suggested. Previously, he had significant neurological symptoms with significant elevated BP that could have been attributed to hypertensive encephalopathy.   He had right ventriculostomy, later replacement of tube due to blockage, was  taken out by patient 08/29, he remain clinically stable, s/p trachostomy.  SUBJECTIVE Projectile vomiting last night. Tube feedings has been off since then. Has had a BM - last one last night.  Filed Vitals:   10/21/11 0327 10/21/11 0547 10/21/11 0913 10/21/11 1021  BP:  136/76  156/99  Pulse: 76 76 86 88  Temp:  98.4 F (36.9 C)  98.6 F (37 C)  TempSrc:  Oral  Oral  Resp: 20 18 16 18   Height:      Weight:      SpO2: 100% 100% 100% 98%   CBG (last 3)   Basename 10/20/11 0420 10/20/11 0026 10/19/11 2021  GLUCAP 81 108* 92   Intake/Output from previous day: 09/04 0701 - 09/05 0700 In: 230 [NG/GT:230] Out: 1420 [Urine:1420]  IV Fluid Intake:      . sodium chloride 1,000 mL (10/18/11 1025)  . DISCONTD: feeding supplement (JEVITY 1.2 CAL) 1,000 mL (10/18/11 1221)  . DISCONTD: feeding supplement (JEVITY 1.2 CAL)     Diet:   NPO Activity: as tolerated DVT Prophylaxis:  Lovenox 40 mg sq daily   CT head   10/18/2011 Little change in the size of the right thalamic hemorrhage with surrounding edema and minimal mass effect. 10/15/2011 Stable right thalamic acute to subacute hemorrhage measuring 1.4 x  3 cm with mild adjacent edema and mass  effect. Interval removal of a right frontal ventriculostomy catheter with stable minimal hemorrhage along the ventriculostomy tract in the right frontal lobe. The 6 mm dense focus along the tract just inside the frontal skull which may represent foreign body. 10/13/2011 Mild regression and overall expected evolution of right  thalamic hemorrhage. Stable mild edema and mass effect.  Right frontal approach EVD remains in place. Stable to slightly increased blood products along the parenchymal course of the shunt. IVH has resolved. Stable ventricle size and configuration. No new intracranial abnormality  Dg Chest Port 1 View 10/15/2011 Stable portable chest exam 10/12/2011 1. Interval placement of tracheostomy, appropriately positioned without pneumothorax. 2. Low lung volumes. Patchy right greater than left airspace disease which is favored to represent atelectasis. 3. Cardiomegaly without congestive failure.  Neurological Exam : GENERAL EXAM: Patient is in no distress CARDIOVASCULAR: Regular rate and rhythm, no murmurs, no carotid bruits NEUROLOGIC: MENTAL STATUS: awake, alert, language fluent, comprehension intact CRANIAL NERVE: pupils equal and reactive to light, visual fields full to confrontation, UNABLE TO LOOK UP. CONVERGENCE NYSTAGMUS ON ATTEMPTED UPGAZE. DECR SENS ON LEFT FACE. DECR LEFT SHOULDER SHRUG. MOTOR: RUE, RLE FULL STRENGTH. LUE 0/5. LLE 1-/5. SENSORY: DECR ON LUE AND LLE. REFLEXES: LEFT TOE UPGOING.  ASSESSMENT [name] is a 46 y.o. male with hypertensive right thalamic intracerebral hemorrhage with intraventricular extension  with mild hydrocephalus and cytotoxic cerebral edema. Hemorrhage secondary to accelerated hypertension, BP on arrival 164/125.  Neurologically worsening led to IVC placement and intubation. NS had planned for VP shunt , but patient removed himself. Patient stable after removal and remains stable. No indication for shunt per NS. There was some leaking  from site; NS added staple. Remains awake and interactive, following commands. Wife prefers IP rehab; they are following and have faxed [employer] for approval.   Pt will projectile vomiting 10/19/2011 TF held though pt continued to receive free water without difficulty. Bowels were moving. Half-rate TF restarted yest; pt tolerating without difficulty.  -Fevers resolved. LLL infiltrate on previous CXR. WBC trending down. Antibiotics now stopped.  -VDRF. S/p trach by Dr. Jenne Pane 8/26. On trach collar since 8/28. ST following. PM valve in use. Sutures out this am. Ok to change tube per surgeon.  -History of medication noncompliance. -accelerated hypertension -dysphagia secondary to stroke. PEG placed 8/28 by IR.   -left sided clonus, baclofen added  Hospital day # 20  TREATMENT/PLAN -ok from neuro standpoint to resume tube feeding at 64ml/hr, goal rate.  -rehab following for possible admission; this is wife's preference. Stroke Service will sign off. Follow up with Dr. Pearlean Brownie, Stroke Clinic, in 2 months.   Annie Main, MSN, RN, ANVP-BC, ANP-BC, [facility] Pager: [phone] 10/21/2011 10:44 AM  Scribe for Dr. Delia Heady, Stroke Center Medical Director, who has personally reviewed chart, pertinent data, examined the patient and developed the plan of care. Pager:  [phone]

## 2011-10-21 NOTE — Progress Notes (Signed)
Physical Therapy Treatment Patient Details Name: Jeremy [name] MRN: [id number] DOB: 1965-04-16 Today's Date: 10/21/2011 Time: 4098-1191 PT Time Calculation (min): 34 min  PT Assessment / Plan / Recommendation Comments on Treatment Session  Cognition appears to be improving with good participation. Sitting balance is improving.  Unsure if ambulation or gait is a realistic goal at this point, we may want to focus our efforts on w/c training/scooting for transfers and sitting balance. Will update goals today.     Follow Up Recommendations  Inpatient Rehab    Barriers to Discharge        Equipment Recommendations  Defer to next venue    Recommendations for Other Services Rehab consult  Frequency     Plan Discharge plan remains appropriate;Frequency remains appropriate    Precautions / Restrictions Precautions Precautions: Fall Precaution Comments: trach collar    Mobility  Bed Mobility Bed Mobility: Not assessed (pt upright in chair) Transfers Transfers: Sit to Stand;Stand to Sit Details for Transfer Assistance: attempted partial sit->stand for scooting in the chair, pt with good initiation of trunk flexion with cues and facilitation bilaterall but unable to clear hips at all off the chair Ambulation/Gait Ambulation/Gait Assistance: Not tested (comment) Wheelchair Mobility Wheelchair Mobility: No    Exercises General Exercises - Lower Extremity Ankle Circles/Pumps: AROM;PROM;Left;5 reps (PROM for left and AROM on right) Quad Sets: AROM;Both;5 reps;Seated (trace quad noted on the left) Other Exercises Other Exercises: hip hinges (reclined/supported sitting->upright sitting) with assist x10     PT Goals Acute Rehab PT Goals Pt will go Supine/Side to Sit: with mod assist PT Goal: Supine/Side to Sit - Progress: Goal set today Pt will Sit at [facility] of Bed: with min assist;3-5 min PT Goal: Sit at Edge Of Bed - Progress: Progressing toward goal Pt will go Sit to Supine/Side:  with mod assist PT Goal: Sit to Supine/Side - Progress: Goal set today PT Goal: Sit to Stand - Progress: Discontinued (comment) PT Goal: Stand to Sit - Progress: Discontinued (comment) Pt will Transfer Bed to Chair/Chair to Bed: with max assist (scooting transfer) PT Transfer Goal: Bed to Chair/Chair to Bed - Progress: Revised due to lack of progress PT Goal: Stand - Progress: Discontinued (comment) Pt will Propel Wheelchair: 10 - 50 feet;with max assist PT Goal: Propel Wheelchair - Progress: Goal set today Additional Goals Additional Goal #1: Pt will demonstrate ability to sit EOB with good sitting balance, needing minA for stability as he reaches 2 inches outside of his BOS laterally, across midline and in front of him.  PT Goal: Additional Goal #1 - Progress: Goal set today  Visit Information  Last PT Received On: 10/21/11 Assistance Needed: +2    Subjective Data  Subjective: Pt mouthed "I want to get up and walk"   Cognition  Overall Cognitive Status: Impaired Area of Impairment: Attention;Following commands;Safety/judgement;Awareness of errors;Awareness of deficits;Problem solving Arousal/Alertness: Awake/alert Orientation Level: Appears intact for tasks assessed Behavior During Session: [facility] for tasks performed Current Attention Level: Sustained Following Commands: Follows one step commands consistently;Follows multi-step commands inconsistently Safety/Judgement: Decreased awareness of safety precautions;Decreased safety judgement for tasks assessed;Impulsive Awareness of Errors: Assistance required to identify errors made;Assistance required to correct errors made    Balance  Balance Balance Assessed: Yes Static Sitting Balance Static Sitting - Balance Support: Right upper extremity supported;Feet supported Static Sitting - Level of Assistance: 5: Stand by assistance Static Sitting - Comment/# of Minutes: Pt sat edge of chair multiple times during the session without  posterior support, cues  for use of RUE to support himself on the arm chair and for midline/forward posture, cues for attention back to task, easily distracted Dynamic Sitting Balance Dynamic Sitting - Balance Support: No upper extremity supported;Feet supported Dynamic Sitting - Level of Assistance: 4: Min assist;3: Mod assist Reach (Patient is able to reach ___ inches to right, left, forward, back): pt reaching with RUE forward and minimally to the right needing only minA for stability however when pt asked to cross midline with reaching pt having a difficult time following with his vision and needing more modA for support Dynamic Sitting - Balance Activities: Lateral lean/weight shifting;Forward lean/weight shifting Dynamic Sitting - Comments: pt performed hip hinges from reclined position to upright sitting x10 throughout session progressing from +2totalpt50% ->minA with practice; pt initially needing bilateral facilitation as well as hand over hand for sequencing/problem solving and engagement of abdominals for hip/trunk forward, with practice pt able to reach for armrest with minimal verbal cueing and min tactile facilitation to bring trunk forward and to midline to sit edge of chair with good upright posture  End of Session PT - End of Session Equipment Utilized During Treatment: Gait belt Activity Tolerance: Patient tolerated treatment well Patient left: in chair;with family/visitor present Nurse Communication: Mobility status;Need for lift equipment   GP     [facility] HELEN 10/21/2011, 4:17 PM

## 2011-10-21 NOTE — Progress Notes (Signed)
Occupational Therapy Treatment Patient Details Name: Jeremy [name] MRN: [id number] DOB: [birth year] Today's Date: 10/21/2011 Time: 6213-0865 OT Time Calculation (min): 34 min  OT Assessment / Plan / Recommendation Comments on Treatment Session Focus of session on sitting balance at edge of chair.  Pt is making great progress and is following one step commands consistently.  Pt motivated to participate in therapy.    Follow Up Recommendations  Inpatient Rehab    Barriers to Discharge       Equipment Recommendations  Defer to next venue    Recommendations for Other Services Rehab consult  Frequency Min 2X/week   Plan Discharge plan needs to be updated    Precautions / Restrictions Precautions Precautions: Fall Precaution Comments: trach collar   Pertinent Vitals/Pain n/a    ADL  Transfers/Ambulation Related to ADLs: Pt up in chair upon OT arrival. RN used maxi move as recommended by OT/PT. ADL Comments: Pt up in chair upon OT arrival.  Focus of session on static and dynamic sitting balance tasks (as precursor for ADLs) sitting edge of chair with mod-max verbal and tactile cues to facilitate anterior weight shift. Pt performed anterior weight shift exercise x10. When reaching for therapist at various distances and heights, pt required min-mod assist to maintain balance.  See sitting balance for further comments.  No LUE AROM noted during session.  Positioned LUE on pillow in chair to prevent it from becoming wedged between armrest and pt's trunk.   OT Diagnosis:    OT Problem List:   OT Treatment Interventions:     OT Goals ADL Goals Pt Will Perform Upper Body Bathing: with min assist;Sitting, edge of bed;Sitting, chair;Supported (min verbal cueing) ADL Goal: Upper Body Bathing - Progress: Goal set today Miscellaneous OT Goals Miscellaneous OT Goal #1: PT will tolerate EOB sitting for ~15 minutes with min (A) as precursor to adls OT Goal: Miscellaneous Goal #1 - Progress:  Progressing toward goals Miscellaneous OT Goal #3: Pt will follow simple commands consistently 80 % of session OT Goal: Miscellaneous Goal #3 - Progress: Met Miscellaneous OT Goal #4: Pt will demonstrate selective attention 75% of time during ADL task. OT Goal: Miscellaneous Goal #4 - Progress: Goal set today  Visit Information  Last OT Received On: 10/21/11 Assistance Needed: +2 PT/OT Co-Evaluation/Treatment: Yes    Subjective Data      Prior Functioning       Cognition  Overall Cognitive Status: Impaired Area of Impairment: Attention;Following commands;Safety/judgement;Awareness of errors;Awareness of deficits;Problem solving Arousal/Alertness: Awake/alert Orientation Level: Appears intact for tasks assessed Behavior During Session: [facility] for tasks performed Current Attention Level: Sustained Following Commands: Follows one step commands consistently;Follows multi-step commands inconsistently Safety/Judgement: Decreased awareness of safety precautions;Decreased safety judgement for tasks assessed;Impulsive Awareness of Errors: Assistance required to identify errors made;Assistance required to correct errors made    Mobility  Shoulder Instructions Bed Mobility Bed Mobility: Not assessed (pt upright in chair) Transfers Details for Transfer Assistance: attempted partical sit->stand for scooting in the chair, pt with good initiation of trunk flexion with cues and facilitation bilaterall but unable to clear hips at all off the chair       Exercises     Balance Balance Balance Assessed: Yes Static Sitting Balance Static Sitting - Balance Support: Right upper extremity supported;Feet supported Static Sitting - Level of Assistance: 5: Stand by assistance Static Sitting - Comment/# of Minutes: Pt sat edge of chair multiple times during the session without back support, cues for use of RUE to support  himself on the arm chair and for midline/forward posture, cues for attention back  to task, easily distracted Dynamic Sitting Balance Dynamic Sitting - Balance Support: No upper extremity supported;Feet supported Dynamic Sitting - Level of Assistance: 4: Min assist;3: Mod assist Reach (Patient is able to reach ___ inches to right, left, forward, back): pt reaching with RUE forward and minimally to the right needing only minA for stability however when pt asked to cross midline with reaching pt having a difficult time following with his vision and needing more modA for support Dynamic Sitting - Balance Activities: Lateral lean/weight shifting;Forward lean/weight shifting Dynamic Sitting - Comments: pt performed hip hinges from reclined position to upright sitting x10 throughout session progressing from +2totalpt50% ->minA with practice; pt initially needing bilateral facilitation as well as hand over hand for sequencing/problem solving and engagement of abdominals for hip/trunk forward, with practice pt able to reach for armrest with minimal verbal cueing and min tactile facilitation to bring trunk forward and to midline to sit edge of chair with good upright posture   End of Session OT - End of Session Equipment Utilized During Treatment: Gait belt Activity Tolerance: Patient tolerated treatment well Patient left: in chair;with call bell/phone within reach;with family/visitor present Nurse Communication: Mobility status  GO    10/21/2011 Cipriano Mile OTR/L Pager [phone] Office [phone]  Cipriano Mile 10/21/2011, 4:12 PM

## 2011-10-21 NOTE — Progress Notes (Signed)
Passy-Muir Speaking Valve - Treatment Patient Details  Name: Jeremy [name] MRN: [id number] Date of Birth: 1965/11/22  Today's Date: 10/21/2011 Time:  -     Past Medical History:  Past Medical History  Diagnosis Date  . Hypertension   . Stroke    Past Surgical History:  Past Surgical History  Procedure Date  . Tracheostomy tube placement 10/11/2011    Procedure: TRACHEOSTOMY;  Surgeon: Christia Reading, MD;  Location: [facility] OR;  Service: ENT;  Laterality: N/A;    Assessment / Plan / Recommendation Clinical Impression  Aphonia persists with PMSV in place due to 8 cuffed trach. Pt is unable to redirect sufficient air to upperairway evan with max cues to achieve phonation. Pt has had sutures removed but trach change is still pending. MD reports it is ordered for today. Will plan to visit pt tomorrow and complete FEES at bedside if trach is downsized.     Plan  Continue with current plan of care    Follow Up Recommendations  Inpatient Rehab    Pertinent Vitals/Pain NA    SLP Goals Potential to Achieve Goals: Good Progress/Goals/Alternative treatment plan discussed with pt/caregiver and they: Agree   PMSV Trial  PMSV was placed for: 1-2 minute intervals Able to redirect subglottic air through upper airway: Yes Able to Attain Phonation: No Voice Quality: Aphonic Able to Expectorate Secretions: Yes Level of Secretion Expectoration with PMSV: Tracheal Breath Support for Phonation: Moderately decreased Intelligibility: Intelligibility reduced Word: 0-24% accurate Phrase: 0-24% accurate Sentence: 0-24% accurate Conversation: 0-24% accurate Behavior: Alert   Tracheostomy Tube  Additional Tracheostomy Tube Assessment Trach Collar Period: During waking hours since 8/27 Secretion Description: thin, yellow Level of Secretion Expectoration: Tracheal    Vent Dependency  Nocturnal Vent: No    Cuff Deflation Trial  GO    Jeremy Gorder, MA CCC-SLP [phone]  Tolerated Cuff  Deflation: Yes Length of Time for Cuff Deflation Trial: baseline   Jeremy [name], [name] 10/21/2011, 3:00 PM

## 2011-10-21 NOTE — Progress Notes (Signed)
Name: Tajay Muzzy MRN: [id number] DOB: 1965/05/19    LOS: 20  Referring Provider:  Neuro Reason for Referral:  CVA   PULMONARY / CRITICAL CARE MEDICINE   HPI:  46 yo male admitted 10/01/2011 with unsteady gait, and left hand weakness associated with headache and vomiting.  He was found to have Rt basal ganglia ICH.  Events Since Admission: 8/16 Admit with basal ganglia hemorrhage 8/20 Intubated for airway protection and elevated ICP with IVC drain placement 8/21 IVC drain replaced d/t prior drain malfunction 8/27 Trach (Dr Jenne Pane) 8/28 PEG (IR) 8/29 Pt pulled ventric drain 8/30 Abx started for Tm 103.1 9/2 Transferred to med-surg 9/3 CVL removed. PICC placed 9/4 Projective vomiting overnight.  TF off.   9/5 Trach changed to 6 uncuffed Shiley 9/5 TFs resumed @ reduced rate 9/5 PMV trials ordered. FEES per SLP   Subjective:  Cognition intact. Vomiting last night, TF reduced rate.   No distress at present.  + F/C. Dense L hemiplegia  Vital Signs: Temp:  [98.4 F (36.9 C)-98.9 F (37.2 C)] 98.6 F (37 C) (09/05 1021) Pulse Rate:  [67-99] 88  (09/05 1021) Resp:  [16-20] 18  (09/05 1021) BP: (136-156)/(76-99) 156/99 mmHg (09/05 1021) SpO2:  [97 %-100 %] 98 % (09/05 1021) FiO2 (%):  [28 %] 28 % (09/05 0913)  Intake/Output Summary (Last 24 hours) at 10/21/11 1030 Last data filed at 10/21/11 0226  Gross per 24 hour  Intake      0 ml  Output   1020 ml  Net  -1020 ml    Physical Examination: Gen: No distress HEENT: Trach site midline, mucopurulent secretions PULM: clear anteriorly CV: s1s2 regular, no murmur AB: soft, non tender, +bowel sounds, PEG site clean Ext: no edema Derm: no rashes Neuro: L facial droop, L hemiplegia  No results found.   ASSESSMENT AND PLAN  Active Problems:  ICH (intracerebral hemorrhage)  HTN (hypertension)  Cytotoxic cerebral edema  Obstructive hydrocephalus  Fever  Tracheostomy dependent  Dysphagia, PEG dependent   Acute  respiratory failure s/p trach in the setting of ICH.    Plan:  -Cont PT/OT -ENT removed sutures 9/5, ok to change trach PRN per ENT -D/C planning -  he is ready to go to CIR when he meets the necessary criteria to participate in what they have to offer -fever resolved with removal of old CVL, follow off abx  Dysphagia / Nausea / Vomiting  Plan: -ST to eval for PMV use  -continue TF but at lower rate -PRN zofran -KUB  Repeat labs for am.  Check KUB  Canary Brim, NP-C [facility] Pgr: [phone] or [phone]    Agree with above note which I have amended   Billy Fischer, MD ; [facility] service Mobile [phone].  After 5:30 PM or weekends, call [phone]

## 2011-10-21 NOTE — Progress Notes (Signed)
Patient ID: Jeremy [name], male   DOB: May 31, 1965, 46 y.o.   MRN: [id number] Sutures removed from trach tube.  Wound a bit soupy but otherwise fine. OK to change trach tube when needed.  Local wound care.

## 2011-10-21 NOTE — Procedures (Signed)
Tracheostomy Change Note  Patient Details:   Name: Jeremy [name] DOB: [birth year] MRN: [id number]    Airway Documentation:     Evaluation  O2 sats: stable throughout and currently acceptable Complications: No apparent complications Patient did tolerate procedure well. Bilateral Breath Sounds: Clear;Diminished Suctioning: Airway  Good color change on ETCO2.  Arloa Koh 10/21/2011, 1:29 PM2

## 2011-10-21 NOTE — Progress Notes (Signed)
[employer] has requested updated therapy goals and progress today to assess need for CIR. I have requested this of PT and OT today. [phone]

## 2011-10-22 ENCOUNTER — Inpatient Hospital Stay (HOSPITAL_COMMUNITY)
Admission: RE | Admit: 2011-10-22 | Discharge: 2011-11-16 | DRG: 462 | Disposition: A | Discharge status: Skilled Nursing Facility | Payer: BC Managed Care – PPO | Source: Ambulatory Visit | Attending: Physical Medicine & Rehabilitation | Admitting: Physical Medicine & Rehabilitation

## 2011-10-22 ENCOUNTER — Encounter (HOSPITAL_COMMUNITY): Payer: Self-pay | Admitting: Pulmonary Disease

## 2011-10-22 DIAGNOSIS — H532 Diplopia: Secondary | ICD-10-CM

## 2011-10-22 DIAGNOSIS — R252 Cramp and spasm: Secondary | ICD-10-CM

## 2011-10-22 DIAGNOSIS — Z5189 Encounter for other specified aftercare: Principal | ICD-10-CM

## 2011-10-22 DIAGNOSIS — G8190 Hemiplegia, unspecified affecting unspecified side: Secondary | ICD-10-CM

## 2011-10-22 DIAGNOSIS — Z8673 Personal history of transient ischemic attack (TIA), and cerebral infarction without residual deficits: Secondary | ICD-10-CM

## 2011-10-22 DIAGNOSIS — Z931 Gastrostomy status: Secondary | ICD-10-CM

## 2011-10-22 DIAGNOSIS — R471 Dysarthria and anarthria: Secondary | ICD-10-CM

## 2011-10-22 DIAGNOSIS — R1310 Dysphagia, unspecified: Secondary | ICD-10-CM

## 2011-10-22 DIAGNOSIS — R491 Aphonia: Secondary | ICD-10-CM

## 2011-10-22 DIAGNOSIS — I619 Nontraumatic intracerebral hemorrhage, unspecified: Secondary | ICD-10-CM

## 2011-10-22 DIAGNOSIS — J9600 Acute respiratory failure, unspecified whether with hypoxia or hypercapnia: Secondary | ICD-10-CM

## 2011-10-22 DIAGNOSIS — G936 Cerebral edema: Secondary | ICD-10-CM

## 2011-10-22 DIAGNOSIS — Z430 Encounter for attention to tracheostomy: Secondary | ICD-10-CM

## 2011-10-22 DIAGNOSIS — I10 Essential (primary) hypertension: Secondary | ICD-10-CM

## 2011-10-22 DIAGNOSIS — D62 Acute posthemorrhagic anemia: Secondary | ICD-10-CM

## 2011-10-22 DIAGNOSIS — R29810 Facial weakness: Secondary | ICD-10-CM

## 2011-10-22 DIAGNOSIS — F09 Unspecified mental disorder due to known physiological condition: Secondary | ICD-10-CM

## 2011-10-22 LAB — CULTURE, BLOOD (ROUTINE X 2)
Culture: NO GROWTH
Culture: NO GROWTH

## 2011-10-22 LAB — CBC
HCT: 30.3 % — ABNORMAL LOW (ref 39.0–52.0)
Hemoglobin: 9.9 g/dL — ABNORMAL LOW (ref 13.0–17.0)
MCH: 28.9 pg (ref 26.0–34.0)
MCHC: 32.7 g/dL (ref 30.0–36.0)
MCV: 88.6 fL (ref 78.0–100.0)
Platelets: 363 10*3/uL (ref 150–400)
RBC: 3.42 MIL/uL — ABNORMAL LOW (ref 4.22–5.81)
RDW: 13.2 % (ref 11.5–15.5)
WBC: 8.2 10*3/uL (ref 4.0–10.5)

## 2011-10-22 LAB — BASIC METABOLIC PANEL
BUN: 20 mg/dL (ref 6–23)
CO2: 27 mEq/L (ref 19–32)
Calcium: 9.9 mg/dL (ref 8.4–10.5)
Chloride: 101 mEq/L (ref 96–112)
Creatinine, Ser: 0.71 mg/dL (ref 0.50–1.35)
GFR calc Af Amer: >90 mL/min (ref >90)
GFR calc non Af Amer: >90 mL/min (ref >90)
Glucose, Bld: 105 mg/dL — ABNORMAL HIGH (ref 70–99)
Potassium: 3.8 mEq/L (ref 3.5–5.1)
Sodium: 137 mEq/L (ref 135–145)

## 2011-10-22 LAB — GLUCOSE, CAPILLARY: Glucose-Capillary: 90 mg/dL (ref 70–99)

## 2011-10-22 MED ORDER — METOPROLOL TARTRATE 25 MG/10 ML ORAL SUSPENSION
25.0000 mg | Freq: Two times a day (BID) | ORAL | Status: DC
  Administered 2011-10-22 – 2011-10-27 (×10): 25 mg
  Filled 2011-10-22 (×13): qty 10

## 2011-10-22 MED ORDER — NAPHAZOLINE-GLYCERIN 0.012-0.2 % OP SOLN
2.0000 [drp] | Freq: Four times a day (QID) | OPHTHALMIC | Status: DC
  Administered 2011-10-22 – 2011-11-16 (×86): 2 [drp] via OPHTHALMIC
  Filled 2011-10-22 (×3): qty 15

## 2011-10-22 MED ORDER — BACLOFEN 5 MG HALF TABLET
5.0000 mg | ORAL_TABLET | Freq: Two times a day (BID) | ORAL | Status: DC | PRN
  Filled 2011-10-22: qty 1

## 2011-10-22 MED ORDER — FLEET ENEMA 7-19 GM/118ML RE ENEM: 1.0000 | ENEMA | Freq: Once | RECTAL | Status: AC | PRN

## 2011-10-22 MED ORDER — DICLOFENAC SODIUM 1 % TD GEL
2.0000 g | Freq: Four times a day (QID) | TRANSDERMAL | Status: DC
  Administered 2011-10-22 – 2011-11-16 (×98): 2 g via TOPICAL
  Filled 2011-10-22 (×4): qty 100

## 2011-10-22 MED ORDER — OXYCODONE HCL 5 MG PO TABS
5.0000 mg | ORAL_TABLET | ORAL | Status: DC | PRN
  Administered 2011-10-24 – 2011-10-25 (×3): 10 mg via ORAL
  Administered 2011-10-26 – 2011-11-02 (×14): 5 mg via ORAL
  Administered 2011-11-02: 10 mg via ORAL
  Administered 2011-11-04: 5 mg via ORAL
  Administered 2011-11-05: 10 mg via ORAL
  Administered 2011-11-06 – 2011-11-09 (×5): 5 mg via ORAL
  Administered 2011-11-11 – 2011-11-15 (×3): 10 mg via ORAL
  Filled 2011-10-22: qty 1
  Filled 2011-10-22: qty 2
  Filled 2011-10-22 (×5): qty 1
  Filled 2011-10-22: qty 2
  Filled 2011-10-22 (×5): qty 1
  Filled 2011-10-22: qty 2
  Filled 2011-10-22 (×4): qty 1
  Filled 2011-10-22: qty 2
  Filled 2011-10-22: qty 1
  Filled 2011-10-22: qty 2
  Filled 2011-10-22 (×2): qty 1
  Filled 2011-10-22 (×3): qty 2
  Filled 2011-10-22 (×2): qty 1

## 2011-10-22 MED ORDER — ALUM & MAG HYDROXIDE-SIMETH 200-200-20 MG/5ML PO SUSP
30.0000 mL | ORAL | Status: DC | PRN
  Administered 2011-11-03 – 2011-11-14 (×2): 30 mL via ORAL
  Filled 2011-10-22 (×2): qty 30

## 2011-10-22 MED ORDER — TRAMADOL HCL 50 MG PO TABS
50.0000 mg | ORAL_TABLET | Freq: Four times a day (QID) | ORAL | Status: DC | PRN
  Administered 2011-10-23 – 2011-10-29 (×4): 50 mg via ORAL
  Filled 2011-10-22 (×5): qty 1

## 2011-10-22 MED ORDER — ACETAMINOPHEN 325 MG PO TABS: 650.0000 mg | ORAL_TABLET | Freq: Four times a day (QID) | ORAL | Status: DC | PRN

## 2011-10-22 MED ORDER — BIOTENE DRY MOUTH MT LIQD
15.0000 mL | OROMUCOSAL | Status: DC
  Administered 2011-10-22 – 2011-10-28 (×47): 15 mL via OROMUCOSAL

## 2011-10-22 MED ORDER — ENOXAPARIN SODIUM 40 MG/0.4ML ~~LOC~~ SOLN
40.0000 mg | SUBCUTANEOUS | Status: DC
  Administered 2011-10-22 – 2011-11-15 (×25): 40 mg via SUBCUTANEOUS
  Filled 2011-10-22 (×27): qty 0.4

## 2011-10-22 MED ORDER — SENNOSIDES-DOCUSATE SODIUM 8.6-50 MG PO TABS
1.0000 | ORAL_TABLET | Freq: Two times a day (BID) | ORAL | Status: DC
  Administered 2011-10-22 – 2011-11-16 (×42): 1 via ORAL
  Filled 2011-10-22 (×44): qty 1

## 2011-10-22 MED ORDER — PROCHLORPERAZINE EDISYLATE 5 MG/ML IJ SOLN
5.0000 mg | Freq: Four times a day (QID) | INTRAMUSCULAR | Status: DC | PRN
  Filled 2011-10-22: qty 2

## 2011-10-22 MED ORDER — GUAIFENESIN-DM 100-10 MG/5ML PO SYRP: 5.0000 mL | ORAL_SOLUTION | Freq: Four times a day (QID) | ORAL | Status: DC | PRN

## 2011-10-22 MED ORDER — TRAZODONE HCL 50 MG PO TABS
25.0000 mg | ORAL_TABLET | Freq: Every evening | ORAL | Status: DC | PRN
  Administered 2011-10-22 – 2011-11-04 (×4): 50 mg via ORAL
  Filled 2011-10-22 (×6): qty 1

## 2011-10-22 MED ORDER — CLONIDINE HCL 0.1 MG PO TABS
0.1000 mg | ORAL_TABLET | Freq: Four times a day (QID) | ORAL | Status: DC | PRN
  Administered 2011-11-12: 0.1 mg via ORAL
  Filled 2011-10-22 (×2): qty 1

## 2011-10-22 MED ORDER — PROCHLORPERAZINE 25 MG RE SUPP
12.5000 mg | Freq: Four times a day (QID) | RECTAL | Status: DC | PRN
  Filled 2011-10-22: qty 1

## 2011-10-22 MED ORDER — JEVITY 1.2 CAL PO LIQD
1000.0000 mL | ORAL | Status: DC
  Filled 2011-10-22 (×2): qty 1000

## 2011-10-22 MED ORDER — ACETAMINOPHEN 325 MG PO TABS: 325.0000 mg | ORAL_TABLET | ORAL | Status: DC | PRN

## 2011-10-22 MED ORDER — PROCHLORPERAZINE MALEATE 5 MG PO TABS
5.0000 mg | ORAL_TABLET | Freq: Four times a day (QID) | ORAL | Status: DC | PRN
  Administered 2011-11-10 – 2011-11-15 (×4): 5 mg via ORAL
  Filled 2011-10-22 (×3): qty 2

## 2011-10-22 MED ORDER — DIPHENHYDRAMINE HCL 12.5 MG/5ML PO ELIX: 12.5000 mg | ORAL_SOLUTION | Freq: Four times a day (QID) | ORAL | Status: DC | PRN

## 2011-10-22 MED ORDER — BISACODYL 10 MG RE SUPP
10.0000 mg | Freq: Every day | RECTAL | Status: DC | PRN
  Administered 2011-11-01: 10 mg via RECTAL
  Filled 2011-10-22: qty 1

## 2011-10-22 NOTE — Procedures (Signed)
Objective Swallowing Evaluation: Fiberoptic Endoscopic Evaluation of Swallowing  Patient Details  Name: Jeremy [name] MRN: [id number] Date of Birth: 1965-03-16  Today's Date: 10/22/2011 Time: 1110-1145 SLP Time Calculation (min): 35 min  Past Medical History:  Past Medical History  Diagnosis Date  . Hypertension   . Stroke    Past Surgical History:  Past Surgical History  Procedure Date  . Tracheostomy tube placement 10/11/2011    Procedure: TRACHEOSTOMY;  Surgeon: Christia Reading, MD;  Location: [facility] OR;  Service: ENT;  Laterality: N/A;   HPI:  46 y/o AAM with HTN admitted with R basal ganglia hemorrhage with extension to the right thalamus and posterior third ventricle. Right frontal ventriculostomy placed 8/20 due to obstructive hydrocephalus. Pt intubated from 8/20 to 8/27. Tracheostomy on 10/12/11. Prior to extension, SLP evaluated swallow at bedside and recommended a regualr diet with thin liquids. PEG places 8/30. Pt with dysphonia even with tolerance of PMSV. FEES warranted to determine safety with POs prior to diet initiation.      Assessment / Plan / Recommendation Clinical Impression  Dysphagia Diagnosis: Within Functional Limits Clinical impression: Pt demonstrates normal oral and orophayngeal function with adequate strength, motility and airway closure. No pentration/aspration or residual was observed. Regular diet and thin liquids recommended. Initially, full supervision with meals due to cognitive deifits. Pt was observed to have erythema throughout and on vocal folds. Edematous epiglottis, arytenoids, aryepiglottic folds suggestive of intermittent exposure to reflux. Pt also observed to have white area below right vocal fold, above tracheostomy site. MD notified.     Treatment Recommendation  Therapy as outlined in treatment plan below    Diet Recommendation Regular;Thin liquid   Liquid Administration via: Cup;Straw Medication Administration: Whole meds with  liquid Supervision: Full supervision/cueing for compensatory strategies;Patient able to self feed Compensations: Slow rate;Small sips/bites Postural Changes and/or Swallow Maneuvers: Seated upright 90 degrees;Upright 30-60 min after meal    Other  Recommendations Oral Care Recommendations: Oral care QID Other Recommendations: Place PMSV during PO intake   Follow Up Recommendations  Inpatient Rehab    Frequency and Duration min 2x/week  2 weeks   Pertinent Vitals/Pain NA    SLP Swallow Goals Patient will consume recommended diet without observed clinical signs of aspiration with: Supervision/safety Patient will utilize recommended strategies during swallow to increase swallowing safety with: Supervision/safety   General HPI: 46 y/o AAM with HTN admitted with R basal ganglia hemorrhage with extension to the right thalamus and posterior third ventricle. Right frontal ventriculostomy placed 8/20 due to obstructive hydrocephalus. Pt intubated from 8/20 to 8/27. Tracheostomy on 10/12/11. Prior to extension, SLP evaluated swallow at bedside and recommended a regualr diet with thin liquids. PEG places 8/30. Pt with dysphonia even with tolerance of PMSV. FEES warranted to determine safety with POs prior to diet initiation.  Type of Study: Fiberoptic Endoscopic Evaluation of Swallowing Reason for Referral: Objectively evaluate swallowing function Previous Swallow Assessment: BSE 10/02/11. After several attempts, pt recommended to initiate a regular diet with thin liquids.  Diet Prior to this Study: NPO;PEG tube Temperature Spikes Noted: No Respiratory Status: Trach Trach Size and Type: #6;Uncuffed;With PMSV in place History of Recent Intubation: Yes Length of Intubations (days): 7 days Date extubated: 10/12/11 Behavior/Cognition: Alert;Cooperative;Requires cueing;Confused Oral Cavity - Dentition: Missing dentition Oral Motor / Sensory Function: Within functional limits Self-Feeding Abilities:  Able to feed self Patient Positioning: Upright in bed Baseline Vocal Quality: Hoarse;Low vocal intensity Volitional Cough: Strong Volitional Swallow: Able to elicit Anatomy: Other (Comment) (White spot  observed in subglottis, below right vocal fold) Pharyngeal Secretions: Normal    Reason for Referral Objectively evaluate swallowing function   Oral Phase     Pharyngeal Phase Pharyngeal Phase: Within functional limits   Cervical Esophageal Phase    GO    Cervical Esophageal Phase: [facility]   Harlon Ditty, MA CCC-SLP [phone]  Nikko Goldwire, [name] 10/22/2011, 12:31 PM

## 2011-10-22 NOTE — Plan of Care (Addendum)
Overall Plan of Care (IPOC) Patient Details Name: Jeremy [name] MRN: [id number] DOB: Jun 22, 1965  Diagnosis:  SAH  Primary Diagnosis:    <principal problem not specified> Co-morbidities: AVDRF, HTN, spasticity, dysphagia   Functional Problem List  Patient demonstrates impairments in the following areas: Balance, Cognition, Endurance, Motor, Perception, Safety and Sensory   Basic ADL's: eating, grooming, bathing, dressing and toileting Advanced ADL's: simple meal preparation  Transfers:  bed mobility, bed to chair, toilet, tub/shower and car Locomotion:  ambulation, wheelchair mobility and stairs  Additional Impairments:  Functional use of upper extremity, Communication  comprehension and expression and Social Cognition   social interaction, problem solving, memory, attention and awareness  Anticipated Outcomes Item Anticipated Outcome  Eating/Swallowing  Mod I  Basic self-care  Min to mod assist  Tolieting  Min assist  Bowel/Bladder  Min Assist with bowel/bladder  Transfers  Min   Locomotion  Supervision-min A w/c level  Communication  Min A-supervision  Cognition  Min A  Pain  3 or less on scale 0-10  Safety/Judgment  Min A  Other     Therapy Plan: PT Frequency: 2-3 X/day, 60-90 minutes;5 out of 7 days OT Frequency: 1-2 X/day, 60-90 minutes;5 out of 7 days SLP Frequency: 1-2 X/day, 30-60 minutes   Team Interventions: Item RN PT OT SLP SW TR Other  Self Care/Advanced ADL Retraining   x      Neuromuscular Re-Education  x x      Therapeutic Activities  x x x  x   UE/LE Strength Training/ROM  x x   x   UE/LE Coordination Activities  x x   x   Visual/Perceptual Remediation/Compensation  x x   x   DME/Adaptive Equipment Instruction  x x   x   Therapeutic Exercise  x x   x   Balance/Vestibular Training  x x   x   Patient/Family Education x x x x  x   Cognitive Remediation/Compensation  x x x  x   Functional Mobility Training  x x   x   Ambulation/Gait  Training  x       Stair Training  x       Wheelchair Propulsion/Positioning  x x      Functional Tourist information centre manager Reintegration      x   Dysphagia/Aspiration Landscape architect Facilitation    x     Bladder Management x        Bowel Management x        Disease Management/Prevention         Pain Management x  x      Medication Management x        Skin Care/Wound Management x  x      Splinting/Orthotics         Discharge Planning  x x x x x   Psychosocial Support   x x x x                      Team Discharge Planning: Destination:  Home with 24/7 assistance vs. SNF Projected Follow-up:  PT, OT, SLP and Home Health Projected Equipment Needs:  Walker and Wheelchair TBD as patient progresses Patient/family involved in discharge planning:  No family available  MD ELOS: 4 weeks Medical Rehab Prognosis:  Good Assessment: Pt admitted for CIR therapies. The team will be addressing communication, swallowing,  cognition, nutritoin, breathing, fxnl mobility,spasticity, NMR, CPT, visuoperceptual deficits, ADL's, Goals will range from minimal assist to mod assist.

## 2011-10-22 NOTE — Progress Notes (Signed)
Name: Jeremy [name] MRN: [id number] DOB: August 07, 1965    LOS: 21  Referring Provider:  Neuro Reason for Referral:  CVA   PULMONARY / CRITICAL CARE MEDICINE   HPI:  46 yo male admitted 10/01/2011 with unsteady gait, and left hand weakness associated with headache and vomiting.  He was found to have Rt basal ganglia ICH.  Events Since Admission: 8/16 Admit with basal ganglia hemorrhage 8/20 Intubated for airway protection and elevated ICP with IVC drain placement 8/21 IVC drain replaced d/t prior drain malfunction 8/27 Trach (Dr Jenne Pane) 8/28 PEG (IR) 8/29 Pt pulled ventric drain 8/30 Abx started for Tm 103.1 9/2 Transferred to med-surg 9/3 CVL removed. PICC placed 9/4 Projective vomiting overnight.  TF off.   9/5 Trach changed to 6 uncuffed Shiley 9/5 TFs resumed @ reduced rate 9/5 PMV trials ordered. FEES per SLP   Subjective:  Cognition intact. Vomiting last night, TF reduced rate.   No distress at present.  + F/C. Dense L hemiplegia  Vital Signs: Temp:  [98.6 F (37 C)-99.5 F (37.5 C)] 98.8 F (37.1 C) (09/06 0604) Pulse Rate:  [73-88] 79  (09/06 0604) Resp:  [16-18] 18  (09/06 0604) BP: (138-158)/(74-99) 138/74 mmHg (09/06 0604) SpO2:  [95 %-100 %] 95 % (09/06 0820) FiO2 (%):  [28 %] 28 % (09/06 0820)  Intake/Output Summary (Last 24 hours) at 10/22/11 0926 Last data filed at 10/21/11 2200  Gross per 24 hour  Intake    240 ml  Output      0 ml  Net    240 ml    Physical Examination: Gen: No distress HEENT: Trach site clean, poor dentition PULM: clear anteriorly CV: s1s2 regular, no murmur AB: soft, non tender, +bowel sounds, PEG site clean Ext: no edema Derm: no rashes Neuro: L facial droop, L hemiplegia  Dg Abd Portable 1v  10/21/2011  *RADIOLOGY REPORT*  Clinical Data: Rule out ileus.  Abdominal pain center [place].  PORTABLE ABDOMEN - 1 VIEW  Comparison: None.  Findings: Ostomy tube balloon is seen overlying the mid aspect of the stomach. A  nonobstructive bowel gas pattern is seen with gas throughout the colon to the level of the rectum.  Some residual contrast is present within the rectum.  No small bowel gas is identified.  No free intraperitoneal air is suggested.  There is a 9 mm calcific density identified in the medial right upper quadrant which demonstrates predominately peripheral calcification.  Given the location and peripheral calcification this likely represents a gallstone. A renal calculus would be a secondary consideration.  No prior study includes this area to help differentiate between these possibilities.  IMPRESSION: No evidence for ileus or small bowel obstruction radiographically.  Right upper quadrant calcification likely a gallstone with renal calculus a secondary possibility   Original Report Authenticated By: Bertha Stakes, M.D.    ID report: reviewed Labs: BMET, CBC reviewed  ASSESSMENT AND PLAN  Active Problems:  ICH (intracerebral hemorrhage)  HTN (hypertension)  Cytotoxic cerebral edema  Obstructive hydrocephalus  Fever  Tracheostomy dependent  Dysphagia, PEG dependent   Acute respiratory failure s/p trach in the setting of ICH.    Plan:  -Cont PT/OT -ENT removed sutures 9/5, ok to change trach PRN per ENT -D/C planning -  he is ready to go to CIR when he meets the necessary criteria to participate in what they have to offer -fever resolved with removal of old CVL, follow off abx  Dysphagia Plan: -SLP to eval for PMV  use and FEES    Nausea / Vomiting Improved Advance TFs back to goal   Continue current therapies through [facility]. Awaiting word on placement/rehab options  Billy Fischer, MD ; [facility] [phone].  After 5:30 PM or weekends, call [phone]

## 2011-10-22 NOTE — PMR Pre-admission (Signed)
PMR Admission Coordinator Pre-Admission Assessment  Patient: Jeremy [name] is an 46 y.o., male MRN: [id number] DOB: 25-Jan-1966 Height: 5\' 10"  (177.8 cm) Weight: 121.1 kg (266 lb 15.6 oz)  Insurance Information HMO:     PPO:  yes   PCP:      IPA:      80/20:      OTHER:  PRIMARY: State BCBS      Policy#: [id number]      Subscriber: pt CM Name: Halina Maidens      Phone#: [phone]     Fax#: [phone] Pre-Cert#: [id number]      Employer: [employer] Benefits:  Phone #: [phone]     Name: Elonda Husky 9/3 Eff. Date: 08/16/11     Deduct: $350      Out of Pocket Max: $1600      Life Max: none CIR: $233 per admission copay then 80%      SNF: 80% 100 days max Outpatient: 80%     Co-Pay: $52 per visit with no limits Home Health: 80% non visit limit      Co-Pay: none DME: 80%     Co-Pay: 20% Providers: in network  SECONDARY:      None  Medicaid Application Date:       Case Manager:  Disability Application Date:       Case Worker:   Emergency Conservator, museum/gallery Information    Name Relation Home Work Mobile   Wishram Spouse [phone]  [phone]     Current Medical History  Patient Admitting Diagnosis: left spastic hemiparesis due to right thalamic hemorrhage with obstructive hydrocephalusand cerebral edema.  History of Present Illness: Jeremy [name] is a 46 y.o. male with history of HTN-no medications x years who was admitted on 10/01/11 with headache, N/V, and difficulty using left hand. CT head revealed R-BG hemorrhage with surrounding edema. Started on nicardipine drip for BP management. Patient developed somnolence due to extension of hemorrhage to right thalamus and posterior third ventricle with concerns for developing obstructive hydrocephalus. Patient intubated. Dr. Jordan Likes consulted and IVC placed. Did require replacement on 08/21 due to malfunction. He had difficulty weaning of vent required trach on 08/26 and gastrostomy tube placed by IVR on 08/28. Patient  continues to have drainage form IVC and plans for VPS in the near future. Patient continues with left hemiparesis with spasticity, right gaze preference and showing improvement in mentation. Total: 12   Trach changed to 6 uncuffed shiley 9/5. Using PMV. Fees 9/6 regular with thins  Past Medical History  Past Medical History  Diagnosis Date  . Hypertension   . Stroke     Family History  family history is not on file.  Prior Rehab/Hospitalizations: none  Current Medications  Current facility-administered medications:0.9 %  sodium chloride infusion, , Intravenous, Continuous, Storm Frisk, MD, Last Rate: 20 mL/hr at 10/18/11 1025, 1,000 mL at 10/18/11 1025;  acetaminophen (TYLENOL) tablet 650 mg, 650 mg, Oral, Q6H PRN, Lupita Leash, MD, 650 mg at 10/17/11 2103;  antiseptic oral rinse (BIOTENE) solution 15 mL, 15 mL, Mouth Rinse, Q2H, Storm Frisk, MD, 15 mL at 10/22/11 1000 chlorhexidine (PERIDEX) 0.12 % solution 15 mL, 15 mL, Mouth Rinse, BID, Storm Frisk, MD, 15 mL at 10/22/11 1014;  enoxaparin (LOVENOX) injection 60 mg, 60 mg, Subcutaneous, Q24H, Layne Benton, NP, 60 mg at 10/22/11 1013;  hydrALAZINE (APRESOLINE) injection 10 mg, 10 mg, Intravenous, Q4H PRN, Bernadene Person, NP, 10 mg at 10/18/11 0412 metoprolol tartrate (LOPRESSOR)  25 mg/10 mL oral suspension 25 mg, 25 mg, Per Tube, Q12H, Coralyn Helling, MD, 25 mg at 10/22/11 1014;  ondansetron (ZOFRAN) injection 4 mg, 4 mg, Intravenous, Q6H PRN, Micki Riley, MD, 4 mg at 10/19/11 2103;  oxyCODONE (ROXICODONE) 5 MG/5ML solution 5 mg, 5 mg, Oral, Q6H PRN, Ilda Basset, MD, 5 mg at 10/20/11 1707 senna-docusate (Senokot-S) tablet 1 tablet, 1 tablet, Oral, BID, Minus Breeding, MD, 1 tablet at 10/21/11 2132;  sodium chloride 0.9 % injection 10-40 mL, 10-40 mL, Intracatheter, Q12H, Alyson Reedy, MD, 20 mL at 10/18/11 1026;  sodium chloride 0.9 % injection 10-40 mL, 10-40 mL, Intracatheter, PRN, Alyson Reedy, MD, 10 mL at  10/22/11 0509 DISCONTD: feeding supplement (JEVITY 1.2 CAL) liquid 1,000 mL, 1,000 mL, Per Tube, Q24H, Layne Benton, NP, 1,000 mL at 10/21/11 1400;  DISCONTD: feeding supplement (JEVITY 1.2 CAL) liquid 1,000 mL, 1,000 mL, Per Tube, Q24H, Merwyn Katos, MD;  DISCONTD: feeding supplement (PRO-STAT SUGAR FREE 64) liquid 30 mL, 30 mL, Per Tube, TID, Heather Cornelison Pitts, RD, 30 mL at 10/22/11 1013 DISCONTD: free water 230 mL, 230 mL, Per Tube, QID, Layne Benton, NP, 230 mL at 10/22/11 1000  Patients Current Diet: General  Precautions / Restrictions Precautions Precautions: Fall Precaution Comments: trach collar Restrictions Weight Bearing Restrictions: No   Prior Activity Level Community (5-7x/wk): 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, newspaper / Equipment Home Assistive Devices/Equipment: None Home Adaptive Equipment: None  Prior Functional Level Prior Function Level of Independence: Independent Able to Take Stairs?: Yes Driving: Yes 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  Current Functional Level Cognition  Arousal/Alertness: Awake/alert Overall Cognitive Status: Impaired Overall Cognitive Status: Impaired Current Attention Level: Sustained Memory: Decreased recall of precautions Orientation Level: Oriented to person;Oriented to place;Oriented to time Following Commands: Follows one step commands consistently;Follows multi-step commands inconsistently Safety/Judgement: Decreased awareness of safety precautions;Decreased safety judgement for tasks assessed;Impulsive Awareness of Errors: Assistance required to identify errors made;Assistance required to correct errors made Cognition - Other Comments: Demonstrated increased ability to follow commands during bed mobility. Pt very verbal this session.  Pt able to write call mom, [name] and I want downstairs.  Attention: Sustained Focused Attention: Appears intact Focused Attention Impairment:  Verbal basic;Functional basic Sustained Attention: Impaired Sustained Attention Impairment: Verbal complex;Functional complex Memory: Impaired Memory Impairment: Decreased recall of new information Decreased Short Term Memory: Verbal basic;Functional basic Awareness: Impaired Awareness Impairment: Emergent impairment;Anticipatory impairment;Intellectual impairment Problem Solving: Impaired Problem Solving Impairment: Functional basic;Verbal complex Executive Function: Reasoning;Self Monitoring;Self Correcting Reasoning: Impaired Reasoning Impairment: Verbal basic;Functional basic Sequencing: Appears intact Sequencing Impairment: Verbal basic;Functional basic Self Monitoring: Impaired Self Monitoring Impairment: Functional basic Self Correcting: Impaired Self Correcting Impairment: Functional basic Behaviors: Restless;Impulsive;Perseveration Safety/Judgment: Impaired    Extremity Assessment (includes Sensation/Coordination)  RUE ROM/Strength/Tone: Within functional levels RUE Sensation: WFL - Light Touch RUE Coordination: WFL - gross/fine motor  RLE ROM/Strength/Tone: Within functional levels RLE Sensation: WFL - Light Touch    ADLs  Eating/Feeding: NPO Grooming: Performed;Wash/dry face;Maximal assistance (terminating prematurely) Where Assessed - Grooming: Supported sitting Transfers/Ambulation Related to ADLs: Pt up in chair upon OT arrival. RN used maxi move as recommended by OT/PT. ADL Comments: Pt up in chair upon OT arrival.  Focus of session on static and dynamic sitting balance tasks sitting edge of chair with mod-max verbal and tactile cues to facilitate anterior weight shift. Pt performed anterior weight shift exercise x10. When reaching for therapist at various distances and heights, pt required  min-mod assist to maintain balance.  See sitting balance for further comments.    Mobility  Bed Mobility: Not assessed (pt upright in chair) Rolling Right: 1: +2 Total  assist Rolling Right: Patient Percentage: 20% Rolling Left: 1: +2 Total assist;With rail Rolling Left: Patient Percentage: 40% Supine to Sit: 1: +2 Total assist;HOB elevated Supine to Sit: Patient Percentage: 20% Sitting - Scoot to Edge of Bed: 2: Max assist Sitting - Scoot to [place] of Bed: Patient Percentage: 0% Sit to Supine: 1: +2 Total assist Sit to Supine: Patient Percentage: 0%    Transfers  Transfers: Sit to Stand;Stand to [employer] via Lift Equipment: Maximove    Ambulation / Gait / Stairs / Psychologist, prison and probation services  Ambulation/Gait Ambulation/Gait Assistance: Not tested (comment) Stairs: No Corporate treasurer: No    Posture / Games developer Sitting - Balance Support: Right upper extremity supported;Feet supported Static Sitting - Level of Assistance: 5: Stand by assistance Static Sitting - Comment/# of Minutes: Pt sat edge of chair multiple times during the session without back support, cues for use of RUE to support himself on the arm chair and for midline/forward posture, cues for attention back to task, easily distracted Dynamic Sitting Balance Dynamic Sitting - Balance Support: No upper extremity supported;Feet supported Dynamic Sitting - Level of Assistance: 4: Min assist;3: Mod assist Reach (Patient is able to reach ___ inches to right, left, forward, back): pt reaching with RUE forward and minimally to the right needing only minA for stability however when pt asked to cross midline with reaching pt having a difficult time following with his vision and needing more modA for support Dynamic Sitting - Balance Activities: Lateral lean/weight shifting;Forward lean/weight shifting Dynamic Sitting - Comments: pt performed hip hinges from reclined position to upright sitting x10 throughout session progressing from +2totalpt50% ->minA with practice; pt initially needing bilateral facilitation as well as hand over hand for  sequencing/problem solving and engagement of abdominals for hip/trunk forward, with practice pt able to reach for armrest with minimal verbal cueing and min tactile facilitation to bring trunk forward and to midline to sit edge of chair with good upright posture     Previous Home Environment Living Arrangements: Spouse/significant other Lives With: Spouse Available Help at Discharge: Family;Available 24 hours/day Type of Home: House Home Layout: One level Home Access: Stairs to enter Entrance Stairs-Rails: None Entrance Stairs-Number of Steps: 2 Bathroom Shower/Tub: Counselling psychologist: Yes How Accessible: Accessible via walker Home Care Services: No Additional Comments: enjoys watching tv (wrestling) , like to participate with church, mows grass at home, likes to play on computer  Discharge Living Setting Plans for Discharge Living Setting: Patient's home;Lives with (comment) (spouse) Type of Home at Discharge: House Discharge Home Layout: One level Discharge Home Access: Stairs to enter Entrance Stairs-Rails: None Entrance Stairs-Number of Steps: 2 steps Discharge Bathroom Shower/Tub: Tub/shower unit Discharge Bathroom Toilet: Standard Discharge Bathroom Accessibility: Yes How Accessible: Accessible via walker Do you have any problems obtaining your medications?: No  Social/Family/Support Systems Patient Roles: Spouse;Parent;Other (Comment) (employee) Contact Information: Tsuneo Faison, wife Anticipated Caregiver: wife Anticipated Caregiver's Contact Information: cell [phone] Ability/Limitations of Caregiver: min assist. transportation public bus system Caregiver Availability: 24/7 Discharge Plan Discussed with Primary Caregiver: Yes Is Caregiver In Agreement with Plan?: Yes Does Caregiver/Family have Issues with Lodging/Transportation while Pt is in Rehab?: No  Goals/Additional Needs    Patient Condition: Please  see physician update to information in consult dated 10/13/11.  Preadmission Screen Completed By:  Clois Dupes, 10/22/2011 12:36 PM ______________________________________________________________________   Discussed status with Dr. Riley Kill on 10/22/11 at  1246 and received telephone approval for admission today.  Admission Coordinator:  Clois Dupes, time 9604 Date 10/22/11.

## 2011-10-22 NOTE — Progress Notes (Signed)
Passy-Muir Speaking Valve - Treatment and Cognitive treatment Patient Details  Name: Jeremy [name] MRN: [id number] Date of Birth: 1965/04/25  Today's Date: 10/22/2011 Time: 7253-6644 SLP Time Calculation (min): 60 min  Past Medical History:  Past Medical History  Diagnosis Date  . Hypertension   . Stroke    Past Surgical History:  Past Surgical History  Procedure Date  . Tracheostomy tube placement 10/11/2011    Procedure: TRACHEOSTOMY;  Surgeon: Christia Reading, MD;  Location: [facility] OR;  Service: ENT;  Laterality: N/A;    Assessment / Plan / Recommendation Clinical Impression  After change to a size 6 cuffless trach pt now able to tolerate placement of PMSV for over one hour without any change in vital signs or CO2 trapping. Pt required max verbal and visual cues to increase volume at phrase level for intelligibility. Voice continues to be hoarse. After FEES today suspect dysphonia may be partly related to errythema of vocal folds and white spot on the right subglottic tissue just below and possibly touching right vocal fold inferiorally. Given cognitive deficits and poor awareness of safety precautions feel pt should only wear PMSV with full supervision from staff or trained family. May transition to intermittent supervsion when further tolerance is established.   Cognitive therapy also provided. SLP provided question cues for verbalization of intellectual awareness of deficits. Pt required max verbal and contextual cues. Pt demonstrated emergent awareness of left neglect in basic problem solving task with max visual and contextual cues. Pt required max contextual cues for basic functional problem solving (ordering from menu). Pt making progress, eager to work with therapy. Would be an excellent inpatient rehab candidate.     Plan  Continue with current plan of care    Follow Up Recommendations  Inpatient Rehab    Pertinent Vitals/Pain NA    SLP Goals Potential to Achieve Goals:  Good Progress/Goals/Alternative treatment plan discussed with pt/caregiver and they: Agree SLP Goal #1: Pt will complete basic problem solving during functional tasks x2  with moderate contextual cues. SLP Goal #1 - Progress: Progressing toward goal SLP Goal #2: Pt will demonstrate emergent awareness of deficits during basic functional tasks with moderate contextual cues x3.  SLP Goal #2 - Progress: Progressing toward goal SLP Goal #3: Pt will follow multistep commands with min verbal cues during functional tasks x3.  SLP Goal #3 - Progress: Progressing toward goal SLP Goal #4: Pt will toelrate placement of PMSV for 30 minutes without change in vital signs of Co2 trapping with max assist from SLP SLP Goal #4 - Progress: Progressing toward goal SLP Goal #5: Pt will vebalize at phrase level with 80% intelligibility with moderate verbal cues.  SLP Goal #5 - Progress: Progressing toward goal   PMSV Trial  PMSV was placed for:  one hour Able to redirect subglottic air through upper airway: Yes Able to Attain Phonation: Yes Voice Quality: Hoarse;Low vocal intensity Able to Expectorate Secretions: Yes Level of Secretion Expectoration with PMSV: Tracheal Breath Support for Phonation: Moderately decreased Intelligibility: Intelligibility reduced Word: 75-100% accurate Phrase: 50-74% accurate Sentence: 25-49% accurate Conversation: 25-49% accurate Respirations During Trial: 15  SpO2 During Trial: 100 % Pulse During Trial: 86  Behavior: Alert   Tracheostomy Tube  Additional Tracheostomy Tube Assessment Trach Collar Period: 24 hours a day Secretion Description: thin, yellow Level of Secretion Expectoration: Tracheal    Vent Dependency  Vent Dependent: No FiO2 (%): 28 % Nocturnal Vent: No    Cuff Deflation Trial  GO  Harlon Ditty, MA CCC-SLP [phone]      Claudine Mouton 10/22/2011, 12:11 PM

## 2011-10-22 NOTE — Discharge Summary (Signed)
Physician Discharge Summary  Patient ID: Jeremy [name] MRN: [id number] DOB/AGE: 1965/05/13 46 y.o.  Admit date: 10/01/2011 Discharge date: 10/22/2011    Discharge Diagnoses:  Active Problems:  ICH (intracerebral hemorrhage)  HTN (hypertension)  Cytotoxic cerebral edema  Obstructive hydrocephalus  Fever  Tracheostomy dependent  Dysphagia, PEG dependent    Brief Summary: Jeremy [name] is a 46 y.o. y/o male with a PMH of untreated HTN admitted 10/01/2011 with unsteady gait, and left hand weakness associated with headache and vomiting. He was found to have Rt basal ganglia ICH with surrounding edema.  He was treated with nicardipine gtt.  Required intubation due to somnolence.  IVC drain placed by Dr. Jordan Likes but malfunctioned requiring replacement--patient self removed on 8/29.  He had difficulty weaning from vent and trach was placed on 08/26 and gastrostomy tube placed by IVR on 08/28.  BP has been labile. He did develop fever 08/30 and was started on antibiotics for treatment. This resolved when CVL discontinued and patient has been off antibiotics. Did have episode of projectile vomiting on 09/04 and KUB done without evidence of obstruction. TF rate adjusted with resolution of symptoms. Trach downsized to CFS #6 9/5.  FEES completed 9/6 with recommendations for regular diet and thin liquids.       Consults: Neurology / Stroke  Microbiology/Sepsis markers: 8/16 MRSA PCR>>>neg 8/16 UC>>>neg 8/23 CSF GS>>>neg 8/23 Resp Culture>>>staph aureus 8/23 CSF>>>neg 8/23 BCx2>>>neg 8/31 C-diff>>>neg 8/31 UC>>>neg 8/30 BCx2>>>neg   Events Since Admit / Significant Diagnostic Studies:  8/16 Admit with basal ganglia hemorrhage  8/20 Intubated for airway protection and elevated ICP with IVC drain placement  8/21 IVC drain replaced d/t prior drain malfunction  8/27 Trach (Dr Jenne Pane)  8/28 PEG (IR)  8/29 Pt pulled ventric drain  8/30 Abx started for Tm 103.1  9/2 Transferred to med-surg    9/3 CVL removed. PICC placed  9/4 Projective vomiting overnight. TF off.  9/5 Trach changed to 6 uncuffed Shiley  9/5 TFs resumed @ reduced rate  9/5 PMV trials ordered. FEES per SLP 9/6 Passed swallow eval --regular diet, thin liquids                                                                     Hospital Summary by Discharge Diagnosis  Acute ICH Assessment:  Admitted 8/16 to [facility] with c/o of headaches for few days, with associated nausea and vomiting, On 10/01/11 morning according to his wife he had visual disturbance and then he vomited followed by left sided weakness approximately 7 AM. Patient arrived to the ED with head CT demonstrating Right basal ganglia bleed with some area involved in thalamus and minimal mass effect.  According to the wife he has stopped taking his BP medications several years ago and does not go the physician as suggested. Previously, he had significant neurological symptoms with significant elevated BP that could have been attributed to hypertensive encephalopathy.  He underwent right ventriculostomy, later replacement of tube due to blockage, was taken out by patient 08/29, he remain clinically stable.  Discharge Plan: -continue PT / OT efforts -continue baclofen for clonus -BP control  -PT/OT  Acute respiratory failure s/p trach in the setting of ICH.  Assessment:  Admit with acute basal ganglia hemorrhage.  Required intubation for airway protection.  Elevated ICP s/p IVC drain placement.  Ultimately required tracheostomy placement in setting of prolonged respiratory failure.  Fevers noted with ? LLL infiltrate.  Fever resolved with removal of central line.    Discharge Plan:  -ENT removed sutures 9/5, ok to change trach PRN per ENT  -D/C planning - he is ready to go to CIR when he meets the necessary criteria to participate in what they have to offer  -fever resolved with removal of old CVL, follow off abx  -consider decannulation as strength  improves & mental status // PCCM will be available PRN for decannulation needs  Dysphagia / Nausea / Vomiting  Assessment:  New nausea and vomiting noted on 9/3 --tube feedings held and resumed 9/5 at reduced rate. KUB within normal limits 9/5.    Discharge Plan:  -ST to eval for PMV use  -PRN zofran  -cleared 9/6 for regular diet, thin liquids  Hypertension Assessment:  Known history with medical non-compliance.    Discharge Plan: -continue metoprolol -continue heart healthy diet  Discharge Exam: Gen: No distress  HEENT: Trach site midline, mucopurulent secretions  PULM: clear anteriorly  CV: s1s2 regular, no murmur  AB: soft, non tender, +bowel sounds, PEG site clean  Ext: no edema  Derm: no rashes  Neuro: L facial droop, L hemiplegia   Filed Vitals:   10/22/11 0820 10/22/11 0952 10/22/11 1230 10/22/11 1417  BP:  151/84  135/78  Pulse:  92  80  Temp:  98.2 F (36.8 C)  98.8 F (37.1 C)  TempSrc:  Oral  Oral  Resp:  18  18  Height:      Weight:      SpO2: 95% 100% 99% 100%     Discharge Labs  BMET  Lab 10/22/11 0500 10/19/11 0815 10/18/11 0245 10/17/11 0503 10/16/11 0610  NA 137 136 137 139 142  K 3.8 4.3 -- -- --  CL 101 101 101 101 106  CO2 27 27 28 30 30   GLUCOSE 105* 135* 123* 117* 121*  BUN 20 18 18 22  27*  CREATININE 0.71 0.70 0.66 0.87 0.79  CALCIUM 9.9 9.8 9.2 9.6 9.6  MG -- -- -- -- --  PHOS -- -- -- -- --     CBC   Lab 10/22/11 0500 10/19/11 0815 10/18/11 0245  HGB 9.9* 9.6* 9.2*  HCT 30.3* 29.4* 28.7*  WBC 8.2 12.0* 13.5*  PLT 363 368 351   Anti-Coagulation No results found for this basename: INR:5 in the last 168 hours    Discharge Orders    Future Appointments: Provider: Department: Dept Phone: Center:   10/23/2011 8:00 AM Sunrise Desanctis, PT [facility] [phone] None   10/23/2011 9:00 AM Huston Foley, CCC-SLP [facility] [phone] None   10/23/2011 11:00 AM Hurshel Party [facility] [phone] None     10/23/2011 2:30 PM Sherman Desanctis, PT [facility] [phone] None   10/24/2011 11:15 AM Hurshel Party [facility] [phone] None   10/25/2011 8:30 AM Charisse March Essenmacher, OT [facility] [phone] None   10/25/2011 9:30 AM [name], PT [facility] [phone] None   10/25/2011 1:30 PM Huston Foley, CCC-SLP [facility] [phone] None   10/25/2011 3:00 PM [name], PT [facility] [phone] None       Follow-up Information    Follow up with Gates Rigg, MD. Schedule an appointment as soon as  possible for a visit in 2 months. (stroke clinic)    Contact information:   [address] [facility] [place] [address] [phone]           Medication List    Notice       You have not been prescribed any medications.               Disposition: Final discharge disposition not confirmed  Discharged Condition: Jeremy [name] has met maximum benefit of inpatient care and is medically stable and cleared for discharge.  Patient is pending follow up as above.      Time spent on disposition:  Greater than 35 minutes.   Signed: Canary Brim, NP-C [facility] Pgr: [phone]

## 2011-10-22 NOTE — Progress Notes (Signed)
Patient admit to rehab at 1610 from [facility] [place].  Admission packet provided and explained.  Discussed the important of fall prevention and encourage patient to call for assistance.  Side rails up, call bell within reach.

## 2011-10-22 NOTE — H&P (Signed)
Physical Medicine and Rehabilitation Admission H&P  Chief Complaint   Patient presents with   .  LUE weakness, dysphagia, cognitive deficits due to ICH   :  HPI: Jeremy [name] is a 46 y.o. male with history of HTN-no medications x years who was admitted on 10/01/11 with headache, N/V, and difficulty using left hand. CT head revealed R-BG hemorrhage with surrounding edema. Started on nicardipine drip for BP management. Patient developed somnolence due to extension of hemorrhage to right thalamus and posterior third ventricle with concerns for developing obstructive hydrocephalus. Patient intubated. Dr. Jordan Likes consulted and IVC placed. Did require replacement on 08/21 due to malfunction. He had difficulty weaning of vent required trach on 08/26 and gastrostomy tube placed by IVR on 08/28. Dr. Jordan Likes consulted and ventriculostomy drain placed and last removed by patient on 08/29. BP has been labile. He did develop fever 08/30 and was started on antibiotics for treatment. This resolved when CVL discontinued and patient has been off antibiotics. Did have episode of projectile vomiting on 09/04 and KUB done without evidence of obstruction. TF rate adjusted with resolution of symptoms. Trach downsized to CFS #6 yesterday. FEES done today and regular diet initiated. Patient continues with dense left hemiparesis with sensory deficits, left neglect,aphonia with cognitive deficits and visual deficts in upper fields. PT/OT/ST has been ongoing and CIR recommended for follow up therapies.  Review of Systems  HENT: Negative for hearing loss.  Eyes: Positive for double vision (left eye) and pain (left eye).  Respiratory: Positive for cough. Negative for shortness of breath.  Cardiovascular: Negative for chest pain and palpitations.  Gastrointestinal: Negative for nausea and abdominal pain.  Musculoskeletal: Positive for myalgias (muscle spasms LLE) and joint pain (left knee pain).  Neurological: Negative for dizziness and  headaches.   Past Medical History   Diagnosis  Date   .  Hypertension    .  Stroke     Past Surgical History   Procedure  Date   .  Tracheostomy tube placement  10/11/2011     Procedure: TRACHEOSTOMY; Surgeon: Christia Reading, MD; Location: [facility] OR; Service: ENT; Laterality: N/A;    History reviewed. No pertinent family history.  Social History: Married. 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 Wife does not work. Has stepdaughter 93 years old? He reports that he has never smoked. He does not have any smokeless tobacco history on file. He reports that he does not drink alcohol or use illicit drugs.  Allergies: No Known Allergies  Scheduled Meds:  .  antiseptic oral rinse  15 mL  Mouth Rinse  Q2H   .  chlorhexidine  15 mL  Mouth Rinse  BID   .  enoxaparin (LOVENOX) injection  60 mg  Subcutaneous  Q24H   .  metoprolol tartrate  25 mg  Per Tube  Q12H   .  senna-docusate  1 tablet  Oral  BID   .  sodium chloride  10-40 mL  Intracatheter  Q12H   .  DISCONTD: feeding supplement (JEVITY 1.2 CAL)  1,000 mL  Per Tube  Q24H   .  DISCONTD: feeding supplement (JEVITY 1.2 CAL)  1,000 mL  Per Tube  Q24H   .  DISCONTD: feeding supplement  30 mL  Per Tube  TID   .  DISCONTD: free water  230 mL  Per Tube  QID    No prescriptions prior to admission    Home:  Home Living  Lives With: Spouse  Available Help at  Discharge: Family;Available 24 hours/day  Type of Home: House  Home Access: Stairs to enter  [employer] of Steps: 2  Entrance Stairs-Rails: None  Home Layout: One level  Bathroom Shower/Tub: Sports administrator: Standard  Bathroom Accessibility: Yes  How Accessible: Accessible via walker  Home Adaptive Equipment: None  Additional Comments: enjoys watching tv (wrestling) , like to participate with church, mows grass at home, likes to play on computer  Functional History:  Prior Function  Able to Take Stairs?: Yes  Driving: Yes  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  Functional Status:  Mobility:  Bed Mobility  Bed Mobility: Not assessed (pt upright in chair)  Rolling Right: 1: +2 Total assist  Rolling Right: Patient Percentage: 20%  Rolling Left: 1: +2 Total assist;With rail  Rolling Left: Patient Percentage: 40%  Supine to Sit: 1: +2 Total assist;HOB elevated  Supine to Sit: Patient Percentage: 20%  Sitting - Scoot to Edge of Bed: 2: Max assist  Sitting - Scoot to [employer] of Bed: Patient Percentage: 0%  Sit to Supine: 1: +2 Total assist  Sit to Supine: Patient Percentage: 0%  Transfers  Transfers: Sit to Stand;Stand to [employer] via Lift Equipment: Maximove  Ambulation/Gait  Ambulation/Gait Assistance: Not tested (comment)  Stairs: No  Wheelchair Mobility  Wheelchair Mobility: No  ADL:  ADL  Eating/Feeding: NPO  Grooming: Performed;Wash/dry face;Maximal assistance (terminating prematurely)  Where Assessed - Grooming: Supported sitting  Transfers/Ambulation Related to ADLs: Pt up in chair upon OT arrival. RN used maxi move as recommended by OT/PT.  ADL Comments: Pt up in chair upon OT arrival. Focus of session on static and dynamic sitting balance tasks sitting edge of chair with mod-max verbal and tactile cues to facilitate anterior weight shift. Pt performed anterior weight shift exercise x10. When reaching for therapist at various distances and heights, pt required min-mod assist to maintain balance. See sitting balance for further comments.  Cognition:  Cognition  Overall Cognitive Status: Impaired  Arousal/Alertness: Awake/alert  Orientation Level: Oriented to person;Oriented to place  Attention: Sustained  Focused Attention: Appears intact  Focused Attention Impairment: Verbal basic;Functional basic  Sustained Attention: Impaired  Sustained Attention Impairment: Verbal complex;Functional complex  Memory: Impaired  Memory Impairment: Decreased recall of new information  Decreased Short  Term Memory: Verbal basic;Functional basic  Awareness: Impaired  Awareness Impairment: Emergent impairment;Anticipatory impairment;Intellectual impairment  Problem Solving: Impaired  Problem Solving Impairment: Functional basic;Verbal complex  Executive Function: Reasoning;Self Monitoring;Self Correcting  Reasoning: Impaired  Reasoning Impairment: Verbal basic;Functional basic  Sequencing: Appears intact  Sequencing Impairment: Verbal basic;Functional basic  Self Monitoring: Impaired  Self Monitoring Impairment: Functional basic  Self Correcting: Impaired  Self Correcting Impairment: Functional basic  Behaviors: Restless;Impulsive;Perseveration  Safety/Judgment: Impaired  Cognition  Overall Cognitive Status: Impaired  Area of Impairment: Attention;Following commands;Safety/judgement;Awareness of errors;Awareness of deficits;Problem solving  Arousal/Alertness: Awake/alert  Orientation Level: Appears intact for tasks assessed  Behavior During Session: [facility] for tasks performed  Current Attention Level: Sustained  Memory: Decreased recall of precautions  Following Commands: Follows one step commands consistently;Follows multi-step commands inconsistently  Safety/Judgement: Decreased awareness of safety precautions;Decreased safety judgement for tasks assessed;Impulsive  Awareness of Errors: Assistance required to identify errors made;Assistance required to correct errors made  Problem Solving: Pt reporting that he is currently at the church  Cognition - Other Comments: Demonstrated increased ability to follow commands during bed mobility. Pt very verbal this session. Pt able to write call mom, Rosie and I  want downstairs.  Blood pressure 135/78, pulse 80, temperature 98.8 F (37.1 C), temperature source Oral, resp. rate 18, height 5\' 10"  (1.778 m), weight 121.1 kg (266 lb 15.6 oz), SpO2 100.00%.   Physical Exam  Nursing note and vitals reviewed.  Constitutional: He is oriented to person,  place, and time. He appears well-developed and well-nourished.  Morbidly obese male with staples right scalp and tracheostomy. #6 trach HENT:  Head: Normocephalic and atraumatic.  Tongue with greenish white coating. Poor dentition. Missing multiple teeth  Eyes: Pupils are equal, round, and reactive to light.  Unable to close left eye. Dysconjugate gaze, exopthalmus Neck:  Trach in place with crusted and yellowish secretions.  Cardiovascular: Normal rate and regular rhythm.  Pulmonary/Chest: Effort normal.  Bilateral rhonchi due to upper airway sounds.  Abdominal: Soft. Bowel sounds are normal. He exhibits no distension. There is no tenderness.  PEG site clean and dry.  Musculoskeletal: He exhibits tenderness (left knee with ROM.).  Neurological: He is alert and oriented to person, place, and time. He displays abnormal reflex.  Able to state DOB, current month but not the date. Polite and appropriate. Follows basic commands without difficulty. Aphonia noted, severe dysarthria. Left facial weakness. Unable to look upward. Dense left hemiparesis with flexor tone at knee. 4 beats clonus left foot. Senses pain in left leg and arm. Skin: Skin is warm and dry.   Results for orders placed during the hospital encounter of 10/01/11 (from the past 48 hour(s))   GLUCOSE, CAPILLARY Status: Abnormal    Collection Time    10/21/11 9:37 PM   Component  Value  Range  Comment    Glucose-Capillary  104 (*)  70 - 99 mg/dL     Comment 1  Notify RN      Comment 2  Documented in Chart     GLUCOSE, CAPILLARY Status: Normal    Collection Time    10/22/11 12:41 AM   Component  Value  Range  Comment    Glucose-Capillary  90  70 - 99 mg/dL     Comment 1  Notify RN      Comment 2  Documented in Chart     BASIC METABOLIC PANEL Status: Abnormal    Collection Time    10/22/11 5:00 AM   Component  Value  Range  Comment    Sodium  137  135 - 145 mEq/L     Potassium  3.8  3.5 - 5.1 mEq/L     Chloride  101  96 - 112  mEq/L     CO2  27  19 - 32 mEq/L     Glucose, Bld  105 (*)  70 - 99 mg/dL     BUN  20  6 - 23 mg/dL     Creatinine, Ser  1.61  0.50 - 1.35 mg/dL     Calcium  9.9  8.4 - 10.5 mg/dL     GFR calc non Af Amer  >90  >90 mL/min     GFR calc Af Amer  >90  >90 mL/min    CBC Status: Abnormal    Collection Time    10/22/11 5:00 AM   Component  Value  Range  Comment    WBC  8.2  4.0 - 10.5 K/uL     RBC  3.42 (*)  4.22 - 5.81 MIL/uL     Hemoglobin  9.9 (*)  13.0 - 17.0 g/dL     HCT  09.6 (*)  04.5 -  52.0 %     MCV  88.6  78.0 - 100.0 fL     MCH  28.9  26.0 - 34.0 pg     MCHC  32.7  30.0 - 36.0 g/dL     RDW  16.1  09.6 - 04.5 %     Platelets  363  150 - 400 K/uL    Post Admission Physician Evaluation:  1. Functional deficits secondary to Right BG to thalamus hemorrhage with dense left hemiparesis. 2. Patient is admitted to receive collaborative, interdisciplinary care between the physiatrist, rehab nursing staff, and therapy team. 3. Patient's level of medical complexity and substantial therapy needs in context of that medical necessity cannot be provided at a lesser intensity of care such as a SNF. 4. Patient has experienced substantial functional loss from his/her baseline which was documented above under the "Functional History" and "Functional Status" headings. Judging by the patient's diagnosis, physical exam, and functional history, the patient has potential for functional progress which will result in measurable gains while on inpatient rehab. These gains will be of substantial and practical use upon discharge in facilitating mobility and self-care at the household level. 5. Physiatrist will provide 24 hour management of medical needs as well as oversight of the therapy plan/treatment and provide guidance as appropriate regarding the interaction of the two. 6. 24 hour rehab nursing will assist with bladder management, bowel management, safety, skin/wound care, disease management, medication  administration, pain management and patient education and help integrate therapy concepts, techniques,education, etc. 7. PT will assess and treat for: fxnl mobility, NMR, pain mgt, strength, family ed, adaptive equipment. Goals are: min to mod assist 8. OT will assess and treat for: UES, ROM, ADL's, fxnl mobility, adaptive equipment, family ed. Goals are: min to mod assist. 9. SLP will assess and treat for: speech, language, communication, swallowing. Goals are: supervision to mod assist. 10. Case Management and Social Worker will assess and treat for psychological issues and discharge planning. 11. Team conference will be held weekly to assess progress toward goals and to determine barriers to discharge. 12. Patient will receive at least 3 hours of therapy per day at least 5 days per week. 13. ELOS: 3-4 weeks Prognosis: good Medical Problem List and Plan:  1. DVT Prophylaxis/Anticoagulation: Pharmaceutical: Lovenox  2. Pain Management: will add prn baclofen for spasticity and voltaren gel for left knee pain.  3. Mood: No signs of distress noted. Able to state that he had a stroke but has poor awareness of deficits.  4. Neuropsych: This patient is is not capable of making decisions on his/her own behalf.  5. HTN: Monitor with bid checks. continue metoprolol bid. SBP goal less than 170.  6. ABLA: will recheck on 09/09. Will add iron supplement if tolerating new diet without recurrent N/V  zach Riley Kill, MD

## 2011-10-23 ENCOUNTER — Inpatient Hospital Stay (HOSPITAL_COMMUNITY): Payer: BC Managed Care – PPO | Admitting: Physical Therapy

## 2011-10-23 ENCOUNTER — Inpatient Hospital Stay (HOSPITAL_COMMUNITY): Payer: BC Managed Care – PPO | Admitting: Speech Pathology

## 2011-10-23 ENCOUNTER — Inpatient Hospital Stay (HOSPITAL_COMMUNITY): Payer: BC Managed Care – PPO | Admitting: *Deleted

## 2011-10-23 DIAGNOSIS — I10 Essential (primary) hypertension: Secondary | ICD-10-CM

## 2011-10-23 DIAGNOSIS — I619 Nontraumatic intracerebral hemorrhage, unspecified: Secondary | ICD-10-CM

## 2011-10-23 DIAGNOSIS — J9600 Acute respiratory failure, unspecified whether with hypoxia or hypercapnia: Secondary | ICD-10-CM

## 2011-10-23 DIAGNOSIS — Z5189 Encounter for other specified aftercare: Secondary | ICD-10-CM

## 2011-10-23 MED ORDER — BACLOFEN 5 MG HALF TABLET
5.0000 mg | ORAL_TABLET | Freq: Three times a day (TID) | ORAL | Status: DC
  Administered 2011-10-23 – 2011-10-24 (×6): 5 mg
  Filled 2011-10-23 (×10): qty 1

## 2011-10-23 NOTE — Progress Notes (Signed)
Subjective/Complaints: Slept well. No problems overnight. sats in 90's A 12 point review of systems has been performed and if not noted above is otherwise negative.   Objective: Vital Signs: Blood pressure 140/88, pulse 87, temperature 98.3 F (36.8 C), temperature source Oral, resp. rate 20, SpO2 97.00%. Dg Abd Portable 1v  10/21/2011  *RADIOLOGY REPORT*  Clinical Data: Rule out ileus.  Abdominal pain center [place].  PORTABLE ABDOMEN - 1 VIEW  Comparison: None.  Findings: Ostomy tube balloon is seen overlying the mid aspect of the stomach. A nonobstructive bowel gas pattern is seen with gas throughout the colon to the level of the rectum.  Some residual contrast is present within the rectum.  No small bowel gas is identified.  No free intraperitoneal air is suggested.  There is a 9 mm calcific density identified in the medial right upper quadrant which demonstrates predominately peripheral calcification.  Given the location and peripheral calcification this likely represents a gallstone. A renal calculus would be a secondary consideration.  No prior study includes this area to help differentiate between these possibilities.  IMPRESSION: No evidence for ileus or small bowel obstruction radiographically.  Right upper quadrant calcification likely a gallstone with renal calculus a secondary possibility   Original Report Authenticated By: Bertha Stakes, M.D.     Basename 10/22/11 0500  WBC 8.2  HGB 9.9*  HCT 30.3*  PLT 363    Basename 10/22/11 0500  NA 137  K 3.8  CL 101  CO2 27  GLUCOSE 105*  BUN 20  CREATININE 0.71  CALCIUM 9.9   CBG (last 3)   Basename 10/22/11 0041 10/21/11 2137  GLUCAP 90 104*    Wt Readings from Last 3 Encounters:  10/18/11 121.1 kg (266 lb 15.6 oz)  10/18/11 121.1 kg (266 lb 15.6 oz)    Physical Exam:  Nursing note and vitals reviewed.  Constitutional: He is oriented to person, place, and time. He appears well-developed and well-nourished.    Morbidly obese male with staples right scalp and tracheostomy. #6 trach  HENT:  Head: Normocephalic and atraumatic.  Tongue with greenish white coating. Poor dentition. Missing multiple teeth  Eyes: Pupils are equal, round, and reactive to light.  Unable to close left eye. Dysconjugate gaze, exopthalmus. Cannot gaze upward Neck:  Trach in place with crusted and yellowish secretions. No distress Cardiovascular: Normal rate and regular rhythm.  Pulmonary/Chest: Effort normal.  Bilateral rhonchi due to upper airway sounds.  Abdominal: Soft. Bowel sounds are normal. He exhibits no distension. There is no tenderness.  PEG site clean and dry.  Musculoskeletal: He exhibits tenderness (left knee with ROM.).  Neurological: He is alert and oriented to person, place, and time. He displays abnormal reflex.  Able to state DOB, current month but not the date. Polite and appropriate. Follows basic commands without difficulty. Aphonia noted, severe dysarthria. Left facial weakness. Unable to look upward. Dense left hemiparesis with flexor tone at knee. sustained clonus left foot. Senses pain in left leg and arm. LUE 0 to trace. LLE 0-1 on MMT Skin: Skin is warm and dry.    Assessment/Plan: 1. 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 Physiatrist is providing close team supervision and 24 hour management of active medical problems listed below. Physiatrist and rehab team continue to assess barriers to discharge/monitor patient progress toward functional and medical goals. FIM:  Comprehension Comprehension Mode: Auditory Comprehension: 5-Follows basic conversation/direction: With extra time/assistive device  Expression Expression Mode: Verbal (with passy muir) Expression Assistive Devices: 6-Talk trach  valve Expression: 5-Expresses basic needs/ideas: With no assist  Social Interaction Social Interaction: 5-Interacts appropriately 90% of the time - Needs monitoring or encouragement for participation or interaction.  Problem Solving Problem Solving: 5-Solves basic problems: With no assist  Memory Memory: 4-Recognizes or recalls 75 - 89% of the time/requires cueing 10 - 24% of the time  Medical Problem List and Plan:  1. DVT Prophylaxis/Anticoagulation: Pharmaceutical: Lovenox  2. Pain Management: will add prn baclofen for spasticity and voltaren gel for left knee pain.  3. Mood: No signs of distress noted. Able to state that he had a stroke but has poor awareness of deficits.  4. Neuropsych: This patient is is not capable of making decisions on his/her own behalf.  5. HTN: Monitor with bid checks. continue metoprolol bid. SBP goal less than 170.  6. ABLA: will recheck on 09/09. Will add iron supplement if tolerating new diet without recurrent N/V 7. Tone: begin baclofen trial. PRAFO for LLW 8. Pulmonary: #6 trach. Secretions still notable. sats good.  -downsize Monday? LOS (Days) 1 A FACE TO FACE EVALUATION WAS PERFORMED  SWARTZ,ZACHARY T 10/23/2011, 5:08 AM

## 2011-10-23 NOTE — Evaluation (Signed)
Speech Language Pathology Assessment and Plan  Patient Details  Name: Jeremy [name] MRN: [id number] Date of Birth: 1965/05/31  SLP Diagnosis: Voice disorder;Cognitive Impairments  Rehab Potential: Good ELOS: 3-4 weeks   Today's Date: 10/23/2011 Time: 0900-1000 Time Calculation (min): 60 min  Skilled Therapeutic Intervention: Pt administered PMSV evaluation, cognitive-linguistic evaluation and BSE. Please see below for details.   Problem List:  Patient Active Problem List  Diagnosis  . ICH (intracerebral hemorrhage)  . HTN (hypertension)  . Cytotoxic cerebral edema  . Obstructive hydrocephalus  . Fever  . Tracheostomy dependent  . Dysphagia, PEG dependent   Past Medical History:  Past Medical History  Diagnosis Date  . Hypertension 10/01/2011    basal ganglia  . Stroke    Past Surgical History:  Past Surgical History  Procedure Date  . Tracheostomy tube placement 10/11/2011    Procedure: TRACHEOSTOMY;  Surgeon: Christia Reading, MD;  Location[facility];  Service: ENT;  Laterality: N/A;    Assessment / Plan / Recommendation Clinical Impression  Patient is a 46 y.o. male with history of HTN-no medications who was admitted on 10/01/11 with headache, N/V, and difficulty using left hand. CT head revealed R-BG hemorrhage with surrounding edema. Started on nicardipine drip for BP management. Patient developed somnolence due to extension of hemorrhage to right thalamus and posterior third ventricle with concerns for developing obstructive hydrocephalus. Patient intubated. Dr. Jordan Likes consulted and IVC placed. Did require replacement on 08/21 due to malfunction. He had difficulty weaning of vent required trach on 08/26 and gastrostomy tube placed by IVR on 08/28. Dr. Jordan Likes consulted and ventriculostomy drain placed and last removed by patient on 08/29. BP has been labile. He did develop fever 08/30 and was started on antibiotics for treatment. This resolved when CVL discontinued and patient has  been off antibiotics. Did have episode of projectile vomiting on 09/04 and KUB done without evidence of obstruction. TF rate adjusted with resolution of symptoms. Trach downsized to CFS #6 yesterday. FEES done 10/22/11 and regular diet initiated. Patient transferred to CIR on 10/22/2011 and presents with moderate-severe cognitive impairments characterized by decreased initiation, left inattention, decreased sustained attention, decreased functional problem solving, decreased intellectual awareness, decreased working memory, and delayed processing impacting pt's overall safety and independence. Pt also presents with dysphonia which impacts pt's overall speech intelligibility. Pt would benefit from skilled SLP intervention to maximize functional communication and cognitive function to maximize overall functional independence for planned discharge home with 24 hour assist. Anticipate patient will benefit from follow up [facility] at discharge.    SLP Assessment  Patient will need skilled Speech Lanaguage Pathology Services during CIR admission    Recommendations  Follow up Recommendations: Home Health SLP Equipment Recommended: None recommended by SLP    SLP Frequency 1-2 X/day, 30-60 minutes   SLP Treatment/Interventions Cognitive remediation/compensation;Internal/external aids;Speech/Language facilitation;Therapeutic Activities;Functional tasks;Cueing hierarchy;Environmental controls;Patient/family education    Pain Pain Assessment Pain Assessment: No/denies pain Pain Score:   1 Prior Functioning Type of Home: House Lives With: Spouse Available Help at Discharge: Family;Available 24 hours/day Education: 12th grade education Vocation: Full time employment  Short Term Goals: Week 1: SLP Short Term Goal 1 (Week 1): Pt will utilize increased vocal intensity at the phrase level with Min verbal cues.  SLP Short Term Goal 2 (Week 1): Pt will orient to place, time and situation with supervision contextual and  enviornmental cues.  SLP Short Term Goal 3 (Week 1): Pt will sustain attention to functional task for ~10 minutes with Mod  A verbal cues for redirection  SLP Short Term Goal 4 (Week 1): Pt will attend to left field of enviornments/body during functional tasks with Mod A verbal and semantic cues.  SLP Short Term Goal 5 (Week 1): Pt will initiate functional tasks with Min A verbal cues.  SLP Short Term Goal 6 (Week 1): Pt will demonstrate functional problem solving for functional and familair tasks with Mod A verbal cues.   See FIM for current functional status Refer to Care Plan for Long Term Goals  Recommendations for other services: None  Discharge Criteria: Patient will be discharged from SLP if patient refuses treatment 3 consecutive times without medical reason, if treatment goals not met, if there is a change in medical status, if patient makes no progress towards goals or if patient is discharged from hospital.  The above assessment, treatment plan, treatment alternatives and goals were discussed and mutually agreed upon: by patient  Stepheni Cameron 10/23/2011, 3:34 PM

## 2011-10-23 NOTE — Evaluation (Signed)
Physical Therapy Assessment and Plan  Patient Details  Name: Jeremy [name] MRN: [id number] Date of Birth: Dec 25, 1965  PT Diagnosis: Abnormal posture, Cognitive deficits, Difficulty walking, Hemiplegia non-dominant, Hypertonia, Impaired sensation and Muscle weakness Rehab Potential: Fair ELOS: 4 weeks   Today's Date: 10/23/2011 Time: 0800-0900 and [id number] Time Calculation (min): 60 min and 28 min  Problem List:  Patient Active Problem List  Diagnosis  . ICH (intracerebral hemorrhage)  . HTN (hypertension)  . Cytotoxic cerebral edema  . Obstructive hydrocephalus  . Fever  . Tracheostomy dependent  . Dysphagia, PEG dependent    Past Medical History:  Past Medical History  Diagnosis Date  . Hypertension 10/01/2011    basal ganglia  . Stroke    Past Surgical History:  Past Surgical History  Procedure Date  . Tracheostomy tube placement 10/11/2011    Procedure: TRACHEOSTOMY;  Surgeon: Christia Reading, MD;  Location: [facility] OR;  Service: ENT;  Laterality: N/A;    Assessment & Plan Clinical Impression: Patient is a 46 y.o. male with history of HTN-no medications who was admitted on 10/01/11 with headache, N/V, and difficulty using left hand. CT head revealed R-BG hemorrhage with surrounding edema. Started on nicardipine drip for BP management. Patient developed somnolence due to extension of hemorrhage to right thalamus and posterior third ventricle with concerns for developing obstructive hydrocephalus. Patient intubated. Dr. Jordan Likes consulted and IVC placed. Did require replacement on 08/21 due to malfunction. He had difficulty weaning of vent required trach on 08/26 and gastrostomy tube placed by IVR on 08/28. Dr. Jordan Likes consulted and ventriculostomy drain placed and last removed by patient on 08/29. BP has been labile. He did develop fever 08/30 and was started on antibiotics for treatment. This resolved when CVL discontinued and patient has been off antibiotics. Did have episode of projectile  vomiting on 09/04 and KUB done without evidence of obstruction. TF rate adjusted with resolution of symptoms. Trach downsized to CFS #6 yesterday. FEES done today and regular diet initiated. Patient transferred to CIR on 10/22/2011 .   Patient currently requires total with mobility secondary to muscle weakness, decreased cardiorespiratoy endurance and decreased oxygen support, impaired timing and sequencing, abnormal tone, motor apraxia and decreased motor planning, decreased midline orientation and left side neglect, decreased initiation, decreased attention, decreased awareness, decreased problem solving and delayed processing and decreased sitting balance, decreased standing balance, decreased postural control, hemiplegia and decreased balance strategies.  Prior to hospitalization, patient was independent with mobility and lived with Spouse in a House home.  Home access is 3Stairs to enter.  Patient will benefit from skilled PT intervention to maximize safe functional mobility, minimize fall risk and decrease caregiver burden for planned discharge home with 24 hour assist.  Anticipate patient will benefit from follow up [facility] at discharge.  PT Evaluation Precautions/Restrictions Precautions Precautions: Fall Precaution Comments: trach collar with passy muir valve; continuous monitor of sats General @[id number] ([phone]) Vital Signs Therapy Vitals Pulse Rate: 100  Oxygen Therapy SpO2: 98 % O2 Device: None (Room air) Pulse Oximetry Type: Continuous Pain Pain Assessment Pain Assessment: No/denies pain Home Living/Prior Functioning Home Living Lives With: Spouse Available Help at Discharge: Family;Available 24 hours/day Type of Home: House Home Access: Stairs to enter [employer] of Steps: 3 Home Layout: One level Home Adaptive Equipment: None Prior Function Level of Independence: Independent with gait;Independent with transfers Able to Take Stairs?: Yes Driving:  Yes 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: Unable to fully assess secondary to patient  L neglect and cognitive, verbal impairments Coordination Gross Motor Movements are Fluid and Coordinated: No Fine Motor Movements are Fluid and Coordinated: No Motor  Motor Motor: Hemiplegia;Abnormal tone;Motor apraxia;Abnormal postural alignment and control Motor - Skilled Clinical Observations: L dense hemplegia, impaired initation, apraxia, pushes to L in unsupported sitting  Mobility Bed Mobility Supine to Sit: HOB elevated;1: +2 Total assist;With rails Supine to Sit Details (indicate cue type and reason): +2 A secondary to lethargy, impaired initiation and sequencing with UE on rail Transfers Lateral/Scoot Transfers: 1: +2 Total assist;With slide board;From elevated surface Lateral/Scoot Transfer Details (indicate cue type and reason): +2 Total A with pad on slideboard secondary to impaired initation, apraxia, pushing to L side and no activation of R side to assist with transfer Locomotion  Ambulation Ambulation: No (unsafe) Stairs / Additional Locomotion Stairs: No (unsafe) Wheelchair Mobility Wheelchair Mobility: No (placed in w/c but unable to sequence mobility)  Trunk/Postural Assessment  Postural Control Postural Control: Deficits on evaluation (Pushes to L, unable to self correct or monitor)  Balance Static Sitting Balance Static Sitting - Balance Support: Right upper extremity supported;Feet supported Static Sitting - Level of Assistance: 2: Max assist;1: +1 Total assist Static Sitting - Comment/# of Minutes: As patient fatigued pushing increased and required total verbal and visual cues to reach R to return trunk to midline Dynamic Sitting Balance Dynamic Sitting - Balance Support: Right upper extremity supported;Feet supported Dynamic Sitting - Level of Assistance: 1: +2 Total assist Extremity Assessment  RLE  Assessment RLE Assessment: Within Functional Limits LLE Assessment LLE Assessment: Exceptions to [facility] LLE Strength LLE Overall Strength: Deficits LLE Overall Strength Comments: 0/5 throughout LLE Tone LLE Tone: Moderate;Hypertonic  See FIM for current functional status Refer to Care Plan for Long Term Goals  Recommendations for other services: None  Discharge Criteria: Patient will be discharged from PT if patient refuses treatment 3 consecutive times without medical reason, if treatment goals not met, if there is a change in medical status, if patient makes no progress towards goals or if patient is discharged from hospital.  The above assessment, treatment plan, treatment alternatives and goals were discussed and mutually agreed upon: by patient  PM session: patient requesting to look at menu and order dinner.  Performed supine > sit EOB with +2 A with <10% initiation and assistance from patient for rolling and supine > sit.  Performed sitting balance and postural control training sitting EOB with focus on self correcting L lateral and forward LOB to midline and maintaining while looking at menu; unable to read menu so gave patient options to choose from.  Returned to supine with +2 A and had patient perform assisted bridge to reposition in bed with +2 A.    Edman Circle Faucette 10/23/2011, 12:37 PM

## 2011-10-23 NOTE — Evaluation (Signed)
Occupational Therapy Assessment and Plan  Patient Details  Name: Joesph Marcy MRN: [id number] Date of Birth: [birth year]  OT Diagnosis: abnormal posture, apraxia, blindness and low vision, cognitive deficits, disturbance of vision, flaccid hemiplegia and hemiparesis and hemiplegia affecting non-dominant side Rehab Potential:  fair ELOS:   4 weeks  Today's Date: 10/23/2011 Time: 8657-8469  1100-1230  (90 mins) Time Calculation (min): 28 min  Problem List:  Patient Active Problem List  Diagnosis  . ICH (intracerebral hemorrhage)  . HTN (hypertension)  . Cytotoxic cerebral edema  . Obstructive hydrocephalus  . Fever  . Tracheostomy dependent  . Dysphagia, PEG dependent    Past Medical History:  Past Medical History  Diagnosis Date  . Hypertension 10/01/2011    basal ganglia  . Stroke    Past Surgical History:  Past Surgical History  Procedure Date  . Tracheostomy tube placement 10/11/2011    Procedure: TRACHEOSTOMY;  Surgeon: Christia Reading, MD;  Location: [facility] OR;  Service: ENT;  Laterality: N/A;    Assessment & Plan Clinical Impression: : Muhannad Bignell is a 46 y.o. male with history of HTN-no medications x years who was admitted on 10/01/11 with headache, N/V, and difficulty using left hand. CT head revealed R-BG hemorrhage with surrounding edema. Started on nicardipine drip for BP management. Patient developed somnolence due to extension of hemorrhage to right thalamus and posterior third ventricle with concerns for developing obstructive hydrocephalus. Patient intubated. Dr. Jordan Likes consulted and IVC placed. Did require replacement on 08/21 due to malfunction. He had difficulty weaning of vent required trach on 08/26 and gastrostomy tube placed by IVR on 08/28. Dr. Jordan Likes consulted and ventriculostomy drain placed and last removed by patient on 08/29. BP has been labile. He did develop fever 08/30 and was started on antibiotics for treatment. This resolved when CVL discontinued and  patient has been off antibiotics. Did have episode of projectile vomiting on 09/04 and KUB done without evidence of obstruction. TF rate adjusted with resolution of symptoms. Trach downsized to CFS #6 yesterday. FEES done today and regular diet initiated. Patient continues with dense left hemiparesis with sensory deficits, left neglect,aphonia with cognitive deficits and visual deficts in upper fields. PT/OT/ST has been ongoing and CIR recommended for follow up therapies.  *.  Patient transferred to CIR on 10/22/2011 .    Patient currently requires total with basic self-care skills secondary to muscle paralysis, impaired timing and sequencing, abnormal tone, unbalanced muscle activation, motor apraxia and decreased motor planning, decreased visual acuity, decreased visual perceptual skills and decreased visual motor skills, decreased midline orientation, decreased attention to left and decreased motor planning and decreased initiation, decreased attention, decreased awareness, decreased problem solving, decreased safety awareness, decreased memory and delayed processing.  Prior to hospitalization, patient could complete BADL with no assistance.  Patient will benefit from skilled intervention to decrease level of assist with basic self-care skills prior to discharge home with care partner.  Anticipate patient will require moderate physical assestance and follow up home health.     OT Evaluation Precautions/Restrictions    General   Vital Signs Oxygen Therapy SpO2: 97 % O2 Device: Trach collar Pain Pain Assessment Pain Assessment: No/denies pain Home Living/Prior Functioning Home Living Lives With: Spouse Available Help at Discharge: Family;Available 24 hours/day Type of Home: House Home Access: Stairs to enter [employer] of Steps: 3 Entrance Stairs-Rails: None Home Layout: One level Bathroom Shower/Tub: Forensic scientist: Standard Bathroom Accessibility:  Yes How Accessible: Accessible via walker Home Adaptive Equipment: None  Additional Comments:  (fishing, cutting grass, wash cars, wrestling) IADL History Homemaking Responsibilities: Yes Meal Prep Responsibility: Primary Laundry Responsibility: Primary Cleaning Responsibility: No Bill Paying/Finance Responsibility: Primary Shopping Responsibility: Primary Current License: Yes Mode of Transportation: Car Education: 12th grade education 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 Level of Independence: Independent with gait;Independent with transfers Able to Take Stairs?: Yes Driving: Yes 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 ADL   Vision/Perception   Diplopia, eye malalignment, decreased oculomotor     Cognition Overall Cognitive Status: Impaired Arousal/Alertness: Awake/alert Orientation Level: Oriented to person;Oriented to situation Focused Attention: Appears intact Sustained Attention: Impaired Sustained Attention Impairment: Verbal basic;Functional basic Memory: Impaired Memory Impairment: Decreased short term memory;Decreased recall of new information Decreased Short Term Memory: Verbal basic;Functional basic Awareness: Impaired Awareness Impairment: Intellectual impairment;Emergent impairment Problem Solving: Impaired Problem Solving Impairment: Verbal basic;Functional basic Executive Function: Reasoning;Self Monitoring;Decision Making;Sequencing;Organizing;Initiating;Self Correcting Reasoning: Impaired Reasoning Impairment: Verbal basic;Functional basic Sequencing: Impaired Sequencing Impairment: Verbal basic;Functional basic Organizing: Impaired Organizing Impairment: Verbal basic;Functional basic Decision Making: Impaired Decision Making Impairment: Verbal basic;Functional basic Initiating: Impaired Initiating Impairment: Verbal basic;Functional basic Self Monitoring: Impaired Self  Monitoring Impairment: Verbal basic;Functional basic Self Correcting: Impaired Self Correcting Impairment: Verbal basic;Functional basic Behaviors: Perseveration Safety/Judgment: Impaired Sensation:      Decreased light touch and proprioception LUE Motor    Mobility     Trunk/Postural Assessment     Balance Total assist with static sitting   Extremity/Trunk Assessment    LUE is flaccid and pt has inattention to left    See FIM for current functional status Refer to Care Plan for Long Term Goals  Recommendations for other services: None  Discharge Criteria: Patient will be discharged from OT if patient refuses treatment 3 consecutive times without medical reason, if treatment goals not met, if there is a change in medical status, if patient makes no progress towards goals or if patient is discharged from hospital.  The above assessment, treatment plan, treatment alternatives and goals were discussed and mutually agreed upon: by patient  Humberto Seals 10/23/2011, 6:00 PM

## 2011-10-24 ENCOUNTER — Inpatient Hospital Stay (HOSPITAL_COMMUNITY): Payer: BC Managed Care – PPO | Admitting: *Deleted

## 2011-10-24 NOTE — Progress Notes (Signed)
Subjective/Complaints: Janina Mayo became dislodged. Resting comfortably currently A 12 point review of systems has been performed and if not noted above is otherwise negative.   Objective: Vital Signs: Blood pressure 136/92, pulse 77, temperature 97.8 F (36.6 C), temperature source Oral, resp. rate 18, SpO2 98.00%. No results found.  Basename 10/22/11 0500  WBC 8.2  HGB 9.9*  HCT 30.3*  PLT 363    Basename 10/22/11 0500  NA 137  K 3.8  CL 101  CO2 27  GLUCOSE 105*  BUN 20  CREATININE 0.71  CALCIUM 9.9   CBG (last 3)   Basename 10/22/11 0041 10/21/11 2137  GLUCAP 90 104*    Wt Readings from Last 3 Encounters:  10/18/11 121.1 kg (266 lb 15.6 oz)  10/18/11 121.1 kg (266 lb 15.6 oz)    Physical Exam:  Nursing note and vitals reviewed.  Constitutional: He is oriented to person, place, and time. He appears well-developed and well-nourished.  Morbidly obese male with staples right scalp and tracheostomy. #6 trach  HENT:  Head: Normocephalic and atraumatic.  Tongue with greenish white coating. Poor dentition. Missing multiple teeth  Eyes: Pupils are equal, round, and reactive to light.  Unable to close left eye. Dysconjugate gaze, exopthalmus. Cannot gaze upward Neck:  Trach in place with crusted and yellowish secretions. No distress Cardiovascular: Normal rate and regular rhythm.  Pulmonary/Chest: Effort normal.  Bilateral rhonchi due to upper airway sounds still present. Abdominal: Soft. Bowel sounds are normal. He exhibits no distension. There is no tenderness.  PEG site clean and dry.  Musculoskeletal: He exhibits tenderness (left knee with ROM.).  Neurological: He is alert and oriented to person, place, and time. He displays abnormal reflex.  Able to state DOB, current month but not the date. Polite and appropriate. Follows basic commands without difficulty. Aphonia noted, severe dysarthria. Left facial weakness. Unable to look upward. Dense left hemiparesis with  flexor tone at knee. sustained clonus left foot. Senses pain in left leg and arm. LUE 0 to trace. LLE 0-1 on MMT Skin: Skin is warm and dry.    Assessment/Plan: 1. 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 Physiatrist is providing close team supervision and 24 hour management of active medical problems listed below. Physiatrist and rehab team continue to assess barriers to discharge/monitor patient progress toward functional and medical goals. FIM: FIM - Bathing Bathing Steps Patient Completed: Chest;Left Arm;Abdomen Bathing: 1: Total-Patient completes 0-2 of 10 parts or less than 25%  FIM - Upper Body Dressing/Undressing Upper body dressing/undressing: 0: Activity did not occur FIM - Lower Body Dressing/Undressing Lower body dressing/undressing: 0: Activity did not occur     FIM - Archivist Transfers: 0-Activity did not occur  FIM - Banker Devices: Sliding board Bed/Chair Transfer: 1: Mechanical lift  FIM - Locomotion: Wheelchair Locomotion: Wheelchair: 1: Total Assistance/staff pushes wheelchair (Pt<25%) FIM - Locomotion: Ambulation Locomotion: Ambulation: 0: Activity did not occur (unsafe)  Comprehension Comprehension Mode: Auditory Comprehension: 3-Understands basic 50 - 74% of the time/requires cueing 25 - 50%  of the time  Expression Expression Mode: Verbal Expression Assistive Devices: 6-Talk trach valve Expression: 3-Expresses basic 50 - 74% of the time/requires cueing 25 - 50% of the time. Needs to repeat parts of sentences.  Social Interaction Social Interaction: 3-Interacts appropriately 50 - 74% of the time - May be physically  or verbally inappropriate.  Problem Solving Problem Solving: 2-Solves basic 25 - 49% of the time - needs  direction more than half the time to initiate, plan or complete simple activities  Memory Memory: 1-Recognizes or recalls less than 25% of the time/requires cueing greater than 75% of the time  Medical Problem List and Plan:  1. DVT Prophylaxis/Anticoagulation: Pharmaceutical: Lovenox  2. Pain Management: will add prn baclofen for spasticity and voltaren gel for left knee pain.  3. Mood: No signs of distress noted. Able to state that he had a stroke but has poor awareness of deficits.  4. Neuropsych: This patient is is not capable of making decisions on his/her own behalf.  5. HTN: Monitor with bid checks. continue metoprolol bid. Improvement overall 6. ABLA: will recheck on 09/09. Will add iron supplement if tolerating new diet without recurrent N/V 7. Tone:  baclofen trial. PRAFO for LLW 8. Pulmonary: #6 trach. Secretions still notable. sats good.  -downsize Monday? LOS (Days) 2 A FACE TO FACE EVALUATION WAS PERFORMED  SWARTZ,ZACHARY T 10/24/2011, 6:45 AM

## 2011-10-24 NOTE — Progress Notes (Signed)
Called to patient room due to dislodged trach.  #6 cuffless shiley trach replaced without incident.  Positive color change on endtidal C02 detector.  Small amount of blood in sputum.  Otherwise, no adverse effects.  RN at the bedside.

## 2011-10-24 NOTE — Progress Notes (Signed)
Occupational Therapy Note  Patient Details  Name: Jeremy [name] MRN: [id number] Date of Birth: 05-31-1965 Today's Date: 10/24/2011  Time:  0905-1030  (85 min) Pain:  Occasional left leg pain during treatment Individual therapy  Pt lying in bed upon OT arrival.  Addressed bathing and dressing at supine level. Performed rolling to right and left with max to total assist.  Pt assisted with bathing with OT lifting impaired extremities.  He is very motivated to do for self.  Used lift to place pt in recliner.  Pt maintaining midline better in recliner today than yesterday.  Sat in recliner for 2 hours today.  Needs to be educated on pressure relief due to stage 2 on buttocks.    Humberto Seals 10/24/2011, 10:45 AM

## 2011-10-25 ENCOUNTER — Inpatient Hospital Stay (HOSPITAL_COMMUNITY): Payer: BC Managed Care – PPO | Admitting: Speech Pathology

## 2011-10-25 ENCOUNTER — Inpatient Hospital Stay (HOSPITAL_COMMUNITY): Payer: BC Managed Care – PPO

## 2011-10-25 ENCOUNTER — Inpatient Hospital Stay (HOSPITAL_COMMUNITY): Payer: BC Managed Care – PPO | Admitting: Physical Therapy

## 2011-10-25 DIAGNOSIS — I10 Essential (primary) hypertension: Secondary | ICD-10-CM

## 2011-10-25 DIAGNOSIS — J9600 Acute respiratory failure, unspecified whether with hypoxia or hypercapnia: Secondary | ICD-10-CM

## 2011-10-25 DIAGNOSIS — Z5189 Encounter for other specified aftercare: Secondary | ICD-10-CM

## 2011-10-25 DIAGNOSIS — I619 Nontraumatic intracerebral hemorrhage, unspecified: Secondary | ICD-10-CM

## 2011-10-25 LAB — COMPREHENSIVE METABOLIC PANEL
ALT: 77 U/L — ABNORMAL HIGH (ref 0–53)
AST: 50 U/L — ABNORMAL HIGH (ref 0–37)
Albumin: 2.9 g/dL — ABNORMAL LOW (ref 3.5–5.2)
Alkaline Phosphatase: 102 U/L (ref 39–117)
BUN: 17 mg/dL (ref 6–23)
CO2: 28 mEq/L (ref 19–32)
Calcium: 9.9 mg/dL (ref 8.4–10.5)
Chloride: 100 mEq/L (ref 96–112)
Creatinine, Ser: 0.84 mg/dL (ref 0.50–1.35)
GFR calc Af Amer: >90 mL/min (ref >90)
GFR calc non Af Amer: >90 mL/min (ref >90)
Glucose, Bld: 102 mg/dL — ABNORMAL HIGH (ref 70–99)
Potassium: 3.8 mEq/L (ref 3.5–5.1)
Sodium: 136 mEq/L (ref 135–145)
Total Bilirubin: 0.4 mg/dL (ref 0.3–1.2)
Total Protein: 7.8 g/dL (ref 6.0–8.3)

## 2011-10-25 LAB — CBC WITH DIFFERENTIAL/PLATELET
Basophils Absolute: 0 10*3/uL (ref 0.0–0.1)
Basophils Relative: 0 % (ref 0–1)
Eosinophils Absolute: 0.3 10*3/uL (ref 0.0–0.7)
Eosinophils Relative: 5 % (ref 0–5)
HCT: 32 % — ABNORMAL LOW (ref 39.0–52.0)
Hemoglobin: 10.2 g/dL — ABNORMAL LOW (ref 13.0–17.0)
Lymphocytes Relative: 25 % (ref 12–46)
Lymphs Abs: 1.4 10*3/uL (ref 0.7–4.0)
MCH: 28.7 pg (ref 26.0–34.0)
MCHC: 31.9 g/dL (ref 30.0–36.0)
MCV: 89.9 fL (ref 78.0–100.0)
Monocytes Absolute: 0.6 10*3/uL (ref 0.1–1.0)
Monocytes Relative: 11 % (ref 3–12)
Neutro Abs: 3.3 10*3/uL (ref 1.7–7.7)
Neutrophils Relative %: 59 % (ref 43–77)
Platelets: 410 10*3/uL — ABNORMAL HIGH (ref 150–400)
RBC: 3.56 MIL/uL — ABNORMAL LOW (ref 4.22–5.81)
RDW: 13.4 % (ref 11.5–15.5)
WBC: 5.6 10*3/uL (ref 4.0–10.5)

## 2011-10-25 MED ORDER — SODIUM CHLORIDE 0.9 % IJ SOLN
10.0000 mL | INTRAMUSCULAR | Status: DC | PRN
  Administered 2011-10-25: 30 mL
  Administered 2011-10-28 – 2011-10-29 (×4): 10 mL

## 2011-10-25 MED ORDER — ENSURE COMPLETE PO LIQD
237.0000 mL | Freq: Two times a day (BID) | ORAL | Status: DC
  Administered 2011-10-25 – 2011-11-16 (×33): 237 mL via ORAL

## 2011-10-25 MED ORDER — BACLOFEN 10 MG PO TABS
10.0000 mg | ORAL_TABLET | Freq: Three times a day (TID) | ORAL | Status: DC
  Administered 2011-10-25 – 2011-10-27 (×8): 10 mg
  Filled 2011-10-25 (×10): qty 1

## 2011-10-25 MED ORDER — ADULT MULTIVITAMIN W/MINERALS CH
1.0000 | ORAL_TABLET | Freq: Every day | ORAL | Status: DC
  Administered 2011-10-25 – 2011-11-08 (×15): 1 via ORAL
  Filled 2011-10-25 (×19): qty 1

## 2011-10-25 MED ORDER — PRO-STAT SUGAR FREE PO LIQD
30.0000 mL | Freq: Two times a day (BID) | ORAL | Status: DC
  Administered 2011-10-25 – 2011-11-11 (×32): 30 mL via ORAL
  Filled 2011-10-25 (×39): qty 30

## 2011-10-25 NOTE — Progress Notes (Signed)
Occupational Therapy Session Note  Patient Details  Name: Jeremy [name] MRN: [id number] Date of Birth: [birth year]  Today's Date: 10/25/2011 Time: 0830-0930 Time Calculation (min): 60 min  Short Term Goals: Week 1:  OT Short Term Goal 1 (Week 1): Pt. will sit with midline control unsupported for 3 mins duriing bathing OT Short Term Goal 2 (Week 1): Pt. will bathe left UE with mod assist. OT Short Term Goal 3 (Week 1): Pt. will dress UB with moderate assist.   OT Short Term Goal 4 (Week 1): Pt. will leteral lean with max assist  Skilled Therapeutic Interventions/Progress Updates:  ADL re-training performed at bed level this AM. Patient is a severe left hemi and is unable to participate much during ADL. LB bathing and dressing only completed secondary to time constraint. Pt required a new w/c, lift sling, and left hemi lap tray. Patient would benefit from a second person during ADL to increase functional performance and safety for patient and therapist.   Therapy Documentation Precautions:  Precautions Precautions: Fall Precaution Comments: trach collar with passy muir valve; continuous monitor of sats Restrictions Weight Bearing Restrictions: No Pain: Pain Assessment Pain Assessment: No/denies pain  See FIM for current functional status  Therapy/Group: Individual Therapy  Anne Sebring, Charisse March 10/25/2011, 9:26 AM

## 2011-10-25 NOTE — Progress Notes (Signed)
INITIAL ADULT NUTRITION ASSESSMENT Date: 10/25/2011   Time: 10:18 AM  Reason for Assessment: Health History  ASSESSMENT: Male 46 y.o.  Dx: ICH  Hx:  Past Medical History  Diagnosis Date  . Hypertension 10/01/2011    basal ganglia  . Stroke    Past Surgical History  Procedure Date  . Tracheostomy tube placement 10/11/2011    Procedure: TRACHEOSTOMY;  Surgeon: Christia Reading, MD;  Location: [facility] OR;  Service: ENT;  Laterality: N/A;   Related Meds:     . antiseptic oral rinse  15 mL Mouth Rinse Q2H  . baclofen  10 mg Per Tube TID  . diclofenac sodium  2 g Topical QID  . enoxaparin  40 mg Subcutaneous Q24H  . metoprolol tartrate  25 mg Per Tube Q12H  . naphazoline-glycerin  2 drop Left Eye QID  . senna-docusate  1 tablet Oral BID  . DISCONTD: baclofen  5 mg Per Tube TID   Ht: 5\' 10"  (177.8 cm)  Wt:  266 lb (121.1 kg)  Ideal Wt:    75.5 kg % Ideal Wt: 160%  Wt Readings from Last 15 Encounters:  10/18/11 266 lb 15.6 oz (121.1 kg)  10/18/11 266 lb 15.6 oz (121.1 kg)  Usual Wt: 300 lb - per pt report (unable to state when he last weighed this) % Usual Wt: 89%  BMI is 38.3 - Pt is obese, class II.  Food/Nutrition Related Hx: pt with PEG from acute hospitalization  Labs:  CMP     Component Value Date/Time   NA 137 10/22/2011 0500   K 3.8 10/22/2011 0500   CL 101 10/22/2011 0500   CO2 27 10/22/2011 0500   GLUCOSE 105* 10/22/2011 0500   BUN 20 10/22/2011 0500   CREATININE 0.71 10/22/2011 0500   CALCIUM 9.9 10/22/2011 0500   PROT 8.6* 10/02/2011 2206   ALBUMIN 3.5 10/02/2011 2206   AST 24 10/02/2011 2206   ALT 22 10/02/2011 2206   ALKPHOS 96 10/02/2011 2206   BILITOT 0.3 10/02/2011 2206   GFRNONAA >90 10/22/2011 0500   GFRAA >90 10/22/2011 0500    Intake/Output Summary (Last 24 hours) at 10/25/11 1025 Last data filed at 10/25/11 1000  Gross per 24 hour  Intake    230 ml  Output    575 ml  Net   -345 ml  BM 9/9  Diet Order: General  Supplements/Tube Feeding: none  IVF:     Estimated Nutritional Needs:   Kcal: 2200 - 2400 kcal Protein: 115 - 130 grams protein Fluid: 2.2 - 2.4 liters daily  Pt was followed by RD during acute hospitalization. Pt was given enteral nutrition during intubation. Underwent PEG placement on 8/28. Pt with an episode of projectile vomiting on 9/4 and KUB done without evidence of obstruction. TF rate adjusted with resolution of symptoms. FEES on 9/6 and regular diet with thin liquids initiated.   Intake appears to be slowly improving, most recently pt is eating 75% of meals. States he follows a regular diet PTA.  Noted pt with poor dentition. Discussed need for adequate nutrition during rehab, pt agreeable to Ensure Complete.  Fissure on sacrum. Pt is at nutrition risk given recent acute hospitalization and acute illness.  NUTRITION DIAGNOSIS: -Inadequate oral intake (NI-2.1).  Status: Ongoing  RELATED TO: poor appetite  AS EVIDENCE BY: variable intake  MONITORING/EVALUATION(Goals): Goal: Pt to meet >/= 90% of their estimated nutrition needs Monitor: weight trends, lab trends, I/O's, PO intake, supplement tolerance  EDUCATION NEEDS: -Education needs  addressed  INTERVENTION: 1. Multivitamin Daily 2. Ensure Complete po BID, each supplement provides 350 kcal and 13 grams of protein. 3. 30 ml Prostat po BID, each supplement provides 100 kcal and 15 grams protein. 4. If pt unable to meet nutrition needs consistently, consider nocturnal enteral nutrition to meet nutrition needs  DOCUMENTATION CODES Per approved criteria  -Obesity Unspecified   Jarold Motto MS, RD, LDN Pager: [phone] After-hours pager: [phone]

## 2011-10-25 NOTE — Progress Notes (Signed)
Patient information reviewed and entered into [facility] system by Blayde Bacigalupi, RN, CRRN, PPS Coordinator.  Information including medical coding and functional independence measure will be reviewed and updated through discharge.    

## 2011-10-25 NOTE — Progress Notes (Signed)
Physical Therapy Session Note  Patient Details  Name: Jeremy [name] MRN: [id number] Date of Birth: February 05, 1966  Today's Date: 10/25/2011 Time:  9:40-10:07 27 min Second Treatment:  14:59-15:19 20 min  Short Term Goals: Week 1:  PT Short Term Goal 1 (Week 1): Patient will perform bed mobility with max A and max verbal cues for initiation and sequencing PT Short Term Goal 2 (Week 1): Patient will tolerate unuspported sitting for balance and postural control training x 10 minutes with mod A overall PT Short Term Goal 3 (Week 1): Patient will begin w/c mobility training in appropriate w/c x 25' with max A and max verbal/visual/tactile cues for sequencing and initiation. PT Short Term Goal 4 (Week 1): Patient will tolerate standing in lift equipment total A x 5 minutes during functional acitivity to focus on attention to L side  Skilled Therapeutic Interventions/Progress Updates:   Upon entering the room pt was about to fall out of his w/c and was reporting that he needed to use the bathroom that he was having a BM.  Got pt sitting back up in w/c and called for assistance.  Use maxi move to return pt to bed to perform rolling with max @ to be cleaned.  Found a tilt in space w/c for pt to use and was going to return pt to new w/c when he reported he was having another BM, so pt was placed on the bed pan.  Second Treatment:  Pt in bed, reported that he needed to be cleaned up.  RN assisted in getting pt cleaned and then getting pt up in maxi move to get into w/c.  Pt's trach coming out and respiratory called.  Respiratory came and pt had to be returned to bed to have trach downsized. Therapy Documentation Precautions:  Precautions Precautions: Fall Precaution Comments: trach collar with passy muir valve; continuous monitor of sats Restrictions Weight Bearing Restrictions: No Vital Signs: Therapy Vitals Pulse Rate: 82  Resp: 16  Patient Position, if appropriate: Lying Oxygen Therapy SpO2: 95  % O2 Device: None (Room air) Pain: Pain Assessment Pain Assessment: No/denies pain See FIM for current functional status  Therapy/Group: Individual Therapy  [name] 10/25/2011, 10:29 AM

## 2011-10-25 NOTE — Progress Notes (Signed)
Speech Language Pathology Daily Session Note  Patient Details  Name: Jeremy [name] MRN: [id number] Date of Birth: [birth year]  Today's Date: 10/25/2011 Time: 1191-4782 Time Calculation (min): 55 min  Short Term Goals: Week 1: SLP Short Term Goal 1 (Week 1): Pt will utilize increased vocal intensity at the phrase level with Min verbal cues.  SLP Short Term Goal 2 (Week 1): Pt will orient to place, time and situation with supervision contextual and enviornmental cues.  SLP Short Term Goal 3 (Week 1): Pt will sustain attention to functional task for ~10 minutes with Mod A verbal cues for redirection  SLP Short Term Goal 4 (Week 1): Pt will attend to left field of enviornments/body during functional tasks with Mod A verbal and semantic cues.  SLP Short Term Goal 5 (Week 1): Pt will initiate functional tasks with Min A verbal cues.  SLP Short Term Goal 6 (Week 1): Pt will demonstrate functional problem solving for functional and familair tasks with Mod A verbal cues.   Skilled Therapeutic Interventions: Pt in bed when clinician arrived and reported that he needed to be cleaned up. RN assisted in getting pt clean and repositioned. Treatment focus on vocal intensity and working memory. Pt required Mod verbal cues to self-monitor and correct voice intensity at the phrase level. Pt oriented X 3 with Mod I and demonstrated intellectual awareness of physical and cognitive deficits with Max semantic cues. Pt tolerating PMSV during all waking hours, however, reported he prefers to only wear the PMSV with supervision. Nursing and therapy staff made aware.    FIM:  Comprehension Comprehension Mode: Auditory Comprehension: 3-Understands basic 50 - 74% of the time/requires cueing 25 - 50%  of the time Expression Expression Mode: Verbal Expression: 3-Expresses basic 50 - 74% of the time/requires cueing 25 - 50% of the time. Needs to repeat parts of sentences. Social Interaction Social Interaction:  3-Interacts appropriately 50 - 74% of the time - May be physically or verbally inappropriate. Problem Solving Problem Solving: 2-Solves basic 25 - 49% of the time - needs direction more than half the time to initiate, plan or complete simple activities Memory Memory: 2-Recognizes or recalls 25 - 49% of the time/requires cueing 51 - 75% of the time  Pain Pain Assessment Pain Assessment: No/denies pain  Therapy/Group: Individual Therapy  Rande Roylance 10/25/2011, 4:32 PM

## 2011-10-25 NOTE — Progress Notes (Signed)
Patient's #6 shiley trach was removed accidentally this afternoon. Respiratory called, downsized patient to #4 shiley per order. Patient's #4 shiley was then removed accidentally at 1845. Respiratory called, [name] paged. Patient's oxygenation status was WDL on room air with no trach in place. [name] ordered trach to be discontinued and for patient to remain on continuous pulse ox to monitor overnight. Pressure dressing to be placed over trach site. Patient's trach site is large and open with breakdown to the bottom. Will contact WOC r/t breakdown and continue to monitor patient. Patient is currently on room air with O2 sat 96% and no signs of distress. Jeremy [name]

## 2011-10-25 NOTE — Plan of Care (Signed)
Problem: RH BLADDER ELIMINATION Goal: RH STG MANAGE BLADDER WITH ASSISTANCE STG Manage Bladder With Max Assistance  Outcome: Progressing Wearing condom cath @ hs

## 2011-10-25 NOTE — Progress Notes (Signed)
Subjective/Complaints: No complaints. Ready for therapy! A 12 point review of systems has been performed and if not noted above is otherwise negative.   Objective: Vital Signs: Blood pressure 149/84, pulse 86, temperature 98.6 F (37 C), temperature source Oral, resp. rate 20, SpO2 96.00%. No results found. No results found for this basename: WBC:2,HGB:2,HCT:2,PLT:2 in the last 72 hours No results found for this basename: NA:2,K:2,CL:2,CO2:2,GLUCOSE:2,BUN:2,CREATININE:2,CALCIUM:2 in the last 72 hours CBG (last 3)  No results found for this basename: GLUCAP:3 in the last 72 hours  Wt Readings from Last 3 Encounters:  10/18/11 121.1 kg (266 lb 15.6 oz)  10/18/11 121.1 kg (266 lb 15.6 oz)    Physical Exam:  Nursing note and vitals reviewed.  Constitutional: He is oriented to person, place, and time. He appears well-developed and well-nourished.  Morbidly obese male with staples right scalp and tracheostomy. #6 trach  HENT:  Head: Normocephalic and atraumatic.  Tongue with greenish white coating. Poor dentition. Missing multiple teeth  Eyes: Pupils are equal, round, and reactive to light.  Unable to close left eye. Dysconjugate gaze, exopthalmus. Cannot gaze upward Neck:  Trach in place, PMV/ secretions decreased. No distress Cardiovascular: Normal rate and regular rhythm.  Pulmonary/Chest: Effort normal.  Bilateral rhonchi decreased. No distress Abdominal: Soft. Bowel sounds are normal. He exhibits no distension. There is no tenderness.  PEG site clean and dry.  Musculoskeletal: He exhibits tenderness (left knee with ROM.).  Neurological: He is alert and oriented to person, place, and time. He displays abnormal reflex.  Able to state DOB, current month but not the date. Polite and appropriate. Follows basic commands without difficulty. Aphonia noted, severe dysarthria. Left facial weakness. Unable to look upward. Dense left hemiparesis with flexor tone at knee. sustained clonus  left foot. Senses pain in left leg and arm. LUE 0 to trace. LLE 0-1 on MMT Skin: Skin is warm and dry.    Assessment/Plan: 1. 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 Physiatrist is providing close team supervision and 24 hour management of active medical problems listed below. Physiatrist and rehab team continue to assess barriers to discharge/monitor patient progress toward functional and medical goals. FIM: FIM - Bathing Bathing Steps Patient Completed: Chest;Left Arm;Abdomen;Front perineal area;Right upper leg Bathing: 2: Max-Patient completes 3-4 68f 10 parts or 25-49%  FIM - Upper Body Dressing/Undressing Upper body dressing/undressing: 0: Activity did not occur FIM - Lower Body Dressing/Undressing Lower body dressing/undressing: 0: Activity did not occur     FIM - Archivist Transfers: 0-Activity did not occur  FIM - Banker Devices: Sliding board Bed/Chair Transfer: 1: Mechanical lift  FIM - Locomotion: Wheelchair Locomotion: Wheelchair: 1: Total Assistance/staff pushes wheelchair (Pt<25%) FIM - Locomotion: Ambulation Locomotion: Ambulation: 0: Activity did not occur (unsafe)  Comprehension Comprehension Mode: Auditory Comprehension: 3-Understands basic 50 - 74% of the time/requires cueing 25 - 50%  of the time  Expression Expression Mode: Verbal Expression Assistive Devices: 6-Talk trach valve Expression: 3-Expresses basic 50 - 74% of the time/requires cueing 25 - 50% of the time. Needs to repeat parts of sentences.  Social Interaction Social Interaction: 3-Interacts appropriately 50 - 74% of the time - May be physically or verbally inappropriate.  Problem Solving Problem Solving: 2-Solves basic 25 - 49% of the time - needs direction more than  half the time to initiate, plan or  complete simple activities  Memory Memory: 1-Recognizes or recalls less than 25% of the time/requires cueing greater than 75% of the time  Medical Problem List and Plan:  1. DVT Prophylaxis/Anticoagulation: Pharmaceutical: Lovenox  2. Pain Management:   prn baclofen for spasticity and voltaren gel for left knee pain.  3. Mood: No signs of distress noted.  4. Neuropsych: This patient is is not capable of making decisions on his/her own behalf.  5. HTN: Monitor with bid checks. continue metoprolol bid. Improvement overall 6. ABLA: will recheck on 09/09.   iron supplement if tolerating new diet without recurrent N/V 7. Tone:  baclofen trial. PRAFO for LLW 8. Pulmonary: #6 trach. Secretions are improved. Still poor vocal quality  -downsize to #4 cuffless trach today LOS (Days) 3 A FACE TO FACE EVALUATION WAS PERFORMED  Marthe Dant T 10/25/2011, 7:44 AM

## 2011-10-25 NOTE — Progress Notes (Signed)
10/25/2011- 1532- Pt trach dislodged by pt.  Pt with order to downsize to #4 cuffless trach.  Trach secured with velcro ties.  Pt sats 96% on room air after trach change.  Pt vocalizing around trach.  Color change noted with [employer].  Continuous sat monitor at bedside. All emergency equip at bedside.  Will cont to monitor progress.

## 2011-10-25 NOTE — Discharge Summary (Signed)
Agree with DC summary by NP Chauncey Cruel, MD ; [facility] service Mobile [phone].  After 5:30 PM or weekends, call [phone]

## 2011-10-25 NOTE — Progress Notes (Signed)
Janina Mayo was discontinued per [name], PA because it has fallen out twice today and patient's oxygen status is WDL on RA. Hedy Camara

## 2011-10-25 NOTE — Progress Notes (Signed)
Orthopedic Tech Progress Note Patient Details:  Jeremy [name] 07/29/1965 [id number]  Ortho Devices Ortho Device/Splint Location: prafo boot order in nursing orders Ortho Device/Splint Interventions: Application   Cammer, Mickie Bail 10/25/2011, 11:07 AM

## 2011-10-25 NOTE — Progress Notes (Addendum)
PRAFO boot ordered and applied to patient's LLE. Prevalon boot credited to patient. One suture was still intact on scalp incision, suture removed. Incision closed, clean dry intact. Hedy Camara

## 2011-10-26 ENCOUNTER — Inpatient Hospital Stay (HOSPITAL_COMMUNITY): Payer: BC Managed Care – PPO

## 2011-10-26 ENCOUNTER — Inpatient Hospital Stay (HOSPITAL_COMMUNITY): Payer: BC Managed Care – PPO | Admitting: Speech Pathology

## 2011-10-26 ENCOUNTER — Inpatient Hospital Stay (HOSPITAL_COMMUNITY): Payer: BC Managed Care – PPO | Admitting: *Deleted

## 2011-10-26 ENCOUNTER — Inpatient Hospital Stay (HOSPITAL_COMMUNITY): Payer: BC Managed Care – PPO | Admitting: Occupational Therapy

## 2011-10-26 DIAGNOSIS — Z5189 Encounter for other specified aftercare: Secondary | ICD-10-CM

## 2011-10-26 DIAGNOSIS — I619 Nontraumatic intracerebral hemorrhage, unspecified: Secondary | ICD-10-CM

## 2011-10-26 DIAGNOSIS — J9600 Acute respiratory failure, unspecified whether with hypoxia or hypercapnia: Secondary | ICD-10-CM

## 2011-10-26 DIAGNOSIS — I10 Essential (primary) hypertension: Secondary | ICD-10-CM

## 2011-10-26 NOTE — Progress Notes (Signed)
Speech Language Pathology Daily Session Note  Patient Details  Name: Jeremy [name] MRN: [id number] Date of Birth: 04/10/1965  Today's Date: 10/26/2011 Time: 1130-1215 Time Calculation (min): 45 min  Short Term Goals: Week 1: SLP Short Term Goal 1 (Week 1): Pt will utilize increased vocal intensity at the phrase level with Min verbal cues.  SLP Short Term Goal 2 (Week 1): Pt will orient to place, time and situation with supervision contextual and enviornmental cues.  SLP Short Term Goal 3 (Week 1): Pt will sustain attention to functional task for ~10 minutes with Mod A verbal cues for redirection  SLP Short Term Goal 4 (Week 1): Pt will attend to left field of enviornments/body during functional tasks with Mod A verbal and semantic cues.  SLP Short Term Goal 5 (Week 1): Pt will initiate functional tasks with Min A verbal cues.  SLP Short Term Goal 6 (Week 1): Pt will demonstrate functional problem solving for functional and familair tasks with Mod A verbal cues.   Skilled Therapeutic Interventions: Treatment focus on utilization of swallowing compensatory strategies with regular textures and thin liquids. Pt required Mod verbal cues to self-monitor and correct rate of intake and bite/sip size. Pt's wife present throughout session and demonstrated appropriate cueing for utilization of strategies and safe to supervise pt during meals. Pt required Mod verbal cues for redirection to task for ~ 10 minutes throughout session.    FIM:  Comprehension Comprehension: 3-Understands basic 50 - 74% of the time/requires cueing 25 - 50%  of the time Expression Expression Mode: Verbal Expression: 2-Expresses basic 25 - 49% of the time/requires cueing 50 - 75% of the time. Uses single words/gestures. Social Interaction Social Interaction: 3-Interacts appropriately 50 - 74% of the time - May be physically or verbally inappropriate. Problem Solving Problem Solving: 1-Solves basic less than 25% of the time -  needs direction nearly all the time or does not effectively solve problems and may need a restraint for safety Memory Memory: 1-Recognizes or recalls less than 25% of the time/requires cueing greater than 75% of the time FIM - Eating Eating Activity: 5: Supervision/cues  Pain Pain Assessment Pain Assessment: No/denies pain  Therapy/Group: Individual Therapy  Thursa Emme 10/26/2011, 4:28 PM

## 2011-10-26 NOTE — Progress Notes (Signed)
Subjective/Complaints: Jeremy [name] came out after downsized. Decision was made to leave out as he appeared stable from a respiratory standpoint. A 12 point review of systems has been performed and if not noted above is otherwise negative.   Objective: Vital Signs: Blood pressure 157/87, pulse 79, temperature 98.4 F (36.9 C), temperature source Oral, resp. rate 18, SpO2 100.00%. No results found.  Basename 10/25/11 1115  WBC 5.6  HGB 10.2*  HCT 32.0*  PLT 410*    Basename 10/25/11 1115  NA 136  K 3.8  CL 100  CO2 28  GLUCOSE 102*  BUN 17  CREATININE 0.84  CALCIUM 9.9   CBG (last 3)  No results found for this basename: GLUCAP:3 in the last 72 hours  Wt Readings from Last 3 Encounters:  10/18/11 121.1 kg (266 lb 15.6 oz)  10/18/11 121.1 kg (266 lb 15.6 oz)    Physical Exam:  Nursing note and vitals reviewed.  Constitutional: He is oriented to person, place, and time. He appears well-developed and well-nourished.  Morbidly obese male with staples right scalp and tracheostomy. #6 trach  HENT:  Head: Normocephalic and atraumatic.  Tongue with greenish white coating. Poor dentition. Missing multiple teeth  Eyes: Pupils are equal, round, and reactive to light.  Unable to close left eye. Dysconjugate gaze, exopthalmus. Cannot gaze upward Neck:  Trach out. Wound dressed.  Cardiovascular: Normal rate and regular rhythm.  Pulmonary/Chest: Effort normal.  Bilateral rhonchi decreased. No distress. No cough Abdominal: Soft. Bowel sounds are normal. He exhibits no distension. There is no tenderness.  PEG site clean and dry.  Musculoskeletal: He exhibits tenderness (left knee with ROM.).  Neurological: He is alert and oriented to person, place, and time. He displays abnormal reflex.  Able to state DOB, current month but not the date. Polite and appropriate. Follows basic commands without difficulty. Aphonia noted, severe dysarthria. Left facial weakness. Unable to look upward. Dense  left hemiparesis with flexor tone at knee. sustained clonus left foot. Senses pain in left leg and arm. LUE 0 to trace. LLE 0-1 on MMT Skin: Skin is warm and dry.    Assessment/Plan: 1. 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 Physiatrist is providing close team supervision and 24 hour management of active medical problems listed below. Physiatrist and rehab team continue to assess barriers to discharge/monitor patient progress toward functional and medical goals. FIM: FIM - Bathing Bathing Steps Patient Completed: Front perineal area;Buttocks;Right upper leg;Left upper leg;Right lower leg (including foot);Left lower leg (including foot) Bathing: 1: Total-Patient completes 0-2 of 10 parts or less than 25%  FIM - Upper Body Dressing/Undressing Upper body dressing/undressing: 0: Wears gown/pajamas-no public clothing FIM - Lower Body Dressing/Undressing Lower body dressing/undressing steps patient completed: Don/Doff right sock;Don/Doff left sock Lower body dressing/undressing: 1: Total-Patient completed less than 25% of tasks     FIM - Archivist Transfers: 0-Activity did not occur  FIM - Banker Devices: Sliding board Bed/Chair Transfer: 1: Mechanical lift  FIM - Locomotion: Wheelchair Locomotion: Wheelchair: 1: Total Assistance/staff pushes wheelchair (Pt<25%) FIM - Locomotion: Ambulation Locomotion: Ambulation: 0: Activity did not occur (unsafe)  Comprehension Comprehension Mode: Auditory Comprehension: 3-Understands basic 50 - 74% of the time/requires cueing 25 - 50%  of the time  Expression Expression Mode: Verbal Expression Assistive Devices: 6-Talk trach valve Expression: 3-Expresses basic 50 - 74%  of the time/requires cueing 25 - 50% of the time.  Needs to repeat parts of sentences.  Social Interaction Social Interaction: 3-Interacts appropriately 50 - 74% of the time - May be physically or verbally inappropriate.  Problem Solving Problem Solving: 2-Solves basic 25 - 49% of the time - needs direction more than half the time to initiate, plan or complete simple activities  Memory Memory: 1-Recognizes or recalls less than 25% of the time/requires cueing greater than 75% of the time  Medical Problem List and Plan:  1. DVT Prophylaxis/Anticoagulation: Pharmaceutical: Lovenox  2. Pain Management:   prn baclofen for spasticity and voltaren gel for left knee pain.  3. Mood: No signs of distress noted.  4. Neuropsych: This patient is is not capable of making decisions on his/her own behalf.  5. HTN: Monitor with bid checks. continue metoprolol bid. Improvement overall 6. ABLA:  .   iron supplement if tolerating new diet without recurrent N/V  -labs improving 7. Tone:  baclofen trial. PRAFO for LLW 8. Pulmonary:trach downsized. Now out, and he has ben sating well. Close observation for now  -occlusive dressing over trach.  LOS (Days) 4 A FACE TO FACE EVALUATION WAS PERFORMED  Jeremy [name] T 10/26/2011, 7:20 AM

## 2011-10-26 NOTE — Patient Care Conference (Signed)
Inpatient RehabilitationTeam Conference Note Date: 10/26/2011   Time: 3:00 PM    Patient Name: Jeremy [name]      Medical Record Number: [id number]  Date of Birth: 02-28-1965 Sex: Male         Room/Bed: 4038/4038-01 Payor Info: Payor: BLUE CROSS BLUE [employer]IELD  Plan: [facility] PPO  Product Type: *No Product type*     Admitting Diagnosis: ICH,TRACH  Admit Date/Time:  10/22/2011  4:28 PM Admission Comments: No comment available   Primary Diagnosis:  <principal problem not specified> Principal Problem: <principal problem not specified>  Patient Active Problem List   Diagnosis Date Noted  . 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 Dysphagia, PEG dependent 10/20/2011  . Fever 10/16/2011  . Cytotoxic cerebral edema 10/02/2011  . Obstructive hydrocephalus 10/02/2011  . ICH (intracerebral hemorrhage) 10/01/2011  . HTN (hypertension) 10/01/2011    Expected Discharge Date: Expected Discharge Date: 11/19/11  Team Members Present: Physician: Dr. Faith Rogue Social Worker Present: Amada Jupiter, LCSW Nurse Present: Carmie End, RN PT Present: Reggy Eye, PT OT Present: Roney Mans, OT;Ardis Rowan, COTA SLP Present: Feliberto Gottron, SLP Other (Discipline and Name): Tora Duck, PPS      Current Status/Progress Goal Weekly Team Focus  Medical   right thalamic hemorrhage, left hemiparesis with spasticity.  SPASTICITY CONTROL, safety, nutrition  mgt of trach and trach site/pulmonary issues   Bowel/Bladder   incontinent bowel/bladder, wears brief during day, condom cath at night, lbm 9/9  continent with timed toileting  encourage timed toileting and patient calling for assistance with bowel/bladder    Swallow/Nutrition/ Hydration   Regular textures, thin liquids, Min A for utilization of swallowing compensatory strategies   supervision  increased utilization of swallowing compensatory strategies    ADL's   total assistance +2, static sitting balance improving  minimal assistance   postural control   Mobility   total A + 2, maximove for transfer, in tilt and space so unable to try to propel, min A static sitting balance, c/o dizziness  min-mod A except S w/c propulsion  sitting balance and initiation to assist with bed mobility and transfers   Communication   Mod A  supervision-Min A  increased vocal intensity, expression of wants/needs   Safety/Cognition/ Behavioral Observations  Mod-Max A  Min A  attention, awareness, problem solving, working memory    Pain   no complaints of pain   no complaints of pain   monitor patient for new onset of pain, verbal and nonverbal indicators    Skin   scalp incision healed and open to air, small open area inside buttocks, epbc cream applied regularly  no new breakdown  monitor skin for breakdown, use protective skin care products and turn q2hours and prn     Rehab Goals Patient on target to meet rehab goals: Yes *See Interdisciplinary Assessment and Plan and progress notes for long and short-term goals  Barriers to Discharge: profound neurological deficits    Possible Resolutions to Barriers:  family ed, NMR, adaptive equipment    Discharge Planning/Teaching Needs:  Home with wife who can provide 24/7 assistance -       Team Discussion:  Trach  Out and managing well.  No language issues - all related to decreased cognition.  Good po intake and not having to use peg.   Revisions to Treatment Plan:  None   Continued Need for Acute Rehabilitation Level of Care: The patient requires daily medical management by a physician with specialized training in physical medicine and  rehabilitation for the following conditions: Daily direction of a multidisciplinary physical rehabilitation program to ensure safe treatment while eliciting the highest outcome that is of practical value to the patient.: Yes Daily medical management of patient stability for increased activity during participation in an intensive rehabilitation regime.:  Yes Daily analysis of laboratory values and/or radiology reports with any subsequent need for medication adjustment of medical intervention for : Post surgical problems;Pulmonary problems;Neurological problems;Other  Tanganika Barradas 10/26/2011, 5:56 PM

## 2011-10-26 NOTE — Progress Notes (Signed)
Physical Therapy Session Note  Patient Details  Name: Jeremy [name] MRN: [id number] Date of Birth: 1966-01-01  Today's Date: 10/26/2011 Time: 0800-0842 Time Calculation (min): 42 min  Short Term Goals: Week 1:  PT Short Term Goal 1 (Week 1): Patient will perform bed mobility with max A and max verbal cues for initiation and sequencing PT Short Term Goal 2 (Week 1): Patient will tolerate unuspported sitting for balance and postural control training x 10 minutes with mod A overall PT Short Term Goal 3 (Week 1): Patient will begin w/c mobility training in appropriate w/c x 25' with max A and max verbal/visual/tactile cues for sequencing and initiation. PT Short Term Goal 4 (Week 1): Patient will tolerate standing in lift equipment total A x 5 minutes during functional acitivity to focus on attention to L side  Skilled Therapeutic Interventions/Progress Updates:  Patient worked on rolling side to side using bed rails. Patient is total assist. He does help by reaching for bed rail when rolling to left. Patient max assist for right side lying to sit. Patient able to maintain sitting edge of bed using right upper extremity with close supervision. Patient sat EOB to eat breakfast. He tends to fall backwards when not using right upper extremity for support and then requires mod assist for sitting balance. Patient sat EOB for a total of 30 minutes.   Therapy Documentation Precautions:  Precautions Precautions: Fall Precaution Comments: trach has been removed. On canula O2. Sats stayed in the 90's throughout the session Restrictions Weight Bearing Restrictions: No  Pain: Pain Assessment Pain Assessment: No/denies pain Patient reported some discomfort in left knee. Unable to describe fully.  See FIM for current functional status  Therapy/Group: Individual Therapy  Arelia Longest M 10/26/2011, 9:06 AM

## 2011-10-26 NOTE — Progress Notes (Signed)
Occupational Therapy Session Note  Patient Details  Name: Jeremy [name] MRN: [id number] Date of Birth: February 10, 1966  Today's Date: 10/26/2011 Time: 0830-0930 Time Calculation (min): 60 min  Short Term Goals: Week 1:  OT Short Term Goal 1 (Week 1): Pt. will sit with midline control unsupported for 3 mins duriing bathing OT Short Term Goal 2 (Week 1): Pt. will bathe left UE with mod assist. OT Short Term Goal 3 (Week 1): Pt. will dress UB with moderate assist.   OT Short Term Goal 4 (Week 1): Pt. will leteral lean with max assist  Skilled Therapeutic Interventions/Progress Updates: Patient participated in 1:1 OT session to address postural control and static sitting balance during basic self care tasks.  PT present for first few minutes of session, indicates patient is showing significant improvement in his ability to attend to and maintain static sitting position, although when functionally using his right hand, e.g. Feeding, has a tendency to fall backward and to left.  Patient seems very pleased with improved performance / report.  Patient required 2+ assistance to transition from sit to stand from slightly elevated bed.  Patient at first unable to activate any muscle activity in right leg to stand, and expressing fear of falling.  After multiple attempts, patient able to assist with transition to near stand, to allow diaper to be removed and reapplied, and buttocks to be cleaned.       Therapy Documentation Precautions:  Fall, left hemiplegia Pain: Left leg aching See FIM for current functional status  Therapy/Group: Individual Therapy  Collier Salina 10/26/2011, 11:16 AM

## 2011-10-26 NOTE — Progress Notes (Signed)
Physical Therapy Session Note  Patient Details  Name: Jeremy [name] MRN: [id number] Date of Birth: 02-24-1965  Today's Date: 10/26/2011 Time: 1445-1550 Time Calculation (min): 65 min  Short Term Goals: Week 1:  PT Short Term Goal 1 (Week 1): Patient will perform bed mobility with max A and max verbal cues for initiation and sequencing PT Short Term Goal 1 - Progress (Week 1): Progressing toward goal PT Short Term Goal 2 (Week 1): Patient will tolerate unuspported sitting for balance and postural control training x 10 minutes with mod A overall PT Short Term Goal 2 - Progress (Week 1): Progressing toward goal PT Short Term Goal 3 (Week 1): Patient will begin w/c mobility training in appropriate w/c x 25' with max A and max verbal/visual/tactile cues for sequencing and initiation. PT Short Term Goal 4 (Week 1): Patient will tolerate standing in lift equipment total A x 5 minutes during functional acitivity to focus on attention to L side  Skilled Therapeutic Interventions/Progress Updates:    NMR- worked on rolling with normal movement patterns with pt learning sequence and cueing PT how to A by managing LLE, max A to R and L; supine to sit to L with max A but total A sit to supine Postural control and balance work seated EOB 15 minutes with increased lean L with fatigue and increased c/o dizziness, rested RUE elbow on table and fed himself a drink with straw without LOB close S to min A overall static balance with cues for midline  Attempted beazy transfer but unsuccessful used lift OOB to w/c trying to involve wife  Wife shown and verbalized understanding of how to tilt w/c for pressure relief. O2 sats 96% HR 90  Pain- pt with c/o pain with passive stretch to hip ER and reports he broke R foot 10 yrs ago and it hurts if it gets cold, used bengay and would go to bed when this occurred at home  Pt eyes seemed to jump and once upright he did have frequent complaints of dizziness that were  not better by focusing on near or far object and he could not keep eyes closed; he could not state what else made this better or worse, will monitor more- he states vision is blurry but is unaware of a field cut although chart reads upper fields impaired  Therapy Documentation Precautions:  Precautions Precautions: Fall Precaution Comments: trach collar with passy muir valve; continuous monitor of sats Restrictions Weight Bearing Restrictions: No See FIM for current functional status  Therapy/Group: Individual Therapy  Michaelene Song 10/26/2011, 4:08 PM

## 2011-10-27 ENCOUNTER — Inpatient Hospital Stay (HOSPITAL_COMMUNITY): Payer: BC Managed Care – PPO | Admitting: Occupational Therapy

## 2011-10-27 ENCOUNTER — Inpatient Hospital Stay (HOSPITAL_COMMUNITY): Payer: BC Managed Care – PPO

## 2011-10-27 ENCOUNTER — Inpatient Hospital Stay (HOSPITAL_COMMUNITY): Payer: BC Managed Care – PPO | Admitting: Speech Pathology

## 2011-10-27 DIAGNOSIS — Z5189 Encounter for other specified aftercare: Secondary | ICD-10-CM

## 2011-10-27 DIAGNOSIS — I619 Nontraumatic intracerebral hemorrhage, unspecified: Secondary | ICD-10-CM

## 2011-10-27 DIAGNOSIS — J9600 Acute respiratory failure, unspecified whether with hypoxia or hypercapnia: Secondary | ICD-10-CM

## 2011-10-27 DIAGNOSIS — I10 Essential (primary) hypertension: Secondary | ICD-10-CM

## 2011-10-27 MED ORDER — BACLOFEN 10 MG PO TABS
10.0000 mg | ORAL_TABLET | Freq: Three times a day (TID) | ORAL | Status: DC
  Administered 2011-10-27 – 2011-11-01 (×16): 10 mg via ORAL
  Filled 2011-10-27 (×20): qty 1

## 2011-10-27 MED ORDER — METOPROLOL TARTRATE 25 MG/10 ML ORAL SUSPENSION
25.0000 mg | Freq: Two times a day (BID) | ORAL | Status: DC
  Administered 2011-10-27 – 2011-11-01 (×10): 25 mg via ORAL
  Filled 2011-10-27 (×13): qty 10

## 2011-10-27 NOTE — Progress Notes (Signed)
Subjective/Complaints: Sitting comfortably in bed. A 12 point review of systems has been performed and if not noted above is otherwise negative.   Objective: Vital Signs: Blood pressure 155/82, pulse 87, temperature 98.6 F (37 C), temperature source Oral, resp. rate 18, SpO2 98.00%. No results found.  Basename 10/25/11 1115  WBC 5.6  HGB 10.2*  HCT 32.0*  PLT 410*    Basename 10/25/11 1115  NA 136  K 3.8  CL 100  CO2 28  GLUCOSE 102*  BUN 17  CREATININE 0.84  CALCIUM 9.9   CBG (last 3)  No results found for this basename: GLUCAP:3 in the last 72 hours  Wt Readings from Last 3 Encounters:  10/18/11 121.1 kg (266 lb 15.6 oz)  10/18/11 121.1 kg (266 lb 15.6 oz)    Physical Exam:  Nursing note and vitals reviewed.  Constitutional: He is oriented to person, place, and time. He appears well-developed and well-nourished.  Morbidly obese male with staples right scalp and tracheostomy. #6 trach  HENT:  Head: Normocephalic and atraumatic.  Tongue with greenish white coating. Poor dentition. Missing multiple teeth  Eyes: Pupils are equal, round, and reactive to light.  Unable to close left eye. Dysconjugate gaze, exopthalmus. Cannot gaze upward Neck:  Trach out. Wound dressed. Actually phonating well Cardiovascular: Normal rate and regular rhythm.  Pulmonary/Chest: Effort normal.  Bilateral rhonchi decreased. No distress. No cough Abdominal: Soft. Bowel sounds are normal. He exhibits no distension. There is no tenderness.  PEG site clean and dry.  Musculoskeletal: He exhibits tenderness (left knee with ROM.).  Neurological: He is alert and oriented to person, place, and time. He displays abnormal reflex.  Able to state DOB, current month but not the date. Polite and appropriate. Follows basic commands without difficulty. Speech quality later. Left facial weakness. Unable to look upward. Dense left hemiparesis with flexor tone at knee. sustained clonus left foot. Senses  pain in left leg and arm. LUE   trace. LLE 0-1 on MMT Skin: Skin is warm and dry.    Assessment/Plan: 1. 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 Physiatrist is providing close team supervision and 24 hour management of active medical problems listed below. Physiatrist and rehab team continue to assess barriers to discharge/monitor patient progress toward functional and medical goals. FIM: FIM - Bathing Bathing Steps Patient Completed: Chest;Abdomen;Right upper leg Bathing: 1: Two helpers (Stand for buttocks)  FIM - Upper Body Dressing/Undressing Upper body dressing/undressing: 0: Wears gown/pajamas-no public clothing FIM - Lower Body Dressing/Undressing Lower body dressing/undressing steps patient completed: Don/Doff right sock;Don/Doff left sock Lower body dressing/undressing: 0: Wears gown/pajamas-no public clothing  FIM - Toileting Toileting: 0: Activity did not occur  FIM - Archivist Transfers: 0-Activity did not occur  FIM - Banker Devices: Sliding board Bed/Chair Transfer: 1: Mechanical lift;1: Two helpers  FIM - Locomotion: Wheelchair Locomotion: Wheelchair: 1: Total Assistance/staff pushes wheelchair (Pt<25%) FIM - Locomotion: Ambulation Locomotion: Ambulation: 0: Activity did not occur  Comprehension Comprehension Mode: Auditory Comprehension: 3-Understands basic 50 - 74% of the time/requires cueing 25 - 50%  of the time  Expression Expression Mode: Verbal Expression Assistive Devices: 6-Talk trach valve Expression: 3-Expresses basic 50 - 74% of the time/requires cueing 25 - 50% of the time. Needs to repeat parts of sentences.  Social Interaction Social Interaction: 3-Interacts appropriately 50 - 74%  of the time - May be physically or  verbally inappropriate.  Problem Solving Problem Solving: 1-Solves basic less than 25% of the time - needs direction nearly all the time or does not effectively solve problems and may need a restraint for safety  Memory Memory: 1-Recognizes or recalls less than 25% of the time/requires cueing greater than 75% of the time  Medical Problem List and Plan:  1. DVT Prophylaxis/Anticoagulation: Pharmaceutical: Lovenox  2. Pain Management:    baclofen for spasticity and voltaren gel for left knee pain.  3. Mood: No signs of distress noted.  4. Neuropsych: This patient is is not capable of making decisions on his/her own behalf.  5. HTN: Monitor with bid checks. continue metoprolol bid. Improvement overall 6. ABLA:  .   iron supplement if tolerating new diet without recurrent N/V  -labs improving 7. Tone:  baclofen trial has helped. PRAFO for LLW 8. Pulmonary:doing well s/p self-decannulation  -needs to keep dressing on LOS (Days) 5 A FACE TO FACE EVALUATION WAS PERFORMED  SWARTZ,ZACHARY T 10/27/2011, 8:27 AM

## 2011-10-27 NOTE — Progress Notes (Signed)
Physical Therapy Session Note  Patient Details  Name: Jeremy [name] MRN: [id number] Date of Birth: 04-16-1965  Today's Date: 10/27/2011 Time: 1400-1445 Time Calculation (min): 45 min  Short Term Goals: Week 1:  PT Short Term Goal 1 (Week 1): Patient will perform bed mobility with max A and max verbal cues for initiation and sequencing PT Short Term Goal 1 - Progress (Week 1): Progressing toward goal PT Short Term Goal 2 (Week 1): Patient will tolerate unuspported sitting for balance and postural control training x 10 minutes with mod A overall PT Short Term Goal 2 - Progress (Week 1): Progressing toward goal PT Short Term Goal 3 (Week 1): Patient will begin w/c mobility training in appropriate w/c x 25' with max A and max verbal/visual/tactile cues for sequencing and initiation. PT Short Term Goal 4 (Week 1): Patient will tolerate standing in lift equipment total A x 5 minutes during functional acitivity to focus on attention to L side  Skilled Therapeutic Interventions/Progress Updates:  Patient max assist to roll to right and move from side lying to sit using bed rails. Patient +2 total assist using beezy board to transfer to wheelchair. Patient +3 to remove beezy board once in wheelchair. Therapist used mechanical lift to transfer patient to mat. Patient worked on sitting balance with dynamic activity of reaching and horseshoes. Patient can maintain sitting balance with occasional minimal assistance for LOB to left and back. Patient returned to wheelchair using lift.   Therapy Documentation Precautions:  Precautions Precautions: Fall Restrictions Weight Bearing Restrictions: No  Pain: Pain Assessment Pain Assessment: No/denies pain  See FIM for current functional status  Therapy/Group: Individual Therapy  [name] 10/27/2011, 5:25 PM

## 2011-10-27 NOTE — Progress Notes (Signed)
Speech Language Pathology Daily Session Note  Patient Details  Name: Jeremy [name] MRN: [id number] Date of Birth: 1965-07-21  Today's Date: 10/27/2011 Time: 4403-4742 Time Calculation (min): 45 min  Short Term Goals: Week 1: SLP Short Term Goal 1 (Week 1): Pt will utilize increased vocal intensity at the phrase level with Min verbal cues.  SLP Short Term Goal 2 (Week 1): Pt will orient to place, time and situation with supervision contextual and enviornmental cues.  SLP Short Term Goal 3 (Week 1): Pt will sustain attention to functional task for ~10 minutes with Mod A verbal cues for redirection  SLP Short Term Goal 4 (Week 1): Pt will attend to left field of enviornments/body during functional tasks with Mod A verbal and semantic cues.  SLP Short Term Goal 5 (Week 1): Pt will initiate functional tasks with Min A verbal cues.  SLP Short Term Goal 6 (Week 1): Pt will demonstrate functional problem solving for functional and familair tasks with Mod A verbal cues.   Skilled Therapeutic Interventions: Treatment focus on cognitive goals. Pt asleep in bed and required max verbal and tactile cues for arousal. Pt reported pain in his right ankle and required Max question cues to verbally problem solve how to receive pain medications. Pt also required Max verbal cues to utilize increase vocal intensity to increase speech intelligibility at the phrase level. Pt oriented to time and situation but required Mod question cues to orient to place (city). Pt recalled morning events with max A question and semantic cues.    FIM:  Comprehension Comprehension Mode: Auditory Comprehension: 2-Understands basic 25 - 49% of the time/requires cueing 51 - 75% of the time Expression Expression Mode: Verbal Expression: 2-Expresses basic 25 - 49% of the time/requires cueing 50 - 75% of the time. Uses single words/gestures. Social Interaction Social Interaction: 3-Interacts appropriately 50 - 74% of the time - May be  physically or verbally inappropriate. Problem Solving Problem Solving: 1-Solves basic less than 25% of the time - needs direction nearly all the time or does not effectively solve problems and may need a restraint for safety Memory Memory: 1-Recognizes or recalls less than 25% of the time/requires cueing greater than 75% of the time FIM - Eating Eating Activity: 5: Supervision/cues  Pain 5/10 in Right Ankle, RN notified and medications given   Therapy/Group: Individual Therapy  Numa Heatwole 10/27/2011, 4:29 PM

## 2011-10-27 NOTE — Progress Notes (Signed)
Social Work  Social Work Assessment and Plan  Patient Details  Name: Jeremy [name] MRN: [id number] Date of Birth: 1965/05/31  Today's Date: 10/27/2011  Problem List:  Patient Active Problem List  Diagnosis  . ICH (intracerebral hemorrhage)  . HTN (hypertension)  . Cytotoxic cerebral edema  . Obstructive hydrocephalus  . Fever  . Tracheostomy dependent  . Dysphagia, PEG dependent   Past Medical History:  Past Medical History  Diagnosis Date  . Hypertension 10/01/2011    basal ganglia  . Stroke    Past Surgical History:  Past Surgical History  Procedure Date  . Tracheostomy tube placement 10/11/2011    Procedure: TRACHEOSTOMY;  Surgeon: Christia Reading, MD;  Location: [facility] OR;  Service: ENT;  Laterality: N/A;   Social History:  reports that he has never smoked. He does not have any smokeless tobacco history on file. He reports that he does not drink alcohol or use illicit drugs.  Family / Support Systems Marital Status: Married How Long?: 14 years Patient Roles: Spouse;Parent;Other (Comment) (employee) Spouse/Significant Other: wife, Milind Raether @ [phone] or [phone] Children: wife has one adult daughter - lives locally and is supportive, however, has 5 young children Other Supports: pt and wife have a few siblings living in the area who are supportive Anticipated Caregiver: wife Ability/Limitations of Caregiver: min assist. transportation public bus system Caregiver Availability: 24/7 Family Dynamics: Wife reports that she and pt have a stable marriage - denies any issues/ strains at this time.  Pt has parents and siblings in the area who are supportive, however, she hints that there may be some strain between herself and pt's parents.  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  Feels he is making some progress with therapies.  Denies any s/s of emotional distress - depression or anxiety, however, will monitor as cognition improves.   Recent Psychosocial Issues: Wife reports that they have been under some financial strain which has now worsenend with this health crisis.   Pyschiatric History: None per wife Substance Abuse History: None  Patient / Family Perceptions, Expectations & Goals Pt/Family understanding of illness & functional limitations: Pt with very limited understanding of his medical issues and deficits.  Wife with slightly better understanding of medical issues and anticipated longer term care needs.   Premorbid pt/family roles/activities: Pt was sole wage Network engineer for household.   Anticipated changes in roles/activities/participation: Wife will need to assume full-time caregiving role for patient and of household and financial management.   Pt/family expectations/goals: Wife admits she is uncertain what to anticipate for pt's functional gains.  Pt states, "just want to go home"  [employer]: None Premorbid Home Care/DME Agencies: None Transportation available at discharge: SCAT  - wife does not drive - usually gets rides from family or uses public transportation. Resource referrals recommended: Neuropsychology;Support group (specify);Advocacy groups;Other (Comment) (DSS programs:   Emergency  Assistance, Sales executive)  Discharge Planning Living Arrangements: Spouse/significant other Support Systems: Spouse/significant other;Children;Parent;Other relatives Type of Residence: Private residence Insurance Resources: Media planner (specify) Chief Executive Officer) Financial Resources: Employment Financial Screen Referred: No Living Expenses: Rent Money Management: Patient Do you have any problems obtaining your medications?: No Home Management: wife managed PTA Patient/Family Preliminary Plans: Wife plans for pt to d/c home with her as primary caregiver Barriers to Discharge: Finances Social Work Anticipated Follow Up Needs: HH/OP;Support Group Expected length of stay: 4 weeks  Clinical Impression Unfortunate gentleman here after ICH and with significant physical and cognitive deficits.  Wife very supportive, however, currently under much stress due to financial issues (i.e. Facing possible eviction).  Wife denying any concerns about managing pt's care at home.  Much education will be needed ongoing.  Zahira Brummond 10/27/2011, 11:22 AM

## 2011-10-27 NOTE — Progress Notes (Signed)
Occupational Therapy Session Note  Patient Details  Name: Jeremy [name] MRN: [id number] Date of Birth: 01/25/1966  Today's Date: 10/27/2011 Time: 1000-1100 Time Calculation (min): 60 min  Short Term Goals: Week 1:  OT Short Term Goal 1 (Week 1): Pt. will sit with midline control unsupported for 3 mins duriing bathing OT Short Term Goal 2 (Week 1): Pt. will bathe left UE with mod assist. OT Short Term Goal 3 (Week 1): Pt. will dress UB with moderate assist.   OT Short Term Goal 4 (Week 1): Pt. will leteral lean with max assist  Skilled Therapeutic Interventions/Progress Updates: Patient seen for 1:1 OT session this am to address bathing and dressing and functional mobility with level surface transfer.  Patient very distracted, asking to shave.  Patient very focused on using the lift for transfers, as he has overheard others talking about lift equipment to move him.  Patient assisted to edge o f bed with total assist.  Patient able to static sit at edge of bed with minimal assistance once positioned correctly.  Dynamic sitting requires mod assist.  Patient transferred via beazy board to wheelchair.  Patient unable to assist with right arm to push self into the chair, rather patient continued to pull in opposite direction on bed rail.  Patient unable to utilize verbal cueing to remedy this situation.  Patient was allowed to shave with safety razor, and then was able to sustain attention for several minutes, (meaningful task.)       Therapy Documentation Precautions:  Precautions Precautions: Fall Precaution Comments: trach collar with passy muir valve; continuous monitor of sats Restrictions Weight Bearing Restrictions: No   Pain: Pain Assessment Pain Assessment: 0-10 Pain Score:   5 Pain Type: Chronic pain Pain Location: Knee Pain Orientation: Left Pain Descriptors: Throbbing Pain Frequency: Occasional Pain Onset: With Activity Patients Stated Pain Goal: 3 Pain Intervention(s):  Medication (See eMAR) Multiple Pain Sites: No  See FIM for current functional status  Therapy/Group: Individual Therapy  Collier Salina 10/27/2011, 12:53 PM

## 2011-10-27 NOTE — Progress Notes (Signed)
Speech Language Pathology Daily Session Note  Patient Details  Name: Jeremy [name] MRN: [id number] Date of Birth: 09-17-1965  Today's Date: 10/27/2011 Time: 4098-1191 Time Calculation (min): 20 min  Short Term Goals: Week 1: SLP Short Term Goal 1 (Week 1): Pt will utilize increased vocal intensity at the phrase level with Min verbal cues.  SLP Short Term Goal 2 (Week 1): Pt will orient to place, time and situation with supervision contextual and enviornmental cues.  SLP Short Term Goal 3 (Week 1): Pt will sustain attention to functional task for ~10 minutes with Mod A verbal cues for redirection  SLP Short Term Goal 4 (Week 1): Pt will attend to left field of enviornments/body during functional tasks with Mod A verbal and semantic cues.  SLP Short Term Goal 5 (Week 1): Pt will initiate functional tasks with Min A verbal cues.  SLP Short Term Goal 6 (Week 1): Pt will demonstrate functional problem solving for functional and familair tasks with Mod A verbal cues.   Skilled Therapeutic Interventions: Co-treatment group session; SLP facilitated session with min assist verbal cues to initiatlly recall safe swallow compensatory strategeis.  Patient then required supervision level vebral cues to utilize during self-feeding and mod assit verbal cues to monitor and compensate for left anterior loss of bolus.  Patient required no cues to select attention during task.    FIM:  Comprehension Comprehension: 2-Understands basic 25 - 49% of the time/requires cueing 51 - 75% of the time Expression Expression Mode: Verbal Expression: 2-Expresses basic 25 - 49% of the time/requires cueing 50 - 75% of the time. Uses single words/gestures. Social Interaction Social Interaction: 3-Interacts appropriately 50 - 74% of the time - May be physically or verbally inappropriate. Problem Solving Problem Solving: 1-Solves basic less than 25% of the time - needs direction nearly all the time or does not effectively  solve problems and may need a restraint for safety Memory Memory: 1-Recognizes or recalls less than 25% of the time/requires cueing greater than 75% of the time FIM - Eating Eating Activity: 5: Supervision/cues  Pain Pain Assessment Pain Assessment: No/denies pain  Therapy/Group: Group Therapy  Charlane Ferretti., CCC-SLP [phone]  Jeremy [name] 10/27/2011, 1:12 PM

## 2011-10-27 NOTE — Progress Notes (Signed)
Occupational Therapy Session Note  Patient Details  Name: Blayton Fare MRN: [id number] Date of Birth: 02-11-1966  Today's Date: 10/27/2011 Time: 1130-1145 Time Calculation (min): 15 min  Short Term Goals: Week 1:  OT Short Term Goal 1 (Week 1): Pt. will sit with midline control unsupported for 3 mins duriing bathing OT Short Term Goal 2 (Week 1): Pt. will bathe left UE with mod assist. OT Short Term Goal 3 (Week 1): Pt. will dress UB with moderate assist.   OT Short Term Goal 4 (Week 1): Pt. will leteral lean with max assist  Skilled Therapeutic Interventions/Progress Updates:    Self feeding in group format.  Working on maintaining selective attention and following his precautions.  Integrated the LUE with max facilitation to hold his drink and bring to mouth.  Noted slight gross digit flexion.  Also integrated scanning to the left side to locate his drink as well.  Therapy Documentation Precautions:  Precautions Precautions: Fall Precaution Comments: trach collar with passy muir valve; continuous monitor of sats Restrictions Weight Bearing Restrictions: No  Pain: Pain Assessment Pain Assessment: 0-10 Pain Score:   5 Pain Type: Chronic pain Pain Location: Knee Pain Orientation: Left Pain Descriptors: Throbbing Pain Frequency: Occasional Pain Onset: With Activity Patients Stated Pain Goal: 3 Pain Intervention(s): Medication (See eMAR) Multiple Pain Sites: No ADL: See FIM for current functional status  Therapy/Group: Group Therapy  Delphin Funes OTR/L 10/27/2011, 1:04 PM

## 2011-10-27 NOTE — Consult Note (Signed)
WOC consult Note Reason for Consult: eval non healing tracheostomy site.  Janina Mayo out over last few days. Wound type: ostomy site Measurement: 1.5cm x 1.0cm x 0.5cm Wound bed: 100% pale and non granular, with some yellow slough present Drainage (amount, consistency, odor) serous drainage Periwound:intact with some maceration of the wound edges Dressing procedure/placement/frequency: will order silver hydrofiber to be packed into site, cover with silicone foam dressing.  Discussed with bedside nurse, supplies ordered per unit secretary. Bedside nurse will change dressing once supplies to the bedside.  Re consult if needed, will not follow at this time. Thanks  Ahmyah Gidley [employer], CWOCN [phone]) Conservative

## 2011-10-28 ENCOUNTER — Inpatient Hospital Stay (HOSPITAL_COMMUNITY): Payer: BC Managed Care – PPO

## 2011-10-28 ENCOUNTER — Inpatient Hospital Stay (HOSPITAL_COMMUNITY): Payer: BC Managed Care – PPO | Admitting: Occupational Therapy

## 2011-10-28 ENCOUNTER — Inpatient Hospital Stay (HOSPITAL_COMMUNITY): Payer: BC Managed Care – PPO | Admitting: Speech Pathology

## 2011-10-28 MED ORDER — BIOTENE DRY MOUTH MT LIQD
15.0000 mL | Freq: Three times a day (TID) | OROMUCOSAL | Status: DC
  Administered 2011-10-28 – 2011-11-16 (×57): 15 mL via OROMUCOSAL

## 2011-10-28 NOTE — Plan of Care (Signed)
Problem: RH BOWEL ELIMINATION Goal: RH STG MANAGE BOWEL W/EQUIPMENT W/ASSISTANCE STG Manage Bowel With Equipment With mod Assistance  Outcome: Not Applicable Date Met:  10/28/11 Not using bedside commode at this time due to mobility. Will attempt to use lift to transfer .

## 2011-10-28 NOTE — Progress Notes (Signed)
Social Work  Patient ID: Jeremy [name], male   DOB: August 22, 1965, 46 y.o.   MRN: [id number]  Met with patient and spoke with his wife via phone to review team conference information.  Pt with cognitive impairments which impede ability to appreciate this information.  Wife aware and agreeable with targeted d/c date of 10/4 at minimal assistance w/c lever goals.  Wife is continuing to work with landlord on getting rent paid to avoid eviction.  Continue to follow.  Vikrant Pryce

## 2011-10-28 NOTE — Progress Notes (Signed)
Occupational Therapy Weekly Progress Note  Patient Details  Name: Philopater Mucha MRN: [id number] Date of Birth: 1965/06/08  Today's Date: 10/28/2011  Patient has met 4 of 4 short term goals, due to improved trunk control in sitting, improved activity tolerance, and decreased fear of forward weight shift in sitting.  Patient continues to demonstrate the following deficits: dense left hemiplegia, lower extremity pain, and significant cognitive deficits (attention, initiation, awareness) and therefore will continue to benefit from skilled OT intervention to enhance overall performance with BADL.  Patient progressing toward long term goals..  Continue plan of care.  OT Short Term Goals Week 1:  OT Short Term Goal 1 (Week 1): Pt. will sit with midline control unsupported for 3 mins duriing bathing OT Short Term Goal 1 - Progress (Week 1): Met OT Short Term Goal 2 (Week 1): Pt. will bathe left UE with mod assist. OT Short Term Goal 2 - Progress (Week 1): Met OT Short Term Goal 3 (Week 1): Pt. will dress UB with moderate assist.   OT Short Term Goal 3 - Progress (Week 1): Progressing toward goal OT Short Term Goal 4 (Week 1): Pt. will leteral lean with max assist OT Short Term Goal 4 - Progress (Week 1): Met Week 2:  OT Short Term Goal 1 (Week 2): Patient will transfer weight forward in sitting with mod assist in preparation for sit to stand OT Short Term Goal 2 (Week 2): Patient will utilize grab bar effectively to transition from sit to squat for LB bathing with max assist OT Short Term Goal 3 (Week 2): Patient will sustain attention to grooming or meal task for more than 5 min with min prompting OT Short Term Goal 4 (Week 2): Patient will effectively utilize right arm and leg to roll to left with min assist to aide with bathing    Therapy Documentation Precautions:  Precautions Precautions: Fall Precaution Comments: trach collar with passy muir valve; continuous monitor of  sats Restrictions Weight Bearing Restrictions: No  See FIM for current functional status  Therapy/Group: Individual Therapy  Collier Salina 10/28/2011, 12:02 PM

## 2011-10-28 NOTE — Progress Notes (Signed)
Occupational Therapy Session Note  Patient Details  Name: Jeremy [name] MRN: [id number] Date of Birth: 1965-10-29  Today's Date: 10/28/2011 Time: 1130-1145 Time Calculation (min): 15 min  Short Term Goals: Week 1:  OT Short Term Goal 1 (Week 1): Pt. will sit with midline control unsupported for 3 mins duriing bathing OT Short Term Goal 1 - Progress (Week 1): Met OT Short Term Goal 2 (Week 1): Pt. will bathe left UE with mod assist. OT Short Term Goal 2 - Progress (Week 1): Met OT Short Term Goal 3 (Week 1): Pt. will dress UB with moderate assist.   OT Short Term Goal 3 - Progress (Week 1): Progressing toward goal OT Short Term Goal 4 (Week 1): Pt. will leteral lean with max assist OT Short Term Goal 4 - Progress (Week 1): Met  Skilled Therapeutic Interventions/Progress Updates:    Self feeding in group format. Focus on maintaining selective attention, following precautions & LUE, L side awareness while eating lunch today. Pt currently requires max facilitation to attend to and use LUE and to scan to the left as well as wipe L side of mouth.  Therapy Documentation Precautions:  Precautions Precautions: Fall Precaution Comments: trach collar with passy muir valve; continuous monitor of sats Restrictions Weight Bearing Restrictions: No   Vital Signs: Therapy Vitals BP: 150/88 mmHg Pain: Pain Assessment Pain Assessment: No/denies pain Pain Score:   2       See FIM for current functional status  Therapy/Group: Group Therapy  Charletta Cousin, Denya Buckingham Beth Dixon 10/28/2011, 12:19 PM

## 2011-10-28 NOTE — Progress Notes (Signed)
Occupational Therapy Session Note  Patient Details  Name: Jeremy [name] MRN: [id number] Date of Birth: 09-01-1965  Today's Date: 10/28/2011 Time: 0930-1030 Time Calculation (min): 60 min  Short Term Goals: Week 1:  OT Short Term Goal 1 (Week 1): Pt. will sit with midline control unsupported for 3 mins duriing bathing OT Short Term Goal 2 (Week 1): Pt. will bathe left UE with mod assist. OT Short Term Goal 3 (Week 1): Pt. will dress UB with moderate assist.   OT Short Term Goal 4 (Week 1): Pt. will leteral lean with max assist  Skilled Therapeutic Interventions/Progress Updates:    Patient participated in 1:1 OT session today to address focused to sustained attention, visual scanning, and functional mobility during self care tasks.  Patient had brief activation in left leg, and left shoulder this am, moving on command.  Patient has trace finger flexion/extension starting.  Cognitive deficits are impeding motor performance as well as true hemiplegia.  Patient practiced forward weight shift in sitting, working toward pelvic anterior motion with extension in trunk.  Patient able to recruit extension, but difficulty sustaining.  Worked to narrow patient's base of support in sitting to ease transition from sit to stand.  Stood x 3 with +2 assistance.  Transferred patient to bedside commode via sky lift when patient indicated need to void.  Patient attempted without success to complete hygiene without success.  Therapy Documentation Precautions:  Precautions Precautions: Fall Precaution Comments: trach collar with passy muir valve; continuous monitor of sats Restrictions Weight Bearing Restrictions: No Pain: Frequent reports of aching in left leg See FIM for current functional status  Therapy/Group: Individual Therapy  Collier Salina 10/28/2011, 10:53 AM

## 2011-10-28 NOTE — Progress Notes (Signed)
Speech Language Pathology Daily Session Note  Patient Details  Name: Jeremy [name] MRN: [id number] Date of Birth: Nov 16, 1965  Today's Date: 10/28/2011 Time: 4010-2725 Time Calculation (min): 45 min  Short Term Goals: Week 1: SLP Short Term Goal 1 (Week 1): Pt will utilize increased vocal intensity at the phrase level with Min verbal cues.  SLP Short Term Goal 2 (Week 1): Pt will orient to place, time and situation with supervision contextual and enviornmental cues.  SLP Short Term Goal 3 (Week 1): Pt will sustain attention to functional task for ~10 minutes with Mod A verbal cues for redirection  SLP Short Term Goal 4 (Week 1): Pt will attend to left field of enviornments/body during functional tasks with Mod A verbal and semantic cues.  SLP Short Term Goal 5 (Week 1): Pt will initiate functional tasks with Min A verbal cues.  SLP Short Term Goal 6 (Week 1): Pt will demonstrate functional problem solving for functional and familair tasks with Mod A verbal cues.   Skilled Therapeutic Interventions: Treatment focus on functional problem solving and working memory. Pt sequenced 4 step picture cards with Mod A verbal and semantic cues for problem solving with task. Pt also required Mod verbal and question cues to follow mildly complex directions and complete basic money management task (sorting and making change). Pt sustained attention to task in a mildly distracting environment for ~30 minutes with Mod verbal cues for redirection.    FIM:  Comprehension Comprehension Mode: Auditory Comprehension: 3-Understands basic 50 - 74% of the time/requires cueing 25 - 50%  of the time Expression Expression Mode: Verbal Expression: 2-Expresses basic 25 - 49% of the time/requires cueing 50 - 75% of the time. Uses single words/gestures. Social Interaction Social Interaction: 3-Interacts appropriately 50 - 74% of the time - May be physically or verbally inappropriate. Problem Solving Problem Solving:  3-Solves basic 50 - 74% of the time/requires cueing 25 - 49% of the time Memory Memory: 2-Recognizes or recalls 25 - 49% of the time/requires cueing 51 - 75% of the time FIM - Eating Eating Activity: 5: Set-up assist for open containers;5: Set-up assist for cut food  Pain Pain Assessment Pain Assessment: No/denies pain   Therapy/Group: Individual Therapy  Makarios Madlock 10/28/2011, 4:17 PM

## 2011-10-28 NOTE — Progress Notes (Signed)
Physical Therapy Session Note  Patient Details  Name: Jeremy [name] MRN: [id number] Date of Birth: 1965/06/10  Today's Date: 10/28/2011 Time: 4098-1191 Time Calculation (min): 50 min  Short Term Goals: Week 1:  PT Short Term Goal 1 (Week 1): Patient will perform bed mobility with max A and max verbal cues for initiation and sequencing PT Short Term Goal 1 - Progress (Week 1): Progressing toward goal PT Short Term Goal 2 (Week 1): Patient will tolerate unuspported sitting for balance and postural control training x 10 minutes with mod A overall PT Short Term Goal 2 - Progress (Week 1): Progressing toward goal PT Short Term Goal 3 (Week 1): Patient will begin w/c mobility training in appropriate w/c x 25' with max A and max verbal/visual/tactile cues for sequencing and initiation. PT Short Term Goal 4 (Week 1): Patient will tolerate standing in lift equipment total A x 5 minutes during functional acitivity to focus on attention to L side  Skilled Therapeutic Interventions/Progress Updates:  Patient incontinent of urine upon entering room and required +2 assist to change clothes and linen. Patient +2 to roll side to side. Patient total assist  Side lying to sit on edge of bed. Patient can maintain sitting edge of bed with close supervision. Therapists attempted sit to stand from bed x2 using [name]. Patient stood about 5 seconds before sinking into sling and needing to be lowered. Patient +2 sit to supine. Used maxi sky to transfer patient to wheelchair.   Therapy Documentation Precautions:  Precautions Precautions: Fall Weight Bearing Restrictions: No  Pain: Pain Assessment Pain Assessment: No/denies pain   See FIM for current functional status  Therapy/Group: Individual Therapy  [name] 10/28/2011, 9:37 AM

## 2011-10-28 NOTE — Progress Notes (Signed)
Subjective/Complaints: Seems to have rested fairly well. A 12 point review of systems has been performed and if not noted above is otherwise negative.   Objective: Vital Signs: Blood pressure 154/92, pulse 78, temperature 98.5 F (36.9 C), temperature source Oral, resp. rate 19, weight 116.529 kg (256 lb 14.4 oz), SpO2 100.00%. No results found.  Basename 10/25/11 1115  WBC 5.6  HGB 10.2*  HCT 32.0*  PLT 410*    Basename 10/25/11 1115  NA 136  K 3.8  CL 100  CO2 28  GLUCOSE 102*  BUN 17  CREATININE 0.84  CALCIUM 9.9   CBG (last 3)  No results found for this basename: GLUCAP:3 in the last 72 hours  Wt Readings from Last 3 Encounters:  10/27/11 116.529 kg (256 lb 14.4 oz)  10/18/11 121.1 kg (266 lb 15.6 oz)  10/18/11 121.1 kg (266 lb 15.6 oz)    Physical Exam:  Nursing note and vitals reviewed.  Constitutional: He is oriented to person, place, and time. He appears well-developed and well-nourished.  Morbidly obese male with staples right scalp and tracheostomy. #6 trach  HENT:  Head: Normocephalic and atraumatic.  Tongue with greenish white coating. Poor dentition. Missing multiple teeth  Eyes: Pupils are equal, round, and reactive to light.  Unable to close left eye. Dysconjugate gaze, exopthalmus. Cannot gaze upward Neck:  Trach out. Wound with some debris. Actually phonating well Cardiovascular: Normal rate and regular rhythm.  Pulmonary/Chest: Effort normal.  Bilateral rhonchi decreased. No distress. No cough Abdominal: Soft. Bowel sounds are normal. He exhibits no distension. There is no tenderness.  PEG site clean and dry.  Musculoskeletal: He exhibits tenderness (left knee with ROM.).  Neurological: He is alert and oriented to person, place, and time. He displays abnormal reflex.  Able to state DOB, current month but not the date. Polite and appropriate. Follows basic commands without difficulty. Speech quality later. Left facial weakness. Unable to look  upward. Dense left hemiparesis with flexor tone at knee. sustained clonus left foot. Senses pain in left leg and arm. LUE   trace. LLE 0-1 on MMT Skin: Skin is warm and dry.    Assessment/Plan: 1. 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 Physiatrist is providing close team supervision and 24 hour management of active medical problems listed below. Physiatrist and rehab team continue to assess barriers to discharge/monitor patient progress toward functional and medical goals. FIM: FIM - Bathing Bathing Steps Patient Completed: Chest;Abdomen;Right upper leg;Left upper leg Bathing: 1: Total-Patient completes 0-2 of 10 parts or less than 25%  FIM - Upper Body Dressing/Undressing Upper body dressing/undressing: 0: Wears gown/pajamas-no public clothing FIM - Lower Body Dressing/Undressing Lower body dressing/undressing steps patient completed: Don/Doff right sock;Don/Doff left sock Lower body dressing/undressing: 0: Wears gown/pajamas-no public clothing  FIM - Toileting Toileting: 0: Activity did not occur  FIM - Archivist Transfers: 0-Activity did not occur  FIM - Banker Devices: Sliding board Bed/Chair Transfer: 2: Supine > Sit: Max A (lifting assist/Pt. 25-49%);1: Two helpers;1: Mechanical lift  FIM - Locomotion: Wheelchair Locomotion: Wheelchair: 1: Total Assistance/staff pushes wheelchair (Pt<25%) FIM - Locomotion: Ambulation Locomotion: Ambulation: 0: Activity did not occur  Comprehension Comprehension Mode: Auditory Comprehension: 2-Understands basic 25 - 49% of the time/requires cueing 51 - 75% of the time  Expression Expression Mode: Verbal Expression Assistive Devices: 6-Talk trach  valve Expression: 2-Expresses basic 25 - 49% of the time/requires  cueing 50 - 75% of the time. Uses single words/gestures.  Social Interaction Social Interaction: 3-Interacts appropriately 50 - 74% of the time - May be physically or verbally inappropriate.  Problem Solving Problem Solving: 1-Solves basic less than 25% of the time - needs direction nearly all the time or does not effectively solve problems and may need a restraint for safety  Memory Memory: 1-Recognizes or recalls less than 25% of the time/requires cueing greater than 75% of the time  Medical Problem List and Plan:  1. DVT Prophylaxis/Anticoagulation: Pharmaceutical: Lovenox  2. Pain Management:    baclofen for spasticity and voltaren gel for left knee pain.  3. Mood: No signs of distress noted.  4. Neuropsych: This patient is is not capable of making decisions on his/her own behalf.  5. HTN: Monitor with bid checks. continue metoprolol bid. Improvement overall 6. ABLA:  .   iron supplement if tolerating new diet without recurrent N/V  -labs improving 7. Tone:  baclofen trial has helped. PRAFO for LLW 8. Pulmonary:doing well s/p self-decannulation  -needs to keep dressing on but frequently pulls at it 9. FEN: eating fairly well  -too early to take PEG out LOS (Days) 6 A FACE TO FACE EVALUATION WAS PERFORMED  Jaelin Fackler T 10/28/2011, 6:47 AM

## 2011-10-28 NOTE — Progress Notes (Signed)
[facility] Individual Statement of Services  Patient Name:  Jeremy [name]  Date:  10/28/2011  Welcome [facility] the [facility].  Our goal is to provide you with an individualized program based on your diagnosis and situation, designed to meet your specific needs.  With this comprehensive rehabilitation program, you will be expected to participate in at least 3 hours of rehabilitation therapies Monday-Friday, with modified therapy programming on the weekends.  Your rehabilitation program will include the following services:  Physical Therapy (PT), Occupational Therapy (OT), Speech Therapy (ST), 24 hour per day rehabilitation nursing, Therapeutic Recreaction (TR), Neuropsychology, Case Management (RN and Social Worker), Rehabilitation Medicine, Nutrition Services and Pharmacy Services  Weekly team conferences will be held on Tuesdays to discuss your progress.  Your  Social Worker will talk with you frequently to get your input and to update you on team discussions.  Team conferences with you and your family in attendance may also be held.  Expected length of stay: 4 weeks  Overall anticipated outcome: minimal assistance w/c level  Depending on your progress and recovery, your program may change.  Your  Social Worker will coordinate services and will keep you informed of any changes.  Your  Social Worker's name and contact numbers are listed  below.  The following services may also be recommended but are not provided by the [facility]:   Driving Evaluations  Home Health Rehabiltiation Services  Outpatient Rehabil[facility]  Vocational Rehabilitation   Arrangements will be made to provide these services after discharge if needed.  Arrangements include referral to agencies that provide these services.  Your insurance has been verified to be:  [employer] Your primary doctor is:  None  Pertinent information will be shared with  your doctor and your insurance company.  Social Worker:  [place], [place] [phone] or ([phone]  Information discussed with and copy given to patient by: Amada Jupiter, 10/28/2011, 3:01 PM

## 2011-10-28 NOTE — Progress Notes (Signed)
Speech Language Pathology Daily Session Note  Patient Details  Name: Jeremy [name] MRN: [id number] Date of Birth: Oct 18, 1965  Today's Date: 10/28/2011 Time: 5621-3086 Time Calculation (min): 10 min  Short Term Goals: Week 1: SLP Short Term Goal 1 (Week 1): Pt will utilize increased vocal intensity at the phrase level with Min verbal cues.  SLP Short Term Goal 2 (Week 1): Pt will orient to place, time and situation with supervision contextual and enviornmental cues.  SLP Short Term Goal 3 (Week 1): Pt will sustain attention to functional task for ~10 minutes with Mod A verbal cues for redirection  SLP Short Term Goal 4 (Week 1): Pt will attend to left field of enviornments/body during functional tasks with Mod A verbal and semantic cues.  SLP Short Term Goal 5 (Week 1): Pt will initiate functional tasks with Min A verbal cues.  SLP Short Term Goal 6 (Week 1): Pt will demonstrate functional problem solving for functional and familair tasks with Mod A verbal cues.   Skilled Therapeutic Interventions: Co-treatment group session;  SLP facilitated session with min assist verbal cues to initiatlly recall safe swallow compensatory strategeis.  Patient then required supervision level vebral cues to utilize during self-feeding and mod assit verbal cues to monitor and compensate for left anterior loss of bolus.  Patient required no cues to select attention during task.    FIM:  Comprehension Comprehension Mode: Auditory Comprehension: 2-Understands basic 25 - 49% of the time/requires cueing 51 - 75% of the time Expression Expression Mode: Verbal Expression: 2-Expresses basic 25 - 49% of the time/requires cueing 50 - 75% of the time. Uses single words/gestures. Social Interaction Social Interaction: 3-Interacts appropriately 50 - 74% of the time - May be physically or verbally inappropriate. Problem Solving Problem Solving: 2-Solves basic 25 - 49% of the time - needs direction more than half the  time to initiate, plan or complete simple activities Memory Memory: 2-Recognizes or recalls 25 - 49% of the time/requires cueing 51 - 75% of the time FIM - Eating Eating Activity: 5: Set-up assist for open containers;5: Set-up assist for cut food  Pain Pain Assessment Pain Assessment: No/denies pain  Therapy/Group: Group Therapy  Charlane Ferretti., CCC-SLP [phone]  Jeremy [name] 10/28/2011, 1:25 PM

## 2011-10-29 ENCOUNTER — Inpatient Hospital Stay (HOSPITAL_COMMUNITY): Payer: BC Managed Care – PPO

## 2011-10-29 ENCOUNTER — Inpatient Hospital Stay (HOSPITAL_COMMUNITY): Payer: BC Managed Care – PPO | Admitting: Speech Pathology

## 2011-10-29 ENCOUNTER — Inpatient Hospital Stay (HOSPITAL_COMMUNITY): Payer: BC Managed Care – PPO | Admitting: Occupational Therapy

## 2011-10-29 DIAGNOSIS — J9600 Acute respiratory failure, unspecified whether with hypoxia or hypercapnia: Secondary | ICD-10-CM

## 2011-10-29 DIAGNOSIS — Z5189 Encounter for other specified aftercare: Secondary | ICD-10-CM

## 2011-10-29 DIAGNOSIS — R252 Cramp and spasm: Secondary | ICD-10-CM

## 2011-10-29 DIAGNOSIS — I619 Nontraumatic intracerebral hemorrhage, unspecified: Secondary | ICD-10-CM

## 2011-10-29 DIAGNOSIS — I10 Essential (primary) hypertension: Secondary | ICD-10-CM

## 2011-10-29 NOTE — Progress Notes (Signed)
Occupational Therapy Note  Patient Details  Name: Jeremy [name] MRN: [id number] Date of Birth: [birth year] Today's Date: 10/29/2011  Time:  1130-1145  (15 min) Group therapy Pain:  None  Engaged in Self feeding in group. Focus on maintaining selective attention, following precautions postioning in weight bearing the LUE, L side awareness while eating.  . Pt currently requires max facilitation to attend to and use LUE and to scan to the left as well as wipe L side of mouth.   Humberto Seals 10/29/2011, 1:25 PM

## 2011-10-29 NOTE — Progress Notes (Signed)
Subjective/Complaints: Seems to have rested fairly well. A 12 point review of systems has been performed and if not noted above is otherwise negative.   Objective: Vital Signs: Blood pressure 134/86, pulse 89, temperature 98.9 F (37.2 C), temperature source Oral, resp. rate 17, weight 116.529 kg (256 lb 14.4 oz), SpO2 100.00%. No results found. No results found for this basename: WBC:2,HGB:2,HCT:2,PLT:2 in the last 72 hours No results found for this basename: NA:2,K:2,CL:2,CO2:2,GLUCOSE:2,BUN:2,CREATININE:2,CALCIUM:2 in the last 72 hours CBG (last 3)  No results found for this basename: GLUCAP:3 in the last 72 hours  Wt Readings from Last 3 Encounters:  10/27/11 116.529 kg (256 lb 14.4 oz)  10/18/11 121.1 kg (266 lb 15.6 oz)  10/18/11 121.1 kg (266 lb 15.6 oz)    Physical Exam:  Nursing note and vitals reviewed.  Constitutional: He is oriented to person, place, and time. He appears well-developed and well-nourished.  Morbidly obese male  .   HENT:  Head: Normocephalic and atraumatic.  Tongue with greenish white coating. Poor dentition. Missing multiple teeth  Eyes: Pupils are equal, round, and reactive to light.  Unable to close left eye. Dysconjugate gaze, exopthalmus. Cannot gaze upward Neck:  trach Wound with some debris.   Cardiovascular: Normal rate and regular rhythm.  Pulmonary/Chest: Effort normal.   No distress. No cough. clear Abdominal: Soft. Bowel sounds are normal. He exhibits no distension. There is no tenderness.  PEG site clean and dry.  Musculoskeletal: He exhibits tenderness (left knee with ROM.).  Neurological: He is alert and oriented to person, place, and time. He displays abnormal reflex.  Able to state DOB, current month but not the date. Polite and appropriate. Follows basic commands without difficulty. Speech quality later. Left facial weakness. Unable to look upward. Dense left hemiparesis with flexor tone at knee. sustained clonus left foot. Senses  pain in left leg and arm. LUE   trace. LLE 0-1 on MMT Skin: Skin is warm and dry.    Assessment/Plan: 1. 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 Physiatrist is providing close team supervision and 24 hour management of active medical problems listed below. Physiatrist and rehab team continue to assess barriers to discharge/monitor patient progress toward functional and medical goals. FIM: FIM - Bathing Bathing Steps Patient Completed: Chest;Abdomen;Right upper leg;Left upper leg Bathing: 1: Total-Patient completes 0-2 of 10 parts or less than 25%  FIM - Upper Body Dressing/Undressing Upper body dressing/undressing steps patient completed: Thread/unthread right sleeve of pullover shirt/dresss Upper body dressing/undressing: 2: Max-Patient completed 25-49% of tasks FIM - Lower Body Dressing/Undressing Lower body dressing/undressing steps patient completed: Don/Doff right sock;Don/Doff left sock Lower body dressing/undressing: 0: Wears gown/pajamas-no public clothing  FIM - Toileting Toileting: 0: Activity did not occur  FIM - Archivist Transfers: 0-Activity did not occur  FIM - Banker Devices: Sliding board Bed/Chair Transfer: 1: Two helpers;1: Mechanical lift  FIM - Locomotion: Wheelchair Locomotion: Wheelchair: 1: Total Assistance/staff pushes wheelchair (Pt<25%) FIM - Locomotion: Ambulation Locomotion: Ambulation: 0: Activity did not occur  Comprehension Comprehension Mode: Auditory Comprehension: 3-Understands basic 50 - 74% of the time/requires cueing 25 - 50%  of the time  Expression Expression Mode: Verbal Expression Assistive Devices: 6-Talk trach valve Expression: 6-Expresses complex ideas: With extra time/assistive device  Social  Interaction Social Interaction: 3-Interacts appropriately 50 - 74% of the time - May be  physically or verbally inappropriate.  Problem Solving Problem Solving: 3-Solves basic 50 - 74% of the time/requires cueing 25 - 49% of the time  Memory Memory: 2-Recognizes or recalls 25 - 49% of the time/requires cueing 51 - 75% of the time  Medical Problem List and Plan:  1. DVT Prophylaxis/Anticoagulation: Pharmaceutical: Lovenox  2. Pain Management:    baclofen for spasticity and voltaren gel for left knee pain.  3. Mood: No signs of distress noted.  4. Neuropsych: This patient is is not capable of making decisions on his/her own behalf.  5. HTN: Monitor with bid checks. continue metoprolol bid. Improvement overall 6. ABLA:  .   iron supplement if tolerating new diet without recurrent N/V  -labs improving 7. Tone:  baclofen trial has helped. PRAFO for LLW 8. Pulmonary:doing well s/p self-decannulation  -needs to keep dressing on but frequently pulls at it 9. FEN: eating fairly well  -PEG needs to stay in 4-6 weeks LOS (Days) 7 A FACE TO FACE EVALUATION WAS PERFORMED  SWARTZ,ZACHARY T 10/29/2011, 6:36 AM

## 2011-10-29 NOTE — Progress Notes (Signed)
Speech Language Pathology Daily Session Note  Patient Details  Name: Jeremy [name] MRN: [id number] Date of Birth: [birth year]  Today's Date: 10/29/2011 Time: 1315-1400 Time Calculation (min): 45 min  Short Term Goals: Week 1: SLP Short Term Goal 1 (Week 1): Pt will utilize increased vocal intensity at the phrase level with Min verbal cues.  SLP Short Term Goal 2 (Week 1): Pt will orient to place, time and situation with supervision contextual and enviornmental cues.  SLP Short Term Goal 3 (Week 1): Pt will sustain attention to functional task for ~10 minutes with Mod A verbal cues for redirection  SLP Short Term Goal 4 (Week 1): Pt will attend to left field of enviornments/body during functional tasks with Mod A verbal and semantic cues.  SLP Short Term Goal 5 (Week 1): Pt will initiate functional tasks with Min A verbal cues.  SLP Short Term Goal 6 (Week 1): Pt will demonstrate functional problem solving for functional and familair tasks with Mod A verbal cues.   Skilled Therapeutic Interventions: Treatment focus on left attention, sequencing and problem solving. Pt required Max verbal and question cues to utilize call bell to request assistance to use the bathroom. Pt also required Max A verbal and tactile cues for sustained attention to sequencing/problem solving task and required Mod A question cues for problem solving of task. Pt appeared more lethargic during our session due to being in bed and performing bed mobility tasks in order to use the bedpan.    FIM:  Comprehension Comprehension Mode: Auditory Comprehension: 3-Understands basic 50 - 74% of the time/requires cueing 25 - 50%  of the time Expression Expression Mode: Verbal Expression: 4-Expresses basic 75 - 89% of the time/requires cueing 10 - 24% of the time. Needs helper to occlude trach/needs to repeat words. Social Interaction Social Interaction: 3-Interacts appropriately 50 - 74% of the time - May be physically or verbally  inappropriate. Problem Solving Problem Solving: 3-Solves basic 50 - 74% of the time/requires cueing 25 - 49% of the time Memory Memory: 2-Recognizes or recalls 25 - 49% of the time/requires cueing 51 - 75% of the time FIM - Eating Eating Activity: 5: Supervision/cues;5: Set-up assist for open containers  Pain Pain Assessment Pain Assessment: No/denies pain  Therapy/Group: Individual Therapy  Larrell Rapozo 10/29/2011, 4:51 PM

## 2011-10-29 NOTE — Progress Notes (Signed)
Occupational Therapy Session Note  Patient Details  Name: Jeremy [name] MRN: [id number] Date of Birth: 17-Mar-1965  Today's Date: 10/29/2011 Time: 1000-1103 Time Calculation (min): 63 min   Skilled Therapeutic Interventions/Progress Updates:    Worked on static and dynamic sitting balance EOM.  Pt able to reach forward and across his body to the left to grab ROM arc rings while positioning LUE in weightbearing.  Frequent LOB to the left side requiring max instructional cueing and mod assist to self correct.  Pt with decreased selective attention during the task frequently needing max instructional cues for initiation of reaching.  Utilized maxi sky for transfer to mat and back to wheelchair.  Pt stating X 3 during session that he had to go to the bathroom.  Therapist took him back and positioned him back in bed for clothing management to use bed pan.  When asked if he needed to use the bathroom he replied no, but needed max questioning cues to answer the actual question secondary to decreased attention.  Transferred to back to wheelchair with maxi sky in preparation for diner's club.  Therapy Documentation Precautions:  Precautions Precautions: Fall Precaution Comments: trach collar with passy muir valve; continuous monitor of sats Restrictions Weight Bearing Restrictions: No  Pain: Pain Assessment Pain Assessment: No/denies pain ADL: See FIM for current functional status  Therapy/Group: Individual Therapy  Finas Delone OTR/L 10/29/2011, 12:01 PM

## 2011-10-29 NOTE — Progress Notes (Signed)
Speech Language Pathology Weekly Progress Note  Patient Details  Name: Jeremy [name] MRN: [id number] Date of Birth: 1965/04/18  Today's Date: 10/29/2011  Short Term Goals: Week 1: SLP Short Term Goal 1 (Week 1): Pt will utilize increased vocal intensity at the phrase level with Min verbal cues.  SLP Short Term Goal 1 - Progress (Week 1): Not met SLP Short Term Goal 2 (Week 1): Pt will orient to place, time and situation with supervision contextual and enviornmental cues.  SLP Short Term Goal 2 - Progress (Week 1): Met SLP Short Term Goal 3 (Week 1): Pt will sustain attention to functional task for ~10 minutes with Mod A verbal cues for redirection  SLP Short Term Goal 3 - Progress (Week 1): Not met SLP Short Term Goal 4 (Week 1): Pt will attend to left field of enviornments/body during functional tasks with Mod A verbal and semantic cues.  SLP Short Term Goal 4 - Progress (Week 1): Not met SLP Short Term Goal 5 (Week 1): Pt will initiate functional tasks with Min A verbal cues.  SLP Short Term Goal 5 - Progress (Week 1): Met SLP Short Term Goal 6 (Week 1): Pt will demonstrate functional problem solving for functional and familair tasks with Mod A verbal cues.  SLP Short Term Goal 6 - Progress (Week 1): Met  New Short Term Goals:  Week 2: SLP Short Term Goal 1 (Week 2): Pt will demonstrate functional problem solving with basic and familiar tasks with min A verbal cues.  SLP Short Term Goal 2 (Week 2): Pt will initiate functional tasks with supervision verbal and tactile cues.  SLP Short Term Goal 3 (Week 2): Pt will attend to left enviornment and body with Mod A verbal and question cues.  SLP Short Term Goal 4 (Week 2): pt will utilize increased vocal intensity with  Min A verbal cues at the phrase level SLP Short Term Goal 5 (Week 2): Pt will utilize swallowing compensatory strategies with supervision verbal cues.  SLP Short Term Goal 6 (Week 2): Pt will sustain attention to functional  task for 5 minutes with Max A verbal cues for redirection   Weekly Progress Updates: Pt has made functional gains and has met 3 out of 6 STG's this reporting period. Currently, pt is overall Max A for sustained attention and working memory and Mod A for problem solving and initiation. Pt is consuming regular textures with thin liquids but requires cueing for utilization of swallowing compensatory strategies. Pt/family education ongoing. Pt would benefit from continued skilled SLP intervention to maximize cognitive function and overall independence.    SLP Frequency: 1-2 X/day, 30-60 minutes Estimated Length of Stay: 3 weeks SLP Treatment/Interventions: Cognitive remediation/compensation;Speech/Language facilitation;Cueing hierarchy;Functional tasks;Therapeutic Activities;Patient/family education;Internal/external aids  Daily Session FIM:  Comprehension Comprehension Mode: Auditory Comprehension: 3-Understands basic 50 - 74% of the time/requires cueing 25 - 50%  of the time Expression Expression Mode: Verbal Expression: 4-Expresses basic 75 - 89% of the time/requires cueing 10 - 24% of the time. Needs helper to occlude trach/needs to repeat words. Social Interaction Social Interaction: 3-Interacts appropriately 50 - 74% of the time - May be physically or verbally inappropriate. Problem Solving Problem Solving: 3-Solves basic 50 - 74% of the time/requires cueing 25 - 49% of the time Memory Memory: 2-Recognizes or recalls 25 - 49% of the time/requires cueing 51 - 75% of the time Pain Pain Assessment Pain Assessment: No/denies pain    Blease Capaldi 10/29/2011, 5:04 PM

## 2011-10-29 NOTE — Progress Notes (Signed)
Speech Language Pathology Daily Session Note  Patient Details  Name: Jeremy [name] MRN: [id number] Date of Birth: 01-13-1966  Today's Date: 10/29/2011 Time: 1145-1200 Time Calculation (min): 15 min  Short Term Goals: Week 1: SLP Short Term Goal 1 (Week 1): Pt will utilize increased vocal intensity at the phrase level with Min verbal cues.  SLP Short Term Goal 2 (Week 1): Pt will orient to place, time and situation with supervision contextual and enviornmental cues.  SLP Short Term Goal 3 (Week 1): Pt will sustain attention to functional task for ~10 minutes with Mod A verbal cues for redirection  SLP Short Term Goal 4 (Week 1): Pt will attend to left field of enviornments/body during functional tasks with Mod A verbal and semantic cues.  SLP Short Term Goal 5 (Week 1): Pt will initiate functional tasks with Min A verbal cues.  SLP Short Term Goal 6 (Week 1): Pt will demonstrate functional problem solving for functional and familair tasks with Mod A verbal cues.   Skilled Therapeutic Interventions: Co-treatment group session; SLP facilitated session by educating wife regarding safe swallow compensatory strategies and patient required supervision level verbal cues to utilize during meal.  Patient demonstrated no overt s/s of aspiration during meal.     FIM:  FIM - Eating Eating Activity: 5: Supervision/cues;5: Set-up assist for open containers  Pain Pain Assessment Pain Assessment: No/denies pain  Therapy/Group: Group Therapy  Charlane Ferretti., CCC-SLP [phone]  [name] 10/29/2011, 12:57 PM

## 2011-10-29 NOTE — Progress Notes (Signed)
Physical Therapy Weekly Progress Note  Patient Details  Name: Jeremy [name] MRN: [id number] Date of Birth: 1966/01/15  Today's Date: 10/29/2011 Time: 0900-0955 Time Calculation (min): 55 min  Patient has met 2 of 4 short term goals. Patient met goals related to sitting balance and wheelchair mobility. Patient progressing in bed mobility and standing.  Patient continues to demonstrate the following deficits: left hemiplegia, motor apraxia, decreased initiation, decreased awareness and dependence of +2 for mobility for safety and therefore will continue to benefit from skilled PT intervention to enhance overall performance with balance, postural control, functional use of  left upper extremity and left lower extremity, attention and awareness.  Patient progressing toward long term goals..  Continue plan of care.  PT Short Term Goals Week 2:     Skilled Therapeutic Interventions/Progress Updates:  Patient rolled to left using bed rail with minimal assistance to help in placement of lift sling. Patient requires +2 max assist to roll to right. Patient requires cueing to attend to left UE. Patient transferred to wheelchair using [facility] ([facility]) lift. Patient performed sit to stand with +2 max assist at parallel bars. Patient able to maintain standing 10 seconds, 20 seconds, and 30 seconds x 2. Patient propelled wheelchair using right LE 50 feet with max assist. Patient is in the tile n space wheelchair and does not have a wheel to push with right UE. Plan to transition to a regular wheelchair next week to begin wheelchair propulsion.   Therapy Documentation Precautions:  Precautions Precautions: Fall Precaution Comments: trach collar with passy muir valve; continuous monitor of sats Restrictions Weight Bearing Restrictions: No  Pain: Pain Assessment Pain Assessment: No/denies pain   See FIM for current functional status  Therapy/Group: Individual Therapy  Arelia Longest M 10/29/2011, 10:09  AM

## 2011-10-30 ENCOUNTER — Inpatient Hospital Stay (HOSPITAL_COMMUNITY): Payer: BC Managed Care – PPO | Admitting: Speech Pathology

## 2011-10-30 ENCOUNTER — Inpatient Hospital Stay (HOSPITAL_COMMUNITY): Payer: BC Managed Care – PPO | Admitting: Physical Therapy

## 2011-10-30 NOTE — Progress Notes (Signed)
Occupational Therapy Session Note  Patient Details  Name: Jeremy [name] MRN: [id number] Date of Birth: [birth year]  Today's Date: 10/30/2011 Time: 0865-7846 Time Calculation (min): 35 min   Skilled Therapeutic Interventions/Progress Updates: diners club with focus on slowing down to safely eat/drink.      Therapy Documentation Precautions:  Precautions Precautions: Fall Precaution Comments: trach collar with passy muir valve; continuous monitor of sats Restrictions Weight Bearing Restrictions: No   Pain:denied  See FIM for current functional status  Therapy/Group: Group Therapy  Jeremy [name] 10/30/2011, 3:31 PM

## 2011-10-30 NOTE — Progress Notes (Signed)
Patient ID: Jeremy [name], male   DOB: 1965/10/09, 46 y.o.   MRN: [id number] Subjective/Complaints: 9/14.  Stable. Uneventful night A 12 point review of systems has been performed and if not noted above is otherwise negative.  General exam. No distress. HEENT- prior trach site clean; Chest clear anterolaterally. Cardiovascular normal heart sounds no murmur. Abdomen- soft,  PEG noted. Foley catheter in place. Extremities no edema left ankle brace. Left hemipares  BP Readings from Last 3 Encounters:  10/30/11 146/90  10/22/11 135/78  10/22/11 135/78   Ob andis.jective: Vital Signs: Blood pressure 146/90, pulse 85, temperature 97.7 F (36.5 C), temperature source Axillary, resp. rate 21, weight 116.529 kg (256 lb 14.4 oz), SpO2 94.00%. No results found. No results found for this basename: WBC:2,HGB:2,HCT:2,PLT:2 in the last 72 hours No results found for this basename: NA:2,K:2,CL:2,CO2:2,GLUCOSE:2,BUN:2,CREATININE:2,CALCIUM:2 in the last 72 hours CBG (last 3)  No results found for this basename: GLUCAP:3 in the last 72 hours  Wt Readings from Last 3 Encounters:  10/27/11 116.529 kg (256 lb 14.4 oz)  10/18/11 121.1 kg (266 lb 15.6 oz)  10/18/11 121.1 kg (266 lb 15.6 oz)    Physical Exam:  Nursing note and vitals reviewed.  Constitutional: He is oriented to person, place, and time. He appears well-developed and well-nourished.  Morbidly obese male  .   HENT:  Head: Normocephalic and atraumatic.  Tongue with greenish white coating. Poor dentition. Missing multiple teeth  Eyes: Pupils are equal, round, and reactive to light.  Unable to close left eye. Dysconjugate gaze, exopthalmus. Cannot gaze upward Neck:  trach Wound with some debris.   Cardiovascular: Normal rate and regular rhythm.  Pulmonary/Chest: Effort normal.   No distress. No cough. clear Abdominal: Soft. Bowel sounds are normal. He exhibits no distension. There is no tenderness.  PEG site clean and dry.    Musculoskeletal: He exhibits tenderness (left knee with ROM.).  Neurological: He is alert and oriented to person, place, and time. He displays abnormal reflex.  Able to state DOB, current month but not the date. Polite and appropriate. Follows basic commands without difficulty. Speech quality later. Left facial weakness. Unable to look upward. Dense left hemiparesis with flexor tone at knee. sustained clonus left foot. Senses pain in left leg and arm. LUE   trace. LLE 0-1 on MMT Skin: Skin is warm and dry.    Assessment/Plan: 1. 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 Physiatrist is providing close team supervision and 24 hour management of active medical problems listed below. Physiatrist and rehab team continue to assess barriers to discharge/monitor patient progress toward functional and medical goals. FIM: FIM - Bathing Bathing Steps Patient Completed: Chest;Abdomen;Right upper leg;Left upper leg Bathing: 1: Total-Patient completes 0-2 of 10 parts or less than 25%  FIM - Upper Body Dressing/Undressing Upper body dressing/undressing steps patient completed: Thread/unthread right sleeve of pullover shirt/dresss Upper body dressing/undressing: 2: Max-Patient completed 25-49% of tasks FIM - Lower Body Dressing/Undressing Lower body dressing/undressing steps patient completed: Don/Doff right sock;Don/Doff left sock Lower body dressing/undressing: 0: Wears gown/pajamas-no public clothing  FIM - Toileting Toileting: 0: Activity did not occur  FIM - Archivist Transfers: 0-Activity did not occur  FIM - Banker Devices: Sliding board Bed/Chair Transfer: 1: Mechanical lift;1: Two helpers  FIM - Locomotion: Wheelchair Locomotion: Wheelchair: 2: Travels 50 - 149 ft  with maximal assistance (Pt: 25 - 49%) FIM - Locomotion: Ambulation Locomotion: Ambulation: 0: Activity did not occur  Comprehension Comprehension Mode: Auditory Comprehension: 3-Understands basic 50 - 74% of the time/requires cueing 25 - 50%  of the time  Expression Expression Mode: Verbal Expression Assistive Devices: 6-Talk trach valve Expression: 4-Expresses basic 75 - 89% of the time/requires cueing 10 - 24% of the time. Needs helper to occlude trach/needs to repeat words.  Social Interaction Social Interaction: 3-Interacts appropriately 50 - 74% of the time - May be physically or verbally inappropriate.  Problem Solving Problem Solving: 3-Solves basic 50 - 74% of the time/requires cueing 25 - 49% of the time  Memory Memory: 2-Recognizes or recalls 25 - 49% of the time/requires cueing 51 - 75% of the time  Medical Problem List and Plan:  1. DVT Prophylaxis/Anticoagulation: Pharmaceutical: Lovenox  2. Pain Management:    baclofen for spasticity and voltaren gel for left knee pain.  3. Mood: No signs of distress noted.  4. Neuropsych: This patient is is not capable of making decisions on his/her own behalf.  5. HTN: Monitor with bid checks. continue metoprolol bid. Improvement overall 6. ABLA:  .   iron supplement if tolerating new diet without recurrent N/V  -labs improving 7. Tone:  baclofen trial has helped. PRAFO for LLW 8. Pulmonary:doing well s/p self-decannulation  -needs to keep dressing on but frequently pulls at it 9. FEN: eating fairly well  -PEG needs to stay in 4-6 weeks LOS (Days) 8 A FACE TO FACE EVALUATION WAS PERFORMED  Rogelia Boga 10/30/2011, 8:40 AM

## 2011-10-30 NOTE — Progress Notes (Signed)
Speech Language Pathology Daily Session Note  Patient Details  Name: Jeremy [name] MRN: [id number] Date of Birth: [birth year]  Today's Date: 10/30/2011 Time: 1130-1145 Time Calculation (min): 15 min  Short Term Goals: Week 2: SLP Short Term Goal 1 (Week 2): Pt will demonstrate functional problem solving with basic and familiar tasks with min A verbal cues.  SLP Short Term Goal 2 (Week 2): Pt will initiate functional tasks with supervision verbal and tactile cues.  SLP Short Term Goal 3 (Week 2): Pt will attend to left enviornment and body with Mod A verbal and question cues.  SLP Short Term Goal 4 (Week 2): pt will utilize increased vocal intensity with  Min A verbal cues at the phrase level SLP Short Term Goal 5 (Week 2): Pt will utilize swallowing compensatory strategies with supervision verbal cues.  SLP Short Term Goal 6 (Week 2): Pt will sustain attention to functional task for 5 minutes with Max A verbal cues for redirection   Skilled Therapeutic Interventions: Co-treatment group session; SLP facilitated session with min assist verbal cues to initially recall safe swallow compensatory strategies. Patient then required supervision level verbal cues to utilize during self-feeding and min assist verbal cues to monitor and compensate for left anterior loss of bolus. Patient required no cues to select attention during task.  Brother-in-law present for session.    FIM:  Comprehension Comprehension Mode: Auditory Comprehension: 3-Understands basic 50 - 74% of the time/requires cueing 25 - 50%  of the time Expression Expression Mode: Verbal Expression: 4-Expresses basic 75 - 89% of the time/requires cueing 10 - 24% of the time. Needs helper to occlude trach/needs to repeat words. Social Interaction Social Interaction: 3-Interacts appropriately 50 - 74% of the time - May be physically or verbally inappropriate. Problem Solving Problem Solving: 3-Solves basic 50 - 74% of the time/requires  cueing 25 - 49% of the time Memory Memory: 2-Recognizes or recalls 25 - 49% of the time/requires cueing 51 - 75% of the time FIM - Eating Eating Activity: 5: Supervision/cues;5: Set-up assist for open containers  Pain Pain Assessment Pain Assessment: No/denies pain  Therapy/Group: Group Therapy  Charlane Ferretti., CCC-SLP [phone]  Daniell Paradise 10/30/2011, 1:11 PM

## 2011-10-30 NOTE — Progress Notes (Signed)
Physical Therapy Note  Patient Details  Name: Jeremy [name] MRN: [id number] Date of Birth: [birth year] Today's Date: 10/30/2011  1515 Pt missed 45 minute PT session- unable to arouse.   Timmi Devora,JIM 10/30/2011, 8:25 AM

## 2011-10-31 ENCOUNTER — Inpatient Hospital Stay (HOSPITAL_COMMUNITY): Payer: BC Managed Care – PPO | Admitting: Physical Therapy

## 2011-10-31 NOTE — Progress Notes (Signed)
Patient ID: Jeremy [name], male   DOB: 1965-11-07, 46 y.o.   MRN: [id number] Patient ID: Jeremy [name], male   DOB: 1965-07-21, 46 y.o.   MRN: [id number] Subjective/Complaints: 9/15.   Stable. Uneventful night. only complaint is right ankle pain-states that he has had a remote fracture to this area.   A 12 point review of systems has been performed and if not noted above is otherwise negative.  General exam. No distress. HEENT- prior trach site clean; Chest clear anterolaterally. Cardiovascular normal heart sounds no murmur. Abdomen- soft,  PEG noted. Condom catheter in place. Extremities no edema left ankle brace. Left hemipares  BP Readings from Last 3 Encounters:  10/31/11 151/99  10/22/11 135/78  10/22/11 135/78   Ob andis.jective: Vital Signs: Blood pressure 151/99, pulse 85, temperature 98.5 F (36.9 C), temperature source Oral, resp. rate 20, weight 116.529 kg (256 lb 14.4 oz), SpO2 95.00%. No results found. No results found for this basename: WBC:2,HGB:2,HCT:2,PLT:2 in the last 72 hours No results found for this basename: NA:2,K:2,CL:2,CO2:2,GLUCOSE:2,BUN:2,CREATININE:2,CALCIUM:2 in the last 72 hours CBG (last 3)  No results found for this basename: GLUCAP:3 in the last 72 hours  Wt Readings from Last 3 Encounters:  10/27/11 116.529 kg (256 lb 14.4 oz)  10/18/11 121.1 kg (266 lb 15.6 oz)  10/18/11 121.1 kg (266 lb 15.6 oz)    Physical Exam:  Nursing note and vitals reviewed.  Constitutional: He is oriented to person, place, and time. He appears well-developed and well-nourished.  Morbidly obese male  .   HENT:  Head: Normocephalic and atraumatic.  Tongue with greenish white coating. Poor dentition. Missing multiple teeth  Eyes: Pupils are equal, round, and reactive to light.  Unable to close left eye. Dysconjugate gaze, exopthalmus. Cannot gaze upward Neck:  trach Wound with some debris.   Cardiovascular: Normal rate and regular rhythm.  Pulmonary/Chest: Effort normal.    No distress. No cough. clear Abdominal: Soft. Bowel sounds are normal. He exhibits no distension. There is no tenderness.  PEG site clean and dry.  Musculoskeletal: He exhibits tenderness (left knee with ROM.).  Neurological: He is alert and oriented to person, place, and time. He displays abnormal reflex.  Able to state DOB, current month but not the date. Polite and appropriate. Follows basic commands without difficulty. Speech quality later. Left facial weakness. Unable to look upward. Dense left hemiparesis with flexor tone at knee. sustained clonus left foot. Senses pain in left leg and arm. LUE   trace. LLE 0-1 on MMT Skin: Skin is warm and dry.    Assessment/Plan: 1. 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 Physiatrist is providing close team supervision and 24 hour management of active medical problems listed below. Physiatrist and rehab team continue to assess barriers to discharge/monitor patient progress toward functional and medical goals. FIM: FIM - Bathing Bathing Steps Patient Completed: Chest;Abdomen;Right upper leg;Left upper leg Bathing: 1: Total-Patient completes 0-2 of 10 parts or less than 25%  FIM - Upper Body Dressing/Undressing Upper body dressing/undressing steps patient completed: Thread/unthread right sleeve of pullover shirt/dresss Upper body dressing/undressing: 0: Wears gown/pajamas-no public clothing FIM - Lower Body Dressing/Undressing Lower body dressing/undressing steps patient completed: Don/Doff right sock;Don/Doff left sock Lower body dressing/undressing: 0: Wears gown/pajamas-no public clothing  FIM - Toileting Toileting: 0: Activity did not occur  FIM - Archivist Transfers: 0-Activity did not occur  FIM - Banker Devices:  Sliding board Bed/Chair Transfer: 1: Mechanical lift  FIM - Locomotion: Wheelchair Locomotion: Wheelchair: 2: Travels 50 - 149 ft with maximal assistance (Pt: 25 - 49%) FIM - Locomotion: Ambulation Locomotion: Ambulation: 0: Activity did not occur  Comprehension Comprehension Mode: Auditory Comprehension: 3-Understands basic 50 - 74% of the time/requires cueing 25 - 50%  of the time  Expression Expression Mode: Verbal Expression Assistive Devices: 6-Talk trach valve Expression: 4-Expresses basic 75 - 89% of the time/requires cueing 10 - 24% of the time. Needs helper to occlude trach/needs to repeat words.  Social Interaction Social Interaction: 3-Interacts appropriately 50 - 74% of the time - May be physically or verbally inappropriate.  Problem Solving Problem Solving: 3-Solves basic 50 - 74% of the time/requires cueing 25 - 49% of the time  Memory Memory: 2-Recognizes or recalls 25 - 49% of the time/requires cueing 51 - 75% of the time  Medical Problem List and Plan:  1. DVT Prophylaxis/Anticoagulation: Pharmaceutical: Lovenox  2. Pain Management:    baclofen for spasticity and voltaren gel for left knee pain.  3. Mood: No signs of distress noted.  4. Neuropsych: This patient is is not capable of making decisions on his/her own behalf.  5. HTN: Monitor with bid checks. continue metoprolol bid. Improvement overall 6. ABLA:  .   iron supplement if tolerating new diet without recurrent N/V  -labs improving 7. Tone:  baclofen trial has helped. PRAFO for LLW 8. Pulmonary:doing well s/p self-decannulation  -needs to keep dressing on but frequently pulls at it 9. FEN: eating fairly well  -PEG needs to stay in 4-6 weeks LOS (Days) 9 A FACE TO FACE EVALUATION WAS PERFORMED  Rogelia Boga 10/31/2011, 8:17 AM

## 2011-10-31 NOTE — Progress Notes (Signed)
Occupational Therapy Note  Patient Details  Name: Jeremy [name] MRN: [id number] Date of Birth: 02/01/1966 Today's Date: 10/31/2011  Time:  1600-1655  (55 min) Individual therapy Pain:  Right ankle pain  (6/10 )  See MAR  Engaged in unsupportive sitting at high/low mat and then at sink.  Pt.  Needed max assist to come forward on mat.  Placed LUE in forearm weight bearing.  Performed grooming at the sink with patient pulling forward with RUE to spit after brushing teeth.  Performed shaving with Pt wetting face and applying cream.  OT did shaving for safety.  Pt's Mom wants to bring clothes up.  I reported she could.     Humberto Seals 10/31/2011, 5:10 PM

## 2011-11-01 ENCOUNTER — Inpatient Hospital Stay (HOSPITAL_COMMUNITY): Payer: BC Managed Care – PPO | Admitting: Speech Pathology

## 2011-11-01 ENCOUNTER — Inpatient Hospital Stay (HOSPITAL_COMMUNITY): Payer: BC Managed Care – PPO

## 2011-11-01 ENCOUNTER — Inpatient Hospital Stay (HOSPITAL_COMMUNITY): Payer: BC Managed Care – PPO | Admitting: Occupational Therapy

## 2011-11-01 MED ORDER — LISINOPRIL 5 MG PO TABS
5.0000 mg | ORAL_TABLET | Freq: Every day | ORAL | Status: DC
  Administered 2011-11-01 – 2011-11-07 (×7): 5 mg via ORAL
  Filled 2011-11-01 (×8): qty 1

## 2011-11-01 MED ORDER — METOPROLOL TARTRATE 25 MG/10 ML ORAL SUSPENSION
50.0000 mg | Freq: Two times a day (BID) | ORAL | Status: DC
  Administered 2011-11-01 – 2011-11-11 (×20): 50 mg via ORAL
  Filled 2011-11-01 (×29): qty 20

## 2011-11-01 NOTE — Progress Notes (Signed)
Nutrition Follow-up  Intervention:  Continue Ensure Complete PO BID and Prostat 30 ml BID to maximize oral intake.  Assessment:   Patient reports good PO intake; consuming 100% of meals and drinking one Ensure Complete daily.   Diet Order:  Regular with Ensure Complete BID  Meds: Scheduled Meds:   . antiseptic oral rinse  15 mL Mouth Rinse TID WC & HS  . baclofen  10 mg Oral TID  . diclofenac sodium  2 g Topical QID  . enoxaparin  40 mg Subcutaneous Q24H  . feeding supplement  237 mL Oral BID BM  . feeding supplement  30 mL Oral BID  . lisinopril  5 mg Oral Daily  . metoprolol tartrate  50 mg Oral Q12H  . multivitamin with minerals  1 tablet Oral Daily  . naphazoline-glycerin  2 drop Left Eye QID  . senna-docusate  1 tablet Oral BID  . DISCONTD: metoprolol tartrate  25 mg Oral Q12H   Continuous Infusions:  PRN Meds:.acetaminophen, alum & mag hydroxide-simeth, baclofen, bisacodyl, cloNIDine, diphenhydrAMINE, guaiFENesin-dextromethorphan, oxyCODONE, prochlorperazine, prochlorperazine, prochlorperazine, sodium chloride, traMADol, traZODone  Labs:  CMP     Component Value Date/Time   NA 136 10/25/2011 1115   K 3.8 10/25/2011 1115   CL 100 10/25/2011 1115   CO2 28 10/25/2011 1115   GLUCOSE 102* 10/25/2011 1115   BUN 17 10/25/2011 1115   CREATININE 0.84 10/25/2011 1115   CALCIUM 9.9 10/25/2011 1115   PROT 7.8 10/25/2011 1115   ALBUMIN 2.9* 10/25/2011 1115   AST 50* 10/25/2011 1115   ALT 77* 10/25/2011 1115   ALKPHOS 102 10/25/2011 1115   BILITOT 0.4 10/25/2011 1115   GFRNONAA >90 10/25/2011 1115   GFRAA >90 10/25/2011 1115     Intake/Output Summary (Last 24 hours) at 11/01/11 1354 Last data filed at 11/01/11 1316  Gross per 24 hour  Intake    960 ml  Output      0 ml  Net    960 ml    Weight:  116.5 kg (9/11)  Re-estimated needs, unchanged:  2200-2400 kcals, 115-130 grams protein, 2.2-2.4 liters fluid daily  Nutrition Dx:  Inadequate oral intake, resolved.  Goal:  Pt to meet >/= 90% of their  estimated nutrition needs, met.  Monitor:  PO intake, weight trend, labs   Joaquin Courts, RD, LDN, CNSC Pager# [phone] After Hours Pager# [phone]

## 2011-11-01 NOTE — Progress Notes (Signed)
Speech Language Pathology Daily Session Note  Patient Details  Name: Erez Mccallum MRN: [id number] Date of Birth: [birth year]  Today's Date: 11/01/2011 Time: 1130-1145 Time Calculation (min): 15 min  Short Term Goals: Week 2: SLP Short Term Goal 1 (Week 2): Pt will demonstrate functional problem solving with basic and familiar tasks with min A verbal cues.  SLP Short Term Goal 2 (Week 2): Pt will initiate functional tasks with supervision verbal and tactile cues.  SLP Short Term Goal 3 (Week 2): Pt will attend to left enviornment and body with Mod A verbal and question cues.  SLP Short Term Goal 4 (Week 2): pt will utilize increased vocal intensity with  Min A verbal cues at the phrase level SLP Short Term Goal 5 (Week 2): Pt will utilize swallowing compensatory strategies with supervision verbal cues.  SLP Short Term Goal 5 - Progress (Week 2): Progressing toward goal SLP Short Term Goal 6 (Week 2): Pt will sustain attention to functional task for 5 minutes with Max A verbal cues for redirection   Skilled Therapeutic Interventions: Co-treatment group session focused on utilization of compensatory strategies with po intake. Patient able to utilize wtih supervision for small bites and sips, and slow rate.    FIM:  Comprehension Comprehension Mode: Auditory Comprehension: 3-Understands basic 50 - 74% of the time/requires cueing 25 - 50%  of the time Expression Expression Mode: Verbal Expression: 4-Expresses basic 75 - 89% of the time/requires cueing 10 - 24% of the time. Needs helper to occlude trach/needs to repeat words. Social Interaction Social Interaction: 3-Interacts appropriately 50 - 74% of the time - May be physically or verbally inappropriate. Problem Solving Problem Solving: 2-Solves basic 25 - 49% of the time - needs direction more than half the time to initiate, plan or complete simple activities Memory Memory: 2-Recognizes or recalls 25 - 49% of the time/requires cueing 51  - 75% of the time FIM - Eating Eating Activity: 5: Supervision/cues;5: Set-up assist for open containers;5: Needs verbal cues/supervision  Pain Pain Assessment Pain Assessment: No/denies pain Pain Score:   7 Pain Type: Acute pain Pain Location: Ankle Pain Orientation: Right Pain Descriptors: Aching Pain Frequency: Occasional Pain Onset: Gradual Patients Stated Pain Goal: 2 Pain Intervention(s): Medication (See eMAR) (oxycodone5mg  po)  Therapy/Group: Group Therapy  Kinta Martis Meryl 11/01/2011, 1:10 PM

## 2011-11-01 NOTE — Progress Notes (Signed)
Subjective/Complaints: Seems to have rested fairly well. A 12 point review of systems has been performed and if not noted above is otherwise negative.   Objective: Vital Signs: Blood pressure 168/98, pulse 97, temperature 97.8 F (36.6 C), temperature source Oral, resp. rate 20, weight 116.529 kg (256 lb 14.4 oz), SpO2 100.00%. No results found. No results found for this basename: WBC:2,HGB:2,HCT:2,PLT:2 in the last 72 hours No results found for this basename: NA:2,K:2,CL:2,CO2:2,GLUCOSE:2,BUN:2,CREATININE:2,CALCIUM:2 in the last 72 hours CBG (last 3)  No results found for this basename: GLUCAP:3 in the last 72 hours  Wt Readings from Last 3 Encounters:  10/27/11 116.529 kg (256 lb 14.4 oz)  10/18/11 121.1 kg (266 lb 15.6 oz)  10/18/11 121.1 kg (266 lb 15.6 oz)    Physical Exam:  Nursing note and vitals reviewed.  Constitutional: He is oriented to person, place, and time. He appears well-developed and well-nourished.  Morbidly obese male  .   HENT:  Head: Normocephalic and atraumatic.   Marland Kitchen Poor dentition. Missing multiple teeth. Vocal quality much improved. Eyes: Pupils are equal, round, and reactive to light.  Unable to close left eye. Dysconjugate gaze, exopthalmus. Cannot gaze upward Neck:  trach Wound with some debris.   Cardiovascular: Normal rate and regular rhythm.  Pulmonary/Chest: Effort normal.   No distress. No cough. clear Abdominal: Soft. Bowel sounds are normal. He exhibits no distension. There is no tenderness.  PEG site clean and dry.  Musculoskeletal: He exhibits tenderness (left knee with ROM.).  Neurological: He is alert and oriented to person, place, and time. He displays abnormal reflex.  Able to state DOB, current month but not the date. Polite and appropriate. Follows basic commands without difficulty.  . Left facial weakness. Unable to look upward. Dense left hemiparesis with flexor tone at knee. sustained clonus left foot. Senses pain in left leg and  arm. LUE   trace. LLE 0-1 on MMT Skin: Skin is warm and dry. Trach site with some drainage, granulation-no air leak   Assessment/Plan: 1. 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 Physiatrist is providing close team supervision and 24 hour management of active medical problems listed below. Physiatrist and rehab team continue to assess barriers to discharge/monitor patient progress toward functional and medical goals. FIM: FIM - Bathing Bathing Steps Patient Completed: Chest;Abdomen;Right upper leg;Left upper leg Bathing: 1: Total-Patient completes 0-2 of 10 parts or less than 25%  FIM - Upper Body Dressing/Undressing Upper body dressing/undressing steps patient completed: Thread/unthread right sleeve of pullover shirt/dresss Upper body dressing/undressing: 0: Wears gown/pajamas-no public clothing FIM - Lower Body Dressing/Undressing Lower body dressing/undressing steps patient completed: Don/Doff right sock;Don/Doff left sock Lower body dressing/undressing: 0: Wears gown/pajamas-no public clothing  FIM - Toileting Toileting: 0: Activity did not occur  FIM - Archivist Transfers: 0-Activity did not occur  FIM - Banker Devices: Sliding board Bed/Chair Transfer: 1: Mechanical lift  FIM - Locomotion: Wheelchair Locomotion: Wheelchair: 2: Travels 50 - 149 ft with maximal assistance (Pt: 25 - 49%) FIM - Locomotion: Ambulation Locomotion: Ambulation: 0: Activity did not occur  Comprehension Comprehension Mode: Auditory Comprehension: 3-Understands basic 50 - 74% of the time/requires cueing 25 - 50%  of the time  Expression Expression Mode: Verbal Expression Assistive Devices: 6-Talk trach valve Expression: 4-Expresses basic 75 - 89% of the time/requires cueing  10 - 24% of  the time. Needs helper to occlude trach/needs to repeat words.  Social Interaction Social Interaction: 3-Interacts appropriately 50 - 74% of the time - May be physically or verbally inappropriate.  Problem Solving Problem Solving: 3-Solves basic 50 - 74% of the time/requires cueing 25 - 49% of the time  Memory Memory: 2-Recognizes or recalls 25 - 49% of the time/requires cueing 51 - 75% of the time  Medical Problem List and Plan:  1. DVT Prophylaxis/Anticoagulation: Pharmaceutical: Lovenox  2. Pain Management:    baclofen for spasticity and voltaren gel for left knee pain.  3. Mood: No signs of distress noted.  4. Neuropsych: This patient is is not capable of making decisions on his/her own behalf.  5. HTN: Monitor with bid checks. continue metoprolol bid. Improvement overall 6. ABLA:  .   iron supplement if tolerating new diet without recurrent N/V  -labs improving 7. Tone:  baclofen trial has helped. PRAFO for LLW 8. Pulmonary:doing well s/p self-decannulation  -needs to keep dressing on but frequently pulls at it 9. FEN: eating fairly well  -PEG needs to stay in 4-6 weeks LOS (Days) 10 A FACE TO FACE EVALUATION WAS PERFORMED  SWARTZ,ZACHARY T 11/01/2011, 7:54 AM

## 2011-11-01 NOTE — Progress Notes (Signed)
Occupational Therapy Session Note  Patient Details  Name: Jeremy [name] MRN: [id number] Date of Birth: May 17, 1965  Today's Date: 11/01/2011 Time: 4098-1191 Time Calculation (min): 20 min  Skilled Therapeutic Interventions/Progress Updates:    Pt seen in diners club today with focus on attention to task, attention to L & sitting balance. Pt required frequent vc's to right himself to midline as he was noted to lean to right often. Pt continues to require assistance for opening packages, set up etc. Neuro muscular re-ed to attend to L side and to slow down during eating.  Therapy Documentation Precautions:  Precautions Precautions: Fall Precaution Comments: left hemiparesis Restrictions Weight Bearing Restrictions: No     Pain: Pain Assessment Pain Assessment: No/denies pain       See FIM for current functional status  Therapy/Group: Group Therapy  Jeremy [name], Jeremy [name] 11/01/2011, 12:46 PM

## 2011-11-01 NOTE — Progress Notes (Signed)
Speech Language Pathology Daily Session Note  Patient Details  Name: Jeremy [name] MRN: [id number] Date of Birth: 1966/02/07  Today's Date: 11/01/2011 Time: 1400-1430 Time Calculation (min): 30 min  Short Term Goals: Week 2: SLP Short Term Goal 1 (Week 2): Pt will demonstrate functional problem solving with basic and familiar tasks with min A verbal cues.  SLP Short Term Goal 2 (Week 2): Pt will initiate functional tasks with supervision verbal and tactile cues.  SLP Short Term Goal 3 (Week 2): Pt will attend to left enviornment and body with Mod A verbal and question cues.  SLP Short Term Goal 4 (Week 2): pt will utilize increased vocal intensity with  Min A verbal cues at the phrase level SLP Short Term Goal 5 (Week 2): Pt will utilize swallowing compensatory strategies with supervision verbal cues.  SLP Short Term Goal 5 - Progress (Week 2): Progressing toward goal SLP Short Term Goal 6 (Week 2): Pt will sustain attention to functional task for 5 minutes with Max A verbal cues for redirection   Skilled Therapeutic Interventions: Treatment focus on working memory and sustained attention. Pt was asleep in bed at beginning of session and required verbal and tactile cues for arousal. Pt required Mod questioning cues to recall new, daily information and report details from morning therapy sessions. Pt required max A verbal and question cues to sustain attention to functional conversation. Pt utilizing increased vocal intensity with 90% of functional conversation and was 90% intelligible at the sentence level .   FIM:  Comprehension Comprehension Mode: Auditory Comprehension: 3-Understands basic 50 - 74% of the time/requires cueing 25 - 50%  of the time Expression Expression Mode: Verbal Expression: 4-Expresses basic 75 - 89% of the time/requires cueing 10 - 24% of the time. Needs helper to occlude trach/needs to repeat words. Social Interaction Social Interaction: 3-Interacts appropriately  50 - 74% of the time - May be physically or verbally inappropriate. Problem Solving Problem Solving: 2-Solves basic 25 - 49% of the time - needs direction more than half the time to initiate, plan or complete simple activities Memory Memory: 2-Recognizes or recalls 25 - 49% of the time/requires cueing 51 - 75% of the time FIM - Eating Eating Activity: 5: Supervision/cues;5: Set-up assist for open containers;5: Needs verbal cues/supervision  Pain Pain Assessment Pain Assessment: No/denies pain  Therapy/Group: Individual Therapy  Wiliam Cauthorn 11/01/2011, 4:13 PM

## 2011-11-01 NOTE — Progress Notes (Addendum)
Occupational Therapy Session Note  Patient Details  Name: Jeremy [name] MRN: [id number] Date of Birth: 1965/08/24  Today's Date: 11/01/2011 Time: 0804-0900 Time Calculation (min): 56 min   Skilled Therapeutic Interventions/Progress Updates:    Worked on bed mobility for placing new undergarment protector.  Pt able to roll to the left with mod assist and max instructional cueing.  Needed total assist to roll to the right side.  Pt able to transition to sitting from the right side with total assist as well.  Able to sit statically once positioned all the way in sitting with close supervision.  Total assist for reciprical scooting forward to EOB.  Attempted to scoot up toward the top of the bed as well, but unable to lift his bottom even with therapist assisting.  Sat EOB for 15 mins while working on balance and LUE shoulder activation.  Noted only trace shoulder movements at this time.  Returned to supine for positioning of sling for transfer to bed using maxi sky.  Therapy Documentation Precautions:  Precautions Precautions: Fall Precaution Comments: left hemiparesis Restrictions Weight Bearing Restrictions: No  Vital Signs: Therapy Vitals Temp: 97.8 F (36.6 C) Temp src: Oral Pulse Rate: 97  Resp: 20  BP: 168/98 mmHg Patient Position, if appropriate: Lying Oxygen Therapy SpO2: 100 % O2 Device: None (Room air) Pain: Pain Assessment Pain Assessment: 0-10 Pain Score:   3 Pain Type: Acute pain Pain Location: Ankle Pain Orientation: Right Pain Intervention(s): Medication (See eMAR) ADL: See FIM for current functional status  Therapy/Group: Individual Therapy  Iyanna Drummer OTR/L  11/01/2011, 9:46 AM

## 2011-11-01 NOTE — Progress Notes (Signed)
Physical Therapy Session Note  Patient Details  Name: Jeremy [name] MRN: [id number] Date of Birth: 09-Feb-1966  Today's Date: 11/01/2011 Time: 2841-3244 Time Calculation (min): 60 min  Short Term Goals: Week 2:     Skilled Therapeutic Interventions/Progress Updates:  Patient in wheelchair when entered room. Patient to gym attempted standing at parallel bars three times with +2 maximal assistance. Patient was able to stand only once and maintained about 20 seconds. Patient lifted to mat to work on dynamic sitting balance while reaching and throwing horseshoes. Patient able to maintain sitting with distant supervision. Treatment concentrated on sit to stand from mat using 3 muskateer method. Patient requires +2 assist for sit to stand and facilitation of left hip and knee extension during stance. Patient is able to occasionally activate hip extension but basically stands with flexed hips. Patient stood three times for about 45 seconds, 1 minute, and 30 seconds.  Therapy Documentation Precautions:  Precautions Precautions: Fall Precaution Comments: left hemiparesis Restrictions Weight Bearing Restrictions: No  Pain: Pain Assessment Pain Assessment: No/denies pain Patient did express some discomfort in right ankle. "I broke that ankle about 15 years ago."  See FIM for current functional status  Therapy/Group: Individual Therapy  Arelia Longest M 11/01/2011, 11:18 AM

## 2011-11-01 NOTE — Plan of Care (Signed)
Problem: Consults Goal: RH STROKE PATIENT EDUCATION Patient and wife will be able to verbalize understanding of hyperlipidemia , hypertension and what meds to take at home with min assist .  Outcome: Not Applicable Date Met:  11/01/11 Wife has not been available for education and patient memory poor at this time . Continue to reinforce education .

## 2011-11-02 ENCOUNTER — Inpatient Hospital Stay (HOSPITAL_COMMUNITY): Payer: BC Managed Care – PPO | Admitting: Speech Pathology

## 2011-11-02 ENCOUNTER — Inpatient Hospital Stay (HOSPITAL_COMMUNITY): Payer: BC Managed Care – PPO

## 2011-11-02 ENCOUNTER — Inpatient Hospital Stay (HOSPITAL_COMMUNITY): Payer: BC Managed Care – PPO | Admitting: Occupational Therapy

## 2011-11-02 MED ORDER — BACLOFEN 10 MG PO TABS
10.0000 mg | ORAL_TABLET | Freq: Four times a day (QID) | ORAL | Status: DC
  Administered 2011-11-02 – 2011-11-16 (×54): 10 mg via ORAL
  Filled 2011-11-02 (×61): qty 1

## 2011-11-02 NOTE — Progress Notes (Signed)
Physical Therapy Session Note  Patient Details  Name: Jeremy [name] MRN: [id number] Date of Birth: 01-Jan-1966  Today's Date: 11/02/2011 Time: 1400-1453 Time Calculation (min): 53 min  Short Term Goals: Week 2:     Skilled Therapeutic Interventions/Progress Updates:  Patient transferred with lift from bed to wheelchair. Patient propelled wheelchair on level tile surfaces using right extremities. Patient needs constant cueing to use right LE to steer wheelchair and mod assist to go avoid obstacles and make turns. Patient transferred wheelchair to and from mat with sliding board and maximum assistance of 1 using sliding board always going to right side. Therapist had +2 standby assist to assure chair did not move. Patient sit to right side lying with max assist to lift legs. Patient right side lying to sit with moderate assist with left LE. Patient transferred from wheelchair to hospital bed (slight incline) using sliding board and max assist. Patient required +2 assist to be positioned in bed.   Therapy Documentation Precautions:  Precautions Precautions: Fall Precaution Comments: left hemiparesis Restrictions Weight Bearing Restrictions: No Pain: Pain Assessment Pain Assessment: No/denies pain Patient occasionally complains of joint pain with mobility.  See FIM for current functional status  Therapy/Group: Individual Therapy  [name] 11/02/2011, 4:57 PM

## 2011-11-02 NOTE — Patient Care Conference (Signed)
Inpatient RehabilitationTeam Conference Note Date: 11/04/2011   Time: 3:00 PM    Patient Name: Jeremy [name]      Medical Record Number: [id number]  Date of Birth: 02/28/1965 Sex: Male         Room/Bed: 4001/4001-02 Payor Info: Payor: BLUE CROSS BLUE SHIELD  Plan: [facility] HE[employer] PPO  Product Type: *No Product type*     Admitting Diagnosis: ICH,TRACH  Admit Date/Time:  10/22/2011  4:28 PM Admission Comments: No comment available   Primary Diagnosis:  ICH (intracerebral hemorrhage) Principal Problem: ICH (intracerebral hemorrhage)  Patient Active Problem List   Diagnosis Date Noted  . Spasticity 10/29/2011  . 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 Dysphagia, PEG dependent 10/20/2011  . Fever 10/16/2011  . Cytotoxic cerebral edema 10/02/2011  . Obstructive hydrocephalus 10/02/2011  . ICH (intracerebral hemorrhage) 10/01/2011  . HTN (hypertension) 10/01/2011    Expected Discharge Date: Expected Discharge Date:  (will recommend SNF to wife due to very minimal gains)  Team Members Present: Physician: Dr. Faith Rogue Social Worker Present: Amada Jupiter, LCSW Nurse Present: Chana Bode, RN;Other (comment) (Melissa Ramgeet, RN) PT Present: Reggy Eye, PT OT Present: Mackie Pai, OT SLP Present: Feliberto Gottron, SLP     Current Status/Progress Goal Weekly Team Focus  Medical   improve cognition, tone an issue. trach out  pain and tone control  see above   Bowel/Bladder   incontinent bowel and bladder wears brief condom cath at hs toileting every 3 hours  lbm 9-16 constipation dulcolax suppository 9-16   managed mod assist   monitor bowel and bladder status    Swallow/Nutrition/ Hydration   Regular textures and thin liquids, intermittent supervision for utilization of swallowing compensatory strategies.   Supervision  Goal Met   ADL's   total assist +2 / max assist, improving ability to transition to stand with +2 assist.  Limited by pain, decreased attention  minimal  assistance  level surface transfers   Mobility   Continue to use lift for transfers. Patient standing with +2 maximal assist for about 30 to 60 minutes each time. Distant supervision for sitting balance.   min-mod A except S w/c propulsion   wheelchair propulsion in regular wheelchair; transfer to right side using sliding board.   Communication   Mod A  Supervision-Mod I  increased vocal intensity, expression of wants/needs, comprehension of mildly complex information   Safety/Cognition/ Behavioral Observations  Mod-max A  Min A  attention, awareness, problem solving, working memory    Pain   oxycodone 5 -10 mgpo for right ankle pain  voltaren gel to right knee   less than 3   monitor effectiveness of pain meds    Skin   peg site noted with small amt yellow drainage at times dsg intact   no new breakdown   educate on skin care       *See Interdisciplinary Assessment and Plan and progress notes for long and short-term goals  Barriers to Discharge: see prior    Possible Resolutions to Barriers:  see prio    Discharge Planning/Teaching Needs:  Plan home with wife to provide 24/7 assistance vs SNF      Team Discussion:  Pt making very slow gains and will need to downgrade goals with anticipation that he will not progress beyond mod - max assistance which will likely be too much for wife to manage.  SW to discuss with her.  Revisions to Treatment Plan:  Anticipate change in d/c plan to SNF   Continued  Need for Acute Rehabilitation Level of Care: The patient requires daily medical management by a physician with specialized training in physical medicine and rehabilitation for the following conditions: Daily medical management of patient stability for increased activity during participation in an intensive rehabilitation regime.: Yes Daily analysis of laboratory values and/or radiology reports with any subsequent need for medication adjustment of medical intervention for : Post surgical  problems;Neurological problems;Other  Jeremy [name] 11/04/2011, 10:14 AM

## 2011-11-02 NOTE — Progress Notes (Signed)
Subjective/Complaints: Comfortable this am A 12 point review of systems has been performed and if not noted above is otherwise negative.   Objective: Vital Signs: Blood pressure 126/80, pulse 92, temperature 97.5 F (36.4 C), temperature source Oral, resp. rate 19, weight 116.529 kg (256 lb 14.4 oz), SpO2 100.00%. No results found. No results found for this basename: WBC:2,HGB:2,HCT:2,PLT:2 in the last 72 hours No results found for this basename: NA:2,K:2,CL:2,CO2:2,GLUCOSE:2,BUN:2,CREATININE:2,CALCIUM:2 in the last 72 hours CBG (last 3)  No results found for this basename: GLUCAP:3 in the last 72 hours  Wt Readings from Last 3 Encounters:  10/27/11 116.529 kg (256 lb 14.4 oz)  10/18/11 121.1 kg (266 lb 15.6 oz)  10/18/11 121.1 kg (266 lb 15.6 oz)    Physical Exam:  Nursing note and vitals reviewed.  Constitutional: He is oriented to person, place, and time. He appears well-developed and well-nourished.  Morbidly obese male  .   HENT:  Head: Normocephalic and atraumatic.   Marland Kitchen Poor dentition. Missing multiple teeth. Vocal quality much improved. Eyes: Pupils are equal, round, and reactive to light.  Unable to close left eye. Dysconjugate gaze, exopthalmus. Cannot gaze upward Neck:  trach Wound with some debris.   Cardiovascular: Normal rate and regular rhythm.  Pulmonary/Chest: Effort normal.   No distress. No cough. clear Abdominal: Soft. Bowel sounds are normal. He exhibits no distension. There is no tenderness.  PEG site clean and dry.  Musculoskeletal: He exhibits tenderness (left knee with ROM.).  Neurological: He is alert and oriented to person, place, and time. He displays abnormal reflex.  Able to state DOB, current month but not the date. Polite and appropriate. Follows basic commands without difficulty.  . Left facial weakness. Unable to look upward. Dense left hemiparesis with flexor tone at knee.   clonus left foot. Senses pain in left leg and arm. LUE   trace. LLE 0-1  on MMT Skin: Skin is warm and dry. Trach site pink granulation tissue  Assessment/Plan: 1. 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 Physiatrist is providing close team supervision and 24 hour management of active medical problems listed below. Physiatrist and rehab team continue to assess barriers to discharge/monitor patient progress toward functional and medical goals. FIM: FIM - Bathing Bathing Steps Patient Completed: Chest;Abdomen;Right upper leg;Left upper leg Bathing: 1: Total-Patient completes 0-2 of 10 parts or less than 25%  FIM - Upper Body Dressing/Undressing Upper body dressing/undressing steps patient completed: Thread/unthread right sleeve of pullover shirt/dresss Upper body dressing/undressing: 0: Wears gown/pajamas-no public clothing FIM - Lower Body Dressing/Undressing Lower body dressing/undressing steps patient completed: Don/Doff right sock;Don/Doff left sock Lower body dressing/undressing: 0: Wears gown/pajamas-no public clothing  FIM - Toileting Toileting: 0: Activity did not occur  FIM - Archivist Transfers: 0-Activity did not occur  FIM - Banker Devices: Sliding board Bed/Chair Transfer: 1: Two helpers;1: Mechanical lift  FIM - Locomotion: Wheelchair Locomotion: Wheelchair: 1: Total Assistance/staff pushes wheelchair (Pt<25%) FIM - Locomotion: Ambulation Locomotion: Ambulation: 0: Activity did not occur  Comprehension Comprehension Mode: Auditory Comprehension: 3-Understands basic 50 - 74% of the time/requires cueing 25 - 50%  of the time  Expression Expression Mode: Verbal Expression Assistive Devices: 6-Talk trach valve Expression: 4-Expresses basic 75 - 89% of the time/requires cueing 10 - 24% of the time. Needs helper to  occlude trach/needs to repeat words.  Social Interaction Social Interaction: 3-Interacts appropriately 50 - 74% of the time - May be physically or verbally inappropriate.  Problem Solving Problem Solving: 1-Solves basic less than 25% of the time - needs direction nearly all the time or does not effectively solve problems and may need a restraint for safety  Memory Memory: 1-Recognizes or recalls less than 25% of the time/requires cueing greater than 75% of the time  Medical Problem List and Plan:  1. DVT Prophylaxis/Anticoagulation: Pharmaceutical: Lovenox  2. Pain Management:    baclofen for spasticity and voltaren gel for left knee pain.  3. Mood: No signs of distress noted.  4. Neuropsych: This patient is is not capable of making decisions on his/her own behalf.  5. HTN: Monitor with bid checks. continue metoprolol bid. Improvement overall 6. ABLA:  .   iron supplement if tolerating new diet without recurrent N/V  -labs improving 7. Tone:  baclofen trial has helped.--increase to qid.  PRAFO for LLW 8. Pulmonary:doing well s/p self-decannulation  -needs to keep dressing on but frequently pulls at it 9. FEN: eating fairly well  -PEG needs to stay in 4-6 weeks LOS (Days) 11 A FACE TO FACE EVALUATION WAS PERFORMED  Jeremy [name] 11/02/2011, 6:39 AM

## 2011-11-02 NOTE — Progress Notes (Signed)
Occupational Therapy Session Note  Patient Details  Name: Jeremy [name] MRN: [id number] Date of Birth: Mar 26, 1965  Today's Date: 11/02/2011 Time: 1100-1151 Time Calculation (min): 51 min  Short Term Goals: Week 2:  OT Short Term Goal 1 (Week 2): Patient will transfer weight forward in sitting with mod assist in preparation for sit to stand OT Short Term Goal 2 (Week 2): Patient will utilize grab bar effectively to transition from sit to squat for LB bathing with max assist OT Short Term Goal 3 (Week 2): Patient will sustain attention to grooming or meal task for more than 5 min with min prompting OT Short Term Goal 4 (Week 2): Patient will effectively utilize right arm and leg to roll to left with min assist to aide with bathing   Skilled Therapeutic Interventions/Progress Updates:    Patient's wife present for ADL session this am, and was helpful in managing the wheelchair during various transitions.  Patient able to pull to squat on one occasion, but unable to replicate on subsequent trials.  Patient highly distracted internally (pain, toileting) and has difficulty following physical cues.    Therapy Documentation Precautions:  Precautions Precautions: Fall Precaution Comments: left hemiparesis Restrictions Weight Bearing Restrictions: No   Pain: Left leg, hip and calf See FIM for current functional status  Therapy/Group: Individual Therapy  Collier Salina 11/02/2011, 11:54 AM

## 2011-11-02 NOTE — Progress Notes (Signed)
Speech Language Pathology Daily Session Note  Patient Details  Name: Jeremy [name] MRN: [id number] Date of Birth: Dec 25, 1965  Today's Date: 11/02/2011 Time: 0930-1030 Time Calculation (min): 60 min  Short Term Goals: Week 2: SLP Short Term Goal 1 (Week 2): Pt will demonstrate functional problem solving with basic and familiar tasks with min A verbal cues.  SLP Short Term Goal 2 (Week 2): Pt will initiate functional tasks with supervision verbal and tactile cues.  SLP Short Term Goal 3 (Week 2): Pt will attend to left enviornment and body with Mod A verbal and question cues.  SLP Short Term Goal 4 (Week 2): pt will utilize increased vocal intensity with  Min A verbal cues at the phrase level SLP Short Term Goal 5 (Week 2): Pt will utilize swallowing compensatory strategies with supervision verbal cues.  SLP Short Term Goal 5 - Progress (Week 2): Progressing toward goal SLP Short Term Goal 6 (Week 2): Pt will sustain attention to functional task for 5 minutes with Max A verbal cues for redirection   Skilled Therapeutic Interventions: Treatment focus on working memory, selective attention, initiation and verbal expression. Pt required Mod verbal cues to utilize increased vocal intensity at the word and phrase level. Pt demonstrated selective attention to card game in a moderately distracting environment with Min A question cues for redirection.  Pt demonstrated appropriate recall to rules of game independently.  Pt required Max verbal cues for verbal problem solving/reasoning for staying in chair until next therapy session.    FIM:  Comprehension Comprehension Mode: Auditory Comprehension: 3-Understands basic 50 - 74% of the time/requires cueing 25 - 50%  of the time Expression Expression Mode: Verbal Expression: 2-Expresses basic 25 - 49% of the time/requires cueing 50 - 75% of the time. Uses single words/gestures. Social Interaction Social Interaction: 3-Interacts appropriately 50 - 74%  of the time - May be physically or verbally inappropriate. Problem Solving Problem Solving: 1-Solves basic less than 25% of the time - needs direction nearly all the time or does not effectively solve problems and may need a restraint for safety Memory Memory: 2-Recognizes or recalls 25 - 49% of the time/requires cueing 51 - 75% of the time  Pain Pain Assessment Pain Assessment: No/denies pain  Therapy/Group: Individual Therapy  Waylen Depaolo 11/02/2011, 11:54 AM

## 2011-11-03 ENCOUNTER — Inpatient Hospital Stay (HOSPITAL_COMMUNITY): Payer: BC Managed Care – PPO | Admitting: Occupational Therapy

## 2011-11-03 ENCOUNTER — Inpatient Hospital Stay (HOSPITAL_COMMUNITY): Payer: BC Managed Care – PPO | Admitting: Speech Pathology

## 2011-11-03 ENCOUNTER — Inpatient Hospital Stay (HOSPITAL_COMMUNITY): Payer: BC Managed Care – PPO

## 2011-11-03 DIAGNOSIS — I10 Essential (primary) hypertension: Secondary | ICD-10-CM

## 2011-11-03 DIAGNOSIS — I619 Nontraumatic intracerebral hemorrhage, unspecified: Secondary | ICD-10-CM

## 2011-11-03 DIAGNOSIS — J9600 Acute respiratory failure, unspecified whether with hypoxia or hypercapnia: Secondary | ICD-10-CM

## 2011-11-03 DIAGNOSIS — Z5189 Encounter for other specified aftercare: Secondary | ICD-10-CM

## 2011-11-03 NOTE — Progress Notes (Signed)
Physical Therapy Weekly Progress Note  Patient Details  Name: Jeremy [name] MRN: [id number] Date of Birth: Apr 19, 1965  Today's Date: 11/03/2011 Time: 1400-1450 Time Calculation (min): 50 min  Patient has met 2 of 4 short term goals.  Patient has made great progress with transfers. Patient has been changed from the tilt in space to a regular wheelchair making propulsion easier. Patient continues to have decreased attention and requires total assistance to stand for 1 minute. Patient needs constant verbal and physical cueing for hip extension during standing.  Patient continues to demonstrate the following deficits: decreased attention, apraxia, dense hemiplegia and therefore will continue to benefit from skilled PT intervention to enhance overall performance with activity tolerance, balance, postural control, functional use of  left upper extremity and left lower extremity and attention.  Patient progressing toward long term goals..  Continue plan of care.  PT Short Term Goals Week 2:  PT Short Term Goal 1 (Week 2): Patient will perform bed mobility with max A and max verbal cues for initiation and sequencing  PT Short Term Goal 2 (Week 2): Patient will transfer wheelchair to bed and mat with sliding board and max assistance of 1.  PT Short Term Goal 3 (Week 2): Patient will propell wheelchair 100 feet on level tile surfaces in controlled environment using right extremities with supervision.  PT Short Term Goal 4 (Week 2): Patient will stand with UE support and max assist for 2 minutes to assist with caregiver.  Skilled Therapeutic Interventions/Progress Updates:  Patient supine to sit with moderate assistance using hospital bed raised and bed rails. Patient transferred to wheelchair with sliding board and +1 max assist and +1 assist to hold wheelchair. Patient stood at parallel bars for 1 minute x3 with total assist for hip and knee extension on left. Patient practiced transfers wheelchair to  and from mat using sliding board and going to left (weak side). Patient required +2 assist but was able to assist with lifting and scooting to left. Patient propelled wheelchair on level tile surfaces in controlled environment using right extremities 120 feet. Patient needs shoe on right foot to increase traction with propulsion.  Therapy Documentation Precautions:  Precautions Precautions: Fall Precaution Comments: left hemiparesis Restrictions Weight Bearing Restrictions: No  Pain: Pain Assessment Pain Assessment: No/denies pain  Locomotion : Wheelchair Mobility Distance: 120 fet   See FIM for current functional status  Therapy/Group: Individual Therapy  [name] 11/03/2011, 4:28 PM

## 2011-11-03 NOTE — Progress Notes (Signed)
Speech Language Pathology Daily Session Note  Patient Details  Name: Jeremy [name] MRN: [id number] Date of Birth: 04-13-1965  Today's Date: 11/03/2011 Time: 4098-1191 Time Calculation (min): 40 min  Short Term Goals: Week 2: SLP Short Term Goal 1 (Week 2): Pt will demonstrate functional problem solving with basic and familiar tasks with min A verbal cues.  SLP Short Term Goal 2 (Week 2): Pt will initiate functional tasks with supervision verbal and tactile cues.  SLP Short Term Goal 3 (Week 2): Pt will attend to left enviornment and body with Mod A verbal and question cues.  SLP Short Term Goal 4 (Week 2): pt will utilize increased vocal intensity with  Min A verbal cues at the phrase level SLP Short Term Goal 5 (Week 2): Pt will utilize swallowing compensatory strategies with supervision verbal cues.  SLP Short Term Goal 5 - Progress (Week 2): Progressing toward goal SLP Short Term Goal 6 (Week 2): Pt will sustain attention to functional task for 5 minutes with Max A verbal cues for redirection   Skilled Therapeutic Interventions: Treatment focus on written expression, decision making, attention. Pt demonstrated perseveration during written expression of biographical information and required total A to self-monitor errors but was able to correct them independently.  Pt Mod I for decision making for meal choices and required Mod verbal cues for increased vocal intensity at the phrase level. Pt overall Mod I for organization/recall of ingredients for basic recipe he would like to make in the kitchen later this week. Pt demonstrated selective attention to task in a mildly distracting environment with Mod verbal cues for redirection.    FIM:  Comprehension Comprehension Mode: Auditory Comprehension: 4-Understands basic 75 - 89% of the time/requires cueing 10 - 24% of the time Expression Expression Mode: Verbal Expression: 3-Expresses basic 50 - 74% of the time/requires cueing 25 - 50% of the  time. Needs to repeat parts of sentences. Social Interaction Social Interaction: 3-Interacts appropriately 50 - 74% of the time - May be physically or verbally inappropriate. Problem Solving Problem Solving: 2-Solves basic 25 - 49% of the time - needs direction more than half the time to initiate, plan or complete simple activities Memory Memory: 2-Recognizes or recalls 25 - 49% of the time/requires cueing 51 - 75% of the time  Pain Pain Assessment Pain Assessment: No/denies pain Pain Score: 0-No pain  Therapy/Group: Individual Therapy  Armonii Sieh 11/03/2011, 12:10 PM

## 2011-11-03 NOTE — Progress Notes (Signed)
11/03/11 Patient's Bp 144/101 Pulse 85 at 0600. Offered to give blood pressure medication early. Patient refused treatment. Reported blood pressure to on coming day nurse for close monitoring. A. Tekelia Kareem,LPN.

## 2011-11-03 NOTE — Progress Notes (Signed)
Occupational Therapy Session Note  Patient Details  Name: Jeremy [name] MRN: [id number] Date of Birth: 1965-09-04  Today's Date: 11/03/2011 Time: 1191-4782 Time Calculation (min): 45 min  Short Term Goals: Week 2:  OT Short Term Goal 1 (Week 2): Patient will transfer weight forward in sitting with mod assist in preparation for sit to stand OT Short Term Goal 2 (Week 2): Patient will utilize grab bar effectively to transition from sit to squat for LB bathing with max assist OT Short Term Goal 3 (Week 2): Patient will sustain attention to grooming or meal task for more than 5 min with min prompting OT Short Term Goal 4 (Week 2): Patient will effectively utilize right arm and leg to roll to left with min assist to aide with bathing   Skilled Therapeutic Interventions/Progress Updates:    Patient participated in 1:1 OT session to address basic self care skills.  Patient transferred via sliding board to roll in shower chair.  Patient able to use shower chair with cut out as commode.  Patient very excited to shower as his hygiene is very motivating to him.  Patient better able to follow commands with this meaningful task.  Patient transferred via sky lift back to bed to ensure adequate cleaning after BM, and assisted into clothing utilizing active rolling.    Therapy Documentation Precautions:  Precautions Precautions: Fall Precaution Comments: left hemiparesis Restrictions Weight Bearing Restrictions: No   Pain: Left leg with passive movement or handling See FIM for current functional status  Therapy/Group: Individual Therapy  Collier Salina 11/03/2011, 2:07 PM

## 2011-11-03 NOTE — Progress Notes (Signed)
Subjective/Complaints: Comfortable this am. "when is breakfast?" A 12 point review of systems has been performed and if not noted above is otherwise negative.   Objective: Vital Signs: Blood pressure 151/101, pulse 73, temperature 97.9 F (36.6 C), temperature source Oral, resp. rate 17, weight 120.1 kg (264 lb 12.4 oz), SpO2 100.00%. No results found. No results found for this basename: WBC:2,HGB:2,HCT:2,PLT:2 in the last 72 hours No results found for this basename: NA:2,K:2,CL:2,CO2:2,GLUCOSE:2,BUN:2,CREATININE:2,CALCIUM:2 in the last 72 hours CBG (last 3)  No results found for this basename: GLUCAP:3 in the last 72 hours  Wt Readings from Last 3 Encounters:  11/03/11 120.1 kg (264 lb 12.4 oz)  10/18/11 121.1 kg (266 lb 15.6 oz)  10/18/11 121.1 kg (266 lb 15.6 oz)    Physical Exam:  Nursing note and vitals reviewed.  Constitutional: He is oriented to person, place, and time. He appears well-developed and well-nourished.  Morbidly obese male  .   HENT:  Head: Normocephalic and atraumatic.   Marland Kitchen Poor dentition. Missing multiple teeth. Vocal quality much improved. Eyes: Pupils are equal, round, and reactive to light.  Unable to close left eye. Dysconjugate gaze, exopthalmus. Cannot gaze upward Neck:  trach Wound with some debris.   Cardiovascular: Normal rate and regular rhythm.  Pulmonary/Chest: Effort normal.   No distress. No cough. clear Abdominal: Soft. Bowel sounds are normal. He exhibits no distension. There is no tenderness.  PEG site clean and dry.  Musculoskeletal: He exhibits tenderness (left knee with ROM.).  Neurological: He is alert and oriented to person, place, and time. He displays abnormal reflex.  Able to state DOB, current month but not the date. Polite and appropriate. Follows basic commands without difficulty.  . Left facial weakness. Unable to look upward. Dense left hemiparesis with flexor tone at knee.   clonus left foot 4 beats. Senses pain in left leg  and arm. LUE   trace. LLE 0-1 on MMT. Left arm and leg are easily moveable Skin: Skin is warm and dry. Trach site pink granulation tissue  Assessment/Plan: 1. 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 Physiatrist is providing close team supervision and 24 hour management of active medical problems listed below. Physiatrist and rehab team continue to assess barriers to discharge/monitor patient progress toward functional and medical goals. FIM: FIM - Bathing Bathing Steps Patient Completed: Chest;Abdomen;Right upper leg;Left upper leg Bathing: 1: Total-Patient completes 0-2 of 10 parts or less than 25%  FIM - Upper Body Dressing/Undressing Upper body dressing/undressing steps patient completed: Thread/unthread right sleeve of pullover shirt/dresss;Put head through opening of pull over shirt/dress Upper body dressing/undressing: 3: Mod-Patient completed 50-74% of tasks FIM - Lower Body Dressing/Undressing Lower body dressing/undressing steps patient completed: Thread/unthread right pants leg Lower body dressing/undressing: 1: Total-Patient completed less than 25% of tasks  FIM - Toileting Toileting: 0: Activity did not occur  FIM - Archivist Transfers: 0-Activity did not occur  FIM - Architectural technologist Transfer: 2: Chair or W/C > Bed: Max A (lift and lower assist);2: Bed > Chair or W/C: Max A (lift and lower assist)  FIM - Locomotion: Wheelchair Locomotion: Wheelchair: 1: Travels less than 50 ft with moderate assistance (Pt: 50 - 74%) FIM - Locomotion: Ambulation Locomotion: Ambulation: 0: Activity did not occur  Comprehension Comprehension Mode: Auditory Comprehension: 3-Understands basic 50 - 74% of  the time/requires cueing 25 - 50%  of  the time  Expression Expression Mode: Verbal Expression Assistive Devices: 6-Talk trach valve Expression: 2-Expresses basic 25 - 49% of the time/requires cueing 50 - 75% of the time. Uses single words/gestures.  Social Interaction Social Interaction: 3-Interacts appropriately 50 - 74% of the time - May be physically or verbally inappropriate.  Problem Solving Problem Solving: 1-Solves basic less than 25% of the time - needs direction nearly all the time or does not effectively solve problems and may need a restraint for safety  Memory Memory: 2-Recognizes or recalls 25 - 49% of the time/requires cueing 51 - 75% of the time  Medical Problem List and Plan:  1. DVT Prophylaxis/Anticoagulation: Pharmaceutical: Lovenox  2. Pain Management:    baclofen for spasticity and voltaren gel for left knee pain.  3. Mood: No signs of distress noted.  4. Neuropsych: This patient is is not capable of making decisions on his/her own behalf.  5. HTN: Monitor with bid checks. continue metoprolol bid. Improvement overall 6. ABLA:  .   iron supplement if tolerating new diet without recurrent N/V  -labs improving 7. Tone:  baclofen trial has helped.--increased to qid.  PRAFO for LLW 8. Pulmonary:doing well s/p self-decannulation  -needs to keep dressing on but frequently pulls at it 9. FEN: eating fairly well  -PEG needs to stay in 4-6 weeks total LOS (Days) 12 A FACE TO FACE EVALUATION WAS PERFORMED  SWARTZ,ZACHARY T 11/03/2011, 7:10 AM

## 2011-11-03 NOTE — Progress Notes (Signed)
Occupational Therapy Session Note  Patient Details  Name: Jeremy [name] MRN: [id number] Date of Birth: Aug 02, 1965  Today's Date: 11/03/2011 Time: 2952-8413 Time Calculation (min): 40 min  Short Term Goals: Week 2:  OT Short Term Goal 1 (Week 2): Patient will transfer weight forward in sitting with mod assist in preparation for sit to stand OT Short Term Goal 2 (Week 2): Patient will utilize grab bar effectively to transition from sit to squat for LB bathing with max assist OT Short Term Goal 3 (Week 2): Patient will sustain attention to grooming or meal task for more than 5 min with min prompting OT Short Term Goal 4 (Week 2): Patient will effectively utilize right arm and leg to roll to left with min assist to aide with bathing   Skilled Therapeutic Interventions/Progress Updates:  Patient resting in bed upon arrival.  Neuro-muscular reducation of LUE with PROM/AAROM and weight bearing as well as patient  Focus on management of LUE with all mobility.  Patient found to need bed and pants changed from urine accident (condom cath off) therefore forcus on bed mobility with patient assisting with RUE&RLE for undressing and dressing to include assistance from NT to replace the condom cath.    Therapy Documentation Precautions:  Precautions Precautions: Fall Precaution Comments: left hemiparesis Restrictions Weight Bearing Restrictions: No Pain: Left LE pain, "all the way down"  Not rated.  See FIM for current functional status  Therapy/Group: Individual Therapy  Shella Lahman 11/03/2011, 12:22 PM

## 2011-11-04 ENCOUNTER — Inpatient Hospital Stay (HOSPITAL_COMMUNITY): Payer: BC Managed Care – PPO | Admitting: Speech Pathology

## 2011-11-04 ENCOUNTER — Inpatient Hospital Stay (HOSPITAL_COMMUNITY): Payer: BC Managed Care – PPO | Admitting: Physical Therapy

## 2011-11-04 ENCOUNTER — Encounter (HOSPITAL_COMMUNITY): Payer: BC Managed Care – PPO | Admitting: Occupational Therapy

## 2011-11-04 ENCOUNTER — Inpatient Hospital Stay (HOSPITAL_COMMUNITY): Payer: BC Managed Care – PPO | Admitting: *Deleted

## 2011-11-04 ENCOUNTER — Inpatient Hospital Stay (HOSPITAL_COMMUNITY): Payer: BC Managed Care – PPO | Admitting: Occupational Therapy

## 2011-11-04 NOTE — Progress Notes (Signed)
Physical Therapy Session Note  Patient Details  Name: Jeremy [name] MRN: [id number] Date of Birth: 04/30/1965  Today's Date: 11/04/2011 Time: 5621-3086 Time Calculation (min): 42 min   Skilled Therapeutic Interventions/Progress Updates:    See below for details  Therapy Documentation Precautions:  Precautions Precautions: Fall Precaution Comments: left hemiparesis Restrictions Weight Bearing Restrictions: No Pain: No pain reported Mobility:  Sliding board transfer w/c to bed to get pt cleaned with total @+3.  Pt rolling in bed to the L with min@ to the R with max@.  Sliding board transfer bed to w/c with total @+2, pt =60%. Locomotion :  Total @ w/c propulsion on unit by staff.  Other Treatments:   Standing frame x performing reaching to the R, to facilitate getting pt to activate his trunk and stand erect.  Otherwise, pt was lying on the table and therapist unable to facilitate or lift pt up for trunk activation. See FIM for current functional status  Therapy/Group: Individual Therapy  [name] 11/04/2011, 3:36 PM

## 2011-11-04 NOTE — Progress Notes (Signed)
Social Work Patient ID: Stevan Bakken, male   DOB: October 16, 1965, 46 y.o.   MRN: [id number]  Met with patient and wife following team conference to review team report - discussed concern that, while patient is making slow progress, it is anticipated that he will require more care than initially anticipated at d/c.  Team concerns that his assistance needs are too great for wife to provide.  Both patient and wife agree with this and have decided to change d/c plan to SNF.  Have alerted and updated insurance to this change in plan.  FL2 to be sent out this week.  Lewanda Perea

## 2011-11-04 NOTE — Evaluation (Signed)
Recreational Therapy Assessment and Plan  Patient Details  Name: Jeremy [name] MRN: [id number] Date of Birth: 1965/10/15 Today's Date: 11/04/2011  Rehab Potential: Good ELOS: 4 weeks   Assessment Clinical Impression: Problem List:  Patient Active Problem List   Diagnosis   .  ICH (intracerebral hemorrhage)   .  HTN (hypertension)   .  Cytotoxic cerebral edema   .  Obstructive hydrocephalus   .  Fever   .  Tracheostomy dependent   .  Dysphagia, PEG dependent    Past Medical History:  Past Medical History   Diagnosis  Date   .  Hypertension  10/01/2011     basal ganglia   .  Stroke     Past Surgical History:  Past Surgical History   Procedure  Date   .  Tracheostomy tube placement  10/11/2011     Procedure: TRACHEOSTOMY; Surgeon: Christia Reading, MD; Location: [facility]; Service: ENT; Laterality: N/A;    Assessment & Plan  Clinical Impression: Patient is a 46 y.o. male with history of HTN-no medications who was admitted on 10/01/11 with headache, N/V, and difficulty using left hand. CT head revealed R-BG hemorrhage with surrounding edema. Started on nicardipine drip for BP management. Patient developed somnolence due to extension of hemorrhage to right thalamus and posterior third ventricle with concerns for developing obstructive hydrocephalus. Patient intubated. Dr. Jordan Likes consulted and IVC placed. Did require replacement on 08/21 due to malfunction. He had difficulty weaning of vent required trach on 08/26 and gastrostomy tube placed by IVR on 08/28. Dr. Jordan Likes consulted and ventriculostomy drain placed and last removed by patient on 08/29. BP has been labile. He did develop fever 08/30 and was started on antibiotics for treatment. This resolved when CVL discontinued and patient has been off antibiotics. Did have episode of projectile vomiting on 09/04 and KUB done without evidence of obstruction. TF rate adjusted with resolution of symptoms. Trach downsized to CFS #6 yesterday. FEES done  today and regular diet initiated. Patient transferred to CIR on 10/22/2011 .   Patient presents with decreased activity tolerance, decreased functional mobility, decreased balance, left sided weakness, decreased attention, decreased awareness, decreased problem solving limiting pt's independence with leisure/community pursuits .Leisure History/Participation Premorbid leisure interest/current participation: Ashby Dawes - Lawn care;Games - Word-search;Ashby Dawes - Fishing;Community - Public relations account executive (sit on front porch and relax) Expression Interests: Music (Comment) (church) Other Leisure Interests: Cooking/Baking;Television;Videogames;Computer (Pac man) Leisure Participation Style: Alone Biochemist, clinical Resources: Good-identify 3 post discharge leisure resources Psychosocial / Spiritual Spiritual Interests: Church;Womens'Men's Groups (if something is going on at church, i'm there) Stress Management: Good Patient agreeable to Pet Therapy: No Does patient have pets?: No Social interaction - Mood/Behavior: Cooperative Firefighter Appropriate for Education?: Yes Patient Agreeable to Outing?: Yes Recreational Therapy Orientation Orientation -Reviewed with patient: Available activity resources Strengths/Weaknesses Patient Strengths/Abilities: Willingness to participate Patient weaknesses: Physical limitations  Plan Rec Therapy Plan Is patient appropriate for Therapeutic Recreation?: Yes Rehab Potential: Good Treatment times per week: Min 1 time per week > 20 minutes Estimated Length of Stay: 4 weeks TR Treatment/Interventions: Adaptive equipment instruction;1:1 session;Balance/vestibular training;Cognitive remediation/compensation;Community reintegration;Functional mobility training;Patient/family education;Recreation/leisure participation;Therapeutic activities;Visual/perceptual remediation/compensation;UE/LE Coordination activities;Wheelchair  propulsion/positioning  Recommendations for other services: None  Discharge Criteria: Patient will be discharged from TR if patient refuses treatment 3 consecutive times without medical reason.  If treatment goals not met, if there is a change in medical status, if patient makes no progress towards goals or if patient is discharged from hospital.  The above assessment, treatment plan, treatment alternatives and goals were discussed and mutually agreed upon: by patient  Khyleigh Furney 11/04/2011, 9:26 AM

## 2011-11-04 NOTE — Progress Notes (Signed)
Occupational Therapy Weekly Progress Note  Patient Details  Name: Jeremy [name] MRN: [id number] Date of Birth: 1965/12/19  Today's Date: 11/04/2011 Time: 0930-1029 Time Calculation (min): 59 min  Patient has met 4 of 4 short term goals due to improved postural control, sustained attention, attention to left, and motor control during transitional movements.  Patient continues to demonstrate the following deficits: pain lower extremities, decreased attention to left,decreased visual coordination and  visual scanning, dense left hemiplegia, and morbid obesity and therefore will continue to benefit from skilled OT intervention to enhance overall performance with BADL.  Patient progressing toward long term goals..  Continue plan of care.  Have discussed with patient that it may be beneficial to consider further in-patient therapy in a skilled nursing facility, prior to discharge home, as he may need increased time for safe transition to home.    OT Short Term Goals Week 2:  OT Short Term Goal 1 (Week 2): Patient will transfer weight forward in sitting with mod assist in preparation for sit to stand OT Short Term Goal 1 - Progress (Week 2): Met OT Short Term Goal 2 (Week 2): Patient will utilize grab bar effectively to transition from sit to squat for LB bathing with max assist OT Short Term Goal 2 - Progress (Week 2): Met OT Short Term Goal 3 (Week 2): Patient will sustain attention to grooming or meal task for more than 5 min with min prompting OT Short Term Goal 3 - Progress (Week 2): Met OT Short Term Goal 4 (Week 2): Patient will effectively utilize right arm and leg to roll to left with min assist to aide with bathing  OT Short Term Goal 4 - Progress (Week 2): Met Week 3:  OT Short Term Goal 1 (Week 3): Patient will transition from sit  to stand with a grab bar and mod assist to aide with lower body dressing OT Short Term Goal 2 (Week 3): Patient will transfer to/ from  bedside commode  to  right with min assist, to left with max assist OT Short Term Goal 3 (Week 3): Patient will demonstrate selective attention during meaningful functional activity (meals, hygiene) for 10 min in minimally distracting env't.  OT Short Term Goal 5 (Week 3): Patient will roll to left with verbal cueing to aide with bathing, positioning, hygiene  Skilled Therapeutic Interventions/Progress Updates:    Patient seen for 1:1 OT therapy this am to address functional mobility, specifically, forward lean in sitting, lateral lean in sitting, and sit to stand transition with decreased reliance on pull from upper extremities.    Therapy Documentation Precautions:  Precautions Precautions: Fall Precaution Comments: left hemiparesis Restrictions Weight Bearing Restrictions: No   Pain: No report of pain this session    See FIM for current functional status  Therapy/Group: Individual Therapy  Collier Salina 11/04/2011, 2:08 PM

## 2011-11-04 NOTE — Progress Notes (Signed)
Subjective/Complaints: Comfortable this am. Hungry! A 12 point review of systems has been performed and if not noted above is otherwise negative.   Objective: Vital Signs: Blood pressure 139/93, pulse 87, temperature 97.9 F (36.6 C), temperature source Oral, resp. rate 18, weight 120.1 kg (264 lb 12.4 oz), SpO2 98.00%. No results found. No results found for this basename: WBC:2,HGB:2,HCT:2,PLT:2 in the last 72 hours No results found for this basename: NA:2,K:2,CL:2,CO2:2,GLUCOSE:2,BUN:2,CREATININE:2,CALCIUM:2 in the last 72 hours CBG (last 3)  No results found for this basename: GLUCAP:3 in the last 72 hours  Wt Readings from Last 3 Encounters:  11/03/11 120.1 kg (264 lb 12.4 oz)  10/18/11 121.1 kg (266 lb 15.6 oz)  10/18/11 121.1 kg (266 lb 15.6 oz)    Physical Exam:  Nursing note and vitals reviewed.  Constitutional: He is oriented to person, place, and time. He appears well-developed and well-nourished.  Morbidly obese male  .   HENT:  Head: Normocephalic and atraumatic.   Marland Kitchen Poor dentition. Missing multiple teeth. Vocal quality much improved. Eyes: Pupils are equal, round, and reactive to light.  Unable to close left eye. Dysconjugate gaze, exopthalmus. Cannot gaze upward Neck:  trach Wound with some debris.   Cardiovascular: Normal rate and regular rhythm.  Pulmonary/Chest: Effort normal.   No distress. No cough. clear Abdominal: Soft. Bowel sounds are normal. He exhibits no distension. There is no tenderness.  PEG site clean and dry.  Musculoskeletal: He exhibits tenderness (left knee with ROM.).  Neurological: He is alert and oriented to person, place, and time. He displays abnormal reflex.  Able to state DOB, current month but not the date. Polite and appropriate. Follows basic commands without difficulty.  . Left facial weakness. Speech quality and volume much better. Unable to look upward. Dense left hemiparesis with flexor tone at knee.   clonus left foot 1-2beats.  Senses pain in left leg and arm. LUE   trace. LLE 0-1 on MMT. Left arm and leg are easily moveable Skin: Skin is warm and dry. Trach site pink granulation tissue  Assessment/Plan: 1. 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 Physiatrist is providing close team supervision and 24 hour management of active medical problems listed below. Physiatrist and rehab team continue to assess barriers to discharge/monitor patient progress toward functional and medical goals. FIM: FIM - Bathing Bathing Steps Patient Completed: Chest;Left Arm;Abdomen;Front perineal area;Right upper leg;Left upper leg Bathing: 3: Mod-Patient completes 5-7 61f 10 parts or 50-74%  FIM - Upper Body Dressing/Undressing Upper body dressing/undressing steps patient completed: Thread/unthread right sleeve of pullover shirt/dresss Upper body dressing/undressing: 2: Max-Patient completed 25-49% of tasks FIM - Lower Body Dressing/Undressing Lower body dressing/undressing steps patient completed: Thread/unthread right pants leg Lower body dressing/undressing: 1: Two helpers  FIM - Toileting Toileting: 1: Two helpers  FIM - Diplomatic Services operational officer Devices: Psychiatrist Transfers: 3-To toilet/BSC: Mod A (lift or lower assist);1-Mechanical lift;1-Two helpers  FIM - Architectural technologist Transfer: 1: Two helpers;2: Chair or W/C > Bed: Max A (lift and lower assist);2: Bed > Chair or W/C: Max A (lift and lower assist);3: Supine > Sit: Mod A (lifting assist/Pt. 50-74%/lift 2 legs  FIM - Locomotion: Wheelchair Distance: 120 fet Locomotion: Wheelchair: 2: Travels 50 - 149 ft with moderate assistance (Pt: 50 - 74%) FIM - Locomotion: Ambulation Locomotion: Ambulation:  0: Activity  did not occur  Comprehension Comprehension Mode: Auditory Comprehension: 4-Understands basic 75 - 89% of the time/requires cueing 10 - 24% of the time  Expression Expression Mode: Verbal Expression Assistive Devices: 6-Talk trach valve Expression: 3-Expresses basic 50 - 74% of the time/requires cueing 25 - 50% of the time. Needs to repeat parts of sentences.  Social Interaction Social Interaction: 3-Interacts appropriately 50 - 74% of the time - May be physically or verbally inappropriate.  Problem Solving Problem Solving: 1-Solves basic less than 25% of the time - needs direction nearly all the time or does not effectively solve problems and may need a restraint for safety  Memory Memory: 2-Recognizes or recalls 25 - 49% of the time/requires cueing 51 - 75% of the time  Medical Problem List and Plan:  1. DVT Prophylaxis/Anticoagulation: Pharmaceutical: Lovenox  2. Pain Management:    baclofen for spasticity and voltaren gel for left knee pain.  3. Mood: No signs of distress noted.  4. Neuropsych: This patient is is not capable of making decisions on his/her own behalf.  5. HTN: Monitor with bid checks. continue metoprolol bid. Improvement overall 6. ABLA:  .   iron supplement if tolerating new diet without recurrent N/V  -labs better 7. Tone:  baclofen trial has helped.--increased to qid.  PRAFO for LLW 8. Pulmonary:doing well s/p self-decannulation  -needs to keep dressing on but frequently pulls at it 9. FEN: eating fairly well  -PEG needs to stay in 4-6 weeks total LOS (Days) 13 A FACE TO FACE EVALUATION WAS PERFORMED  Jeremy [name] T 11/04/2011, 7:36 AM

## 2011-11-04 NOTE — Progress Notes (Signed)
Speech Language Pathology Daily Session Note  Patient Details  Name: Jeremy [name] MRN: [id number] Date of Birth: 09-Jan-1966  Today's Date: 11/04/2011 Time: 4098-1191 Time Calculation (min): 45 min  Short Term Goals: Week 2: SLP Short Term Goal 1 (Week 2): Pt will demonstrate functional problem solving with basic and familiar tasks with min A verbal cues.  SLP Short Term Goal 2 (Week 2): Pt will initiate functional tasks with supervision verbal and tactile cues.  SLP Short Term Goal 3 (Week 2): Pt will attend to left enviornment and body with Mod A verbal and question cues.  SLP Short Term Goal 4 (Week 2): pt will utilize increased vocal intensity with  Min A verbal cues at the phrase level SLP Short Term Goal 5 (Week 2): Pt will utilize swallowing compensatory strategies with supervision verbal cues.  SLP Short Term Goal 5 - Progress (Week 2): Progressing toward goal SLP Short Term Goal 6 (Week 2): Pt will sustain attention to functional task for 5 minutes with Max A verbal cues for redirection   Skilled Therapeutic Interventions: Treatment focus on mildly complex problem solving, left attention and working memory. Pt participated in new learning task with a mildly complex card game and completed task with Mod verbal and question cues for problem solving. Pt also required Mod verbal cues to utilize visual cues to recall directions of task. Pt required Mod questioning cues to attend to LUE during task.    FIM:  Comprehension Comprehension: 4-Understands basic 75 - 89% of the time/requires cueing 10 - 24% of the time Expression Expression Mode: Verbal Expression: 3-Expresses basic 50 - 74% of the time/requires cueing 25 - 50% of the time. Needs to repeat parts of sentences. Social Interaction Social Interaction: 4-Interacts appropriately 75 - 89% of the time - Needs redirection for appropriate language or to initiate interaction. Problem Solving Problem Solving: 2-Solves basic 25 - 49%  of the time - needs direction more than half the time to initiate, plan or complete simple activities Memory Memory: 3-Recognizes or recalls 50 - 74% of the time/requires cueing 25 - 49% of the time  Pain Pain Assessment Pain Assessment: No/denies pain   Therapy/Group: Individual Therapy  Monifah Freehling 11/04/2011, 6:05 PM

## 2011-11-04 NOTE — Progress Notes (Signed)
Occupational Therapy Session Note  Patient Details  Name: Jeremy [name] MRN: [id number] Date of Birth: [birth year]  Today's Date: 11/04/2011 Time: 0930-1029 Time Calculation (min): 59 min  Short Term Goals: Week 2:  OT Short Term Goal 1 (Week 2): Patient will transfer weight forward in sitting with mod assist in preparation for sit to stand OT Short Term Goal 2 (Week 2): Patient will utilize grab bar effectively to transition from sit to squat for LB bathing with max assist OT Short Term Goal 3 (Week 2): Patient will sustain attention to grooming or meal task for more than 5 min with min prompting OT Short Term Goal 4 (Week 2): Patient will effectively utilize right arm and leg to roll to left with min assist to aide with bathing   Skilled Therapeutic Interventions/Progress Updates:    Patient seen in 1:1 OT today to address functional mobility and sustained attention during meaningful self care tasks.  Patient assisted to bedside commode via scoot pivot transfer and mod assist.  Second staff member present.  Patient able to have large formed continent BM on commode, then transfer via sliding board (to right) with increased time, max verbal cueing and physical assistance. Patient able to clean himself after BM, although needed assistance for thoroughness.  Utilized grab bar in bathroom, to transition to a semi standing position, to pull up brief.  Patient able to sustain attention x while shaving, combing hair, and cleaning teeth.  Therapy Documentation Precautions:  Precautions Precautions: Fall Precaution Comments: left hemiparesis Restrictions Weight Bearing Restrictions: No   Pain: Right ankle pain, RN applied Voltarin gel See FIM for current functional status  Therapy/Group: Individual Therapy  Collier Salina 11/04/2011, 1:57 PM

## 2011-11-05 ENCOUNTER — Inpatient Hospital Stay (HOSPITAL_COMMUNITY): Payer: BC Managed Care – PPO | Admitting: Speech Pathology

## 2011-11-05 ENCOUNTER — Inpatient Hospital Stay (HOSPITAL_COMMUNITY): Payer: BC Managed Care – PPO | Admitting: *Deleted

## 2011-11-05 ENCOUNTER — Encounter (HOSPITAL_COMMUNITY): Payer: BC Managed Care – PPO | Admitting: Occupational Therapy

## 2011-11-05 NOTE — Progress Notes (Addendum)
Physical Therapy Session Note  Patient Details  Name: Jeremy [name] MRN: [id number] Date of Birth: Jul 11, 1965  Today's Date: 11/05/2011 1:2 Time: 1130-1200 Time Calculation (min): 30 min  2:2 Time: 13:00-13:45 ( )   Short Term Goals: Week 2:  PT Short Term Goal 1 (Week 2): Patient will perform bed mobility with max A and max verbal cues for initiation and sequencing  PT Short Term Goal 2 (Week 2): Patient will transfer wheelchair to bed and mat with sliding board and max assistance of 1.  PT Short Term Goal 3 (Week 2): Patient will propell wheelchair 100 feet on level tile surfaces in controlled environment using right extremities with supervision.  PT Short Term Goal 4 (Week 2): Patient will stand with UE support and max assist for 2 minutes to assist with caregiver.  Skilled Therapeutic Interventions/Progress Updates:  1:1: Tx focused on bed mobility, transfer training, WC mobility, and standing tolerance for LE strengthening.  Rolling L in bed with Min A, Sidelying>sit with Max A for LE management and trunk elevation with elevated HOB. Frequent cues for attention to task.   Sliding board transfer with Mod A only with assist placing board properly and 2nd person for safety/stabilize WC prn. Pt able to assist with R transfer using RUE and LE, downhill.   Pt propelled WC x120' with RUE/LE and Mod A for steering and cues for technique.   Pt stood in standing frame x80min with attempts at reaching tasks, but pt unable to lift chest off table effectively.  Sit<>stand at side of // bars x1 with +2 assist x15 sec, but pt unable to fully extend LLE and c/o pain.   2:2 Tx focused on transfer training, sit<>stand transfers, and standing tolerance.  Pt propelled WC same as AM tx.   Sit<>stand in // bars x5, rather than standing frame as pt unable to achieve upright posture in frame. +2 assist needed for sit<>stand and pt able to stand 2x30 sec, 1x45sec, and 2x60 sec with manual assist for L  knee and bil hip ext. Pt still unable to maintain upright trunk, but able to momentarily achieve with visual cues. Rest breaks needed for recovery. Encouraged pt for independence with transfers as he reports preferring lift due to comfort rather   Pt requesting to use lift to get to bed and use bedpan, but encouraged pt to continue progressing independence by sliding board>BSC. Sliding board transfers WC>BSC>bed towards R with +2 assist (pt 60%) and a third for safety as pt very apprehensive about using BSC rather than bed pan at this time. Pt continent of bladder, but unable to have bowel movement.  Attempted sit>stands from [facility] pulling on bed rail with RUE to adjust clothing x4, but pt unable to assist enough to clear hips. Sit>supine with Max A, rolling R/L with Mod A for adjusting pants.   Cued pt to scoot up in bed, and encouraged pt to perform with bed flat when requesting tilt, which he was able to do with RUE and LE.       Therapy Documentation Precautions:  Precautions Precautions: Fall Precaution Comments: left hemiparesis Restrictions Weight Bearing Restrictions: No Pain: Pt c/o L hip, L shoulder, and R foot/ankle intermittently throughout tx. Adjusted positioning and activity prn.    Locomotion : Wheelchair Mobility Distance: 120   See FIM for current functional status  Therapy/Group: Individual Therapy  Virl Cagey, PT 11/05/2011, 12:22 PM

## 2011-11-05 NOTE — Progress Notes (Signed)
Occupational Therapy Session Note  Patient Details  Name: Jeremy [name] MRN: [id number] Date of Birth: [birth year]  Today's Date: 11/05/2011 Time: 0830-0930 Time Calculation (min): 60 min  Short Term Goals: Week 3:  OT Short Term Goal 1 (Week 3): Patient will transition from sit  to stand with a grab bar and mod assist to aide with lower body dressing OT Short Term Goal 2 (Week 3): Patient will transfer to/ from  bedside commode  to right with min assist, to left with max assist OT Short Term Goal 3 (Week 3): Patient will demonstrate selective attention during meaningful functional activity (meals, hygiene) for 10 min in minimally distracting env't.  OT Short Term Goal 5 (Week 3): Patient will roll to left with verbal cueing to aide with bathing, positioning, hygiene  Skilled Therapeutic Interventions/Progress Updates:    Patient participated in 1:1 OT this am to address bed mobility dynamic sitting balance, transitional movements while bathing and dressing self from a seated position.  Patient unable to lift off surface to begin to stand from elevated surface.  Patient fearful, morbidly obese, and has painful LE's.  Patient returned to supine position and rolled to pull pants up over hips.  Patient with report of left hip pain with handling left leg, with hip flexion and internal rotation.  Repositioned patient in semi-sidelying positon as he was reporting his buttocks was sore.    Therapy Documentation Precautions:  Precautions Precautions: Fall Precaution Comments: left hemiparesis Restrictions Weight Bearing Restrictions: No  Pain: Left hip with all passive movement.  RN made aware  See FIM for current functional status  Therapy/Group: Individual Therapy  Collier Salina 11/05/2011, 11:17 AM

## 2011-11-05 NOTE — Progress Notes (Signed)
Subjective/Complaints: No complaints. Ate all of his breakfast A 12 point review of systems has been performed and if not noted above is otherwise negative.   Objective: Vital Signs: Blood pressure 140/98, pulse 83, temperature 97.3 F (36.3 C), temperature source Axillary, resp. rate 19, weight 120.1 kg (264 lb 12.4 oz), SpO2 96.00%. No results found. No results found for this basename: WBC:2,HGB:2,HCT:2,PLT:2 in the last 72 hours No results found for this basename: NA:2,K:2,CL:2,CO2:2,GLUCOSE:2,BUN:2,CREATININE:2,CALCIUM:2 in the last 72 hours CBG (last 3)  No results found for this basename: GLUCAP:3 in the last 72 hours  Wt Readings from Last 3 Encounters:  11/03/11 120.1 kg (264 lb 12.4 oz)  10/18/11 121.1 kg (266 lb 15.6 oz)  10/18/11 121.1 kg (266 lb 15.6 oz)    Physical Exam:  Nursing note and vitals reviewed.  Constitutional: He is oriented to person, place, and time. He appears well-developed and well-nourished.  Morbidly obese male  .   HENT:  Head: Normocephalic and atraumatic.   Marland Kitchen Poor dentition. Missing multiple teeth. Vocal quality much improved. Eyes: Pupils are equal, round, and reactive to light.  Unable to close left eye. Dysconjugate gaze, exopthalmus. Cannot gaze upward Neck:  trach Wound with some debris.   Cardiovascular: Normal rate and regular rhythm.  Pulmonary/Chest: Effort normal.   No distress. No cough. clear Abdominal: Soft. Bowel sounds are normal. He exhibits no distension. There is no tenderness.  PEG site clean and dry.  Musculoskeletal: He exhibits tenderness (left knee with ROM.).  Neurological: He is alert and oriented to person, place, and time. He displays abnormal reflex.   e. Polite and appropriate. Follows basic commands without difficulty.  . Left facial weakness. Speech quality and volume much better. Unable to look upward. Dense left hemiparesis with flexor tone at knee.   clonus left foot 1-2beats. Senses pain in left leg and arm.  LUE   trace. LLE 0-1 on MMT. Left arm and leg are easily moveable Skin: Skin is warm and dry. Trach site pink granulation tissue  Assessment/Plan: 1. 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 Physiatrist is providing close team supervision and 24 hour management of active medical problems listed below. Physiatrist and rehab team continue to assess barriers to discharge/monitor patient progress toward functional and medical goals. FIM: FIM - Bathing Bathing Steps Patient Completed: Chest;Left Arm;Abdomen;Front perineal area;Right upper leg;Left upper leg Bathing: 3: Mod-Patient completes 5-7 69f 10 parts or 50-74%  FIM - Upper Body Dressing/Undressing Upper body dressing/undressing steps patient completed: Thread/unthread right sleeve of pullover shirt/dresss;Put head through opening of pull over shirt/dress Upper body dressing/undressing: 3: Mod-Patient completed 50-74% of tasks FIM - Lower Body Dressing/Undressing Lower body dressing/undressing steps patient completed: Thread/unthread right pants leg Lower body dressing/undressing: 0: Wears gown/pajamas-no public clothing  FIM - Toileting Toileting: 1: Two helpers  FIM - Diplomatic Services operational officer Devices: Human resources officer Transfers: 3-To toilet/BSC: Mod A (lift or lower assist);2-From toilet/BSC: Max A (lift and lower assist) (to right each time)  FIM - Bed/Chair Transfer Bed/Chair Transfer Assistive Devices: Arm rests;Bed rails Bed/Chair Transfer: 1: Supine > Sit: Total A (helper does all/Pt. < 25%);3: Bed > Chair or W/C: Mod A (lift or lower assist)  FIM - Locomotion: Wheelchair Distance: 120 fet Locomotion: Wheelchair: 1: Total Assistance/staff pushes wheelchair (Pt<25%) FIM - Locomotion: Ambulation Locomotion: Ambulation:  0: Activity did not  occur  Comprehension Comprehension Mode: Auditory Comprehension: 4-Understands basic 75 - 89% of the time/requires cueing 10 - 24% of the time  Expression Expression Mode: Verbal Expression Assistive Devices: 6-Talk trach valve Expression: 3-Expresses basic 50 - 74% of the time/requires cueing 25 - 50% of the time. Needs to repeat parts of sentences.  Social Interaction Social Interaction: 4-Interacts appropriately 75 - 89% of the time - Needs redirection for appropriate language or to initiate interaction.  Problem Solving Problem Solving: 2-Solves basic 25 - 49% of the time - needs direction more than half the time to initiate, plan or complete simple activities  Memory Memory: 3-Recognizes or recalls 50 - 74% of the time/requires cueing 25 - 49% of the time  Medical Problem List and Plan:  1. DVT Prophylaxis/Anticoagulation: Pharmaceutical: Lovenox  2. Pain Management:    baclofen for spasticity and voltaren gel for left knee pain.  3. Mood: No signs of distress noted.  4. Neuropsych: This patient is is not capable of making decisions on his/her own behalf.  5. HTN: Monitor with bid checks. continue metoprolol bid. Trending down. No further changes yet 6. ABLA:  .   iron supplement if tolerating new diet without recurrent N/V  -labs better 7. Tone:  baclofen trial has helped.--increased to qid.  PRAFO for LLW 8. Pulmonary:doing well s/p self-decannulation  -needs to keep dressing on but frequently pulls at it 9. FEN: eating fairly well  -PEG needs to stay in 4-6 weeks total LOS (Days) 14 A FACE TO FACE EVALUATION WAS PERFORMED  SWARTZ,ZACHARY T 11/05/2011, 7:57 AM

## 2011-11-05 NOTE — Progress Notes (Signed)
Speech Language Pathology Session & Weekly Progress Note  Patient Details  Name: Doran Nestle MRN: [id number] Date of Birth: 11-Feb-1966  Today's Date: 11/05/2011 Time: 1000-1045 Time Calculation (min): 45 min  Skilled Therapeutic Intervention: Treatment focus on working memory and mildly complex problem solving. Pt recalled rules from card game from yesterday's session with supervision question cues and demonstrated problem solving with Min question cues. Pt demonstrated initiation and selective attention with task for 30 minutes with supervision verbal cues for redirection.   Short Term Goals: Week 2: SLP Short Term Goal 1 (Week 2): Pt will demonstrate functional problem solving with basic and familiar tasks with min A verbal cues.  SLP Short Term Goal 1 - Progress (Week 2): Not met SLP Short Term Goal 2 (Week 2): Pt will initiate functional tasks with supervision verbal and tactile cues.  SLP Short Term Goal 2 - Progress (Week 2): Met SLP Short Term Goal 3 (Week 2): Pt will attend to left enviornment and body with Mod A verbal and question cues.  SLP Short Term Goal 3 - Progress (Week 2): Met SLP Short Term Goal 4 (Week 2): pt will utilize increased vocal intensity with  Min A verbal cues at the phrase level SLP Short Term Goal 4 - Progress (Week 2): Met SLP Short Term Goal 5 (Week 2): Pt will utilize swallowing compensatory strategies with supervision verbal cues.  SLP Short Term Goal 5 - Progress (Week 2): Met SLP Short Term Goal 6 (Week 2): Pt will sustain attention to functional task for 5 minutes with Max A verbal cues for redirection  SLP Short Term Goal 6 - Progress (Week 2): Met  New Short Term Goals: Week 3: SLP Short Term Goal 1 (Week 3): Pt will demonstrate functional problem solving for functional and familiar tasks with Min A verbal and question cues.  SLP Short Term Goal 2 (Week 3): Pt will demonstrate selective attention in a mildly distracting enviornment for 10 minutes  with Mod A verbal cues for redirection.  SLP Short Term Goal 3 (Week 3): Pt will attend to left enviornment/body with Min A verbal and question cues.  SLP Short Term Goal 4 (Week 3): Pt will initiate functional tasks with Mod I.   Weekly Progress Updates: Pt has met 5 out of 6 STG's this reporting period. Currently, pt is overall Mod A for functional problem solving and working memory and attention and supervision for initiation and utilization of swallowing compensatory strategies with regular textures and thin liquids. Pt/family education ongoing. Pt would benefit from continued skilled SLP intervention to maximize cognitive function and overall independence.    SLP Frequency: 1-2 X/day, 30-60 minutes Estimated Length of Stay: 2 weeks SLP Treatment/Interventions: Cognitive remediation/compensation;Cueing hierarchy;Functional tasks;Internal/external aids;Environmental controls;Therapeutic Activities;Speech/Language facilitation;Patient/family education  Daily Session FIM:  Comprehension Comprehension Mode: Auditory Comprehension: 4-Understands basic 75 - 89% of the time/requires cueing 10 - 24% of the time Expression Expression Mode: Verbal Expression: 4-Expresses basic 75 - 89% of the time/requires cueing 10 - 24% of the time. Needs helper to occlude trach/needs to repeat words. Social Interaction Social Interaction: 4-Interacts appropriately 75 - 89% of the time - Needs redirection for appropriate language or to initiate interaction. Problem Solving Problem Solving: 3-Solves basic 50 - 74% of the time/requires cueing 25 - 49% of the time Memory Memory: 3-Recognizes or recalls 50 - 74% of the time/requires cueing 25 - 49% of the time General    Pain Pain Assessment Pain Assessment: No/denies pain  Therapy/Group: Individual Therapy  Antigone Crowell 11/05/2011, 3:10 PM

## 2011-11-06 ENCOUNTER — Inpatient Hospital Stay (HOSPITAL_COMMUNITY): Payer: BC Managed Care – PPO

## 2011-11-06 DIAGNOSIS — I10 Essential (primary) hypertension: Secondary | ICD-10-CM

## 2011-11-06 DIAGNOSIS — Z5189 Encounter for other specified aftercare: Secondary | ICD-10-CM

## 2011-11-06 DIAGNOSIS — J9600 Acute respiratory failure, unspecified whether with hypoxia or hypercapnia: Secondary | ICD-10-CM

## 2011-11-06 DIAGNOSIS — I619 Nontraumatic intracerebral hemorrhage, unspecified: Secondary | ICD-10-CM

## 2011-11-06 NOTE — Progress Notes (Signed)
Patient ID: Jeremy [name], male   DOB: September 28, 1965, 46 y.o.   MRN: [id number] Subjective/Complaints:L Hip pain with movement A 12 point review of systems has been performed and if not noted above is otherwise negative.   Objective: Vital Signs: Blood pressure 150/105, pulse 80, temperature 98.4 F (36.9 C), temperature source Oral, resp. rate 18, weight 122.4 kg (269 lb 13.5 oz), SpO2 95.00%. No results found. No results found for this basename: WBC:2,HGB:2,HCT:2,PLT:2 in the last 72 hours No results found for this basename: NA:2,K:2,CL:2,CO2:2,GLUCOSE:2,BUN:2,CREATININE:2,CALCIUM:2 in the last 72 hours CBG (last 3)  No results found for this basename: GLUCAP:3 in the last 72 hours  Wt Readings from Last 3 Encounters:  11/06/11 122.4 kg (269 lb 13.5 oz)  10/18/11 121.1 kg (266 lb 15.6 oz)  10/18/11 121.1 kg (266 lb 15.6 oz)    Physical Exam:  Nursing note and vitals reviewed.  Constitutional: He is oriented to person, place, and time. He appears well-developed and well-nourished.  Morbidly obese male  .   HENT:  Head: Normocephalic and atraumatic.   Marland Kitchen Poor dentition. Missing multiple teeth. Vocal quality much improved. Eyes: Pupils are equal, round, and reactive to light.  Unable to close left eye. Dysconjugate gaze, exopthalmus. Cannot gaze upward Neck:  trach Wound with some debris.   Cardiovascular: Normal rate and regular rhythm.  Pulmonary/Chest: Effort normal.   No distress. No cough. clear Abdominal: Soft. Bowel sounds are normal. He exhibits no distension. There is no tenderness.  PEG site clean and dry.  Musculoskeletal: He exhibits tenderness (left knee with ROM.).  Neurological: He is alert and oriented to person, place, and time. He displays abnormal reflex.   e. Polite and appropriate. Follows basic commands without difficulty.  . Left facial weakness. Speech quality and volume much better. Unable to look upward. Dense left hemiparesis with flexor tone at knee.    clonus left foot 1-2beats. Senses pain in left leg and arm. LUE   trace. LLE 0-1 on MMT. Left arm and leg are easily moveable Skin: Skin is warm and dry. Trach site pink granulation tissue L Hip mild paion with ROM Assessment/Plan: 1. 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 Physiatrist is providing close team supervision and 24 hour management of active medical problems listed below. Physiatrist and rehab team continue to assess barriers to discharge/monitor patient progress toward functional and medical goals. FIM: FIM - Bathing Bathing Steps Patient Completed: Chest;Abdomen;Right upper leg;Left upper leg Bathing: 2: Max-Patient completes 3-4 80f 10 parts or 25-49%  FIM - Upper Body Dressing/Undressing Upper body dressing/undressing steps patient completed: Thread/unthread right sleeve of pullover shirt/dresss;Put head through opening of pull over shirt/dress Upper body dressing/undressing: 3: Mod-Patient completed 50-74% of tasks FIM - Lower Body Dressing/Undressing Lower body dressing/undressing steps patient completed: Thread/unthread right pants leg Lower body dressing/undressing: 1: Total-Patient completed less than 25% of tasks  FIM - Toileting Toileting steps completed by patient: Performs perineal hygiene Toileting: 2: Max-Patient completed 1 of 3 steps  FIM - Diplomatic Services operational officer Devices: Human resources officer Transfers: 2-To toilet/BSC: Max A (lift and lower assist);2-From toilet/BSC: Max A (lift and lower assist) (To pt's R)  FIM - Banker Devices: Sliding board;Arm rests Bed/Chair Transfer: 2: Sit > Supine: Max A (lifting assist/Pt. 25-49%);1: Two helpers  FIM - Locomotion: Wheelchair Distance: 120 Locomotion: Wheelchair: 2:  Travels 50 - 149 ft with moderate assistance (Pt: 50 - 74%) FIM - Locomotion: Ambulation Locomotion: Ambulation: 0: Activity did not occur  Comprehension Comprehension Mode: Auditory Comprehension: 4-Understands basic 75 - 89% of the time/requires cueing 10 - 24% of the time  Expression Expression Mode: Verbal Expression Assistive Devices: 6-Talk trach valve Expression: 3-Expresses basic 50 - 74% of the time/requires cueing 25 - 50% of the time. Needs to repeat parts of sentences.  Social Interaction Social Interaction: 4-Interacts appropriately 75 - 89% of the time - Needs redirection for appropriate language or to initiate interaction.  Problem Solving Problem Solving: 3-Solves basic 50 - 74% of the time/requires cueing 25 - 49% of the time  Memory Memory: 3-Recognizes or recalls 50 - 74% of the time/requires cueing 25 - 49% of the time  Medical Problem List and Plan:  1. DVT Prophylaxis/Anticoagulation: Pharmaceutical: Lovenox  2. Pain Management:    baclofen for spasticity and voltaren gel for left knee pain. , Hip pain check xray 3. Mood: No signs of distress noted.  4. Neuropsych: This patient is is not capable of making decisions on his/her own behalf.  5. HTN: Monitor with bid checks. continue metoprolol bid. Trending down. No further changes yet 6. ABLA:  .   iron supplement if tolerating new diet without recurrent N/V  -labs better 7. Tone:  baclofen trial has helped.--increased to qid.  PRAFO for LLW 8. Pulmonary:doing well s/p self-decannulation  -needs to keep dressing on but frequently pulls at it 9. FEN: eating fairly well  -PEG needs to stay in 4-6 weeks total LOS (Days) 15 A FACE TO FACE EVALUATION WAS PERFORMED  Samyak Sackmann E 11/06/2011, 6:37 AM

## 2011-11-07 ENCOUNTER — Inpatient Hospital Stay (HOSPITAL_COMMUNITY): Payer: BC Managed Care – PPO | Admitting: Physical Therapy

## 2011-11-07 NOTE — Progress Notes (Signed)
Patient ID: Jeremy [name], male   DOB: 12-31-1965, 46 y.o.   MRN: [id number] Subjective/Complaints:L Hip pain with movement A 12 point review of systems has been performed and if not noted above is otherwise negative.   Objective: Vital Signs: Blood pressure 148/100, pulse 74, temperature 99.1 F (37.3 C), temperature source Oral, resp. rate 18, weight 122.4 kg (269 lb 13.5 oz), SpO2 95.00%. Dg Hip Complete Left  11/06/2011  *RADIOLOGY REPORT*  Clinical Data: Fall  LEFT HIP - COMPLETE 2+ VIEW  Comparison: None.  Findings: Four views of the left hip submitted.  No acute fracture or subluxation.  Mild spurring bilateral greater femoral trochanter.  IMPRESSION: No left hip acute fracture or subluxation.   Original Report Authenticated By: Natasha Mead, M.D.    No results found for this basename: WBC:2,HGB:2,HCT:2,PLT:2 in the last 72 hours No results found for this basename: NA:2,K:2,CL:2,CO2:2,GLUCOSE:2,BUN:2,CREATININE:2,CALCIUM:2 in the last 72 hours CBG (last 3)  No results found for this basename: GLUCAP:3 in the last 72 hours  Wt Readings from Last 3 Encounters:  11/06/11 122.4 kg (269 lb 13.5 oz)  10/18/11 121.1 kg (266 lb 15.6 oz)  10/18/11 121.1 kg (266 lb 15.6 oz)    Physical Exam:  Nursing note and vitals reviewed.  Constitutional: He is oriented to person, place, and time. He appears well-developed and well-nourished.  Morbidly obese male  .   HENT:  Head: Normocephalic and atraumatic.   Marland Kitchen Poor dentition. Missing multiple teeth. Vocal quality much improved. Eyes: Pupils are equal, round, and reactive to light.  Unable to close left eye. Dysconjugate gaze, exopthalmus. Cannot gaze upward Neck:  trach Wound with some debris.   Cardiovascular: Normal rate and regular rhythm.  Pulmonary/Chest: Effort normal.   No distress. No cough. clear Abdominal: Soft. Bowel sounds are normal. He exhibits no distension. There is no tenderness.  PEG site clean and dry.  Musculoskeletal: He  exhibits tenderness (left knee with ROM.).  Neurological: He is alert and oriented to person, place, and time. He displays abnormal reflex.   e. Polite and appropriate. Follows basic commands without difficulty.  . Left facial weakness. Speech quality and volume much better. Unable to look upward. Dense left hemiparesis with flexor tone at knee.   clonus left foot 1-2beats. Senses pain in left leg and arm. LUE   trace. LLE 0-1 on MMT. Left arm and leg are easily moveable Skin: Skin is warm and dry. Trach site pink granulation tissue L Hip mild paion with ROM Assessment/Plan: 1. 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 Physiatrist is providing close team supervision and 24 hour management of active medical problems listed below. Physiatrist and rehab team continue to assess barriers to discharge/monitor patient progress toward functional and medical goals. FIM: FIM - Bathing Bathing Steps Patient Completed: Chest Bathing: 2: Max-Patient completes 3-4 16f 10 parts or 25-49%  FIM - Upper Body Dressing/Undressing Upper body dressing/undressing steps patient completed: Thread/unthread right sleeve of pullover shirt/dresss;Put head through opening of pull over shirt/dress Upper body dressing/undressing: 3: Mod-Patient completed 50-74% of tasks FIM - Lower Body Dressing/Undressing Lower body dressing/undressing steps patient completed: Thread/unthread right pants leg Lower body dressing/undressing: 1: Total-Patient completed less than 25% of tasks  FIM - Toileting Toileting steps completed by patient: Performs perineal hygiene Toileting: 2: Max-Patient completed 1 of 3 steps  FIM - Diplomatic Services operational officer  Devices: Bedside commode;Sliding board Toilet Transfers: 2-To toilet/BSC: Max A (lift and lower  assist);2-From toilet/BSC: Max A (lift and lower assist) (To pt's R)  FIM - Banker Devices: Sliding board;Arm rests Bed/Chair Transfer: 1: Mechanical lift  FIM - Locomotion: Wheelchair Distance: 120 Locomotion: Wheelchair: 2: Travels 50 - 149 ft with moderate assistance (Pt: 50 - 74%) FIM - Locomotion: Ambulation Locomotion: Ambulation: 0: Activity did not occur  Comprehension Comprehension Mode: Auditory Comprehension: 4-Understands basic 75 - 89% of the time/requires cueing 10 - 24% of the time  Expression Expression Mode: Verbal Expression Assistive Devices: 6-Talk trach valve Expression: 3-Expresses basic 50 - 74% of the time/requires cueing 25 - 50% of the time. Needs to repeat parts of sentences.  Social Interaction Social Interaction: 4-Interacts appropriately 75 - 89% of the time - Needs redirection for appropriate language or to initiate interaction.  Problem Solving Problem Solving: 3-Solves basic 50 - 74% of the time/requires cueing 25 - 49% of the time  Memory Memory: 3-Recognizes or recalls 50 - 74% of the time/requires cueing 25 - 49% of the time  Medical Problem List and Plan:  1. DVT Prophylaxis/Anticoagulation: Pharmaceutical: Lovenox  2. Pain Management:    baclofen for spasticity and voltaren gel for left knee pain. , Hip pain  Xray- 3. Mood: No signs of distress noted.  4. Neuropsych: This patient is is not capable of making decisions on his/her own behalf.  5. HTN: Monitor with bid checks. continue metoprolol bid. Trending down. No further changes yet 6. ABLA:  .   iron supplement if tolerating new diet without recurrent N/V  -labs better 7. Tone:  baclofen trial has helped.--increased to qid.  PRAFO for LLW 8. Pulmonary:doing well s/p self-decannulation  -needs to keep dressing on but frequently pulls at it 9. FEN: eating fairly well  -PEG needs to stay in 4-6 weeks total LOS (Days) 16 A FACE TO FACE EVALUATION  WAS PERFORMED  Gera Inboden E 11/07/2011, 6:50 AM

## 2011-11-07 NOTE — Progress Notes (Signed)
Physical Therapy Note  Patient Details  Name: Jeremy [name] MRN: [id number] Date of Birth: 09/11/1965 Today's Date: 11/07/2011  1330-1355 (25 minutes) individual Pain: pt reports unrated pain LT hip/premedicated Focus of treatment: transfer training/ neuro re-ed LT LE Treatment: recliner to bed transfer using sliding board setup + total assist + 2 ( pt 20%). Pt attempts to assist requiring mod verbal and tactile cues for forward trunk lean prior to transfer and to prevent scooting forward on board; sidelying gravity eliminated Lt hip flexion ( no active movement initiated).   Tiaria Biby,JIM 11/07/2011, 8:08 AM

## 2011-11-08 ENCOUNTER — Inpatient Hospital Stay (HOSPITAL_COMMUNITY): Payer: BC Managed Care – PPO | Admitting: Physical Therapy

## 2011-11-08 ENCOUNTER — Inpatient Hospital Stay (HOSPITAL_COMMUNITY): Payer: BC Managed Care – PPO | Admitting: Speech Pathology

## 2011-11-08 ENCOUNTER — Inpatient Hospital Stay (HOSPITAL_COMMUNITY): Payer: BC Managed Care – PPO

## 2011-11-08 ENCOUNTER — Encounter (HOSPITAL_COMMUNITY): Payer: BC Managed Care – PPO | Admitting: Occupational Therapy

## 2011-11-08 DIAGNOSIS — I10 Essential (primary) hypertension: Secondary | ICD-10-CM

## 2011-11-08 DIAGNOSIS — J9600 Acute respiratory failure, unspecified whether with hypoxia or hypercapnia: Secondary | ICD-10-CM

## 2011-11-08 DIAGNOSIS — Z5189 Encounter for other specified aftercare: Secondary | ICD-10-CM

## 2011-11-08 DIAGNOSIS — I619 Nontraumatic intracerebral hemorrhage, unspecified: Secondary | ICD-10-CM

## 2011-11-08 MED ORDER — LISINOPRIL 5 MG PO TABS
5.0000 mg | ORAL_TABLET | Freq: Two times a day (BID) | ORAL | Status: DC
  Administered 2011-11-08 – 2011-11-14 (×13): 5 mg via ORAL
  Filled 2011-11-08 (×16): qty 1

## 2011-11-08 MED ORDER — GABAPENTIN 100 MG PO CAPS
100.0000 mg | ORAL_CAPSULE | Freq: Three times a day (TID) | ORAL | Status: DC
  Administered 2011-11-08 – 2011-11-14 (×21): 100 mg via ORAL
  Filled 2011-11-08 (×27): qty 1

## 2011-11-08 NOTE — Progress Notes (Signed)
Patient in room up in chair at approximately 1150 and noted projectile vomiting large amount of undigested eggs. Patient assisted back in bed and refused meds and requested to rest .   Patient able to eat late lunch without vomiting or nausea . Continue with plan of care .                                Jeremy [name]

## 2011-11-08 NOTE — Progress Notes (Signed)
Physical Therapy Session Note  Patient Details  Name: Jeremy [name] MRN: [id number] Date of Birth: 1965/02/28  Today's Date: 11/08/2011 Time: 1030-1125 Time Calculation (min): 55 min  Short Term Goals: Week 2:  PT Short Term Goal 1 (Week 2): Patient will perform bed mobility with max A and max verbal cues for initiation and sequencing  PT Short Term Goal 2 (Week 2): Patient will transfer wheelchair to bed and mat with sliding board and max assistance of 1.  PT Short Term Goal 3 (Week 2): Patient will propell wheelchair 100 feet on level tile surfaces in controlled environment using right extremities with supervision.  PT Short Term Goal 4 (Week 2): Patient will stand with UE support and max assist for 2 minutes to assist with caregiver. Week 3:     Skilled Therapeutic Interventions/Progress Updates:  Patient max assist for left side lying to sit edge of bed. Patient transferred to right side with sliding board and max assist of 1. Patient stood in standing frame working on trunk and hip extension and weight bearing through lower extremities. Patient played tic tac toe and connect four as distraction and to get taller in stance. Patient stood for 9.5 minutes and 6 minutes. Patient propelled wheelchair 100 feet on level tile using right extremities with mod assist to help steer.   Therapy Documentation Precautions:  Precautions Precautions: Fall Precaution Comments: left hemiparesis Restrictions Weight Bearing Restrictions: No  Pain: Pain Assessment Pain Assessment: 0-10 Pain Score:   8 Pain Location: Hip Pain Orientation: Left Patient's RN notified and medications given. Even though patient rated pain very high, he was able to tolerate standing without complaints.  Locomotion : Wheelchair Mobility Distance: 100 feet   See FIM for current functional status  Therapy/Group: Individual Therapy  Arelia Longest M 11/08/2011, 11:58 AM

## 2011-11-08 NOTE — Progress Notes (Signed)
Occupational Therapy Session Note  Patient Details  Name: Jeremy [name] MRN: [id number] Date of Birth: [birth year]  Today's Date: 11/08/2011 Time: 0800-0858 Time Calculation (min): 58 min  Short Term Goals: Week 2:  OT Short Term Goal 1 (Week 2): Patient will transfer weight forward in sitting with mod assist in preparation for sit to stand OT Short Term Goal 1 - Progress (Week 2): Met OT Short Term Goal 2 (Week 2): Patient will utilize grab bar effectively to transition from sit to squat for LB bathing with max assist OT Short Term Goal 2 - Progress (Week 2): Met OT Short Term Goal 3 (Week 2): Patient will sustain attention to grooming or meal task for more than 5 min with min prompting OT Short Term Goal 3 - Progress (Week 2): Met OT Short Term Goal 4 (Week 2): Patient will effectively utilize right arm and leg to roll to left with min assist to aide with bathing  OT Short Term Goal 4 - Progress (Week 2): Met  Skilled Therapeutic Interventions/Progress Updates:    Worked on bathing and dressing at EOB to supine during the session.  Pt required total assist to transition from supine to sitting EOB.  Able to maintain static sitting balance with close supervision EOB but exhibits increased posterior pelvic tilt and increased lean backwards.  Needed total assist to integrate the LUE into the bathing task as well as positioning for the LUE as well.  Total assist to perfrom small scoot up toward the EOB.  Pt with decreased forward trunk flexion for scooting.  Returned to supine rolling to pull pants over his hips with max assist.  Pt able to roll to the right side with total assist and to the left with min assist using the rail and max instructional cueing for technique. Pt able to place his RLE in the pant leg in sitting and pull up to his knees.  Needed total assist to place his LLE in the pants leg and pull up to knees.     Therapy Documentation Precautions:  Precautions Precautions:  Fall Precaution Comments: left hemiparesis Restrictions Weight Bearing Restrictions: No  Pain: Pain Assessment Pain Assessment: 0-10 Pain Score:   8 Pain Location: Hip Pain Orientation: Left ADL: See FIM for current functional status  Therapy/Group: Individual Therapy  Thurma Priego OTR/L 11/08/2011, 12:05 PM

## 2011-11-08 NOTE — Progress Notes (Signed)
Patient ID: Jeremy [name], male   DOB: 04/01/1965, 46 y.o.   MRN: [id number] Subjective/Complaints:L Hip pain with movement A 12 point review of systems has been performed and if not noted above is otherwise negative.   Objective: Vital Signs: Blood pressure 152/108, pulse 81, temperature 98 F (36.7 C), temperature source Oral, resp. rate 20, weight 122.4 kg (269 lb 13.5 oz), SpO2 96.00%. Dg Hip Complete Left  11/06/2011  *RADIOLOGY REPORT*  Clinical Data: Fall  LEFT HIP - COMPLETE 2+ VIEW  Comparison: None.  Findings: Four views of the left hip submitted.  No acute fracture or subluxation.  Mild spurring bilateral greater femoral trochanter.  IMPRESSION: No left hip acute fracture or subluxation.   Original Report Authenticated By: Natasha Mead, M.D.    No results found for this basename: WBC:2,HGB:2,HCT:2,PLT:2 in the last 72 hours No results found for this basename: NA:2,K:2,CL:2,CO2:2,GLUCOSE:2,BUN:2,CREATININE:2,CALCIUM:2 in the last 72 hours CBG (last 3)  No results found for this basename: GLUCAP:3 in the last 72 hours  Wt Readings from Last 3 Encounters:  11/06/11 122.4 kg (269 lb 13.5 oz)  10/18/11 121.1 kg (266 lb 15.6 oz)  10/18/11 121.1 kg (266 lb 15.6 oz)    Physical Exam:  Nursing note and vitals reviewed.  Constitutional: He is oriented to person, place, and time. He appears well-developed and well-nourished.  Morbidly obese male  .   HENT:  Head: Normocephalic and atraumatic.   Marland Kitchen Poor dentition. Missing multiple teeth. Vocal quality much improved. Eyes: Pupils are equal, round, and reactive to light.  Unable to close left eye. Dysconjugate gaze, exopthalmus. Cannot gaze upward Neck:  trach Wound with some debris.   Cardiovascular: Normal rate and regular rhythm.  Pulmonary/Chest: Effort normal.   No distress. No cough. clear Abdominal: Soft. Bowel sounds are normal. He exhibits no distension. There is no tenderness.  PEG site clean and dry.  Musculoskeletal: He  exhibits tenderness (left knee with ROM.).  Neurological: He is alert and oriented to person, place, and time. He displays abnormal reflex.   e. Polite and appropriate. Follows basic commands without difficulty.  . Left facial weakness. Speech quality and volume much better. Unable to look upward. Dense left hemiparesis with flexor tone at knee.   clonus left foot 1-2beats. Senses pain in left leg and arm. LUE   trace. LLE 0-1 on MMT. Left arm and leg are easily moveable Skin: Skin is warm and dry. Trach site pink granulation tissue L Hip mild paion with ROM Assessment/Plan: 1. 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 Physiatrist is providing close team supervision and 24 hour management of active medical problems listed below. Physiatrist and rehab team continue to assess barriers to discharge/monitor patient progress toward functional and medical goals. FIM: FIM - Bathing Bathing Steps Patient Completed:  (night bath) Bathing: 2: Max-Patient completes 3-4 8f 10 parts or 25-49%  FIM - Upper Body Dressing/Undressing Upper body dressing/undressing steps patient completed: Put head through opening of pull over shirt/dress Upper body dressing/undressing: 1: Total-Patient completed less than 25% of tasks FIM - Lower Body Dressing/Undressing Lower body dressing/undressing steps patient completed: Thread/unthread right pants leg Lower body dressing/undressing: 1: Two helpers  FIM - Toileting Toileting steps completed by patient: Performs perineal hygiene Toileting: 2: Max-Patient completed 1 of 3 steps  FIM - Diplomatic Services operational officer Devices: Human resources officer Transfers:  2-To toilet/BSC: Max A (lift and lower assist);2-From toilet/BSC: Max A (lift and lower assist) (To pt's R)  FIM -  Banker Devices: Sliding board;Arm rests Bed/Chair Transfer: 1: Mechanical lift  FIM - Locomotion: Wheelchair Distance: 120 Locomotion: Wheelchair: 2: Travels 50 - 149 ft with moderate assistance (Pt: 50 - 74%) FIM - Locomotion: Ambulation Locomotion: Ambulation: 0: Activity did not occur  Comprehension Comprehension Mode: Auditory Comprehension: 4-Understands basic 75 - 89% of the time/requires cueing 10 - 24% of the time  Expression Expression Mode: Verbal Expression Assistive Devices: 6-Talk trach valve Expression: 3-Expresses basic 50 - 74% of the time/requires cueing 25 - 50% of the time. Needs to repeat parts of sentences.  Social Interaction Social Interaction: 4-Interacts appropriately 75 - 89% of the time - Needs redirection for appropriate language or to initiate interaction.  Problem Solving Problem Solving: 3-Solves basic 50 - 74% of the time/requires cueing 25 - 49% of the time  Memory Memory: 3-Recognizes or recalls 50 - 74% of the time/requires cueing 25 - 49% of the time  Medical Problem List and Plan:  1. DVT Prophylaxis/Anticoagulation: Pharmaceutical: Lovenox  2. Pain Management:    baclofen for spasticity and voltaren gel for left knee pain. , Hip pain  Xray-, may be some neuro pain on left. Add neurontin 3. Mood: No signs of distress noted.  4. Neuropsych: This patient is is not capable of making decisions on his/her own behalf.  5. HTN: Monitor with bid checks. continue metoprolol bid. Trending down. No further changes yet 6. ABLA:  .   iron supplement if tolerating new diet without recurrent N/V  -labs better 7. Tone:  baclofen trial has helped.--increased to qid.  PRAFO for LLW 8. Pulmonary:doing well s/p self-decannulation  -needs to keep dressing on but frequently pulls at it 9. FEN: eating fairly well  -PEG needs to stay in 4-6 weeks total LOS (Days) 17 A FACE TO FACE EVALUATION WAS  PERFORMED  Teshia Mahone T 11/08/2011, 7:48 AM

## 2011-11-08 NOTE — Progress Notes (Signed)
Nutrition Follow-up  Intervention:  Continue Ensure Complete PO BID and Prostat 30 ml BID to maximize oral intake.  Assessment:   Continues to consume 50-100% of meals and drinking one Ensure Complete daily. Weight relatively stable - trending up.  Diet Order:  Regular  Supplement: Ensure Complete BID and Prostat BID  Meds: Scheduled Meds:    . antiseptic oral rinse  15 mL Mouth Rinse TID WC & HS  . baclofen  10 mg Oral QID  . diclofenac sodium  2 g Topical QID  . enoxaparin  40 mg Subcutaneous Q24H  . feeding supplement  237 mL Oral BID BM  . feeding supplement  30 mL Oral BID  . gabapentin  100 mg Oral TID  . lisinopril  5 mg Oral BID  . metoprolol tartrate  50 mg Oral Q12H  . multivitamin with minerals  1 tablet Oral Daily  . naphazoline-glycerin  2 drop Left Eye QID  . senna-docusate  1 tablet Oral BID  . DISCONTD: lisinopril  5 mg Oral Daily   Continuous Infusions:  PRN Meds:.acetaminophen, alum & mag hydroxide-simeth, baclofen, bisacodyl, cloNIDine, diphenhydrAMINE, guaiFENesin-dextromethorphan, oxyCODONE, prochlorperazine, prochlorperazine, prochlorperazine, sodium chloride, traMADol, traZODone  Labs:  CMP     Component Value Date/Time   NA 136 10/25/2011 1115   K 3.8 10/25/2011 1115   CL 100 10/25/2011 1115   CO2 28 10/25/2011 1115   GLUCOSE 102* 10/25/2011 1115   BUN 17 10/25/2011 1115   CREATININE 0.84 10/25/2011 1115   CALCIUM 9.9 10/25/2011 1115   PROT 7.8 10/25/2011 1115   ALBUMIN 2.9* 10/25/2011 1115   AST 50* 10/25/2011 1115   ALT 77* 10/25/2011 1115   ALKPHOS 102 10/25/2011 1115   BILITOT 0.4 10/25/2011 1115   GFRNONAA >90 10/25/2011 1115   GFRAA >90 10/25/2011 1115     Intake/Output Summary (Last 24 hours) at 11/08/11 1405 Last data filed at 11/08/11 1354  Gross per 24 hour  Intake   1080 ml  Output    825 ml  Net    255 ml  BM 9/21  Weight:  122.4 kg (9/21) - wt trending up  Re-estimated needs, unchanged:  2200-2400 kcals, 115-130 grams protein, 2.2-2.4 liters fluid  daily  Nutrition Dx:  Inadequate oral intake, resolved.  Goal:  Pt to meet >/= 90% of their estimated nutrition needs, met.  Monitor:  PO intake, weight trend, labs  Jarold Motto MS, RD, LDN Pager: [phone] After-hours pager: [phone]

## 2011-11-08 NOTE — Progress Notes (Signed)
Physical Therapy Session Note  Patient Details  Name: Jeremy [name] MRN: [id number] Date of Birth: Jul 24, 1965  Today's Date: 11/08/2011 Time: 4098-1191 Time Calculation (min): 54 min  Short Term Goals: Week 2:  PT Short Term Goal 1 (Week 2): Patient will perform bed mobility with max A and max verbal cues for initiation and sequencing  PT Short Term Goal 2 (Week 2): Patient will transfer wheelchair to bed and mat with sliding board and max assistance of 1.  PT Short Term Goal 3 (Week 2): Patient will propell wheelchair 100 feet on level tile surfaces in controlled environment using right extremities with supervision.  PT Short Term Goal 4 (Week 2): Patient will stand with UE support and max assist for 2 minutes to assist with caregiver.  Skilled Therapeutic Interventions/Progress Updates:    This afternoon the pt reported that he still felt nauseated.  He reports throwing up "all over the place" this morning and still feeling a little sick to his stomach now.  He was able to complete the therapy session without any incident.  We worked on bed mobility, transfers into and out of the [facility] using the sliding board (from high to low and low to high both going towards his right side).  WC mobility from room to gym with assist needed to avoid hitting things on his right.  His wife is to bring his tennis shoes tomorrow which may help him use his right foot more to help steer the WC.  Stood in standing frame working on endurance, upright posture and weight shifting by reaching for objects both right and left overhead.  He also played connect 4 after reporting he wanted to sit down as a distraction and reaching tool.   He stood a total of 8 mins x 1.  It took two person assist to get from low WC back into the bed and to get positioned from sitting to supine in the bed. Continued reports of left leg and hip pain.  Pt doing more to help with mobility and set up of WC and slide board this PM.  See details below.    Therapy Documentation Precautions:  Precautions Precautions: Fall Precaution Comments: left hemiparesis Restrictions Weight Bearing Restrictions: No   Vital Signs: Therapy Vitals BP: 158/88 mmHg Pain: Pain Assessment Pain Assessment: 0-10 Pain Score: 10-Worst pain ever Pain Type: Acute pain Pain Location: Hip Pain Orientation: Left Pain Radiating Towards: leg Pain Intervention(s): Repositioned;Ambulation/increased activity Mobility: Bed Mobility Rolling Right: 2: Max assist;With rail Rolling Left: 3: Mod assist;With rail Left Sidelying to Sit: 3: Mod assist;HOB elevated;With rails Sitting - Scoot to Edge of Bed: 3: Mod assist Sit to Supine: 1: +2 Total assist;With rail Transfers Lateral/Scoot Transfers: 3: Mod assist;Not tested (comment);With armrests removed;With slide board;1: +2 Total assist Locomotion : Wheelchair Mobility Wheelchair Mobility: Yes Wheelchair Assistance: 3: Mod Education officer, museum: Right upper extremity;Right lower extremity Wheelchair Parts Management: Needs assistance Distance: 150    Other Treatments: Treatments Therapeutic Activity: Stood in standing frame x 8 mins working on upright posture weight shifting left and right and endurance.  Pt fatigued quickly and asked to sit at 6 mins, but was able to continue with distraction (connect 4 game).    See FIM for current functional status  Therapy/Group: Individual Therapy  Jeremy [name], PT, DPT [phone]   11/08/2011, 3:07 PM

## 2011-11-08 NOTE — Progress Notes (Signed)
Speech Language Pathology Daily Session Note  Patient Details  Name: Jeremy [name] MRN: [id number] Date of Birth: Jun 08, 1965  Today's Date: 11/08/2011 Time: 1335-1400 Time Calculation (min): 25 min  Short Term Goals: Week 3: SLP Short Term Goal 1 (Week 3): Pt will demonstrate functional problem solving for functional and familiar tasks with Min A verbal and question cues.  SLP Short Term Goal 2 (Week 3): Pt will demonstrate selective attention in a mildly distracting enviornment for 10 minutes with Mod A verbal cues for redirection.  SLP Short Term Goal 3 (Week 3): Pt will attend to left enviornment/body with Min A verbal and question cues.  SLP Short Term Goal 4 (Week 3): Pt will initiate functional tasks with Mod I.   Skilled Therapeutic Interventions: Treatment focus on mildly complex problem solving, left attention and working memory. Patient participated by recalling a relatively new task with a mildly complex card game and completed task with Min verbal and question cues for problem solving and to recall directions of task.    FIM:  Comprehension Comprehension Mode: Auditory Comprehension: 5-Understands basic 90% of the time/requires cueing < 10% of the time Expression Expression Mode: Verbal Expression: 4-Expresses basic 75 - 89% of the time/requires cueing 10 - 24% of the time. Needs helper to occlude trach/needs to repeat words. Social Interaction Social Interaction: 5-Interacts appropriately 90% of the time - Needs monitoring or encouragement for participation or interaction. Problem Solving Problem Solving: 4-Solves basic 75 - 89% of the time/requires cueing 10 - 24% of the time Memory Memory: 4-Recognizes or recalls 75 - 89% of the time/requires cueing 10 - 24% of the time FIM - Eating Eating Activity: 5: Set-up assist for open containers  Pain Pain Assessment Pain Assessment: No/denies pain  Therapy/Group: Individual Therapy  Charlane Ferretti.,  CCC-SLP [phone]  Jeremy [name] 11/08/2011, 4:09 PM

## 2011-11-09 ENCOUNTER — Inpatient Hospital Stay (HOSPITAL_COMMUNITY): Payer: BC Managed Care – PPO

## 2011-11-09 ENCOUNTER — Inpatient Hospital Stay (HOSPITAL_COMMUNITY): Payer: BC Managed Care – PPO | Admitting: Speech Pathology

## 2011-11-09 ENCOUNTER — Inpatient Hospital Stay (HOSPITAL_COMMUNITY): Payer: BC Managed Care – PPO | Admitting: Occupational Therapy

## 2011-11-09 MED ORDER — TRAMADOL HCL 50 MG PO TABS
50.0000 mg | ORAL_TABLET | Freq: Four times a day (QID) | ORAL | Status: DC
  Administered 2011-11-09 – 2011-11-16 (×25): 50 mg via ORAL
  Filled 2011-11-09 (×25): qty 1

## 2011-11-09 NOTE — Progress Notes (Signed)
Physical Therapy Session Note  Patient Details  Name: Jeremy [name] MRN: [id number] Date of Birth: 04/26/1965  Today's Date: 11/09/2011 Time: 1430-1520 Time Calculation (min): 50 min  Short Term Goals: Week 2:  PT Short Term Goal 1 (Week 2): Patient will perform bed mobility with max A and max verbal cues for initiation and sequencing  PT Short Term Goal 2 (Week 2): Patient will transfer wheelchair to bed and mat with sliding board and max assistance of 1.  PT Short Term Goal 3 (Week 2): Patient will propell wheelchair 100 feet on level tile surfaces in controlled environment using right extremities with supervision.  PT Short Term Goal 4 (Week 2): Patient will stand with UE support and max assist for 2 minutes to assist with caregiver.  Skilled Therapeutic Interventions/Progress Updates:  Patient propelled wheelchair 140 feet on level tile with min assist and cueing to avoid obstacles. Patient transferred wheelchair to and from mat using sliding board with moderate assist. Patient is able to place the board when going to right. Therapist attempted sit to stand using maxi sky lift in gym up to eva walker but patient unable to get to full standing.  With additional assistance (+3), patient able to stand with EVA walker with +1 for total hip and knee extension on left. Patient able to step forward and back with right LE 6 steps. Patient transferred wheelchair to bed with sliding board and +1 moderate assist to right. Patient sit to supine with +1 mod assist to position into bed.  Therapy Documentation Precautions:  Precautions Precautions: Fall Precaution Comments: left hemiparesis Restrictions Weight Bearing Restrictions: No  Pain: Pain Assessment Pain Assessment: 0-10 Pain Score:   8 Pain Type: Acute pain Pain Location: Hip Pain Orientation: Left Pain Descriptors: Aching Pain Frequency: Occasional Pain Onset: Gradual Patients Stated Pain Goal: 1 Pain Intervention(s): Medication  (See eMAR) (ultram 50 mgpo)  See FIM for current functional status  Therapy/Group: Individual Therapy  [name] 11/09/2011, 4:32 PM

## 2011-11-09 NOTE — Progress Notes (Signed)
Physical Therapy Session Note  Patient Details  Name: Jeremy [name] MRN: [id number] Date of Birth: [birth year]  Today's Date: 11/09/2011 Time: 1130-1200 Time Calculation (min): 30 min  Short Term Goals: Week 2:  PT Short Term Goal 1 (Week 2): Patient will perform bed mobility with max A and max verbal cues for initiation and sequencing  PT Short Term Goal 2 (Week 2): Patient will transfer wheelchair to bed and mat with sliding board and max assistance of 1.  PT Short Term Goal 3 (Week 2): Patient will propell wheelchair 100 feet on level tile surfaces in controlled environment using right extremities with supervision.  PT Short Term Goal 4 (Week 2): Patient will stand with UE support and max assist for 2 minutes to assist with caregiver.  Skilled Therapeutic Interventions/Progress Updates:  Patient had tennis shoes which greatly helped wheelchair propulsion. Patient can propel wheelchair on level tile surfaces with min assist 140 feet. Patient needs cueing to stay to right and not to bump into objects on left. Patient transferred into and out of simulated car with sliding board and max assist x 1.   Therapy Documentation Precautions:  Precautions Precautions: Fall Precaution Comments: left hemiparesis Restrictions Weight Bearing Restrictions: No  Pain: Pain Assessment Pain Assessment: 0-10 Pain Score:   8 Pain Type: Acute pain Pain Location: Hip Pain Orientation: Left Pain Descriptors: Aching Pain Frequency: Occasional Pain Onset: Gradual Patients Stated Pain Goal: 1 Pain Intervention(s): Medication (See eMAR) (ultram 50 mgpo)  See FIM for current functional status  Therapy/Group: Individual Therapy  [name] 11/09/2011, 4:17 PM

## 2011-11-09 NOTE — Progress Notes (Signed)
Speech Language Pathology Daily Session Note  Patient Details  Name: Roverto Bodmer MRN: [id number] Date of Birth: 07-Jul-1965  Today's Date: 11/09/2011 Time: 8295-6213 Time Calculation (min): 30 min  Short Term Goals: Week 3: SLP Short Term Goal 1 (Week 3): Pt will demonstrate functional problem solving for functional and familiar tasks with Min A verbal and question cues.  SLP Short Term Goal 2 (Week 3): Pt will demonstrate selective attention in a mildly distracting enviornment for 10 minutes with Mod A verbal cues for redirection.  SLP Short Term Goal 3 (Week 3): Pt will attend to left enviornment/body with Min A verbal and question cues.  SLP Short Term Goal 4 (Week 3): Pt will initiate functional tasks with Mod I.   Skilled Therapeutic Interventions: Treatment focus on mildly complex organization and problem solving. Pt required Mod semantic and question cues for problem solving with mildly complex scheduling task. Pt also required Min question and visual cues for scanning during reading task. Pt demonstrated selective attention to task for 20 minutes with supervision verbal cues for redirection.    FIM:  Comprehension Comprehension Mode: Auditory Comprehension: 5-Understands basic 90% of the time/requires cueing < 10% of the time Expression Expression Mode: Verbal Expression: 5-Expresses basic 90% of the time/requires cueing < 10% of the time. Social Interaction Social Interaction: 5-Interacts appropriately 90% of the time - Needs monitoring or encouragement for participation or interaction. Problem Solving Problem Solving: 3-Solves basic 50 - 74% of the time/requires cueing 25 - 49% of the time Memory Memory: 3-Recognizes or recalls 50 - 74% of the time/requires cueing 25 - 49% of the time  Pain Pain Assessment Pain Assessment: No/denies pain Pain Score:   1 Faces Pain Scale: Hurts little more Pain Type: Acute pain Pain Location: Hip Pain Orientation: Left Pain  Descriptors: Aching Pain Frequency: Occasional Pain Onset: Gradual Patients Stated Pain Goal: 2 Pain Intervention(s): Medication (See eMAR);Repositioned  Therapy/Group: Individual Therapy  Maylie Ashton 11/09/2011, 11:57 AM

## 2011-11-09 NOTE — Progress Notes (Signed)
Occupational Therapy Session Note  Patient Details  Name: Lemond Mckinney MRN: [id number] Date of Birth: Sep 22, 1965  Today's Date: 11/09/2011 Time: 0800-0858 Time Calculation (min): 58 min  Short Term Goals: Week 2:  OT Short Term Goal 1 (Week 2): Patient will transfer weight forward in sitting with mod assist in preparation for sit to stand OT Short Term Goal 1 - Progress (Week 2): Met OT Short Term Goal 2 (Week 2): Patient will utilize grab bar effectively to transition from sit to squat for LB bathing with max assist OT Short Term Goal 2 - Progress (Week 2): Met OT Short Term Goal 3 (Week 2): Patient will sustain attention to grooming or meal task for more than 5 min with min prompting OT Short Term Goal 3 - Progress (Week 2): Met OT Short Term Goal 4 (Week 2): Patient will effectively utilize right arm and leg to roll to left with min assist to aide with bathing  OT Short Term Goal 4 - Progress (Week 2): Met  Skilled Therapeutic Interventions/Progress Updates:    Performed bathing and dressing sit to supine EOB.  Pt required min assist for supine to left side using bed rail.  Total assist for transition from sidelying to sitting.  Able to sit during session with close supervision while engaging in bathing task.  Total assist to integrate the LUE as a stabilizer or active assist for holding washcloth and washing the right arm.  Performed standing to attempt perianal hygiene but pt unable to maintain secondary to hip pain and weakness.  Returned to supine and rolling to clean peri area and apply brief and pants.  Max assist needed to roll to the right side.  In sitting pt needed max instructional cues for donning pullover shirt, but only needed min assist for threading the left arm through his shirt hole.  Pt performed sliding board transfer to wheelchair with total +2(pt 30%).  Pt impulsive and demonstrates decreased forward trunk flexion for transfer.  Therapy Documentation Precautions:    Precautions Precautions: Fall Precaution Comments: left hemiparesis Restrictions Weight Bearing Restrictions: No  Pain: Pain Assessment Pain Assessment: Faces Pain Score:   1 Faces Pain Scale: Hurts little more Pain Type: Acute pain Pain Location: Hip Pain Orientation: Left Pain Descriptors: Aching Pain Frequency: Occasional Pain Onset: Gradual Patients Stated Pain Goal: 2 Pain Intervention(s): Medication (See eMAR);Repositioned ADL: See FIM for current functional status  Therapy/Group: Individual Therapy  Derrika Ruffalo OTR/L 11/09/2011, 11:15 AM

## 2011-11-09 NOTE — Progress Notes (Signed)
Patient ID: Jeremy [name], male   DOB: [birth year], 46 y.o.   MRN: [id number] Subjective/Complaints:L vomited last night   A 12 point review of systems has been performed and if not noted above is otherwise negative.   Objective: Vital Signs: Blood pressure 135/82, pulse 76, temperature 97.9 F (36.6 C), temperature source Oral, resp. rate 19, weight 122.4 kg (269 lb 13.5 oz), SpO2 96.00%. No results found. No results found for this basename: WBC:2,HGB:2,HCT:2,PLT:2 in the last 72 hours No results found for this basename: NA:2,K:2,CL:2,CO2:2,GLUCOSE:2,BUN:2,CREATININE:2,CALCIUM:2 in the last 72 hours CBG (last 3)  No results found for this basename: GLUCAP:3 in the last 72 hours  Wt Readings from Last 3 Encounters:  11/06/11 122.4 kg (269 lb 13.5 oz)  10/18/11 121.1 kg (266 lb 15.6 oz)  10/18/11 121.1 kg (266 lb 15.6 oz)    Physical Exam:  Nursing note and vitals reviewed.  Constitutional: He is oriented to person, place, and time. He appears well-developed and well-nourished.  Morbidly obese male  .   HENT:  Head: Normocephalic and atraumatic.   Marland Kitchen Poor dentition. Missing multiple teeth. Vocal quality much improved. Eyes: Pupils are equal, round, and reactive to light.  Unable to close left eye. Dysconjugate gaze, exopthalmus. Cannot gaze upward Neck:  trach Wound with some debris.   Cardiovascular: Normal rate and regular rhythm.  Pulmonary/Chest: Effort normal.   No distress. No cough. clear Abdominal: Soft. Bowel sounds are normal. He exhibits no distension. There is no tenderness.  PEG site clean and dry.  Musculoskeletal: He exhibits tenderness (left knee with ROM.).  Neurological: He is alert and oriented to person, place, and time. He displays abnormal reflex.   e. Polite and appropriate. Follows basic commands without difficulty.  . Left facial weakness. Speech quality and volume much better. Unable to look upward. Dense left hemiparesis with flexor tone at knee.    clonus left foot 1-2beats. Senses pain in left leg and arm. LUE   trace. LLE 0-1 on MMT. Left arm and leg are easily moveable Skin: Skin is warm and dry. Trach site pink granulation tissue L Hip mild paion with ROM Assessment/Plan: 1. 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 Physiatrist is providing close team supervision and 24 hour management of active medical problems listed below. Physiatrist and rehab team continue to assess barriers to discharge/monitor patient progress toward functional and medical goals. FIM: FIM - Bathing Bathing Steps Patient Completed: Chest;Left Arm;Abdomen;Left upper leg;Right upper leg Bathing: 3: Mod-Patient completes 5-7 52f 10 parts or 50-74%  FIM - Upper Body Dressing/Undressing Upper body dressing/undressing steps patient completed: Put head through opening of pull over shirt/dress Upper body dressing/undressing: 2: Max-Patient completed 25-49% of tasks FIM - Lower Body Dressing/Undressing Lower body dressing/undressing steps patient completed: Thread/unthread right pants leg Lower body dressing/undressing: 1: Total-Patient completed less than 25% of tasks  FIM - Toileting Toileting steps completed by patient: Performs perineal hygiene Toileting: 0: Activity did not occur  FIM - Diplomatic Services operational officer Devices: Human resources officer Transfers: 0-Activity did not occur  FIM - Banker Devices: Bed rails;Arm rests;Sliding board;HOB elevated Bed/Chair Transfer: 1: Two helpers;1: Mechanical lift  FIM - Locomotion: Wheelchair Distance: 150 Locomotion: Wheelchair: 1: Total Assistance/staff pushes wheelchair (Pt<25%) FIM - Locomotion: Ambulation Locomotion: Ambulation: 0: Activity did not  occur  Comprehension Comprehension Mode: Auditory Comprehension:  5-Understands basic 90% of the time/requires cueing < 10% of the time  Expression Expression Mode: Verbal Expression Assistive Devices: 6-Talk trach valve Expression: 4-Expresses basic 75 - 89% of the time/requires cueing 10 - 24% of the time. Needs helper to occlude trach/needs to repeat words.  Social Interaction Social Interaction: 5-Interacts appropriately 90% of the time - Needs monitoring or encouragement for participation or interaction.  Problem Solving Problem Solving: 4-Solves basic 75 - 89% of the time/requires cueing 10 - 24% of the time  Memory Memory: 4-Recognizes or recalls 75 - 89% of the time/requires cueing 10 - 24% of the time  Medical Problem List and Plan:  1. DVT Prophylaxis/Anticoagulation: Pharmaceutical: Lovenox  2. Pain Management:    baclofen for spasticity and voltaren gel for left knee pain. , Hip pain  Xray-, may be some neuro pain on left. Added neurontin-titrate as needed 3. Mood: No signs of distress noted.  4. Neuropsych: This patient is is not capable of making decisions on his/her own behalf.  5. HTN: Monitor with bid checks. continue metoprolol bid. Trending down. No further changes yet 6. ABLA:  .   iron supplement if tolerating new diet without recurrent N/V  -labs better  -back off multivit due to n/v 7. Tone:  baclofen trial has helped.--increased to qid.  PRAFO for LLW 8. Pulmonary:doing well s/p self-decannulation  -needs to keep dressing on but frequently pulls at it 9. FEN: eating fairly well  -PEG needs to stay in 4-6 weeks total LOS (Days) 18 A FACE TO FACE EVALUATION WAS PERFORMED  Javaun Dimperio T 11/09/2011, 7:28 AM

## 2011-11-09 NOTE — Patient Care Conference (Signed)
Inpatient RehabilitationTeam Conference Note Date: 11/09/2011   Time: 2:45 PM    Patient Name: Jeremy [name]      Medical Record Number: [id number]  Date of Birth: 1965/06/06 Sex: Male         Room/Bed: 4001/4001-02 Payor Info: Payor: BLUE CROSS BLUE SHIELD  Plan: [facility] [employer] PPO  Product Type: *No Product type*     Admitting Diagnosis: ICH,TRACH  Admit Date/Time:  10/22/2011  4:28 PM Admission Comments: No comment available   Primary Diagnosis:  ICH (intracerebral hemorrhage) Principal Problem: ICH (intracerebral hemorrhage)  Patient Active Problem List   Diagnosis Date Noted  . Spasticity 10/29/2011  . 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 Dysphagia, PEG dependent 10/20/2011  . Fever 10/16/2011  . Cytotoxic cerebral edema 10/02/2011  . Obstructive hydrocephalus 10/02/2011  . ICH (intracerebral hemorrhage) 10/01/2011  . HTN (hypertension) 10/01/2011    Expected Discharge Date: Expected Discharge Date:  (SNF)  Team Members Present: Physician: Dr. Faith Rogue Nurse Present: Carmie End, RN PT Present: Reggy Eye, PT OT Present: Mackie Pai, Marye Round, OT SLP Present: Feliberto Gottron, SLP Other (Discipline and Name): Tora Duck, PPS Coordinator     Current Status/Progress Goal Weekly Team Focus  Medical   trach stoma closing. some pain issues on left. spasticity better. watching bp  bp control and pain control   see above   Bowel/Bladder   incontinent bowel and bladder wears brief condom cathetr at hs toileting evry q3 hrs LBM 11/08/11  mod assist      Swallow/Nutrition/ Hydration              ADL's   min assist UB bathing and dressing, total +2 for LB bathing and dressing sit to supine.  Unable to maintain standing secondary to pain in left hip and weakness when attempted during ADL.  Mod assist for grooming tasks.  Brunnstrum stage II in the left arm and hand.  overall min assist for UB selfcare tasks, and mod assist for LB selfcare, toileting   selfcare retraining, neuromuscular re-education for trunk and LUE/LLE, pt education   Mobility   Pt performing sliding board transfers with max assist +1 to right. Patient standing in standing frame 6 to 9 minutes each time. Propelling wheelchair on level tile surfaces with mod assist. about 100 feet.  LTG's changed to mod to max assist +1 for mobility  transfers and wheelchair mobility   Communication   Min A-Supervision  supervison-Mod I  utilization of increased vocal intensity, comprehension of mildly complex information    Safety/Cognition/ Behavioral Observations  Min-Mod A  Min A  working memory with utilization of strategies, awareness, attention, problem solving    Pain   oxycodone 5-10  right ankle pain voltaren gel to right knee   less than 3      Skin   Peg site small amount yellow drainage  no new drainage       Rehab Goals Patient on target to meet rehab goals: Yes *See Interdisciplinary Assessment and Plan and progress notes for long and short-term goals  Barriers to Discharge: profound neuro deficits    Possible Resolutions to Barriers:  full time care/supervision    Discharge Planning/Teaching Needs:  plan changed to SNF      Team Discussion:  D/c plan changed to SNF.  Have downgraded goals, however, pt does continue to make good progress which could be maximized with longer term SNF stay  Revisions to Treatment Plan:  Mobility goals revised (see above)   Continued  Need for Acute Rehabilitation Level of Care: The patient requires daily medical management by a physician with specialized training in physical medicine and rehabilitation for the following conditions: Daily direction of a multidisciplinary physical rehabilitation program to ensure safe treatment while eliciting the highest outcome that is of practical value to the patient.: Yes Daily medical management of patient stability for increased activity during participation in an intensive rehabilitation  regime.: Yes Daily analysis of laboratory values and/or radiology reports with any subsequent need for medication adjustment of medical intervention for : Neurological problems;Pulmonary problems;Other  Jeremy [name] 11/09/2011, 3:36 PM

## 2011-11-09 NOTE — Plan of Care (Signed)
Problem: RH Balance Goal: LTG Patient will maintain dynamic standing balance (PT) LTG: Patient will maintain dynamic standing balance with assistance during mobility activities (PT)  Outcome: Not Applicable Date Met:  11/09/11 Goal discontinued secondary to limited progress.  Problem: RH Ambulation Goal: LTG Patient will ambulate in controlled environment (PT) LTG: Patient will ambulate in a controlled environment, # of feet with assistance (PT).  Outcome: Not Applicable Date Met:  11/09/11 Ambulation goal discontinued secondary to slow progress with standing.  Comments:  Standing and ambulation goals due to patients slow progress, dense hemiplegia, decreased attention and initiation.

## 2011-11-10 ENCOUNTER — Inpatient Hospital Stay (HOSPITAL_COMMUNITY): Payer: BC Managed Care – PPO | Admitting: Physical Therapy

## 2011-11-10 ENCOUNTER — Inpatient Hospital Stay (HOSPITAL_COMMUNITY): Payer: BC Managed Care – PPO | Admitting: Occupational Therapy

## 2011-11-10 ENCOUNTER — Inpatient Hospital Stay (HOSPITAL_COMMUNITY): Payer: BC Managed Care – PPO | Admitting: Speech Pathology

## 2011-11-10 DIAGNOSIS — I10 Essential (primary) hypertension: Secondary | ICD-10-CM

## 2011-11-10 DIAGNOSIS — Z5189 Encounter for other specified aftercare: Secondary | ICD-10-CM

## 2011-11-10 DIAGNOSIS — J9600 Acute respiratory failure, unspecified whether with hypoxia or hypercapnia: Secondary | ICD-10-CM

## 2011-11-10 DIAGNOSIS — I619 Nontraumatic intracerebral hemorrhage, unspecified: Secondary | ICD-10-CM

## 2011-11-10 NOTE — Progress Notes (Signed)
Recreational Therapy Session Note  Patient Details  Name: Jeremy [name] MRN: [id number] Date of Birth: 20-Jun-1965 Today's Date: 11/10/2011  Pain: no c/o Skilled Therapeutic Interventions/Progress Updates: Pt participated in animal assisted activity/therapy bed level working on attention to the left with min cues and selective attention to task.  Nathanyl Andujo 11/10/2011, 5:35 PM

## 2011-11-10 NOTE — Progress Notes (Signed)
Occupational Therapy Session Note  Patient Details  Name: Jeremy [name] MRN: [id number] Date of Birth: Jan 08, 1966  Today's Date: 11/10/2011 Time: 0800-0902 Time Calculation (min): 62 min  Short Term Goals: Week 2:  OT Short Term Goal 1 (Week 2): Patient will transfer weight forward in sitting with mod assist in preparation for sit to stand OT Short Term Goal 1 - Progress (Week 2): Met OT Short Term Goal 2 (Week 2): Patient will utilize grab bar effectively to transition from sit to squat for LB bathing with max assist OT Short Term Goal 2 - Progress (Week 2): Met OT Short Term Goal 3 (Week 2): Patient will sustain attention to grooming or meal task for more than 5 min with min prompting OT Short Term Goal 3 - Progress (Week 2): Met OT Short Term Goal 4 (Week 2): Patient will effectively utilize right arm and leg to roll to left with min assist to aide with bathing  OT Short Term Goal 4 - Progress (Week 2): Met  Skilled Therapeutic Interventions/Progress Updates:    Bathing and dressing at EOB.  Upon entering room pt had vomited all of his breakfast up on himself.  Worked on cleaning him up some in order to get to the point we could transfer to the edge of the bed.  Once sitting on EOB he was able to maintain sitting balance with close supervision while working on UB and LB bathing.  Occasional increased posterior lean in sitting at times when attempting to wash his right arm, with therapist helping.  Used [place] Dec Plus for sit to stand for peri washing and donning pants un undergarment protector over her hips.  Pt able to maintain standing in [place] Plus for 30 seconds before collapsing down into the machine and needing to sit down.  Total assist to integrate the LUE into function.    Therapy Documentation Precautions:  Precautions Precautions: Fall Precaution Comments: left hemiparesis Restrictions Weight Bearing Restrictions: No  Pain: Pain Assessment Pain Assessment: Faces Faces Pain  Scale: Hurts a little bit Pain Type: Acute pain Pain Location: Hip Pain Orientation: Left Pain Intervention(s): Medication (See eMAR);Repositioned ADL: See FIM for current functional status  Therapy/Group: Individual Therapy  Carlyle Achenbach OTR/L 11/10/2011, 9:13 AM

## 2011-11-10 NOTE — Progress Notes (Signed)
Patient ID: Jeremy [name], male   DOB: 03/10/1965, 46 y.o.   MRN: [id number] Subjective/Complaints:no complaints today. Denies nausea.   A 12 point review of systems has been performed and if not noted above is otherwise negative.   Objective: Vital Signs: Blood pressure 128/69, pulse 74, temperature 98.6 F (37 C), temperature source Oral, resp. rate 18, weight 122.4 kg (269 lb 13.5 oz), SpO2 100.00%. No results found. No results found for this basename: WBC:2,HGB:2,HCT:2,PLT:2 in the last 72 hours No results found for this basename: NA:2,K:2,CL:2,CO2:2,GLUCOSE:2,BUN:2,CREATININE:2,CALCIUM:2 in the last 72 hours CBG (last 3)  No results found for this basename: GLUCAP:3 in the last 72 hours  Wt Readings from Last 3 Encounters:  11/06/11 122.4 kg (269 lb 13.5 oz)  10/18/11 121.1 kg (266 lb 15.6 oz)  10/18/11 121.1 kg (266 lb 15.6 oz)    Physical Exam:  Nursing note and vitals reviewed.  Constitutional: He is oriented to person, place, and time. He appears well-developed and well-nourished.  Morbidly obese male  .   HENT:  Head: Normocephalic and atraumatic.   Marland Kitchen Poor dentition. Missing multiple teeth. Vocal quality much improved. Eyes: Pupils are equal, round, and reactive to light.  Unable to close left eye. Dysconjugate gaze, exopthalmus. Cannot gaze upward Neck:  trach Wound with some debris.   Cardiovascular: Normal rate and regular rhythm.  Pulmonary/Chest: Effort normal.   No distress. No cough. clear Abdominal: Soft. Bowel sounds are normal. He exhibits no distension. There is no tenderness.  PEG site clean and dry.  Musculoskeletal: He exhibits tenderness (left knee with ROM.). Overall decreased pain with ROM. Neurological: He is alert and oriented to person, place, and time. He displays abnormal reflex.   e. Polite and appropriate. Follows basic commands without difficulty.  . Left facial weakness. Speech quality and volume much better. Unable to look upward. Dense left  hemiparesis with flexor tone at knee.   clonus left foot 1-2beats. Senses pain in left leg and arm. LUE   trace. LLE 0-1 on MMT. Left arm and leg are easily moveable Skin: Skin is warm and dry. Trach site pink granulation tissue L Hip mild paion with ROM Assessment/Plan: 1. 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 Physiatrist is providing close team supervision and 24 hour management of active medical problems listed below. Physiatrist and rehab team continue to assess barriers to discharge/monitor patient progress toward functional and medical goals. FIM: FIM - Bathing Bathing Steps Patient Completed: Chest;Left Arm;Abdomen;Front perineal area;Right upper leg;Left upper leg Bathing: 3: Mod-Patient completes 5-7 53f 10 parts or 50-74%  FIM - Upper Body Dressing/Undressing Upper body dressing/undressing steps patient completed: Thread/unthread right sleeve of pullover shirt/dresss;Put head through opening of pull over shirt/dress;Pull shirt over trunk Upper body dressing/undressing: 4: Min-Patient completed 75 plus % of tasks FIM - Lower Body Dressing/Undressing Lower body dressing/undressing steps patient completed: Thread/unthread right pants leg Lower body dressing/undressing: 1: Two helpers  FIM - Toileting Toileting steps completed by patient: Performs perineal hygiene Toileting: 0: Activity did not occur  FIM - Diplomatic Services operational officer Devices: Systems developer Transfers: 0-Activity did not occur  FIM - Banker Devices: Sliding board Bed/Chair Transfer: 3: Bed > Chair or W/C: Mod A (lift or lower assist);3: Chair or W/C > Bed: Mod A (lift or lower assist);3: Sit > Supine: Mod A (lifting assist/Pt.  50-74%/lift 2 legs)  FIM - Locomotion:  Wheelchair Distance: 150 Locomotion: Wheelchair: 2: Travels 50 - 149 ft with minimal assistance (Pt.>75%) FIM - Locomotion: Ambulation Locomotion: Ambulation: 0: Activity did not occur  Comprehension Comprehension Mode: Auditory Comprehension: 5-Understands complex 90% of the time/Cues < 10% of the time  Expression Expression Mode: Verbal Expression Assistive Devices: 6-Talk trach valve Expression: 5-Expresses complex 90% of the time/cues < 10% of the time  Social Interaction Social Interaction: 5-Interacts appropriately 90% of the time - Needs monitoring or encouragement for participation or interaction.  Problem Solving Problem Solving: 3-Solves basic 50 - 74% of the time/requires cueing 25 - 49% of the time  Memory Memory: 3-Recognizes or recalls 50 - 74% of the time/requires cueing 25 - 49% of the time  Medical Problem List and Plan:  1. DVT Prophylaxis/Anticoagulation: Pharmaceutical: Lovenox  2. Pain Management:    baclofen for spasticity and voltaren gel for left knee pain. , Hip pain  Xray-, may be some neuro pain on left. Added neurontin-he tells me it helped and he seems to be tolerating it a little better 3. Mood: No signs of distress noted.  4. Neuropsych: This patient is is not capable of making decisions on his/her own behalf.  5. HTN: Monitor with bid checks. continue metoprolol bid. Trending down. No further changes yet 6. ABLA:  .   iron supplement if tolerating new diet without recurrent N/V  -back off multivit due to n/v 7. Tone:  baclofen trial has helped.--increased to qid.  PRAFO for LLW 8. Pulmonary:doing well s/p self-decannulation  -needs to keep dressing on but frequently pulls at it 9. FEN: eating fairly well  -PEG needs to stay in 4-6 weeks total LOS (Days) 19 A FACE TO FACE EVALUATION WAS PERFORMED  SWARTZ,ZACHARY T 11/10/2011, 7:29 AM

## 2011-11-10 NOTE — Progress Notes (Signed)
Speech Language Pathology Daily Session Note  Patient Details  Name: Jeremy [name] MRN: [id number] Date of Birth: 03-06-1965  Today's Date: 11/10/2011 Time: 7829-5621 Time Calculation (min): 30 min  Short Term Goals: Week 3: SLP Short Term Goal 1 (Week 3): Pt will demonstrate functional problem solving for functional and familiar tasks with Min A verbal and question cues.  SLP Short Term Goal 2 (Week 3): Pt will demonstrate selective attention in a mildly distracting enviornment for 10 minutes with Mod A verbal cues for redirection.  SLP Short Term Goal 3 (Week 3): Pt will attend to left enviornment/body with Min A verbal and question cues.  SLP Short Term Goal 4 (Week 3): Pt will initiate functional tasks with Mod I.   Skilled Therapeutic Interventions: Treatment focus on working memory with utilization of compensatory strategies. Pt utilized memory strategy of "association" to increase recall of specific categories. Pt required Min verbal cues for verbal problem solving/reasoning to form an association and Mod A to utilize the association to increase recall during the memory task.    FIM:  Comprehension Comprehension Mode: Auditory Comprehension: 4-Understands basic 75 - 89% of the time/requires cueing 10 - 24% of the time Expression Expression Mode: Verbal Expression: 4-Expresses basic 75 - 89% of the time/requires cueing 10 - 24% of the time. Needs helper to occlude trach/needs to repeat words. Social Interaction Social Interaction: 5-Interacts appropriately 90% of the time - Needs monitoring or encouragement for participation or interaction. Problem Solving Problem Solving: 3-Solves basic 50 - 74% of the time/requires cueing 25 - 49% of the time Memory Memory: 3-Recognizes or recalls 50 - 74% of the time/requires cueing 25 - 49% of the time  Pain Pain Assessment Pain Assessment: 0-10 Pain Score:   3 Faces Pain Scale: Hurts little more Pain Type: Acute pain Pain Location:  Hip Pain Orientation: Left Pain Intervention(s): Medication (See eMAR) (scheduled Ultram and Baclofen)  Therapy/Group: Individual Therapy  Ziv Welchel 11/10/2011, 3:26 PM

## 2011-11-10 NOTE — Progress Notes (Signed)
Social Work Patient ID: Jeremy [name], male   DOB: [birth year], 46 y.o.   MRN: [id number]  Have met with patient and wife and reviewed team conference information - both pleased with patient's progress to date, however, plan continues to be SNF.  No bed offers yet at this point, but will keep staff posted.  [name]

## 2011-11-10 NOTE — Plan of Care (Signed)
Problem: RH BLADDER ELIMINATION Goal: RH STG MANAGE BLADDER WITH ASSISTANCE STG Manage Bladder With Max Assistance  Outcome: Not Progressing Condom catheter at hs. Patient is incontinent at hs.

## 2011-11-10 NOTE — Progress Notes (Signed)
Physical Therapy Session Note  Patient Details  Name: Jeremy [name] MRN: [id number] Date of Birth: 10/31/1965  Today's Date: 11/10/2011 Time: 1118-1205 Time Calculation (min): 47 min  Short Term Goals: Week 2:  PT Short Term Goal 1 (Week 2): Patient will perform bed mobility with max A and max verbal cues for initiation and sequencing  PT Short Term Goal 2 (Week 2): Patient will transfer wheelchair to bed and mat with sliding board and max assistance of 1.  PT Short Term Goal 3 (Week 2): Patient will propell wheelchair 100 feet on level tile surfaces in controlled environment using right extremities with supervision.  PT Short Term Goal 4 (Week 2): Patient will stand with UE support and max assist for 2 minutes to assist with caregiver.  Skilled Therapeutic Interventions/Progress Updates:    This session focused on being more independent with WC mobility, level transfers into and out of the [facility], bed mobility on the mat table (flat surface no rails), and dynamic sitting balance.  See details below.    Therapy Documentation Precautions:  Precautions Precautions: Fall Precaution Comments: left hemiparesis Restrictions Weight Bearing Restrictions: No Pain: Pain Assessment Pain Assessment: 0-10 Pain Score: 0-No pain (no pain at rest, pain 8/10 with movement of left leg) Mobility: Bed Mobility Rolling Left: 6: Modified independent (Device/Increase time);With rail Left Sidelying to Sit: HOB flat;2: Max assist Sitting - Scoot to Edge of Bed: 5: Supervision Sit to Supine: 2: Max assist;HOB flat Transfers Lateral/Scoot Transfers: 4: Min assist;With slide board;With armrests removed Lateral/Scoot Transfer Details: Tactile cues for weight shifting;Verbal cues for technique;Manual facilitation for weight shifting Lateral/Scoot Transfer Details (indicate cue type and reason): [name] was able to preform lateral scoot with slide board with min assist once board was placed towards his right  side out of the [facility] and then from mat table back into his WC.   Locomotion : Corporate treasurer: Yes Wheelchair Assistance: 3: Armed forces technical officer Details: Tactile cues for weight shifting;Manual facilitation for weight shifting (manual and tactile assist to steer WC.  ) Wheelchair Propulsion: Right upper extremity;Right lower extremity Wheelchair Parts Management: Needs assistance Distance: 150      Balance: Dynamic Sitting Balance Dynamic Sitting - Balance Support: Left upper extremity supported;Right upper extremity supported;Bilateral upper extremity supported;No upper extremity supported;Feet supported;Feet unsupported Dynamic Sitting - Level of Assistance: 5: Stand by assistance Reach (Patient is able to reach ___ inches to right, left, forward, back): Pt reaching activity in all directions using his right hand (right , left, sideways, laterally, down to shoes bil) generally with supervision except reaching to left shoe min assist.   Dynamic Sitting - Comments: We practiced reaching, ball kicks with his left leg and no arm support, ball tosses with his right arm and left propped, and ball hits with his right hand and left arm propped all supervision and all in various planes making him reach and weight shift.  We did often have to reset and find midline as he would start to lean more posteriorly and right with fatigue.    See FIM for current functional status  Therapy/Group: Individual Therapy  Lurena Joiner B. Dale Strausser, PT, DPT [phone]   11/10/2011, 12:32 PM

## 2011-11-10 NOTE — Progress Notes (Signed)
Physical Therapy Note  Patient Details  Name: Jeremy [name] MRN: [id number] Date of Birth: 02/09/1966 Today's Date: 11/10/2011  Pt nauseated and felling ill this PM.  He reports that he is unable to work with PT.  RN aware and working on getting him some nausea meds.  60 mins of therapy time missed.      Rollene Rotunda Pleasant Britz, PT, DPT [phone]   11/10/2011, 2:36 PM

## 2011-11-11 ENCOUNTER — Inpatient Hospital Stay (HOSPITAL_COMMUNITY): Payer: BC Managed Care – PPO | Admitting: Speech Pathology

## 2011-11-11 ENCOUNTER — Inpatient Hospital Stay (HOSPITAL_COMMUNITY): Payer: BC Managed Care – PPO

## 2011-11-11 ENCOUNTER — Inpatient Hospital Stay (HOSPITAL_COMMUNITY): Payer: BC Managed Care – PPO | Admitting: *Deleted

## 2011-11-11 ENCOUNTER — Inpatient Hospital Stay (HOSPITAL_COMMUNITY): Payer: BC Managed Care – PPO | Admitting: Physical Therapy

## 2011-11-11 ENCOUNTER — Inpatient Hospital Stay (HOSPITAL_COMMUNITY): Payer: BC Managed Care – PPO | Admitting: Occupational Therapy

## 2011-11-11 MED ORDER — OXYCODONE HCL 5 MG PO TABS
10.0000 mg | ORAL_TABLET | Freq: Two times a day (BID) | ORAL | Status: DC
  Administered 2011-11-12 – 2011-11-16 (×7): 10 mg via ORAL
  Filled 2011-11-11 (×8): qty 2

## 2011-11-11 NOTE — Progress Notes (Signed)
Recreational Therapy Session Note  Patient Details  Name: Jeremy [name] MRN: [id number] Date of Birth: Jul 27, 1965 Today's Date: 11/11/2011 Time:  [id number] Pain: no c/o Skilled Therapeutic Interventions/Progress Updates: pt propelled w/c using RUE and RLE on tile and carpeted surfaces with close supervision- min assist.  Pt required min instructional cues to negotiate obstacles and for encouragement.  Pt required frequent rest breaks due to low activity tolerance.  Therapy/Group: Individual Therapy Carlina Derks 11/11/2011, 4:37 PM

## 2011-11-11 NOTE — Progress Notes (Signed)
Patient ID: Jeremy [name], male   DOB: August 10, 1965, 46 y.o.   MRN: [id number] Subjective/Complaints:left hip sore. No other complaints.   A 12 point review of systems has been performed and if not noted above is otherwise negative.   Objective: Vital Signs: Blood pressure 147/81, pulse 64, temperature 97.5 F (36.4 C), temperature source Oral, resp. rate 19, weight 117.1 kg (258 lb 2.5 oz), SpO2 95.00%. No results found. No results found for this basename: WBC:2,HGB:2,HCT:2,PLT:2 in the last 72 hours No results found for this basename: NA:2,K:2,CL:2,CO2:2,GLUCOSE:2,BUN:2,CREATININE:2,CALCIUM:2 in the last 72 hours CBG (last 3)  No results found for this basename: GLUCAP:3 in the last 72 hours  Wt Readings from Last 3 Encounters:  11/10/11 117.1 kg (258 lb 2.5 oz)  10/18/11 121.1 kg (266 lb 15.6 oz)  10/18/11 121.1 kg (266 lb 15.6 oz)    Physical Exam:  Nursing note and vitals reviewed.  Constitutional: He is oriented to person, place, and time. He appears well-developed and well-nourished.  Morbidly obese male  .   HENT:  Head: Normocephalic and atraumatic.   Marland Kitchen Poor dentition. Missing multiple teeth. Vocal quality much improved. Eyes: Pupils are equal, round, and reactive to light.  Unable to close left eye. Dysconjugate gaze, exopthalmus. Cannot gaze upward Neck:  trach Wound with some debris.   Cardiovascular: Normal rate and regular rhythm.  Pulmonary/Chest: Effort normal.   No distress. No cough. clear Abdominal: Soft. Bowel sounds are normal. He exhibits no distension. There is no tenderness.  PEG site clean and dry.  Musculoskeletal: He exhibits tenderness (left knee with ROM.). Overall decreased pain with ROM. Neurological: He is alert and oriented to person, place, and time. He displays abnormal reflex.   e. Polite and appropriate. Follows basic commands without difficulty.  . Left facial weakness. Speech quality and volume much better but not full phonation. Unable to  look upward. Dense left hemiparesis with flexor tone at knee is a little better.   clonus left foot 1-2beats. Senses pain in left leg and arm. LUE   trace. LLE 0-1 on MMT. Left arm and leg are easily moveable Skin: Skin is warm and dry. Trach site pink granulation tissue L Hip mild paion with ROM Assessment/Plan: 1. 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 Physiatrist is providing close team supervision and 24 hour management of active medical problems listed below. Physiatrist and rehab team continue to assess barriers to discharge/monitor patient progress toward functional and medical goals. FIM: FIM - Bathing Bathing Steps Patient Completed: Chest;Left Arm;Abdomen;Right upper leg;Left upper leg Bathing: 3: Mod-Patient completes 5-7 57f 10 parts or 50-74%  FIM - Upper Body Dressing/Undressing Upper body dressing/undressing steps patient completed: Thread/unthread left sleeve of pullover shirt/dress;Put head through opening of pull over shirt/dress;Pull shirt over trunk;Thread/unthread right sleeve of front closure shirt/dress Upper body dressing/undressing: 4: Steadying assist FIM - Lower Body Dressing/Undressing Lower body dressing/undressing steps patient completed: Thread/unthread right pants leg Lower body dressing/undressing: 1: Two helpers  FIM - Toileting Toileting steps completed by patient: Performs perineal hygiene Toileting: 0: Activity did not occur  FIM - Diplomatic Services operational officer Devices: Systems developer Transfers: 0-Activity did not occur  FIM - Banker Devices: Sliding board Bed/Chair Transfer: 1: Mechanical lift;2: Supine > Sit: Max A (lifting assist/Pt. 25-49%) (used [place]ley Dec Plus for pivot transfer)  FIM - Locomotion: Wheelchair  Distance:  150 Locomotion: Wheelchair: 2: Travels 50 - 149 ft with minimal assistance (Pt.>75%) FIM - Locomotion: Ambulation Locomotion: Ambulation: 0: Activity did not occur  Comprehension Comprehension Mode: Auditory Comprehension: 4-Understands basic 75 - 89% of the time/requires cueing 10 - 24% of the time  Expression Expression Mode: Verbal Expression Assistive Devices: 6-Talk trach valve Expression: 4-Expresses basic 75 - 89% of the time/requires cueing 10 - 24% of the time. Needs helper to occlude trach/needs to repeat words.  Social Interaction Social Interaction: 5-Interacts appropriately 90% of the time - Needs monitoring or encouragement for participation or interaction.  Problem Solving Problem Solving: 3-Solves basic 50 - 74% of the time/requires cueing 25 - 49% of the time  Memory Memory: 3-Recognizes or recalls 50 - 74% of the time/requires cueing 25 - 49% of the time  Medical Problem List and Plan:  1. DVT Prophylaxis/Anticoagulation: Pharmaceutical: Lovenox  2. Pain Management:    baclofen for spasticity and voltaren gel for left knee pain. , Hip pain still,  Xray-  - may be some neuro pain on left. Added neurontin-he tells me it helped and he seems to be tolerating it a little better 3. Mood: No signs of distress noted.  4. Neuropsych: This patient is is not capable of making decisions on his/her own behalf.  5. HTN: Monitor with bid checks. continue metoprolol bid. Trending down. No further changes yet 6. ABLA:  hgb trending up. 7. Tone:  baclofen trial has helped.--increased to qid.  PRAFO for LLW 8. Pulmonary:doing well s/p self-decannulation  -needs to keep dressing on but frequently pulls at it 9. FEN: eating fairly well  -PEG needs to stay in 4-6 weeks total LOS (Days) 20 A FACE TO FACE EVALUATION WAS PERFORMED  SWARTZ,ZACHARY T 11/11/2011, 6:55 AM

## 2011-11-11 NOTE — Progress Notes (Signed)
Occupational Therapy Session Note  Patient Details  Name: Jeremy [name] MRN: [id number] Date of Birth: May 18, 1965  Today's Date: 11/11/2011 Time: 0830-0930 Time Calculation (min): 60 min  OT Short Term Goals Week 3:  OT Short Term Goal 1 (Week 3): Patient will transition from sit  to stand with a grab bar and mod assist to aide with lower body dressing OT Short Term Goal 2 (Week 3): Patient will transfer to/ from  bedside commode  to right with min assist, to left with max assist OT Short Term Goal 3 (Week 3): Patient will demonstrate selective attention during meaningful functional activity (meals, hygiene) for 10 min in minimally distracting env't.  OT Short Term Goal 5 (Week 3): Patient will roll to left with verbal cueing to aide with bathing, positioning, hygiene   Skilled Therapeutic Interventions/Progress Updates:    self care retraining including bed mobility, dressing, sit to stand, slide board transfers, and grooming. Pt demonstrated good recall of methods for hemi dressing, slide board placement, safe placement of left UE with min questioning cuing. Supine to threading pants and rolling with min to mod A with recall of positioning left UE for pulling up pants. Sidelying to sitting EOB with max. Min cuing to setup shirt properly to don shirt. Min A slide board transfer with mod tactile cues for hand and feet placement into the w/c going to the right. Practiced sit to stand at the end of the bed using the bed frame as a grab bar- able to stand with min-mod A with +2 for safety. With facilitation at left hip and knee able to stand long enough to simulate pulling up pants. Performed grooming at sink demonstrating selective attention.  Therapy Documentation Precautions:  Precautions Precautions: Fall Precaution Comments: left hemiparesis Restrictions Weight Bearing Restrictions: No Pain: Pain 8-10 in left LE- RN aware and gave cream and meds. See FIM for current functional  status  Therapy/Group: Individual Therapy  Roney Mans [facility] 11/11/2011, 2:48 PM

## 2011-11-11 NOTE — Progress Notes (Signed)
Physical Therapy Note  Patient Details  Name: Rage Beever MRN: [id number] Date of Birth: 10/20/1965 Today's Date: 11/11/2011  Time: 1100-1142 42 minutes  Pt c/o L LE pain with all movement, relieved with rest.  Sliding board transfers to level surfaces with min A after board is placed, pt requires cuing for forward wt shift.  Seated wt shifts forward with focus on wt bearing through L LE, pt limited by pain in L LE with wt bearing, movement.  Supine <> sit training.  Pt able to sit>supine with min A to L side after scooting far back on mat, requiring assist only for lifting L LE.  Pt min A for bridging to scoot laterally on mat.  Supine >sit max A.  Seated and supine NMR for core activation and strengthening with tactile cuing for L abdominals.  Individual therapy   Braxxton Stoudt 11/11/2011, 11:41 AM

## 2011-11-11 NOTE — Progress Notes (Signed)
Patient alert and oriented to person, time, and place.  Patient called appropriately for assistance.  Timed toileting, use urinal with assistance.  Patient c/o pain to LLE with movement.  On schedule ultram and oxycodone for pain.

## 2011-11-11 NOTE — Progress Notes (Signed)
Physical Therapy Session Note  Patient Details  Name: Jeremy [name] MRN: [id number] Date of Birth: May 16, 1965  Today's Date: 11/11/2011 Time: 1400-1445 Time Calculation (min): 45 min  Short Term Goals: Week 2:  PT Short Term Goal 1 (Week 2): Patient will perform bed mobility with max A and max verbal cues for initiation and sequencing  PT Short Term Goal 2 (Week 2): Patient will transfer wheelchair to bed and mat with sliding board and max assistance of 1.  PT Short Term Goal 3 (Week 2): Patient will propell wheelchair 100 feet on level tile surfaces in controlled environment using right extremities with supervision.  PT Short Term Goal 4 (Week 2): Patient will stand with UE support and max assist for 2 minutes to assist with caregiver.  Skilled Therapeutic Interventions/Progress Updates:  Patient worked on standing in parallel bars. Patient sit to stand pulling on bar with minimal assist. Patient needs max assist for left hip and knee extension during stance. Patient able to maintain standing different lengths of time depending upon pain in left leg. Maximum standing time was 2.5 minutes. Attempted to stand and take steps with right LE however patient reports pain of "10" in left LE. Patient returned to room for pain meds. Patient transferred wheelchair to bed with min assist going to right. Patient sit to supine with mod assist. Patient able to reposition self in bed using bed rails.  Therapy Documentation Precautions:  Precautions Precautions: Fall Precaution Comments: left hemiparesis Restrictions Weight Bearing Restrictions: No  Pain: Pain Assessment Pain Assessment: 0-10 Pain Score: 10-Worst pain ever Pain Type: Acute pain Pain Location: Hip Pain Orientation: Left Pain Descriptors: Aching Pain Frequency: Constant Pain Onset: On-going Patients Stated Pain Goal: 2 Pain Intervention(s): Medication (See eMAR)  See FIM for current functional status  Therapy/Group: Individual  Therapy  [name] 11/11/2011, 4:33 PM

## 2011-11-11 NOTE — Progress Notes (Signed)
Speech Language Pathology Session & Weekly Progress Notes  Patient Details  Name: Jeremy [name] MRN: [id number] Date of Birth: October 05, 1965  Today's Date: 11/11/2011 Time: 7829-5621 Time Calculation (min): 30 min  Short Term Goals: Week 3: SLP Short Term Goal 1 (Week 3): Pt will demonstrate functional problem solving for functional and familiar tasks with Min A verbal and question cues.  SLP Short Term Goal 1 - Progress (Week 3): Met SLP Short Term Goal 2 (Week 3): Pt will demonstrate selective attention in a mildly distracting enviornment for 10 minutes with Mod A verbal cues for redirection.  SLP Short Term Goal 2 - Progress (Week 3): Met SLP Short Term Goal 3 (Week 3): Pt will attend to left enviornment/body with Min A verbal and question cues.  SLP Short Term Goal 3 - Progress (Week 3): Met SLP Short Term Goal 4 (Week 3): Pt will initiate functional tasks with Mod I.  SLP Short Term Goal 4 - Progress (Week 3): Met  New Short Term Goals:  Week 4: SLP Short Term Goal 1 (Week 4): Pt will utilize increased vocal intensity with supervision verbal and question cues to self-monitor and correct at the sentence level.  SLP Short Term Goal 2 (Week 4): Pt will demonstrate recall of new, daily information with utilization of visual aids with supervision question and visual cues.  SLP Short Term Goal 3 (Week 4): Pt will demonstrate functional problem solving with supervision verbal cues.  SLP Short Term Goal 4 (Week 4): Pt will attend to left body/enviornment with supervision verbal cues during functional tasks.   Weekly Progress Updates: Pt has made functional gains and has met 4 out of 4 STG's this reporting period. Currently, pt is overall Min A for functional problem solving with basic and familiar tasks, selective attention in a mildly distracting environment, emergent awareness and recall/carryover of functional information. Pt also requires Min verbal cues to utilize increased vocal intensity  at the sentence level. Pt would benefit from continued skilled SLP intervention to maximize cognitive function and overall independence.    SLP Frequency: 1-2 X/day, 30-60 minutes Estimated Length of Stay: TBD SLP Treatment/Interventions: Cognitive remediation/compensation;Cueing hierarchy;Environmental controls;Functional tasks;Internal/external aids;Therapeutic Activities;Patient/family education  Daily Session Skilled Therapeutic Intervention: Treatment focus on functional problem solving and working memory with kitchen task. Pt required overall Min A question cues for functional problem solving for making a simple meal in the kitchen and Mod I for recall of directions for preparation. Pt required Min verbal cues to utilize increased vocal intensity at the sentence level.  FIM:  Comprehension Comprehension Mode: Auditory Comprehension: 4-Understands basic 75 - 89% of the time/requires cueing 10 - 24% of the time Expression Expression Mode: Verbal Expression: 4-Expresses basic 75 - 89% of the time/requires cueing 10 - 24% of the time. Needs helper to occlude trach/needs to repeat words. Social Interaction Social Interaction: 5-Interacts appropriately 90% of the time - Needs monitoring or encouragement for participation or interaction. Problem Solving Problem Solving: 4-Solves basic 75 - 89% of the time/requires cueing 10 - 24% of the time Memory Memory: 4-Recognizes or recalls 75 - 89% of the time/requires cueing 10 - 24% of the time FIM - Eating Eating Activity: 5: Set-up assist for open containers Pain Pain Assessment Pain Assessment: 0-10 Pain Score:   3 Pain Type: Acute pain Pain Location: Ankle Pain Orientation: Left Pain Descriptors: Aching Pain Onset: With Activity Patients Stated Pain Goal: 2 Pain Intervention(s): Medication (See eMAR)  Therapy/Group: Individual Therapy  Bexley Mclester 11/11/2011, 12:15  PM

## 2011-11-12 ENCOUNTER — Inpatient Hospital Stay (HOSPITAL_COMMUNITY): Payer: BC Managed Care – PPO

## 2011-11-12 ENCOUNTER — Inpatient Hospital Stay (HOSPITAL_COMMUNITY): Payer: BC Managed Care – PPO | Admitting: Speech Pathology

## 2011-11-12 ENCOUNTER — Inpatient Hospital Stay (HOSPITAL_COMMUNITY): Payer: BC Managed Care – PPO | Admitting: Occupational Therapy

## 2011-11-12 DIAGNOSIS — Z5189 Encounter for other specified aftercare: Secondary | ICD-10-CM

## 2011-11-12 DIAGNOSIS — I619 Nontraumatic intracerebral hemorrhage, unspecified: Secondary | ICD-10-CM

## 2011-11-12 DIAGNOSIS — J9600 Acute respiratory failure, unspecified whether with hypoxia or hypercapnia: Secondary | ICD-10-CM

## 2011-11-12 DIAGNOSIS — I10 Essential (primary) hypertension: Secondary | ICD-10-CM

## 2011-11-12 MED ORDER — METOPROLOL TARTRATE 50 MG PO TABS
50.0000 mg | ORAL_TABLET | Freq: Two times a day (BID) | ORAL | Status: DC
  Administered 2011-11-12 – 2011-11-16 (×9): 50 mg via ORAL
  Filled 2011-11-12 (×14): qty 1

## 2011-11-12 NOTE — Progress Notes (Signed)
Patient sitting in wheelchair eating lunch and family reported patient vomiting . Patient noted to be projectile vomiting moderate amount undigested food. Patient reported no nausea prior to vomiting episode and refused protein supplement today . P. Love PA,  aware . No new orders . Continue with plan of care,.           Jeremy [name]

## 2011-11-12 NOTE — Progress Notes (Signed)
Occupational Therapy Weekly Progress Note  Patient Details  Name: Jeremy [name] MRN: [id number] Date of Birth: 04-09-1965  Today's Date: 11/12/2011 Time: 1105-1210 Time Calculation (min): 65 min  Patient has met 3 of 4 short term goals, due to improved postural control, improved activity tolerance, improved ability to weight shift forward safely, decreased fear of transitional movements.  Patient continues to demonstrate the following deficits: dense left hemiplegia, decreased selective attention, chronic knee pain, and morbid obesity and therefore will continue to benefit from skilled OT intervention to enhance overall performance with BADL.  Patient progressing toward long term goals..  Continue plan of care.  OT Short Term Goals Week 3:  OT Short Term Goal 1 (Week 3): Patient will transition from sit  to stand with a grab bar and mod assist to aide with lower body dressing OT Short Term Goal 1 - Progress (Week 3): Not met OT Short Term Goal 2 (Week 3): Patient will transfer to/ from  bedside commode  to right with min assist, to left with max assist OT Short Term Goal 2 - Progress (Week 3): Met OT Short Term Goal 3 (Week 3): Patient will demonstrate selective attention during meaningful functional activity (meals, hygiene) for 10 min in minimally distracting env't.  OT Short Term Goal 3 - Progress (Week 3): Met OT Short Term Goal 5 (Week 3): Patient will roll to left with verbal cueing to aide with bathing, positioning, hygiene OT Short Term Goal 5 - Progress (Week 3): Met Week 4:  OT Short Term Goal 1 (Week 4): Patient will pull to stand with mod assist and grab bar to assist with lower body dressing and undressing OT Short Term Goal 2 (Week 4): Patient will roll to right with min assist to aide with hygiene, and bed mobility OT Short Term Goal 3 (Week 4): Patient will don overhead shirt consistently with set up assistance and min verbal cueing OT Short Term Goal 4 (Week 4): Patient  will thread pants over legs with minimal assistance  Skilled Therapeutic Interventions/Progress Updates: Patient seen in OT this am to address his ability to logically  sequence steps of basic self care tasks, to motor plan various transitional movements and transfers with decreased physical and cognitive assistance, and to improve his ability to reach to lower legs, and weight shift to left in a controlled fashion.  Patient transferred to rolling shower chair for shower.  Patient able to direct steps of sliding board transfer with little cueing.       Therapy Documentation Precautions:  Precautions Precautions: Fall Precaution Comments: left hemiparesis Restrictions Weight Bearing Restrictions: No   Pain: Pain Assessment Pain Assessment: 0-10 Pain Score:   2 Pain Location: Leg Pain Orientation: Left   See FIM for current functional status  Therapy/Group: Individual Therapy  Collier Salina 11/12/2011, 3:02 PM

## 2011-11-12 NOTE — Progress Notes (Signed)
Speech Language Pathology Daily Session Note  Patient Details  Name: Jeremy [name] MRN: [id number] Date of Birth: Jun 05, 1965  Today's Date: 11/12/2011 Time: 1430-1500 Time Calculation (min): 30 min  Short Term Goals: Week 4: SLP Short Term Goal 1 (Week 4): Pt will utilize increased vocal intensity with supervision verbal and question cues to self-monitor and correct at the sentence level.  SLP Short Term Goal 2 (Week 4): Pt will demonstrate recall of new, daily information with utilization of visual aids with supervision question and visual cues.  SLP Short Term Goal 3 (Week 4): Pt will demonstrate functional problem solving with supervision verbal cues.  SLP Short Term Goal 4 (Week 4): Pt will attend to left body/enviornment with supervision verbal cues during functional tasks.   Skilled Therapeutic Interventions: Treatment focused on working memory, use of visual aids for orientation, and semantic cues for recall of daily events. Patient oriented to date, place and situation "a stroke" without cues, and  recalled and verbally responded to  questions regarding threrapy tasks completed today, with patient benefitting from moderate frequency of semantic cues. Patient also completed divergent category naming task, during which he  demonstrated awareness, approximately 60% of the time, to when he repeated a name that he had already used.   FIM:  Comprehension Comprehension Mode: Auditory Comprehension: 4-Understands basic 75 - 89% of the time/requires cueing 10 - 24% of the time Expression Expression Mode: Verbal Expression: 4-Expresses basic 75 - 89% of the time/requires cueing 10 - 24% of the time. Needs helper to occlude trach/needs to repeat words. Social Interaction Social Interaction: 5-Interacts appropriately 90% of the time - Needs monitoring or encouragement for participation or interaction. Problem Solving Problem Solving: 4-Solves basic 75 - 89% of the time/requires cueing 10 -  24% of the time Memory Memory: 4-Recognizes or recalls 75 - 89% of the time/requires cueing 10 - 24% of the time FIM - Eating Eating Activity: 5: Set-up assist for open containers  Pain Pain Assessment Pain Assessment: 0-10 Pain Score: 0-No pain Pain Intervention(s): Medication (See eMAR) (ultram 50 mg po)  Therapy/Group: Individual Therapy  Pablo Lawrence 11/12/2011, 4:59 PM  Angela Nevin, MA, CCC-SLP [facility] Speech-Language Pathologist

## 2011-11-12 NOTE — Progress Notes (Signed)
Physical Therapy Weekly Progress Note  Patient Details  Name: Jeremy [name] MRN: [id number] Date of Birth: 11-04-1965  Today's Date: 11/12/2011 Time: 1478-2956 Time Calculation (min): 45 min  Patient has met 2 of 4 short term goals. Patient met bed mobility and transfer goals. Patient is progressing towards wheelchair mobility and standing goal.   Patient continues to demonstrate the following deficits: dense hemiplegia, dependency in mobility and therefore will continue to benefit from skilled PT intervention to enhance overall performance with activity tolerance, balance and functional use of  left upper extremity and left lower extremity.  Patient progressing toward long term goals..that were revised earlier this week. Patient's dynamic standing and ambulation goals have been discontinued for this rehab admission. We will continue to work on progressing towards these goals however, patient will likely not be a functional ambulator at discharge.  PT Short Term Goals Week 2:  PT Short Term Goal 1 (Week 2): Patient will perform bed mobility with max A and max verbal cues for initiation and sequencing  PT Short Term Goal 1 - Progress (Week 2): Met PT Short Term Goal 2 (Week 2): Patient will transfer wheelchair to bed and mat with sliding board and max assistance of 1.  PT Short Term Goal 2 - Progress (Week 2): Met PT Short Term Goal 3 (Week 2): Patient will propell wheelchair 100 feet on level tile surfaces in controlled environment using right extremities with supervision.  PT Short Term Goal 3 - Progress (Week 2): Progressing toward goal PT Short Term Goal 4 (Week 2): Patient will stand with UE support and max assist for 2 minutes to assist with caregiver. PT Short Term Goal 4 - Progress (Week 2): Partly met Week 3:  PT Short Term Goal 1 (Week 3): Patient will perform bed mobility with rails and mod assist. PT Short Term Goal 2 (Week 3): Patient will transfer wheelchair to bed with sliding  board and minimal stead assist. PT Short Term Goal 3 (Week 3): Patient will propell wheelchair 100 feet on level tile surfaces in controlled environment using right extremities with supervision PT Short Term Goal 4 (Week 3): Patient will stand with UE support and mod assist for 2 minutes to assist with caregiver.  Skilled Therapeutic Interventions/Progress Updates:  Patient rolled side to side to help pull up pants with max assist to right and min assist to left using bed rails. Patient left side lying to sit with mod assist. Patient transferred bed to wheelchair to right using sliding board with moderate assist. Patient continues to need min assist with wheelchair mobility to avoid obstacles on left and occasionally to stay to right. Patient stood in hallway using railing. Patient is min assist for sit to stand and max assist is needed to maintain hip and knee extension in stance on left. Patient was able to take 5 steps forward with right LE. Patient was very flexed at trunk and hips and relying heavily on railing for support. Patient required max assist to progress left LE.  Therapy Documentation Precautions:  Precautions Precautions: Fall Precaution Comments: left hemiparesis Restrictions Weight Bearing Restrictions: No  Pain: Pain Assessment Pain Assessment: 0-10 Pain Score:   2 Pain Location: Leg Pain Orientation: Left  Locomotion : Wheelchair Mobility Distance: 120 feet   See FIM for current functional status  Therapy/Group: Individual Therapy  [name] 11/12/2011, 12:29 PM

## 2011-11-12 NOTE — Progress Notes (Signed)
Nutrition Follow-up  Intervention:  Continue Ensure Complete PO BID. Discontinue Prostat as it makes pt sick.  Assessment:   D/c plan changed to SNF. Pt is consuming 25 - 60% of meals. Pt refusing Prostat as pt states it makes him sick. Continues on Regular diet. Pt states that he would like to continue the Ensure Complete, encouraged pt to continue this to help maximize nutritional intake.  Last BM: 9/26  Diet Order:  Regular  Supplement: Ensure Complete BID and Prostat BID  Meds: Scheduled Meds:    . antiseptic oral rinse  15 mL Mouth Rinse TID WC & HS  . baclofen  10 mg Oral QID  . diclofenac sodium  2 g Topical QID  . enoxaparin  40 mg Subcutaneous Q24H  . feeding supplement  237 mL Oral BID BM  . feeding supplement  30 mL Oral BID  . gabapentin  100 mg Oral TID  . lisinopril  5 mg Oral BID  . metoprolol  50 mg Oral Q12H  . naphazoline-glycerin  2 drop Left Eye QID  . oxyCODONE  10 mg Oral BID  . senna-docusate  1 tablet Oral BID  . traMADol  50 mg Oral QID  . DISCONTD: metoprolol tartrate  50 mg Oral Q12H   Continuous Infusions:  PRN Meds:.acetaminophen, alum & mag hydroxide-simeth, baclofen, bisacodyl, cloNIDine, diphenhydrAMINE, guaiFENesin-dextromethorphan, oxyCODONE, prochlorperazine, prochlorperazine, prochlorperazine, sodium chloride, traZODone  Labs:  CMP     Component Value Date/Time   NA 136 10/25/2011 1115   K 3.8 10/25/2011 1115   CL 100 10/25/2011 1115   CO2 28 10/25/2011 1115   GLUCOSE 102* 10/25/2011 1115   BUN 17 10/25/2011 1115   CREATININE 0.84 10/25/2011 1115   CALCIUM 9.9 10/25/2011 1115   PROT 7.8 10/25/2011 1115   ALBUMIN 2.9* 10/25/2011 1115   AST 50* 10/25/2011 1115   ALT 77* 10/25/2011 1115   ALKPHOS 102 10/25/2011 1115   BILITOT 0.4 10/25/2011 1115   GFRNONAA >90 10/25/2011 1115   GFRAA >90 10/25/2011 1115   CBG (last 3)  No results found for this basename: GLUCAP:3 in the last 72 hours    Intake/Output Summary (Last 24 hours) at 11/12/11 1245 Last data filed  at 11/12/11 0909  Gross per 24 hour  Intake    360 ml  Output    725 ml  Net   -365 ml  BM 9/21  Weight:  122.4 kg (9/21) - wt trending up  Re-estimated needs, unchanged:  2200-2400 kcals, 115-130 grams protein, 2.2-2.4 liters fluid daily  Nutrition Dx:  Inadequate oral intake, resolved.  Goal:  Pt to meet >/= 90% of their estimated nutrition needs, met.  Monitor:  PO intake, weight trend, labs  Jarold Motto MS, RD, LDN Pager: [phone] After-hours pager: [phone]

## 2011-11-12 NOTE — Progress Notes (Signed)
Patient ID: Jeremy [name], male   DOB: [birth year], 46 y.o.   MRN: [id number] Subjective/Complaints:no new complaints. Pain under reasonable control.   A 12 point review of systems has been performed and if not noted above is otherwise negative.   Objective: Vital Signs: Blood pressure 143/89, pulse 73, temperature 97.9 F (36.6 C), temperature source Oral, resp. rate 17, weight 121.3 kg (267 lb 6.7 oz), SpO2 97.00%. No results found. No results found for this basename: WBC:2,HGB:2,HCT:2,PLT:2 in the last 72 hours No results found for this basename: NA:2,K:2,CL:2,CO2:2,GLUCOSE:2,BUN:2,CREATININE:2,CALCIUM:2 in the last 72 hours CBG (last 3)  No results found for this basename: GLUCAP:3 in the last 72 hours  Wt Readings from Last 3 Encounters:  11/12/11 121.3 kg (267 lb 6.7 oz)  10/18/11 121.1 kg (266 lb 15.6 oz)  10/18/11 121.1 kg (266 lb 15.6 oz)    Physical Exam:  Nursing note and vitals reviewed.  Constitutional: He is oriented to person, place, and time. He appears well-developed and well-nourished.  Morbidly obese male  .   HENT:  Head: Normocephalic and atraumatic.   Marland Kitchen Poor dentition. Missing multiple teeth. Vocal quality much improved. Eyes: Pupils are equal, round, and reactive to light.  Unable to close left eye. Dysconjugate gaze, exopthalmus. Cannot gaze upward Neck:  trach Wound with some debris.   Cardiovascular: Normal rate and regular rhythm.  Pulmonary/Chest: Effort normal.   No distress. No cough. clear Abdominal: Soft. Bowel sounds are normal. He exhibits no distension. There is no tenderness.  PEG site clean and dry.  Musculoskeletal: He exhibits tenderness (left knee with ROM.). Overall decreased pain with ROM. Neurological: He is alert and oriented to person, place, and time. He displays abnormal reflex.      Follows basic commands without difficulty.  . Left facial weakness. Speech quality and volume much better but not full phonation. Unable to look  upward. Dense left hemiparesis with flexor tone at knee is a little better.   clonus left foot 1-2beats. Senses pain in left leg and arm. LUE   trace. LLE 0-1 on MMT. Left arm and leg are easily moveable Skin: Skin is warm and dry. Trach site pink granulation tissue L Hip mild paion with ROM Assessment/Plan: 1. 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 Physiatrist is providing close team supervision and 24 hour management of active medical problems listed below. Physiatrist and rehab team continue to assess barriers to discharge/monitor patient progress toward functional and medical goals. FIM: FIM - Bathing Bathing Steps Patient Completed: Chest;Left Arm;Abdomen;Right upper leg;Left upper leg Bathing: 3: Mod-Patient completes 5-7 32f 10 parts or 50-74%  FIM - Upper Body Dressing/Undressing Upper body dressing/undressing steps patient completed: Thread/unthread right sleeve of pullover shirt/dresss;Thread/unthread left sleeve of pullover shirt/dress;Put head through opening of pull over shirt/dress;Pull shirt over trunk Upper body dressing/undressing: 5: Supervision: Safety issues/verbal cues FIM - Lower Body Dressing/Undressing Lower body dressing/undressing steps patient completed: Thread/unthread right pants leg Lower body dressing/undressing: 1: Total-Patient completed less than 25% of tasks  FIM - Toileting Toileting steps completed by patient: Performs perineal hygiene Toileting: 0: Activity did not occur  FIM - Diplomatic Services operational officer Devices: Systems developer Transfers: 0-Activity did not occur  FIM - Banker Devices: Sliding board Bed/Chair Transfer: 3: Supine > Sit: Mod A (lifting assist/Pt. 50-74%/lift 2 legs;3: Sit > Supine: Mod A (  lifting  assist/Pt. 50-74%/lift 2 legs);3: Bed > Chair or W/C: Mod A (lift or lower assist);3: Chair or W/C > Bed: Mod A (lift or lower assist)  FIM - Locomotion: Wheelchair Distance: 150 Locomotion: Wheelchair: 2: Travels 50 - 149 ft with minimal assistance (Pt.>75%) FIM - Locomotion: Ambulation Locomotion: Ambulation: 0: Activity did not occur  Comprehension Comprehension Mode: Auditory Comprehension: 4-Understands basic 75 - 89% of the time/requires cueing 10 - 24% of the time  Expression Expression Mode: Verbal Expression Assistive Devices: 6-Talk trach valve Expression: 4-Expresses basic 75 - 89% of the time/requires cueing 10 - 24% of the time. Needs helper to occlude trach/needs to repeat words.  Social Interaction Social Interaction: 5-Interacts appropriately 90% of the time - Needs monitoring or encouragement for participation or interaction.  Problem Solving Problem Solving: 4-Solves basic 75 - 89% of the time/requires cueing 10 - 24% of the time  Memory Memory: 4-Recognizes or recalls 75 - 89% of the time/requires cueing 10 - 24% of the time  Medical Problem List and Plan:  1. DVT Prophylaxis/Anticoagulation: Pharmaceutical: Lovenox  2. Pain Management:    baclofen for spasticity and voltaren gel for left knee pain. , Hip pain still,  Xray-  - ice /rom  -neurontin for neuro pain 3. Mood: No signs of distress noted.  4. Neuropsych: This patient is is not capable of making decisions on his/her own behalf.  5. HTN: Monitor with bid checks. continue metoprolol bid. Trending down. No further changes yet 6. ABLA:  hgb trending up. 7. Tone:  baclofen trial has helped.--increased to qid.  PRAFO for LLW 9. FEN: eating fairly well  -PEG needs to stay in 4-6 weeks total LOS (Days) 21 A FACE TO FACE EVALUATION WAS PERFORMED  Jeremy [name] 11/12/2011, 6:54 AM

## 2011-11-12 NOTE — Progress Notes (Signed)
Social Work Patient ID: Jeremy [name], male   DOB: [birth year], 46 y.o.   MRN: [id number] Spoke with Cheryl-[employer] Case manager to inform no bed offers as of yet.  [facility] is out and searching for bed. She reports to contact her Monday with information.  Will have Lucy contact on Monday with update.

## 2011-11-12 NOTE — Progress Notes (Signed)
Physical Therapy Note  Patient Details  Name: Jeremy [name] MRN: [id number] Date of Birth: [birth year] Today's Date: 11/12/2011  Patient missed 60 minutes PT session in PM. Patient experienced projectile vomiting after lunch and asked that he be able to rest.  Arelia Longest M 11/12/2011, 2:21 PM

## 2011-11-13 ENCOUNTER — Inpatient Hospital Stay (HOSPITAL_COMMUNITY): Payer: BC Managed Care – PPO | Admitting: Speech Pathology

## 2011-11-13 ENCOUNTER — Inpatient Hospital Stay (HOSPITAL_COMMUNITY): Payer: BC Managed Care – PPO | Admitting: Physical Therapy

## 2011-11-13 NOTE — Progress Notes (Addendum)
Patient ID: Jeremy [name], male   DOB: November 29, 1965, 46 y.o.   MRN: [id number] Subjective/Complaints: Denies complaints- no pain No other complaints  Objective: Vital Signs: Blood pressure 139/88, pulse 65, temperature 97.9 F (36.6 C), temperature source Oral, resp. rate 18, weight 267 lb 6.7 oz (121.3 kg), SpO2 97.00%.  nad Chest- cta CV- reg rate abd- thin, soft, surgical wound covered, ostomy site without signs of infxn  Assessment/Plan: 1. 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 Physiatrist is providing close team supervision and 24 hour management of active medical problems listed below. Physiatrist and rehab team continue to assess barriers to discharge/monitor patient progress toward functional and medical goals. FIM:  Medical Problem List and Plan:  1. DVT Prophylaxis/Anticoagulation: Pharmaceutical: Lovenox  2. Pain Management:    baclofen for spasticity and voltaren gel for left knee pain. , -neurontin for neuro pain He denies pain today 3. Mood: No signs of distress noted.  4. Neuropsych: This patient is is not capable of making decisions on his/her own behalf.  5. HTN: Monitor with bid checks. continue metoprolol bid. Trending down. No further changes yet BP Readings from Last 3 Encounters:  11/13/11 139/88  10/22/11 135/78  10/22/11 135/78    6. ABLA:  hgb trending up. Lab Results  Component Value Date   HGB 10.2* 10/25/2011    7. Tone:  baclofen trial has helped.--increased to qid.  PRAFO for LLW 9. FEN:   -PEG needs to stay in 4-6 weeks total LOS (Days) 22 A FACE TO FACE EVALUATION WAS PERFORMED  Reality Dejonge HENRY 11/13/2011, 9:23 AM

## 2011-11-13 NOTE — Progress Notes (Signed)
Physical Therapy Note  Patient Details  Name: Jeremy [name] MRN: [id number] Date of Birth: 1965/08/24 Today's Date: 11/13/2011  1100-1125 (25 minutes) individual Pain : LT LE 5/10 /premedicated Focus of treatment: transfer training/ wc mobility training/ standing tolerance Treatment: pt in bed; donning shorts in supine min assist with roll to left /max assist with roll to right; transfers - setup (sliding board) bed > wc min assist; wc mobility - 120 feet min assist using hemi technique (right); ; sit to stand to raised mat X 2 max assist + 2 for safety 1 minute with  Max tactile cues to increase trunk/hip  Extension.   Marcile Fuquay,JIM 11/13/2011, 8:11 AM

## 2011-11-14 ENCOUNTER — Inpatient Hospital Stay (HOSPITAL_COMMUNITY): Payer: BC Managed Care – PPO

## 2011-11-14 LAB — URINALYSIS, ROUTINE W REFLEX MICROSCOPIC
Bilirubin Urine: NEGATIVE
Glucose, UA: NEGATIVE mg/dL
Hgb urine dipstick: NEGATIVE
Ketones, ur: NEGATIVE mg/dL
Leukocytes, UA: NEGATIVE
Nitrite: NEGATIVE
Protein, ur: NEGATIVE mg/dL
Specific Gravity, Urine: 1.023 (ref 1.005–1.030)
Urobilinogen, UA: 1 mg/dL (ref 0.0–1.0)
pH: 7 (ref 5.0–8.0)

## 2011-11-14 MED ORDER — LISINOPRIL 10 MG PO TABS
10.0000 mg | ORAL_TABLET | Freq: Two times a day (BID) | ORAL | Status: DC
  Administered 2011-11-14 – 2011-11-16 (×4): 10 mg via ORAL
  Filled 2011-11-14 (×6): qty 1

## 2011-11-14 NOTE — Progress Notes (Signed)
Patient ID: Alban Marucci, male   DOB: 1965/04/26, 46 y.o.   MRN: [id number] Subjective/Complaints: Denies complaints-except nurse reports urinary discomfort No other complaints Objective: Vital Signs: Blood pressure 148/93, pulse 60, temperature 98.3 F (36.8 C), temperature source Oral, resp. rate 20, weight 267 lb 6.7 oz (121.3 kg), SpO2 98.00%.  NAD Chest- cta CV- reg rate abd- thin, soft, surgical wound covered, ostomy site without signs of infxn  Assessment/Plan: 1. 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 Physiatrist is providing close team supervision and 24 hour management of active medical problems listed below. Physiatrist and rehab team continue to assess barriers to discharge/monitor patient progress toward functional and medical goals. FIM:  Medical Problem List and Plan:  1. DVT Prophylaxis/Anticoagulation: Pharmaceutical: Lovenox  2. Pain Management:    baclofen for spasticity and voltaren gel for left knee pain. , -neurontin for neuro pain He denies pain today 3. Mood: No signs of distress noted.  4. Neuropsych: This patient is is not capable of making decisions on his/her own behalf.  5. HTN: Monitor with bid checks. continue metoprolol bid. Will increase lisinopril BP Readings from Last 3 Encounters:  11/14/11 148/93  10/22/11 135/78  10/22/11 135/78    6. ABLA:  hgb trending up. Lab Results  Component Value Date   HGB 10.2* 10/25/2011    7. Tone:  baclofen trial has helped.--increased to qid.  PRAFO for LLW 9. FEN:   -PEG needs to stay in 4-6 weeks total-- pt understands LOS (Days) 23 A FACE TO FACE EVALUATION WAS PERFORMED  SWORDS,BRUCE HENRY 11/14/2011, 9:25 AM

## 2011-11-14 NOTE — Plan of Care (Signed)
Problem: RH BOWEL ELIMINATION Goal: RH STG MANAGE BOWEL WITH ASSISTANCE STG Manage Bowel with Max Assistance.  Outcome: Not Progressing Pt wears brief and continues to have incontinent episodes.   Problem: RH BLADDER ELIMINATION Goal: RH STG MANAGE BLADDER WITH ASSISTANCE STG Manage Bladder With Max Assistance  Outcome: Progressing Pt wears condom cath @ hs but is doing well with timed toileting during the day

## 2011-11-14 NOTE — Progress Notes (Signed)
Occupational Therapy Session Note  Patient Details  Name: Jeremy [name] MRN: [id number] Date of Birth: 04/22/1965  Today's Date: 11/14/2011 Time: 0930-1030 Time Calculation (min): 60 min  Short Term Goals: Week 3:  OT Short Term Goal 1 (Week 3): Patient will transition from sit  to stand with a grab bar and mod assist to aide with lower body dressing OT Short Term Goal 1 - Progress (Week 3): Not met OT Short Term Goal 2 (Week 3): Patient will transfer to/ from  bedside commode  to right with min assist, to left with max assist OT Short Term Goal 2 - Progress (Week 3): Met OT Short Term Goal 3 (Week 3): Patient will demonstrate selective attention during meaningful functional activity (meals, hygiene) for 10 min in minimally distracting env't.  OT Short Term Goal 3 - Progress (Week 3): Met OT Short Term Goal 5 (Week 3): Patient will roll to left with verbal cueing to aide with bathing, positioning, hygiene OT Short Term Goal 5 - Progress (Week 3): Met  Skilled Therapeutic Interventions/Progress Updates: ADL-retraining at walk-in shower using wheeled bariatric shower chair with emphasis on sliding board transfers, weight-shifting, midline crossing, management/attention to left, problem-solving, and sequencing.   Patient was able to verbalize efficient sequence for assisted bathing and dressing but over-estimated his performance during transfers and static standing several times.   Patient requires cues for safety and attention to left during assisted transfers with need for assist with managing left leg and blocking his body from sliding as he progressed through transfer due to his impaired body position awareness.     Therapy Documentation Precautions:  Precautions Precautions: Fall Precaution Comments: left hemiparesis Restrictions Weight Bearing Restrictions: No  Pain: No report of pain   See FIM for current functional status  Therapy/Group: Individual Therapy  Georgeanne Nim 11/14/2011, 12:37 PM

## 2011-11-15 ENCOUNTER — Inpatient Hospital Stay (HOSPITAL_COMMUNITY): Payer: BC Managed Care – PPO

## 2011-11-15 ENCOUNTER — Encounter (HOSPITAL_COMMUNITY): Payer: BC Managed Care – PPO | Admitting: Occupational Therapy

## 2011-11-15 LAB — URINE CULTURE: Colony Count: 6000

## 2011-11-15 MED ORDER — GABAPENTIN 100 MG PO CAPS
200.0000 mg | ORAL_CAPSULE | Freq: Three times a day (TID) | ORAL | Status: DC
  Administered 2011-11-15 – 2011-11-16 (×5): 200 mg via ORAL
  Filled 2011-11-15 (×7): qty 2

## 2011-11-15 NOTE — Discharge Summary (Signed)
NAMEGEREMY, RISTER NO.:  [id number]  MEDICAL RECORD NO.:  [id number]  LOCATION:  4001                         FACILITY:  [facility]  PHYSICIAN:  Ranelle Oyster, M.D.DATE OF BIRTH:  [birth year]  DATE OF ADMISSION:  10/22/2011 DATE OF DISCHARGE:                              DISCHARGE SUMMARY   DISCHARGE DIAGNOSES: 1. Right basal ganglia, thalamic infarct with dense left hemiparesis. 2. Hypertension. 3. Acute blood loss anemia. 4. Mild elevation in LFTs. 5. Leukocytosis resolved.  HISTORY OF PRESENT ILLNESS:  [name] is a 46 year old male with history of hypertension not treated times years.  He was admitted on October 01, 2011, with headache nausea, vomiting, and difficulty using left hand.  CT of head done revealed right basal ganglia hemorrhage with surrounding edema.  He was started on nicardipine drip for blood pressure control.  He did develop somnolence due to extension of hemorrhage to right thalamus and posterior third ventricle with concerns for developing obstructive hydrocephalus.  Intraventricular cath was placed by Dr. Jordan Likes.  He did require replacement of catheter due to malfunction on August 21,2013.  He had difficulty weaning of the vent and was trached on October 11, 2011, and gastrostomy tube was placed by Interventional Radiology on October 13, 2011.  The patient has had an episode of projectile vomiting.  On October 20, 2011, KUB done showed no evidence of obstruction.  Rate was adjusted with resolution of symptoms.  His trach has been downsized to cuffless Shiley 6 done and regular diet initiated.  The patient continued to be impaired due to dense left hemiparesis and  sensory deficits, left neglect aphonia with cognitive deficits, as well as visual deficits in upper feet.  The therapy team has recommended CIR for progression.  PAST MEDICAL HISTORY:  Positive for untreated hypertension.  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  Does not use any tobacco.  Does not use any alcohol or illicit drugs.  FUNCTIONAL HISTORY:  The patient was independent and working full-time prior to admission functional status.  The patient is +2 total assist 20% for bed mobility.  He required maxi move for transfers.  He was able to perform anterior weight shifting exercises with maximal verbal and tactile cues.  He required min-to-mod assist to maintain balance.  He was noted to have impaired memory and decreased awareness of deficits.  HOSPITAL COURSE:  [name] was admitted to Rehab on October 22, 2011, for inpatient therapies to consist of PT, OT, and speech therapy at least 3 hours 5 days a week.  Past admission, physiatrist, rehab, RN, and therapy team have worked together to provide customized collaborative interdisciplinary care.  Exam at admission revealed dense left hemiparesis at flexor tone, severe dysarthria and aphonia.  He was able to follow basic commands without difficulty.  The patient has been tolerating regular diet without difficulty.  His trach was downsized and the patient was decannulated without difficulty.  Prior stoma has healed well.  PEG tube remains in place and area around is clean, dry, and intact.  The patient's p.o. intake has been good.  He has had intermittent episode  of vomiting.  Barium swallow was done for workup on November 15, 2011, showing poor primary esophageal contractions.  No evidence of narrowing, filling defects, and no episodes of gastroesophageal reflux.  The patient has had complaints of left hip pain.  X-rays done showed no acute fracture or subluxation.  The patient was started on baclofen for spasticity.  Neurontin was added additionally for neuropathic symptoms.  He continues to be limited by pain, left hip and this is being treated with Ultram as well as scheduled OxyIR to help participation in therapy.  Check of  lytes as admission revealed sodium 136, potassium 3.8, chloride 100, CO2 28 BUN 17, creatinine 0.84, glucose 102.  A check of LFTs showed mild elevation with AST at 50, ALT at 77, total bili 0.4, alkaline phos 106.  Check of CBC showed acute blood loss anemia to be stable with hemoglobin at 10.2, hematocrit 32.0, white count 5.6, platelets 410.  During the patient's stay in rehab, weekly team conferences were held to monitor the patient's progress, set goals, as well as discuss barriers to discharge.  Physical therapy has been working the patient on mobility, strengthening and balance.  The patient is able to transition from side lying to sit with mod assist.  He is able to transfer from bed to wheelchair to the right using sliding board with mod assist.  He continues to require min assist for wheelchair mobility to avoid obstacles on left and occasionally to stay at right.  He is able to stand in hallway using a railing min assist for sit to stand, max assist needed to maintain hip and knee extension in stance on left.  He was able to take 5 steps forward with right lower extremity with max assist required to progress lower extremity.  He continues to be limited by dense hemiplegia as well as balance deficits.  He is showing improvement and ability to logically sequence for basic self-care tasks and to motor plan transitional movement.  Speech therapy has worked with the patient on memory with utilization of visual aids for orientation and semantic cues for recall of daily events.  He is showing improvement in awareness.  Due to significant assistance required, family has elected on progressive therapies at SNF.  Bed is available at [facility] for November 16, 2011, and the patient is to be discharged to this facility.  Progressive PT, OT, speech therapy to continue past discharge.  DISCHARGE MEDICATIONS: 1. Baclofen 10 mg p.o. q.i.d. 2. Veltin gel to left knee q.i.d. 3.  Neurontin 200 mg p.o. t.i.d. 4. Lisinopril 10 mg p.o. b.i.d. 5. Metoprolol 50 mg p.o. b.i.d. 6. Moisturizing eye drop, 2 drops left eye q.i.d. 7. OxyIR 10 mg p.o. b.i.d. prior to a.m., p.m. therapies. 8. Senokot-S 1 p.o. b.i.d. 9. Ultram 50 mg p.o. q.i.d. 10.Compazine 10 mg p.o. q.i.d. p.r.n. nausea. 11.Catapres 0.1 mg p.o. q.i.d. p.r.n. systolic blood pressure greater     than 170. 12. Reglan 5 mg p.o. Ac/hs.  DIET:  Regular with intermittent supervision and tray setup.  SPECIAL INSTRUCTIONS:  Progressive PT, OT, speech therapy to continue past discharge.  Routine PICC care.  FOLLOWUP:  The patient to follow up with Dr. Riley Kill in 4 weeks.  Follow up with Dr. Pearlean Brownie in 4 weeks.  Follow up with Dr. Jordan Likes Neurosurgery in 2- 3 weeks.     Delle Reining, P.A.   ______________________________ Ranelle Oyster, M.D.    PL/MEDQ  D:  11/15/2011  T:  11/15/2011  Job:  [id number]  cc:   Pramod P. Pearlean Brownie, MD

## 2011-11-15 NOTE — Progress Notes (Signed)
Patient reported nausea again and compazine 5mg  po at 1316 after refusing after vomiting episode . Patient reluctant to take meds this pm ultram and oxycodone at lunch due to nausea . Continue with plan of care .            Jeremy [name]

## 2011-11-15 NOTE — Progress Notes (Signed)
Patient ID: Jeremy [name], male   DOB: [birth year], 46 y.o.   MRN: [id number] Subjective/Complaints:no new complaints. Pain under reasonable control.   A 12 point review of systems has been performed and if not noted above is otherwise negative.   Objective: Vital Signs: Blood pressure 138/89, pulse 69, temperature 98 F (36.7 C), temperature source Oral, resp. rate 18, weight 121.3 kg (267 lb 6.7 oz), SpO2 98.00%. No results found. No results found for this basename: WBC:2,HGB:2,HCT:2,PLT:2 in the last 72 hours No results found for this basename: NA:2,K:2,CL:2,CO2:2,GLUCOSE:2,BUN:2,CREATININE:2,CALCIUM:2 in the last 72 hours CBG (last 3)  No results found for this basename: GLUCAP:3 in the last 72 hours  Wt Readings from Last 3 Encounters:  11/12/11 121.3 kg (267 lb 6.7 oz)  10/18/11 121.1 kg (266 lb 15.6 oz)  10/18/11 121.1 kg (266 lb 15.6 oz)    Physical Exam:  Nursing note and vitals reviewed.  Constitutional: He is oriented to person, place, and time. He appears well-developed and well-nourished.  Morbidly obese male  .   HENT:  Head: Normocephalic and atraumatic.   Marland Kitchen Poor dentition. Missing multiple teeth. Vocal quality much improved. Eyes: Pupils are equal, round, and reactive to light.  Unable to close left eye. Dysconjugate gaze, exopthalmus. Cannot gaze upward Neck:  trach Wound with some debris.   Cardiovascular: Normal rate and regular rhythm.  Pulmonary/Chest: Effort normal.   No distress. No cough. clear Abdominal: Soft. Bowel sounds are normal. He exhibits no distension. There is no tenderness.  PEG site clean and dry.  Musculoskeletal: He exhibits tenderness (left knee with ROM.). Overall decreased pain with ROM. Neurological: He is alert and oriented to person, place, and time. He displays abnormal reflex.      Follows basic commands without difficulty.  . Left facial weakness. Speech quality and volume much better but not full phonation. Unable to look upward.  Dense left hemiparesis with flexor tone at knee is a little better.   clonus left foot 1-2beats. Senses pain in left leg and arm. LUE   trace. LLE 0-1 on MMT. Left arm and leg are easily moveable Skin: Skin is warm and dry. Trach site pink granulation tissue L Hip mild paion with ROM Assessment/Plan: 1. 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 Physiatrist is providing close team supervision and 24 hour management of active medical problems listed below. Physiatrist and rehab team continue to assess barriers to discharge/monitor patient progress toward functional and medical goals. FIM: FIM - Bathing Bathing Steps Patient Completed: Chest;Left Arm;Abdomen;Front perineal area;Right upper leg;Left upper leg Bathing: 3: Mod-Patient completes 5-7 36f 10 parts or 50-74%  FIM - Upper Body Dressing/Undressing Upper body dressing/undressing steps patient completed: Thread/unthread right sleeve of pullover shirt/dresss Upper body dressing/undressing: 2: Max-Patient completed 25-49% of tasks FIM - Lower Body Dressing/Undressing Lower body dressing/undressing steps patient completed: Thread/unthread right pants leg Lower body dressing/undressing: 0: Wears gown/pajamas-no public clothing  FIM - Toileting Toileting steps completed by patient: Performs perineal hygiene Toileting: 6: More than reasonable amount of time  FIM - Diplomatic Services operational officer Devices: Systems developer Transfers: 0-Activity did not occur  FIM - Banker Devices: Sliding board;Bed rails Bed/Chair Transfer: 3: Supine > Sit: Mod A (lifting assist/Pt. 50-74%/lift 2 legs;3: Sit > Supine: Mod A (lifting assist/Pt. 50-74%/lift 2 legs);3: Bed > Chair or W/C: Mod A (  lift or lower assist);3: Chair or W/C > Bed:  Mod A (lift or lower assist)  FIM - Locomotion: Wheelchair Distance: 120 feet Locomotion: Wheelchair: 2: Travels 50 - 149 ft with minimal assistance (Pt.>75%) FIM - Locomotion: Ambulation Locomotion: Ambulation: 0: Activity did not occur  Comprehension Comprehension Mode: Auditory Comprehension: 5-Understands basic 90% of the time/requires cueing < 10% of the time  Expression Expression Mode: Verbal Expression Assistive Devices: 6-Talk trach valve Expression: 4-Expresses basic 75 - 89% of the time/requires cueing 10 - 24% of the time. Needs helper to occlude trach/needs to repeat words.  Social Interaction Social Interaction: 5-Interacts appropriately 90% of the time - Needs monitoring or encouragement for participation or interaction.  Problem Solving Problem Solving: 4-Solves basic 75 - 89% of the time/requires cueing 10 - 24% of the time  Memory Memory: 5-Recognizes or recalls 90% of the time/requires cueing < 10% of the time  Medical Problem List and Plan:  1. DVT Prophylaxis/Anticoagulation: Pharmaceutical: Lovenox  2. Pain Management:    baclofen for spasticity and voltaren gel for left knee pain. , Hip pain still,  Xray-  - ice/rom  -neurontin for neuro pain--will titrate further to 200mg  3. Mood: No signs of distress noted.  4. Neuropsych: This patient is is not capable of making decisions on his/her own behalf.  5. HTN: Monitor with bid checks. continue metoprolol bid. Trending down. No further changes yet 6. ABLA:  hgb trending up. 7. Tone:  baclofen trial has helped.--increased to qid.  PRAFO for LLW 9. FEN: eating fairly well  -PEG needs to stay about 4-6 weeks total LOS (Days) 24 A FACE TO FACE EVALUATION WAS PERFORMED  Jeremy [name] T 11/15/2011, 7:04 AM

## 2011-11-15 NOTE — Progress Notes (Signed)
Social Work Patient ID: Jeremy [name], male   DOB: [birth year], 46 y.o.   MRN: [id number]  Have received a SNF bed offer from [facility] - pt and wife have accepted and plan to transfer pt tomorrow via ambulance. Have also notified insurance CM.  Alberto Schoch

## 2011-11-15 NOTE — Progress Notes (Signed)
Discharge summary [phone]

## 2011-11-15 NOTE — Progress Notes (Signed)
SLP Cancellation Note  Treatment cancelled today due to medical issues with patient which prohibited therapy. Patient with projectile vomitting. Missed 30 minutes of skilled SLP treatment.   Ferdinand Lango MA, CCC-SLP [phone]   Shirlene Andaya Meryl 11/15/2011, 12:10 PM

## 2011-11-15 NOTE — Progress Notes (Signed)
Physical Therapy Session Note  Patient Details  Name: Jeremy [name] MRN: [id number] Date of Birth: 1965-04-12  Today's Date: 11/15/2011 Time: 1017-1100 Time Calculation (min): 43 min  Short Term Goals: Week 3:  PT Short Term Goal 1 (Week 3): Patient will perform bed mobility with rails and mod assist. PT Short Term Goal 2 (Week 3): Patient will transfer wheelchair to bed with sliding board and minimal stead assist. PT Short Term Goal 3 (Week 3): Patient will propell wheelchair 100 feet on level tile surfaces in controlled environment using right extremities with supervision PT Short Term Goal 4 (Week 3): Patient will stand with UE support and mod assist for 2 minutes to assist with caregiver.  Skilled Therapeutic Interventions/Progress Updates:  Patient transferred wheelchair to mat using sliding board going to right with moderate assistance up hill. Patient reported his pants were slick and he kept sliding back down the board. Patient sit to supine with moderate assistance with left LE. Therapy consisted of neuromuscular reeducation to facilitate left hip flexion/extension and knee flexion/extension in suspension grid. Patient right side lying to sit with mod assist for left LE. Patient transferred back to wheelchair with mod assist and sliding board going to left.   Therapy Documentation Precautions:  Precautions Precautions: Fall Precaution Comments: left hemiparesis Restrictions Weight Bearing Restrictions: No  Pain: Pain Assessment Pain Assessment: 0-10 Pain Score:   2 Pain Location: Hip Pain Orientation: Left 2nd Pain Site Pain Score: 0 3rd Pain Site Pain Score: 0  See FIM for current functional status  Therapy/Group: Individual Therapy  Arelia Longest M 11/15/2011, 12:09 PM

## 2011-11-15 NOTE — Progress Notes (Signed)
Physical Therapy Note  Patient Details  Name: Jeremy [name] MRN: [id number] Date of Birth: Jun 28, 1965 Today's Date: 11/15/2011  Time In:  08:30 Time out: 0931.  Individual session, no pain.  ADL retraining at shower level with focus on bed mobility, bed to roll in shower seat transfers with sliding board, sitting balance, hemi techniques, sit to stand x2, standing balance, increasing activity tolerance, shower seat to wheelchair transfers, wheelchair positioning.  Patient very motivated this am.   Norton Pastel 11/15/2011, 10:29 AM

## 2011-11-15 NOTE — Progress Notes (Signed)
Physical Therapy Discharge Summary  Patient Details  Name: Jeremy [name] MRN: [id number] Date of Birth: 16-Dec-1965  Today's Date: 11/15/2011 Time: 1300-1400 Time Calculation (min): 60 min  Patient has met 4 of 6 long term goals due to improved activity tolerance, improved balance, improved postural control, ability to compensate for deficits, improved attention and improved awareness.  Patient to discharge at a wheelchair level Mod Assist.   Patient's care partner is unable to  provide the necessary physical assistance at discharge. Therefore patient is being discharged to short term skilled nursing facility to receive continued therapy  Reasons goals not met: Patient progressed very well. Patient's LTG's for ambulation and dynamic standing balance were discontinued due to patients inability to maintain weight bearing on left hip secondary to pain. Patient met all goals except supervision for wheelchair mobility. Patient continues to require cueing and occasional min assist to avoid objects on left.  Recommendation:  Patient will benefit from ongoing skilled PT services in skilled nursing facility setting to continue to advance safe functional mobility, address ongoing impairments in mobility and minimize fall risk.  Equipment: No equipment provided  Reasons for discharge: discharge from hospital  Patient/family agrees with progress made and goals achieved: Yes  PT Discharge Precautions/Restrictions Fall precautions  Pain Pain Assessment Pain Assessment: 0-10 Pain Score:   2 Pain Location: Hip Pain Orientation: Left  Cognition Overall Cognitive Status: Appears within functional limits for tasks assessed Orientation Level: Oriented X4 Attention: Selective Focused Attention: Appears intact Sustained Attention: Appears intact Selective Attention: Appears intact Memory: Appears intact Sensation Sensation Light Touch: Impaired by gross assessment (left extremities) Motor    Motor Motor: Hemiplegia;Abnormal tone Motor - Skilled Clinical Observations: Left dense hemiplegia; 2- hip flexion and knee extension on left  Mobility Bed Mobility Bed Mobility: Rolling Right;Rolling Left;Right Sidelying to Sit;Left Sidelying to Sit;Sit to Supine Rolling Right: 6: Modified independent (Device/Increase time) (with bed rail) Rolling Left: 3: Mod assist Rolling Left: Patient Percentage: 70% Rolling Left Details:  (assist with left extremities) Right Sidelying to Sit: 3: Mod assist (assist to push up and with left LE) Right Sidelying to Sit Details (indicate cue type and reason): assist to push up and with left LE Left Sidelying to Sit: 3: Mod assist Sit to Supine: 3: Mod assist Sit to Supine: Patient Percentage: 70% Sit to Supine - Details (indicate cue type and reason): assist with left extremities Transfers Lateral/Scoot Transfers: With slide board;3: Mod assist Lateral/Scoot Transfer Details: Manual facilitation for weight shifting Locomotion  Ambulation Ambulation: No Ambulation/Gait Assistance: Patient sit to stand with min assist when pulling on bar/rail. Patient requires maximum assist to maintain hip and knee extension on left during stance. Patient is able to tolerate standing about 2 minutes. Patient has attempted taking about 3 to 5 steps with right foot with max assist. Stairs / Additional Locomotion Stairs: No Wheelchair Mobility Distance: 150 feet    Balance Static Sitting Balance Static Sitting - Balance Support: No upper extremity supported Static Sitting - Level of Assistance: 6: Modified independent (Device/Increase time) Static Sitting - Comment/# of Minutes: Patient able to maintain static sitting independently. Dynamic Sitting Balance Dynamic Sitting - Balance Support: No upper extremity supported Dynamic Sitting - Level of Assistance: 5: Stand by assistance  See FIM for current functional status  [name] 11/15/2011, 5:33  PM

## 2011-11-16 DIAGNOSIS — I619 Nontraumatic intracerebral hemorrhage, unspecified: Secondary | ICD-10-CM

## 2011-11-16 DIAGNOSIS — Z5189 Encounter for other specified aftercare: Secondary | ICD-10-CM

## 2011-11-16 DIAGNOSIS — J9600 Acute respiratory failure, unspecified whether with hypoxia or hypercapnia: Secondary | ICD-10-CM

## 2011-11-16 DIAGNOSIS — I10 Essential (primary) hypertension: Secondary | ICD-10-CM

## 2011-11-16 MED ORDER — METOCLOPRAMIDE HCL 5 MG PO TABS
5.0000 mg | ORAL_TABLET | Freq: Three times a day (TID) | ORAL | Status: DC
  Administered 2011-11-16 (×2): 5 mg via ORAL
  Filled 2011-11-16 (×4): qty 1

## 2011-11-16 NOTE — Progress Notes (Signed)
1625 Pt. Discharged from rehab to SNF at [facility].  Report given to [facility] via phone.  No further questions asked.  All personal belongings in tow.  Transport provided via [employer].

## 2011-11-16 NOTE — Progress Notes (Signed)
Speech Language Pathology Discharge Summary  Patient Details  Name: Yehuda Okubo MRN: [id number] Date of Birth: 1965/08/09  Today's Date: 11/16/2011 Time:  - 1350    Patient has met 6 of 6 long term goals.  Patient to discharge at [facility] level.  Reasons goals not met:   n/a  Clinical Impression/Discharge Summary: Patient has made functional gains in the areas of communication and cognition during inpatient rehab admission, meeting 6/6 long term goals set. Currently, patient is overall min assist for selective attention, emergent awareness, and problem solving, supervision for increased vocal intensity for improved speech intelligibility and modified indepdent for orientation and comprehension. Recommend f/u 24 hour supervision after d/c wtih HH vs OP SLP f/u for continued cognitive linguistic treatment.   Care Partner:  Caregiver Able to Provide Assistance: Yes  Type of Caregiver Assistance: Physical;Cognitive  Recommendation:  24 hour supervision/assistance  Rationale for SLP Follow Up: Maximize cognitive function and independence;Maximize functional communication   Equipment:   none  Reasons for discharge: Discharged from hospital;Treatment goals met   Patient/Family Agrees with Progress Made and Goals Achieved: Yes   See FIM for current functional status  Monea Pesantez Meryl 11/16/2011, 1:50 PM

## 2011-11-16 NOTE — Plan of Care (Signed)
Problem: RH Wheelchair Mobility Goal: LTG Patient will propel w/c in home environment (PT) LTG: Patient will propel wheelchair in home environment, # of feet with assistance (PT).  Outcome: Not Met (add Reason) Patient continues to need cueing and occasional minimal assist to avoid objects on left.  Comments:  Patient continues to require cueing and occasional minimal assistance to avoid objects on left.

## 2011-11-16 NOTE — Progress Notes (Signed)
Occupational Therapy Discharge Summary  Patient Details  Name: Jeremy [name] MRN: [id number] Date of Birth: Mar 31, 1965  Today's Date: 11/16/2011     Patient has met 5 of 9 long term goals due to improved activity tolerance, improved balance, postural control, ability to compensate for deficits, functional use of  LEFT lower extremity, improved attention and improved awareness.  Patient to discharge at overall Total Assist level.  Patient's care partner requires assistance to provide the necessary physical assistance at discharge.    Reasons goals not met: consistent pain in LE's limited mobility, dense left hemiplegia  Recommendation:  Patient will benefit from ongoing skilled OT services in skilled nursing facility setting to continue to advance functional skills in the area of BADL and Reduce care partner burden.  Equipment: No equipment provided  Reasons for discharge: discharge from hospital  Patient/family agrees with progress made and goals achieved: Yes  OT Discharge Pain Pain Assessment Pain Score: 0-No pain Faces Pain Scale: No hurt    Cognition Arousal/Alertness: Awake/alert Orientation Level: Oriented X4 Attention: Selective Selective Attention: Impaired Selective Attention Impairment: Functional basic (min A) Memory: Impaired Memory Impairment: Decreased recall of new information;Decreased short term memory Decreased Short Term Memory: Functional basic Awareness: Impaired Awareness Impairment: Emergent impairment (min A) Problem Solving: Impaired Problem Solving Impairment: Functional basic Sensation Hemisensory loss UE > LE  Motor See Navigator   Mobility  See Navigator    Trunk/Postural Assessment  See Navigator    Balance See Navigator   Extremity/Trunk Assessment See Navigator    See FIM for current functional status  Collier Salina 11/16/2011, 4:13 PM

## 2011-11-16 NOTE — Progress Notes (Signed)
Social Work  Discharge Note  The overall goal for the admission was met for:   Discharge location: No - plan changed to SNF due to LOC required  Length of Stay: No - extended due to SNF bed search  Discharge activity level: Yes - has met revised goals of mod - max assist overall  Home/community participation: Yes  Services provided included: MD, RD, PT, OT, SLP, RN, TR, Pharmacy, Neuropsych and SW  Financial Services: Private Insurance: BCBS  Follow-up services arranged: Other: SNF at [facility]  Comments (or additional information):  Patient/Family verbalized understanding of follow-up arrangements: Yes  Individual responsible for coordination of the follow-up plan: wife  Confirmed correct DME delivered: NA  Jeremy [name]

## 2011-11-16 NOTE — Progress Notes (Signed)
Recreational Therapy Discharge Summary Patient Details  Name: Jeremy [name] MRN: [id number] Date of Birth: 1965-05-30 Today's Date: 11/16/2011  Long term goals set: 1  Long term goals met: 0  Comments on progress toward goals: Pt is making slow steady progress toward goal.  Pt is being discharged today to SNF for continued therapies and 24 hour care.  Pt completes simple TR tasks seated w/c level with Min assist and min cues.  Pt continues to be limited by decreased balance, left sided weakness, decreased attention, and decreased vision.  Pt remains motivated and participatory in therapies. Reasons for discharge: discharge from hospital  Follow-up: encourage participation in activities program  Patient/family agrees with progress made and goals achieved: Yes  Stefanos Haynesworth 11/16/2011, 8:14 AM

## 2011-11-16 NOTE — Progress Notes (Signed)
Patient ID: Jeremy [name], male   DOB: 07/01/1965, 46 y.o.   MRN: [id number] Subjective/Complaints:no new complaints. Had vomiting again yesterday after a meal, thus the barium swallow was performed   A 12 point review of systems has been performed and if not noted above is otherwise negative.   Objective: Vital Signs: Blood pressure 146/98, pulse 82, temperature 98.3 F (36.8 C), temperature source Oral, resp. rate 20, weight 121.3 kg (267 lb 6.7 oz), SpO2 98.00%. Dg Esophagus  11/15/2011  *RADIOLOGY REPORT*  Clinical Data: 46 year old male with vomiting.  ESOPHOGRAM/BARIUM SWALLOW  Technique:  Single contrast examination was performed using thin barium.  Fluoroscopy time:  0.7 minutes.  Comparison:  None  Findings:  This study was limited secondary to the patient's physical condition and inability to stand or move on the table.  Poor primary esophageal contractions noted. There is no evidence of fixed narrowing, fixed filling defects or mucosal abnormalities within the esophagus. No episodes of gastroesophageal reflux were noted during the study.  IMPRESSION: Unremarkable exam.   Original Report Authenticated By: Rosendo Gros, M.D.    No results found for this basename: WBC:2,HGB:2,HCT:2,PLT:2 in the last 72 hours No results found for this basename: NA:2,K:2,CL:2,CO2:2,GLUCOSE:2,BUN:2,CREATININE:2,CALCIUM:2 in the last 72 hours CBG (last 3)  No results found for this basename: GLUCAP:3 in the last 72 hours  Wt Readings from Last 3 Encounters:  11/12/11 121.3 kg (267 lb 6.7 oz)  10/18/11 121.1 kg (266 lb 15.6 oz)  10/18/11 121.1 kg (266 lb 15.6 oz)    Physical Exam:  Nursing note and vitals reviewed.  Constitutional: He is oriented to person, place, and time. He appears well-developed and well-nourished.  Morbidly obese male  .   HENT:  Head: Normocephalic and atraumatic.   Marland Kitchen Poor dentition. Missing multiple teeth. Vocal quality much improved. Eyes: Pupils are equal, round, and  reactive to light.  Unable to close left eye. Dysconjugate gaze, exopthalmus. Cannot gaze upward Neck:  trach Wound with some debris.   Cardiovascular: Normal rate and regular rhythm.  Pulmonary/Chest: Effort normal.   No distress. No cough. clear Abdominal: Soft. Bowel sounds are normal. He exhibits no distension. There is no tenderness.  PEG site clean and dry.  Musculoskeletal: He exhibits tenderness (left knee with ROM.). Overall decreased pain with ROM. Neurological: He is alert and oriented to person, place, and time. He displays abnormal reflex.      Follows basic commands without difficulty.  . Left facial weakness. Speech quality and volume much better but not full phonation. Unable to look upward. Dense left hemiparesis with flexor tone at knee is a little better.   clonus left foot 1-2beats. Senses pain in left leg and arm. LUE   trace. LLE 0-1 on MMT. Left arm and leg are easily moveable Skin: Skin is warm and dry. Trach site pink granulation tissue L Hip mild paion with ROM Assessment/Plan: 1. 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 Physiatrist is providing close team supervision and 24 hour management of active medical problems listed below. Physiatrist and rehab team continue to assess barriers to discharge/monitor patient progress toward functional and medical goals.  TO SNF TODAY   FIM: FIM - Bathing Bathing Steps Patient Completed: Chest;Abdomen;Front perineal area;Right upper leg;Left upper leg Bathing: 3: Mod-Patient completes 5-7 41f 10 parts or 50-74% (bariatric roll in shower seat in shower)  FIM -  Upper Body Dressing/Undressing Upper body dressing/undressing steps patient completed: Thread/unthread right sleeve of pullover shirt/dresss;Put head through opening of pull over shirt/dress Upper body  dressing/undressing: 3: Mod-Patient completed 50-74% of tasks FIM - Lower Body Dressing/Undressing Lower body dressing/undressing steps patient completed:  (total assist.  Mod a to stand with grab bar with vc) Lower body dressing/undressing: 1: Two helpers  FIM - Toileting Toileting steps completed by patient: Performs perineal hygiene (required assist to clean after bowel mvmt) Toileting: 0: Activity did not occur  FIM - Diplomatic Services operational officer Devices: Systems developer Transfers: 0-Activity did not occur  FIM - Banker Devices: Sliding board;Bed rails Bed/Chair Transfer: 3: Supine > Sit: Mod A (lifting assist/Pt. 50-74%/lift 2 legs;3: Sit > Supine: Mod A (lifting assist/Pt. 50-74%/lift 2 legs);3: Bed > Chair or W/C: Mod A (lift or lower assist);3: Chair or W/C > Bed: Mod A (lift or lower assist)  FIM - Locomotion: Wheelchair Distance: 150 feet Locomotion: Wheelchair: 2: Travels 50 - 149 ft with minimal assistance (Pt.>75%) FIM - Locomotion: Ambulation Ambulation/Gait Assistance:  (Patient sit to stand with min assist when pulling on bar.) Locomotion: Ambulation: 0: Activity did not occur  Comprehension Comprehension Mode: Auditory Comprehension: 5-Follows basic conversation/direction: With extra time/assistive device  Expression Expression Mode: Verbal Expression Assistive Devices: 6-Talk trach valve Expression: 5-Expresses basic needs/ideas: With extra time/assistive device  Social Interaction Social Interaction: 6-Interacts appropriately with others with medication or extra time (anti-anxiety, antidepressant).  Problem Solving Problem Solving: 4-Solves basic 75 - 89% of the time/requires cueing 10 - 24% of the time  Memory Memory: 5-Recognizes or recalls 90% of the time/requires cueing < 10% of the time  Medical Problem List and Plan:  1. DVT Prophylaxis/Anticoagulation: Pharmaceutical: Lovenox  2. Pain  Management:    baclofen for spasticity and voltaren gel for left knee pain. , Hip pain still,  Xray-  - ice/rom  -neurontin for neuro pain--will titrate further to 200mg  3. Mood: No signs of distress noted.  4. Neuropsych: This patient is is not capable of making decisions on his/her own behalf.  5. HTN: Monitor with bid checks. continue metoprolol bid. Trending down. No further changes yet, but probably will need adjustment as an outpt 6. ABLA:  hgb trending up. 7. Tone:  baclofen trial has helped.--increased to qid.  PRAFO for LLW 9. FEN: eating fairly well  -PEG needs to stay about 4-6 weeks total (only in week 4 currently)  -Barium swallow unremarkable, no stricture   -will start him on low dose reglan with meals to see if this helps LOS (Days) 25 A FACE TO FACE EVALUATION WAS PERFORMED  SWARTZ,ZACHARY T 11/16/2011, 6:49 AM

## 2011-12-14 ENCOUNTER — Encounter: Payer: Self-pay | Admitting: Physical Medicine & Rehabilitation

## 2011-12-14 ENCOUNTER — Encounter
Payer: BC Managed Care – PPO | Attending: Physical Medicine & Rehabilitation | Admitting: Physical Medicine & Rehabilitation

## 2011-12-14 VITALS — BP 177/105 | HR 75 | Resp 14 | Ht 70.0 in | Wt 250.0 lb

## 2011-12-14 DIAGNOSIS — M76899 Other specified enthesopathies of unspecified lower limb, excluding foot: Secondary | ICD-10-CM

## 2011-12-14 DIAGNOSIS — I69959 Hemiplegia and hemiparesis following unspecified cerebrovascular disease affecting unspecified side: Secondary | ICD-10-CM | POA: Insufficient documentation

## 2011-12-14 DIAGNOSIS — R1310 Dysphagia, unspecified: Secondary | ICD-10-CM

## 2011-12-14 DIAGNOSIS — Z931 Gastrostomy status: Secondary | ICD-10-CM | POA: Insufficient documentation

## 2011-12-14 DIAGNOSIS — I619 Nontraumatic intracerebral hemorrhage, unspecified: Secondary | ICD-10-CM

## 2011-12-14 DIAGNOSIS — M7060 Trochanteric bursitis, unspecified hip: Secondary | ICD-10-CM | POA: Insufficient documentation

## 2011-12-14 DIAGNOSIS — I10 Essential (primary) hypertension: Secondary | ICD-10-CM

## 2011-12-14 DIAGNOSIS — G8114 Spastic hemiplegia affecting left nondominant side: Secondary | ICD-10-CM | POA: Insufficient documentation

## 2011-12-14 DIAGNOSIS — G8110 Spastic hemiplegia affecting unspecified side: Secondary | ICD-10-CM

## 2011-12-14 NOTE — Patient Instructions (Signed)
ASSESSMENT: 1. Right BG/thalamic hemorrhage- he is making gradual progress 2. HTN 3. Dysphagia (resolved) still has G-tube. 4. Spastic left hemiparesis. 5. Mild left greater trochanter bursitis  Plan: 1. Continue with PT, OT, and SLP addressing ROM, strength, fxnl mobility, speech/language, cognition, etc. He may be a candidate for lower extremity bracing to assist with mobility, but I'm not sure he's there yet 2. Would wean off reglan over the next 10 days. May decrease to 5mg  BID for one week then 5mg  QDAY for one week then off 3. He may return to this clinic in about 2 weeks for G-tube removal. HE NEEDS TO BE NPO THE NIGHT BEFORE THE VISIT FOR TUBE REMOVAL.  He had eaten this morning---therefore, I couldn't remove tube today. 4. Maintain baclofen for spasticity management for now 5. Blood pressure control per facility. He may need further adjustment of his regimen based on our reading at office today (177/105) 6. Recommend ice and basic stretching for left hip. May ice the hip up to three x's daily and have PT address hip flexion, abuction, adduction, and rotational exercises 7. I will see him  Back for G Tube removal on __________________

## 2011-12-14 NOTE — Progress Notes (Signed)
Subjective:    Patient ID: Jeremy [name], male    DOB: 04-08-1965, 46 y.o.   MRN: [id number]  HPI  Jeremy [name] is here in follow up of his right BG/thalamic hemorhage. He remains over at [facility] where they are working on strength and basic mobility exercises.  His pain is improved. He still complains of discomfort in the left hip, especially after he exercises or when the "cold air hits it". He is eating well. He no longer needs the g-tube.  His sleep is normal. Spasticity is under reasonable control  He has had nor further problems with vomiting but does report that when he eats that his food "goes right through him" by the next day. Sometimes his stool is loose.   He wants to know when his g-tube will come out.     Pain Inventory Average Pain 5 Pain Right Now 0 My pain is intermittent, tingling and aching  In the last 24 hours, has pain interfered with the following? General activity 10 Relation with others 10 Enjoyment of life 10 What TIME of day is your pain at its worst? morning Sleep (in general) Good  Pain is worse with: walking, bending, sitting, standing and some activites Pain improves with: rest, pacing activities and medication Relief from Meds: 5  Mobility walk with assistance how many minutes can you walk? 0 ability to climb steps?  no do you drive?  no use a wheelchair needs help with transfers Do you have any goals in this area?  yes  Function employed # of hrs/week [employer] 40 hrs  Neuro/Psych weakness numbness tingling trouble walking confusion  Prior Studies x-rays CT/MRI  Physicians involved in your care Any changes since last visit?  no   History reviewed. No pertinent family history. History   Social History  . Marital Status: Married    Spouse Name: N/A    Number of Children: N/A  . Years of Education: N/A   Social History Main Topics  . Smoking status: Never Smoker   . Smokeless tobacco: None  .  Alcohol Use: No  . Drug Use: No  . Sexually Active:    Other Topics Concern  . None   Social History Narrative  . None   Past Surgical History  Procedure Date  . Tracheostomy tube placement 10/11/2011    Procedure: TRACHEOSTOMY;  Surgeon: Christia Reading, MD;  Location: [facility] OR;  Service: ENT;  Laterality: N/A;   Past Medical History  Diagnosis Date  . Hypertension 10/01/2011    basal ganglia  . Stroke    BP 177/105  Pulse 75  Resp 14  Ht 5\' 10"  (1.778 m)  Wt 250 lb (113.399 kg)  BMI 35.87 kg/m2  SpO2 95%     Review of Systems  Musculoskeletal: Positive for myalgias, arthralgias and gait problem.  Neurological: Positive for weakness and numbness.  All other systems reviewed and are negative.       Objective:   Physical Exam   General: Alert and oriented x 3, No apparent distress, overweight HEENT: Head is normocephalic, atraumatic, PERRLA, EOMI, exopthalmos, sclera anicteric, oral mucosa pink and moist, dentition poor, ext ear canals clear,  Neck: Supple without JVD or lymphadenopathy Heart: Reg rate and rhythm. No murmurs rubs or gallops Chest: CTA bilaterally without wheezes, rales, or rhonchi; no distress Abdomen: Soft, non-tender, non-distended, bowel sounds positive. Extremities: No clubbing, cyanosis, or edema. Pulses are 2+ Skin: Clean and intact without signs of breakdown Neuro: Pt is  cognitively appropriate with normal insight, memory, and awareness.speech volume improved, minimal dysarthria.  Cranial nerves 2-12 are notable for better tracking and conjugate gaze, mild left facial weakness.. Occasional nystagmus was seen with lateral gze.. Sensory exam is 1/2 on the left and he can sense pain. Reflexes are 3+ on the left. Minimal resting tone. He does have 2 beats of clonus on the left.. 5/5 motor on the right. LUE is 0-1/5. LLE is 1-2-/5. Marland Kitchen  Musculoskeletal: Full ROM, mild pain with palpation over left greater trochanter. Minimal pain with ER, IR, and flexion  of the the left hip. Psych: Pt's affect is appropriate. Pt is cooperative        Assessment & Plan:  ASSESSMENT: 1. Right BG/thalamic hemorrhage- he is making gradual progress 2. HTN 3. Dysphagia (resolved) still has G-tube. 4. Spastic left hemiparesis. 5. Mild left greater trochanter bursitis  Plan: 1. Continue with PT, OT, and SLP addressing ROM, strength, fxnl mobility, speech/language, cognition, etc. He may be a candidate for lower extremity bracing to assist with mobility, but I'm not sure he's there yet 2. Would wean off reglan over the next 10 days. May decrease to 5mg  BID for one week then 5mg  QDAY for one week then off 3. He may return to this clinic in about 2 weeks for G-tube removal. HE NEEDS TO BE NPO THE NIGHT BEFORE THE VISIT FOR TUBE REMOVAL.  He had eaten this morning---therefore, I couldn't remove tube today. 4. Maintain baclofen for spasticity management for now 5. Blood pressure control per facility. He may need further adjustment of his regimen based on our reading at office today (177/105) 6. Hip exercises, ice 6. I will see him  Back for G Tube removal on __________________

## 2012-01-05 ENCOUNTER — Encounter: Payer: Self-pay | Admitting: Physical Medicine & Rehabilitation

## 2012-01-05 ENCOUNTER — Encounter
Payer: BC Managed Care – PPO | Attending: Physical Medicine & Rehabilitation | Admitting: Physical Medicine & Rehabilitation

## 2012-01-05 VITALS — BP 177/107 | HR 74 | Resp 14 | Ht 70.0 in | Wt 252.0 lb

## 2012-01-05 DIAGNOSIS — I10 Essential (primary) hypertension: Secondary | ICD-10-CM | POA: Insufficient documentation

## 2012-01-05 DIAGNOSIS — M7060 Trochanteric bursitis, unspecified hip: Secondary | ICD-10-CM

## 2012-01-05 DIAGNOSIS — G8110 Spastic hemiplegia affecting unspecified side: Secondary | ICD-10-CM

## 2012-01-05 DIAGNOSIS — I69959 Hemiplegia and hemiparesis following unspecified cerebrovascular disease affecting unspecified side: Secondary | ICD-10-CM | POA: Insufficient documentation

## 2012-01-05 DIAGNOSIS — M76899 Other specified enthesopathies of unspecified lower limb, excluding foot: Secondary | ICD-10-CM

## 2012-01-05 DIAGNOSIS — R1310 Dysphagia, unspecified: Secondary | ICD-10-CM

## 2012-01-05 DIAGNOSIS — Z931 Gastrostomy status: Secondary | ICD-10-CM | POA: Insufficient documentation

## 2012-01-05 NOTE — Patient Instructions (Signed)
Can resume PO diet today with liquids only at lunch and then full, regular diet at dinner time.  Can take all medicines by mouth today.

## 2012-01-05 NOTE — Progress Notes (Signed)
Subjective:    Patient ID: Jeremy [name], male    DOB: 1965-04-26, 46 y.o.   MRN: [id number]  HPI  Jeremy [name] is here in follow up for removal of his G-Tube.  He is eating well and meeting caloric needs. No problems with dysphagia are reported.  He has not eaten today.  Pain Inventory Average Pain none Pain Right Now none My pain is none  In the last 24 hours, has pain interfered with the following? General activity 10 Relation with others 10 Enjoyment of life 10 What TIME of day is your pain at its worst? morning Sleep (in general) Fair  Pain is worse with: some activites Pain improves with: pacing activities Relief from Meds: none  Mobility use a wheelchair needs help with transfers Do you have any goals in this area?  yes  Function employed # of hrs/week [employer] 40hrs a week I need assistance with the following:  dressing, bathing, toileting, meal prep, household duties and shopping Do you have any goals in this area?  yes  Neuro/Psych weakness numbness trouble walking Confusion, anxiety  Prior Studies Any changes since last visit?  no  Physicians involved in your care Any changes since last visit?  no   Family History  Problem Relation Age of Onset  . Hypertension Mother   . Heart disease Father   . Gout Father   . Hypertension Father    History   Social History  . Marital Status: Married    Spouse Name: N/A    Number of Children: N/A  . Years of Education: N/A   Social History Main Topics  . Smoking status: Never Smoker   . Smokeless tobacco: None  . Alcohol Use: No  . Drug Use: No  . Sexually Active:    Other Topics Concern  . None   Social History Narrative  . None   Past Surgical History  Procedure Date  . Tracheostomy tube placement 10/11/2011    Procedure: TRACHEOSTOMY;  Surgeon: Christia Reading, MD;  Location: [facility] OR;  Service: ENT;  Laterality: N/A;   Past Medical History  Diagnosis Date  .  Hypertension 10/01/2011    basal ganglia  . Stroke    BP 177/107  Pulse 74  Resp 14  Ht 5\' 10"  (1.778 m)  Wt 252 lb (114.306 kg)  BMI 36.16 kg/m2  SpO2 97%     Review of Systems  Musculoskeletal: Positive for gait problem.  All other systems reviewed and are negative.       Objective:   Physical Exam  General: Alert and oriented x 3, No apparent distress, overweight  HEENT: Head is normocephalic, atraumatic, PERRLA, EOMI, exopthalmos, sclera anicteric, oral mucosa pink and moist, dentition poor, ext ear canals clear,  Neck: Supple without JVD or lymphadenopathy  Heart: Reg rate and rhythm. No murmurs rubs or gallops  Chest: CTA bilaterally without wheezes, rales, or rhonchi; no distress  Abdomen: Soft, non-tender, non-distended, bowel sounds positive. G-tube is intact without drainage. Area around tube is granulated. Extremities: No clubbing, cyanosis, or edema. Pulses are 2+  Skin: Clean and intact without signs of breakdown  Neuro: Pt is cognitively appropriate with normal insight, memory, and awareness.speech volume improved, minimal dysarthria. Cranial nerves 2-12 are notable for better tracking and conjugate gaze, mild left facial weakness.. Occasional nystagmus was seen with lateral gze.. Sensory exam is 1/2 on the left and he can sense pain. Reflexes are 3+ on the left. Minimal resting tone. He does  have 2 beats of clonus on the left.. 5/5 motor on the right. LUE is 0-1/5. LLE is 1-2-/5. Marland Kitchen  Musculoskeletal: Full ROM, mild pain with palpation over left greater trochanter. Minimal pain with ER, IR, and flexion of the the left hip.  Psych: Pt's affect is appropriate. Pt is cooperative   Assessment & Plan:   ASSESSMENT: 1. Right BG/thalamic hemorrhage- he is making gradual progress  2. HTN  3. Dysphagia (resolved) with G-tube.  4. Spastic left hemiparesis.  5. Mild left greater trochanter bursitis  Plan:  1. Continue with PT, OT, and SLP addressing ROM, strength, fxnl  mobility, speech/language, cognition, etc. He may be a candidate for lower extremity bracing to assist with mobility, but I'm not sure he's there yet  2. G Tube was removed today with traction without incident and dressing was reapplied.  There was bleeding noted with removal as expected. A pressure dressing was applied. Instructions for future wound care were provided for the SNF. Can resume diet with liquids only at lunch and then full diet at dinner time. 4. Maintain baclofen for spasticity management for now  5. Blood pressure control per facility.   6. Hip exercises, ice  7. Follow up here in about 3 months.

## 2012-01-21 ENCOUNTER — Ambulatory Visit: Payer: BC Managed Care – PPO | Admitting: Radiation Oncology

## 2012-02-03 ENCOUNTER — Ambulatory Visit (INDEPENDENT_AMBULATORY_CARE_PROVIDER_SITE_OTHER): Payer: BC Managed Care – PPO | Admitting: Radiation Oncology

## 2012-02-03 ENCOUNTER — Encounter: Payer: Self-pay | Admitting: Radiation Oncology

## 2012-02-03 VITALS — BP 151/99 | HR 98 | Temp 98.9°F | Ht 70.0 in

## 2012-02-03 DIAGNOSIS — G8110 Spastic hemiplegia affecting unspecified side: Secondary | ICD-10-CM

## 2012-02-03 DIAGNOSIS — M7060 Trochanteric bursitis, unspecified hip: Secondary | ICD-10-CM

## 2012-02-03 DIAGNOSIS — Z23 Encounter for immunization: Secondary | ICD-10-CM

## 2012-02-03 DIAGNOSIS — Z0000 Encounter for general adult medical examination without abnormal findings: Secondary | ICD-10-CM | POA: Insufficient documentation

## 2012-02-03 DIAGNOSIS — M76899 Other specified enthesopathies of unspecified lower limb, excluding foot: Secondary | ICD-10-CM

## 2012-02-03 DIAGNOSIS — I10 Essential (primary) hypertension: Secondary | ICD-10-CM

## 2012-02-03 LAB — LIPID PANEL
Cholesterol: 254 mg/dL — ABNORMAL HIGH (ref 0–200)
HDL: 37 mg/dL — ABNORMAL LOW (ref >39)
LDL Cholesterol: 190 mg/dL — ABNORMAL HIGH (ref 0–99)
Total CHOL/HDL Ratio: 6.9 Ratio
Triglycerides: 135 mg/dL (ref <150)
VLDL: 27 mg/dL (ref 0–40)

## 2012-02-03 LAB — COMPREHENSIVE METABOLIC PANEL
ALT: 13 U/L (ref 0–53)
AST: 16 U/L (ref 0–37)
Albumin: 4 g/dL (ref 3.5–5.2)
Alkaline Phosphatase: 88 U/L (ref 39–117)
BUN: 14 mg/dL (ref 6–23)
CO2: 28 mEq/L (ref 19–32)
Calcium: 10.2 mg/dL (ref 8.4–10.5)
Chloride: 106 mEq/L (ref 96–112)
Creat: 0.89 mg/dL (ref 0.50–1.35)
Glucose, Bld: 91 mg/dL (ref 70–99)
Potassium: 4.2 mEq/L (ref 3.5–5.3)
Sodium: 143 mEq/L (ref 135–145)
Total Bilirubin: 0.5 mg/dL (ref 0.3–1.2)
Total Protein: 8.4 g/dL — ABNORMAL HIGH (ref 6.0–8.3)

## 2012-02-03 MED ORDER — LISINOPRIL-HYDROCHLOROTHIAZIDE 10-12.5 MG PO TABS: 1.0000 | ORAL_TABLET | Freq: Every day | ORAL | Status: DC

## 2012-02-03 MED ORDER — OXYCODONE HCL 5 MG PO CAPS: 5.0000 mg | ORAL_CAPSULE | Freq: Two times a day (BID) | ORAL | Status: DC

## 2012-02-03 MED ORDER — BACLOFEN 10 MG PO TABS: 10.0000 mg | ORAL_TABLET | Freq: Four times a day (QID) | ORAL | Status: DC

## 2012-02-03 MED ORDER — GABAPENTIN 100 MG PO CAPS: 200.0000 mg | ORAL_CAPSULE | Freq: Three times a day (TID) | ORAL | Status: DC

## 2012-02-03 MED ORDER — METOPROLOL TARTRATE 50 MG PO TABS: 50.0000 mg | ORAL_TABLET | Freq: Two times a day (BID) | ORAL | Status: DC

## 2012-02-03 NOTE — Assessment & Plan Note (Addendum)
Patient is prescribed oxycodone and gabapentin, which he states both help significantly with pain associated with this issue. Both of these medications were refilled today, however patient was made aware that he will need a followup with a neurologist or pain management specialist in the future for further management of this issue. -Refill oxycodone -Refill gabapentin

## 2012-02-03 NOTE — Assessment & Plan Note (Addendum)
Patient is on baclofen and gabapentin, with good relief from symptoms. He states he has followup scheduled with neurology in early 2014 at which time this issue will be further addressed. - Refill baclofen - Followup with neurology - f/u evaluation for electric wheelchair eligibility

## 2012-02-03 NOTE — Assessment & Plan Note (Signed)
BP Readings from Last 3 Encounters:  02/03/12 151/99  01/05/12 177/107  12/14/11 177/105    Lab Results  Component Value Date   NA 136 10/25/2011   K 3.8 10/25/2011   CREATININE 0.84 10/25/2011    Assessment:  Blood pressure control: mildly elevated  Progress toward BP goal:  unable to assess  Comments: above goal of <140/90 Plan:  Medications:  continue current medications, start HCTZ  Educational resources provided: brochure  Self management tools provided: home blood pressure logbook  Other plans: d/c lisinopril 10mg  and start lisinopril-HCTZ 10-12.5 mg. Cont metoprolol. Although clonidine was on patient's med list, he has not been taking this med as an outpatient, so it was removed.

## 2012-02-03 NOTE — Progress Notes (Signed)
  Subjective:    Patient ID: Jeremy [name], male    DOB: Oct 05, 1965, 46 y.o.   MRN: [id number]  HPI Patient is a very pleasant 46 year old male with past medical history of right basal ganglia hemorrhage in 09/2011 with residual left hemiparesis, HTN, who presents to clinic today as a new patient to establish care. The patient states he did not have a PCP prior to his stroke in 09/2011. Following the stroke, he underwent inpatient rehabilitation and then outpatient rehabilitation in a SNF. He has since been discharged from the SNF, and now has home health PT/OT, per his report. Patient had a G-tube in place until 01/05/2012, at which time it was removed with no complications. The patient states he is feeling well today, and is eating well without nausea or vomiting. He states that his left-sided hemiparesis is minimally improved. He request refills of many of his medications, but was made aware that in the future his neurologic and pain management medications should be managed by a neurologist (and also possibly a pain management specialist).    Review of Systems  Constitutional: Negative.   HENT: Negative.   Eyes: Negative.   Respiratory: Negative.   Cardiovascular: Negative.   Gastrointestinal: Negative.   Genitourinary: Negative for dysuria and hematuria.  Musculoskeletal: Positive for arthralgias (L hip).  Skin: Negative.   Neurological: Negative.   Hematological: Negative.   Psychiatric/Behavioral: Negative.        Objective:   Physical Exam  Constitutional: He is oriented to person, place, and time. He appears well-developed and well-nourished. No distress.       Wheelchair-bound.  HENT:  Head: Normocephalic and atraumatic.  Eyes: Pupils are equal, round, and reactive to light. No scleral icterus.  Neck: Normal range of motion. Neck supple. No tracheal deviation present.  Cardiovascular: Normal rate and regular rhythm.   Pulmonary/Chest: Effort normal. He has no wheezes. He has  no rales.  Abdominal: Soft. Bowel sounds are normal. He exhibits no distension. There is no tenderness.  Neurological: He is alert and oriented to person, place, and time. A sensory deficit (Decreased sensation in LUE, LLE) is present. No cranial nerve deficit.       5/5 strength in RUE and RLE; 1/5 strength in LUE and LLE.   Skin: Skin is warm and dry. No erythema.  Psychiatric: He has a normal mood and affect. His behavior is normal.          Assessment & Plan:

## 2012-02-03 NOTE — Assessment & Plan Note (Addendum)
Pt already received flu shot this year per [facility]. Given tdap today. Lipid panel drawn with results pending.

## 2012-02-03 NOTE — Patient Instructions (Addendum)

## 2012-02-07 ENCOUNTER — Telehealth: Payer: Self-pay | Admitting: *Deleted

## 2012-02-07 NOTE — Telephone Encounter (Signed)
Wife called and states pt was having a reaction to Lisinopril-HCTZ. Took med on 02/05/12  and 02/06/12 and noted under left rib cage area a red spot both days. Denies SOB or itching or hives. Wants different med called in to [facility]. States pharmacy will not fill pain med. Pt needs to call pharmacy and check when pain med can be picked up. Pharmacy has written Rx from 02/03/12. Stanton Kidney Adit Riddles RN 02/07/12 12:15PM

## 2012-02-07 NOTE — Telephone Encounter (Signed)
Talked with pt's wife regarding issue. Small L rib "blister" does not sound like a hypersensitivity rxn to lisinopril-HCTZ. No SOB, itching, or hives. Pt's wife instructed to continue giving her husband the lisinopril-HCTZ unless new/worsening symptoms develop (and to d/c med, go to ED if they do). Regarding pt's oxycodone prescription, pt's wife states the issue is resolved.

## 2012-02-11 ENCOUNTER — Emergency Department (HOSPITAL_COMMUNITY)
Admission: EM | Admit: 2012-02-11 | Discharge: 2012-02-11 | Disposition: A | Discharge status: Home / Self Care | Payer: BC Managed Care – PPO | Attending: Emergency Medicine | Admitting: Emergency Medicine

## 2012-02-11 ENCOUNTER — Telehealth: Payer: Self-pay | Admitting: *Deleted

## 2012-02-11 ENCOUNTER — Encounter (HOSPITAL_COMMUNITY): Payer: Self-pay

## 2012-02-11 ENCOUNTER — Telehealth: Payer: Self-pay | Admitting: Family Medicine

## 2012-02-11 DIAGNOSIS — Z79899 Other long term (current) drug therapy: Secondary | ICD-10-CM | POA: Insufficient documentation

## 2012-02-11 DIAGNOSIS — Z8673 Personal history of transient ischemic attack (TIA), and cerebral infarction without residual deficits: Secondary | ICD-10-CM | POA: Insufficient documentation

## 2012-02-11 DIAGNOSIS — I10 Essential (primary) hypertension: Secondary | ICD-10-CM | POA: Insufficient documentation

## 2012-02-11 NOTE — Telephone Encounter (Signed)
Please have her repeat his blood pressure, and also have her confirm his medications and find out whether he has taken them today.

## 2012-02-11 NOTE — ED Provider Notes (Signed)
History     CSN: [id number]  Arrival date & time 02/11/12  1711   First MD Initiated Contact with Patient 02/11/12 1713      No chief complaint on file.   (Consider location/radiation/quality/duration/timing/severity/associated sxs/prior treatment) Patient is a 46 y.o. male presenting with hypertension. The history is provided by the patient. No language interpreter was used.  Hypertension This is a chronic problem. Episode onset: undertemined. The problem occurs constantly. The problem has been gradually worsening. Pertinent negatives include no abdominal pain, chest pain, chills, congestion, coughing, fever, headaches, nausea, rash, sore throat or vomiting. The symptoms are aggravated by eating. He has tried nothing for the symptoms. The treatment provided significant relief.    Past Medical History  Diagnosis Date  . Hypertension 10/01/2011    basal ganglia  . Stroke     Past Surgical History  Procedure Date  . Tracheostomy tube placement 10/11/2011    Procedure: TRACHEOSTOMY;  Surgeon: Christia Reading, MD;  Location: [facility] OR;  Service: ENT;  Laterality: N/A;    Family History  Problem Relation Age of Onset  . Hypertension Mother   . Heart disease Father   . Gout Father   . Hypertension Father     History  Substance Use Topics  . Smoking status: Never Smoker   . Smokeless tobacco: Not on file  . Alcohol Use: No      Review of Systems  Constitutional: Negative for fever and chills.  HENT: Negative for congestion and sore throat.   Respiratory: Negative for cough and shortness of breath.   Cardiovascular: Negative for chest pain and leg swelling.  Gastrointestinal: Negative for nausea, vomiting, abdominal pain, diarrhea and constipation.  Genitourinary: Negative for dysuria and frequency.  Skin: Negative for color change and rash.  Neurological: Negative for dizziness and headaches.  Psychiatric/Behavioral: Negative for confusion and agitation.  All other systems  reviewed and are negative.    Allergies  Review of patient's allergies indicates no known allergies.  Home Medications   Current Outpatient Rx  Name  Route  Sig  Dispense  Refill  . BACLOFEN 10 MG PO TABS   Oral   Take 1 tablet (10 mg total) by mouth 4 (four) times daily.   120 each   1   . CLINDAMYCIN PHOSPHATE 1 % EX GEL   Topical   Apply topically 2 (two) times daily.         Marland Kitchen GABAPENTIN 100 MG PO CAPS   Oral   Take 2 capsules (200 mg total) by mouth 3 (three) times daily.   60 capsule   1   . LISINOPRIL-HYDROCHLOROTHIAZIDE 10-12.5 MG PO TABS   Oral   Take 1 tablet by mouth daily.   30 tablet   5   . METOPROLOL TARTRATE 50 MG PO TABS   Oral   Take 1 tablet (50 mg total) by mouth 2 (two) times daily.   60 tablet   5   . OXYCODONE HCL 5 MG PO CAPS   Oral   Take 1 capsule (5 mg total) by mouth 2 (two) times daily.   60 capsule   0   . SENNOSIDES 8.6 MG PO TABS   Oral   Take 1 tablet by mouth 2 (two) times daily.         Jeananne Rama SULFATE 0.05-0.25 % OP SOLN   Left Eye   Place 2 drops into the left eye as needed.         Marland Kitchen  TRAMADOL HCL 50 MG PO TABS   Oral   Take 50 mg by mouth 4 (four) times daily.           There were no vitals taken for this visit.  Physical Exam  Constitutional: He is oriented to person, place, and time. He appears well-developed and well-nourished. No distress.  HENT:  Head: Normocephalic and atraumatic.  Eyes: EOM are normal. Pupils are equal, round, and reactive to light.  Neck: Normal range of motion. Neck supple.  Cardiovascular: Normal rate and regular rhythm.   Pulmonary/Chest: Effort normal. No respiratory distress.  Abdominal: Soft. He exhibits no distension.  Musculoskeletal: Normal range of motion. He exhibits no edema.  Neurological: He is alert and oriented to person, place, and time. No cranial nerve deficit or sensory deficit. He exhibits abnormal muscle tone (left sided hemiparesis). GCS  eye subscore is 4. GCS verbal subscore is 5. GCS motor subscore is 6.  Skin: Skin is warm and dry.  Psychiatric: He has a normal mood and affect. His behavior is normal.    ED Course  Procedures (including critical care time)  Labs Reviewed - No data to display No results found.   No diagnosis found.    MDM  Pt w/ pmhx of HTN and CVA presents to ED for elevated BP. Was seen by home health today and BP noted to be 180/110. Called pcp and recommend ED transfer. States BP typical runs 160/100. denies chest pain, dyspnea, headache, urine changes. Has not missed any doses of metoprolol or lisinopril. States he did eat a cheeseburger, denies tobacco or stimulant use. On arrival BP is 143/92, neuro exam unchanged from pts baseline left hemiparesis. Normal strength and sensation on right. Heart RRR no m/g/r. ECG w/out signs of ischemia. At this time does not appear to be hypertensive emergency. No indication to acutely lower BP. No signs of end organ damage, no indication for CT head. Stable for dc home. Recommend close follow up w/ pcp in 2 days for medication adjustment. D/c  Home in good and stable condition, given return precautions.   1. Hypertension    Elfredia Nevins, MD [address] [place] [address] [phone]  Schedule an appointment as soon as possible for a visit on 02/14/2012          Audelia Hives, MD 02/12/12 [phone]

## 2012-02-11 NOTE — ED Notes (Signed)
MD at bedside. 

## 2012-02-11 NOTE — ED Notes (Signed)
Pt sent here by primary medical doctor for evaluation of high blood pressure

## 2012-02-11 NOTE — Telephone Encounter (Signed)
i spoke w/ jennifer HHN, again, she states pt is taking all his meds per his wife and him also she only checked his pressure 1 time, she left the house after calling the clinic, i called pt and spoke w/ him, he states he feels good and yes he took all his meds this am as he usually does, he states he takes them at appr the same time every day and does not miss taking them. He denies h/a, weakness, N&V, chest pain, shortness of breath.

## 2012-02-11 NOTE — Telephone Encounter (Signed)
HHN calls and states pt's BP at visit currently is 166/120 w/ large adult manual cuff in R arm, unable to use L, denies h/a, dizziness, N&V, weakness, visual changes. Please advise. Pt uses [facility]

## 2012-02-11 NOTE — ED Notes (Signed)
Waiting for [facility] for transportation

## 2012-02-11 NOTE — Telephone Encounter (Signed)
Jeremy [name], [facility] calls w/ BP 164/124, dr Meredith Pel is ask to come to triage and he spoke w/ pt, spouse and HHN via ph

## 2012-02-11 NOTE — Telephone Encounter (Signed)
I spoke with nurse and patient by phone.  On repeat measurement his blood pressure is 164/124; he confirmed that he has taken his metoprolol and lisinopril-HCTZ today and has not missed recent doses.  Although he is asymptomatic, given his history of hemorrhagic stroke in August and his markedly elevated diastolic BP on two separate checks today, I advised him to go to the ED for evaluation.  Patient agreed to do so, and he will call EMS for transport.

## 2012-02-11 NOTE — Telephone Encounter (Signed)
Per dr Meredith Pel i have called the [facility] to return to pt's home and retake BP, i have asked her to call from the home and to remain as i speak w/ dr Meredith Pel, when she calls i will call dr Meredith Pel in resident work room and ask him to come to triage for further eval by [facility]

## 2012-02-11 NOTE — Telephone Encounter (Signed)
Patient's daughter called emergency line wanting to let Dr. Lavena Bullion know that Jeremy [name] was in the Emergency room for elevated blood pressure and wanted to know if doctor would see him in ED. I told her that he would be evaluated by the ER doctors and that Dr. Lavena Bullion would be notified.  Recommended that he follow up in clinic next week to talk about BP control. Patient's daughter agreed.    Jeremy [name], PGY-2 Family Medicine Resident  Of note, I took call thinking that patient was [name] patient (from [facility]) but realized after hanging up that he is patient of [name].

## 2012-02-12 NOTE — ED Provider Notes (Signed)
I saw and evaluated the patient, reviewed the resident's note and I agree with the findings and plan.  Jeremy Chick, MD 02/12/12 [phone]

## 2012-02-17 ENCOUNTER — Ambulatory Visit (INDEPENDENT_AMBULATORY_CARE_PROVIDER_SITE_OTHER): Payer: BC Managed Care – PPO | Admitting: Internal Medicine

## 2012-02-17 ENCOUNTER — Encounter: Payer: Self-pay | Admitting: Internal Medicine

## 2012-02-17 VITALS — BP 136/90 | HR 70 | Temp 97.0°F | Ht 70.0 in

## 2012-02-17 DIAGNOSIS — I10 Essential (primary) hypertension: Secondary | ICD-10-CM

## 2012-02-17 DIAGNOSIS — L819 Disorder of pigmentation, unspecified: Secondary | ICD-10-CM

## 2012-02-17 MED ORDER — HYDROCHLOROTHIAZIDE 25 MG PO TABS: 25.0000 mg | ORAL_TABLET | Freq: Every day | ORAL | Status: DC

## 2012-02-17 NOTE — Progress Notes (Signed)
Subjective:   Patient ID: Jeremy [name] male   DOB: 05-16-1965 47 y.o.   MRN: [id number]  HPI: 47 year old man with past medical history significant for hypertension, right basal ganglia hemorrhagic stroke with resultant left hemiparesis in 08/13  presents to the clinic for a followup on hypertension.  Home health nurse called on 02/11/12 for a blood pressure of 167/124 for which patient was advised to go to the ER for evaluation. At the ED visit his blood pressure was found to be  131- 143/ 82-92 and he was discharged home without making any changes to his home regimen of lisinopril-HCTZ and metoprolol. He comes for a followup visit today and his blood pressure was noted to be systolics in the 130s to 140s with diastolics in 90s to 100 's. Patient's wife expressed some concerns with the start of the new medication( lisinopril-HCTZ ) with the last visit. Initially she said that patient was noticed to have some hyperpigmentation of the skin of his right hand from the medication but later she said he was noticed to have increased cough from the new medication. She was insisting to change the medication. She thought may be skin hyperpigmentation may be due to the use of new soap at his SNF.    Past Medical History  Diagnosis Date  . Hypertension 10/01/2011    basal ganglia  . Stroke    Family History  Problem Relation Age of Onset  . Hypertension Mother   . Heart disease Father   . Gout Father   . Hypertension Father    History   Social History  . Marital Status: Married    Spouse Name: N/A    Number of Children: N/A  . Years of Education: N/A   Occupational History  . Not on file.   Social History Main Topics  . Smoking status: Never Smoker   . Smokeless tobacco: Not on file  . Alcohol Use: No  . Drug Use: No  . Sexually Active: Not on file   Other Topics Concern  . Not on file   Social History Narrative  . No narrative on file   Review of Systems: General: Denies fever,  chills, diaphoresis, appetite change and fatigue. HEENT: Denies photophobia, eye pain, redness, hearing loss, ear pain, congestion, sore throat, rhinorrhea, sneezing, mouth sores, trouble swallowing, neck pain, neck stiffness and tinnitus. Respiratory: Denies SOB, DOE, cough, chest tightness, and wheezing. Cardiovascular: Denies to chest pain, palpitations and leg swelling. Gastrointestinal: Denies nausea, vomiting, abdominal pain, diarrhea, constipation, blood in stool and abdominal distention. Genitourinary: Denies dysuria, urgency, frequency, hematuria, flank pain and difficulty urinating. Musculoskeletal: Denies myalgias, back pain, joint swelling, arthralgias and gait problem.  Skin: Denies pallor, rash and wound. Neurological: Denies dizziness, seizures, syncope, weakness, light-headedness, numbness and headaches. Hematological: Denies adenopathy, easy bruising, personal or family bleeding history. Psychiatric/Behavioral: Denies suicidal ideation, mood changes, confusion, nervousness, sleep disturbance and agitation.    Current Outpatient Medications: Current Outpatient Prescriptions  Medication Sig Dispense Refill  . baclofen (LIORESAL) 10 MG tablet Take 1 tablet (10 mg total) by mouth 4 (four) times daily.  120 each  1  . clindamycin (CLINDAGEL) 1 % gel Apply 1 application topically 2 (two) times daily.       Marland Kitchen gabapentin (NEURONTIN) 100 MG capsule Take 2 capsules (200 mg total) by mouth 3 (three) times daily.  60 capsule  1  . hydrochlorothiazide (HYDRODIURIL) 25 MG tablet Take 1 tablet (25 mg total) by mouth daily.  30 tablet  0  . metoprolol (LOPRESSOR) 50 MG tablet Take 1 tablet (50 mg total) by mouth 2 (two) times daily.  60 tablet  5  . oxycodone (OXY-IR) 5 MG capsule Take 1 capsule (5 mg total) by mouth 2 (two) times daily.  60 capsule  0  . senna (SENOKOT) 8.6 MG tablet Take 1 tablet by mouth 2 (two) times daily.      Marland Kitchen tetrahydrozoline-zinc (EYE DROPS RELIEF) 0.05-0.25 %  ophthalmic solution Place 2 drops into the left eye as needed. For redness      . traMADol (ULTRAM) 50 MG tablet Take 50 mg by mouth 4 (four) times daily.        Allergies: No Known Allergies    Objective:   Physical Exam: Filed Vitals:   02/17/12 1606  BP: 147/103  Pulse: 70  Temp: 97 F (36.1 C)    General: Vital signs reviewed and noted. Well-developed, well-nourished, in no acute distress; alert, appropriate and cooperative throughout examination. In Wheel chair Head: Normocephalic, atraumatic Lungs: Normal respiratory effort. Clear to auscultation BL without crackles or wheezes. Heart: RRR. S1 and S2 normal without gallop, murmur, or rubs. Abdomen:BS normoactive. Soft, Nondistended, non-tender.  No masses or organomegaly. Extremities: No pretibial edema. Neurological: motor strength : LUE and LLE: 1/5, RUE and RLE- 5/5 Skin: some darkening/ hyperpigmentation was noticed on the dorsal aspect of his right hand with no skin breakdown        Assessment & Plan:

## 2012-02-17 NOTE — Patient Instructions (Addendum)
General Instructions: Please schedule a follow up appointment in  1 week . Please bring your medication bottles with your next appointment. Please take your medicines as prescribed.     Treatment Goals:  Goals (1 Years of Data) as of 02/17/2012          As of Today 02/11/12 02/11/12 02/11/12 02/11/12     Blood Pressure    . Blood Pressure < 140/90  147/103 153/89 138/90 139/87 154/94      Progress Toward Treatment Goals:  Treatment Goal 02/17/2012  Blood pressure deteriorated    Self Care Goals & Plans:  Self Care Goal 02/17/2012  Manage my medications take my medicines as prescribed; bring my medications to every visit; refill my medications on time  Monitor my health -  Eat healthy foods drink diet soda or water instead of juice or soda; eat fruit for snacks and desserts; eat more vegetables; eat foods that are low in salt  Be physically active find an activity I enjoy       Care Management & Community Referrals:  Referral 02/17/2012  Referrals made for care management support none needed

## 2012-02-18 ENCOUNTER — Other Ambulatory Visit: Payer: Self-pay | Admitting: *Deleted

## 2012-02-18 NOTE — Telephone Encounter (Signed)
For the BP, he can take meds QD - inc the day of his appt - and F/U on the 14th  I am OK with changing Neurontin to QHS and F/U on the 14th to see how that change did.

## 2012-02-18 NOTE — Assessment & Plan Note (Addendum)
Adequate blood pressure control is prudent in this patient given his history of recent hemorrhagic stroke. His blood pressure was noted to be in the 130s to 140s systolic the diastolics in 90s -100s in the clinic. Patient's wife had concerns with the start of new medication ( lisinopril -HCTZ). She also called Dr. Lavena Bullion once complaining of some blister on patient left rib cage with the start of new medication and now she is complaining of some cough. I would discontinue lisinopril component from the medication and would continue him on HCTZ. Would increase the dose of HCTZ to 25 mg a day. Continue metoprolol  at the current dose. -Reschedule followup visit in one week for blood pressure recheck. -D/C lisinopril- HCTZ. -Start him on HCTZ 25 mg daily. -Continue metoprolol at current dose.

## 2012-02-18 NOTE — Telephone Encounter (Signed)
Pt needs to rest - no smoking, caffeine, talking - and recheck the BP. Pt was just seen yesterday with similar BP and meds were changed. Has F/U appt on the 14th.

## 2012-02-18 NOTE — Telephone Encounter (Signed)
HHN states pt is having problems w/ neurotin, seems he is not responding well to it, she states it "knocks him out", she has spoken to him and wants to know if it can be changed to  One time nightly, to control night time pain. She has now called back and BP is unchanged, remains at 140/110, he has now stated he has not taken his morning bp meds, he will do so now. She will visit again Monday 1/6, he is cautioned that if he becomes symptomatic he is to go to ED, she has educated him on this

## 2012-02-18 NOTE — Telephone Encounter (Signed)
i spoke w/ jennifer, HHN again, neurotin to 1 time at bedtime, no tramadol, should have oxycodone to last thru to appt

## 2012-02-18 NOTE — Telephone Encounter (Signed)
HHN calls from pt home pt BP 140/110, please advise

## 2012-02-18 NOTE — Telephone Encounter (Signed)
Oxycodone, baclofen, and gabapentin were mentioned by Dr McTyre's last note. Tramadol wasn't. Will forward to Dr Lavena Bullion for consideration.

## 2012-02-18 NOTE — Telephone Encounter (Signed)
I don't see the need for tramadol as he is already taking oxycodone. If the pt/pt's wife have compelling reasons they feel he should be taking it, this can be discussed at his next appointment.

## 2012-02-19 DIAGNOSIS — L819 Disorder of pigmentation, unspecified: Secondary | ICD-10-CM | POA: Insufficient documentation

## 2012-02-19 NOTE — Assessment & Plan Note (Addendum)
Hyperpigmentation of skin was noticed on his right hand that apparently has been present since his d/c from SNF for over last 3 months. It could be contact dermatitis but time frame makes it less likely especially with persistence even after discontinuation of the product used at SNF? Marland Kitchen Etiology unclear at this time. Patient denies any itching. Continue to monitor.

## 2012-02-29 ENCOUNTER — Encounter: Payer: Self-pay | Admitting: Radiation Oncology

## 2012-02-29 ENCOUNTER — Ambulatory Visit (INDEPENDENT_AMBULATORY_CARE_PROVIDER_SITE_OTHER): Payer: BC Managed Care – PPO | Admitting: Radiation Oncology

## 2012-02-29 VITALS — BP 132/87 | HR 78 | Temp 97.4°F

## 2012-02-29 DIAGNOSIS — M76899 Other specified enthesopathies of unspecified lower limb, excluding foot: Secondary | ICD-10-CM

## 2012-02-29 DIAGNOSIS — I10 Essential (primary) hypertension: Secondary | ICD-10-CM

## 2012-02-29 DIAGNOSIS — E785 Hyperlipidemia, unspecified: Secondary | ICD-10-CM

## 2012-02-29 DIAGNOSIS — M7060 Trochanteric bursitis, unspecified hip: Secondary | ICD-10-CM

## 2012-02-29 MED ORDER — HYDROCHLOROTHIAZIDE 25 MG PO TABS: 25.0000 mg | ORAL_TABLET | Freq: Every day | ORAL | Status: DC

## 2012-02-29 MED ORDER — TRAMADOL HCL 50 MG PO TABS: 50.0000 mg | ORAL_TABLET | Freq: Four times a day (QID) | ORAL | Status: DC | PRN

## 2012-02-29 MED ORDER — GABAPENTIN 100 MG PO CAPS: 200.0000 mg | ORAL_CAPSULE | Freq: Every day | ORAL | Status: DC

## 2012-02-29 NOTE — Patient Instructions (Addendum)
General patient instructions: Please continue to take your medications as prescribed with the following changes: - stop taking oxycodone and begin taking tramadol as needed for pain  Note to Home Health Nursing:  Please only check patient's blood pressure when at rest for greater than 30 minutes and while he is not talking.   Treatment Goals:  Goals (1 Years of Data) as of 02/29/2012          As of Today 02/17/12 02/17/12 02/11/12 02/11/12     Blood Pressure    . Blood Pressure < 140/90  132/87 136/90 147/103 153/89 138/90      Progress Toward Treatment Goals:  Treatment Goal 02/29/2012  Blood pressure at goal    Self Care Goals & Plans:  Self Care Goal 02/29/2012  Manage my medications take my medicines as prescribed; refill my medications on time; bring my medications to every visit  Monitor my health keep track of my blood pressure  Eat healthy foods drink diet soda or water instead of juice or soda; eat foods that are low in salt; eat more vegetables  Be physically active (No Data)       Care Management & Community Referrals:  Referral 02/17/2012  Referrals made for care management support none needed

## 2012-03-01 ENCOUNTER — Telehealth: Payer: Self-pay | Admitting: *Deleted

## 2012-03-01 DIAGNOSIS — E785 Hyperlipidemia, unspecified: Secondary | ICD-10-CM | POA: Insufficient documentation

## 2012-03-01 NOTE — Assessment & Plan Note (Addendum)
LDL is elevated, however the data supporting the use of statin therapy in patient's with a history of hemorrhagic stroke is conflicting (with some studies indicating it may increase the risk of hemorrhage).  - no statin initiated (although if patient's neurologist decides to pursue statin therapy this is reasonable)  Lab Results  Component Value Date   LDLCALC 190* 02/03/2012

## 2012-03-01 NOTE — Assessment & Plan Note (Signed)
The patient feels that his pain is improved and that he is willing to switch from oxycodone to tramadol for future pain control. He was commended on this decision, as long-term high-potency opioid therapy has many associated risks.  - d/c oxycodone - tramadol 50mg  q6h PRN

## 2012-03-01 NOTE — Telephone Encounter (Signed)
Call from [facility] with  [facility]  #  [phone] Pt is being d/c from skilled nursing services  PT and OT will follow at home.  All goals have been met.  FYI

## 2012-03-01 NOTE — Assessment & Plan Note (Addendum)
BP Readings from Last 3 Encounters:  02/29/12 132/87  02/17/12 136/90  02/11/12 153/89    Lab Results  Component Value Date   NA 143 02/03/2012   K 4.2 02/03/2012   CREATININE 0.89 02/03/2012    Assessment:  Blood pressure control: controlled  Progress toward BP goal:  at goal  Comments: The elevated BPs reported by [facility] nursing seem to be in association with exertion only. BPs have been at goal the last two clinic visits, and the patient's wife states they are at goal when he is at rest.   Plan:  Medications:  continue current medications: HCTZ 25mg  QD  Educational resources provided:    Self management tools provided:    Other plans: I provided the patient and his wife with documentation to support that he should only have his BP checked when at rest. They were instructed to call the clinic if BP is elevated at rest, as he may need modification of his anti-HTN regimen (as BP control <140/90 is of high importance in the setting of his recent hemorrhagic stroke).

## 2012-03-01 NOTE — Progress Notes (Signed)
  Subjective:    Patient ID: Jeremy [name], male    DOB: Mar 27, 1965, 47 y.o.   MRN: [id number]  HPI Jeremy [name] is a 47yo gentleman with PMH of R basal ganglia hemorrhagic stroke who returns to clinic for a visit today for routine follow-up and medication refills. He states he is feeling well and has been regaining a little bit of function in his L hand through his extensive work with home health PT and OT. He also has been receiving continued [facility] nursing services since his previous visit. The patient and his wife relate that their [facility] nurse has been concerned regarding his BP being elevated >140/90, which has resulted in her calling the clinic numerous times and also in telling the patient to go to the ED for this issue. They provide a handwritten document the nurse had written that states his BP has sometimes been elevated to ~170-190/110-120. The patient's wife expresses frustration with this, and feels that these BPs are always taken after he has exerted himself heavily with PT, and that otherwise his BPs have been normal (as they have been when he has been in clinic recently and when they went to the ED).   The patient has no complaints today, and is in good spirits. He and his wife state that he has a follow-up appointment with neurology on 03/05/2012.    Review of Systems  All other systems reviewed and are negative.       Objective:   Physical Exam  Constitutional: He is oriented to person, place, and time. He appears well-developed and well-nourished. No distress.       Wheelchair-bound.  HENT:  Head: Normocephalic and atraumatic.  Eyes: Pupils are equal, round, and reactive to light. No scleral icterus.  Neck: Normal range of motion. Neck supple. No tracheal deviation present.  Cardiovascular: Normal rate and regular rhythm.   No murmur heard. Pulmonary/Chest: Effort normal. He has no wheezes. He has no rales.  Abdominal: Soft. Bowel sounds are normal. There is no tenderness.    Musculoskeletal: Normal range of motion. He exhibits no edema.  Neurological: He is alert and oriented to person, place, and time. No cranial nerve deficit.       5/5 strength in RUE and RLE. 1/5 strength in LUE, LLE.           Assessment & Plan:

## 2012-03-08 ENCOUNTER — Other Ambulatory Visit: Payer: Self-pay | Admitting: *Deleted

## 2012-03-10 NOTE — Telephone Encounter (Signed)
review 

## 2012-03-23 ENCOUNTER — Telehealth: Payer: Self-pay | Admitting: *Deleted

## 2012-03-23 NOTE — Telephone Encounter (Signed)
Pt's wife called to let you know she did some research and read information of neurontin and feels that the swelling to pts left hand is a side effect of neuronitin.  He had the swelling last year also.  Swelling present at last visit on the 14th.   Also, she feels rash on right hand is also side effect of neurontin. He had this rash  last  year and it is present again. She just wants to you know. Pt # [id number]

## 2012-03-23 NOTE — Telephone Encounter (Signed)
Thank you. Dr Lavena Bullion is out for two weeks. The note did not indicate that anything else needed done. PT can D/C med if so desire.

## 2012-03-27 NOTE — Telephone Encounter (Signed)
Pt informed and will wait and talk with Dr Lavena Bullion when he returns.

## 2012-04-03 ENCOUNTER — Ambulatory Visit: Payer: BC Managed Care – PPO | Admitting: Physical Therapy

## 2012-04-07 ENCOUNTER — Encounter: Payer: Self-pay | Admitting: Physical Medicine & Rehabilitation

## 2012-04-07 ENCOUNTER — Encounter
Payer: BC Managed Care – PPO | Attending: Physical Medicine & Rehabilitation | Admitting: Physical Medicine & Rehabilitation

## 2012-04-07 VITALS — BP 139/86 | HR 74 | Resp 14 | Ht 70.0 in | Wt 250.0 lb

## 2012-04-07 DIAGNOSIS — M76899 Other specified enthesopathies of unspecified lower limb, excluding foot: Secondary | ICD-10-CM

## 2012-04-07 DIAGNOSIS — R1310 Dysphagia, unspecified: Secondary | ICD-10-CM | POA: Insufficient documentation

## 2012-04-07 DIAGNOSIS — I619 Nontraumatic intracerebral hemorrhage, unspecified: Secondary | ICD-10-CM

## 2012-04-07 DIAGNOSIS — G8110 Spastic hemiplegia affecting unspecified side: Secondary | ICD-10-CM

## 2012-04-07 DIAGNOSIS — I10 Essential (primary) hypertension: Secondary | ICD-10-CM

## 2012-04-07 DIAGNOSIS — M7060 Trochanteric bursitis, unspecified hip: Secondary | ICD-10-CM

## 2012-04-07 MED ORDER — TRAMADOL HCL 50 MG PO TABS: 50.0000 mg | ORAL_TABLET | Freq: Four times a day (QID) | ORAL | Status: DC | PRN

## 2012-04-07 NOTE — Progress Notes (Signed)
Subjective:    Patient ID: Jeremy [name], male    DOB: [birth year], 47 y.o.   MRN: [id number]  HPI Jeremy [name] is back regarding his right BG/thalamic hemorrhage. He was here last for a G tube removal which went without any problems  Therapy has stopped coming to the house. His wife gets him up in the AM and he stays in his chair the rest of the day. He still needs quite a bit of assistance for transfers. He has a wheelchair eval pending at neuro rehab in March. Currently he is doing some simple exercises for ROM at home.   From a pain standpoint he has intermittent hip pain on the left, but it's manageable currently with tramadol. He will rarely use oxycodone.   Pain Inventory Average Pain 5 Pain Right Now 5 My pain is intermittent, tingling and aching  In the last 24 hours, has pain interfered with the following? General activity 5 Relation with others 5 Enjoyment of life 5 What TIME of day is your pain at its worst? night Sleep (in general) Fair  Pain is worse with: some activites Pain improves with: medication Relief from Meds: 5  Mobility use a wheelchair needs help with transfers Do you have any goals in this area?  yes  Function I need assistance with the following:  feeding, dressing, bathing, toileting, meal prep, household duties and shopping Do you have any goals in this area?  yes  Neuro/Psych trouble walking anxiety  Prior Studies Any changes since last visit?  no  Physicians involved in your care Any changes since last visit?  no   Family History  Problem Relation Age of Onset  . Hypertension Mother   . Heart disease Father   . Gout Father   . Hypertension Father    History   Social History  . Marital Status: Married    Spouse Name: N/A    Number of Children: N/A  . Years of Education: N/A   Social History Main Topics  . Smoking status: Never Smoker   . Smokeless tobacco: None  . Alcohol Use: No  . Drug Use: No  . Sexually Active: None    Other Topics Concern  . None   Social History Narrative  . None   Past Surgical History  Procedure Laterality Date  . Tracheostomy tube placement  10/11/2011    Procedure: TRACHEOSTOMY;  Surgeon: Christia Reading, MD;  Location: [facility] OR;  Service: ENT;  Laterality: N/A;   Past Medical History  Diagnosis Date  . Hypertension 10/01/2011    basal ganglia  . Stroke    BP 139/86  Pulse 74  Resp 14  Ht 5\' 10"  (1.778 m)  Wt 250 lb (113.399 kg)  BMI 35.87 kg/m2  SpO2 99%     Review of Systems  Musculoskeletal: Positive for myalgias, arthralgias and gait problem.  Psychiatric/Behavioral: The patient is nervous/anxious.   All other systems reviewed and are negative.       Objective:   Physical Exam General: Alert and oriented x 3, No apparent distress, overweight  HEENT: Head is normocephalic, atraumatic, PERRLA, EOMI, exopthalmos, sclera anicteric, oral mucosa pink and moist, dentition poor, ext ear canals clear,  Neck: Supple without JVD or lymphadenopathy  Heart: Reg rate and rhythm. No murmurs rubs or gallops  Chest: CTA bilaterally without wheezes, rales, or rhonchi; no distress  Abdomen: Soft, non-tender, non-distended, bowel sounds positive. G-tube is intact without drainage. Area around tube is granulated.  Extremities: No clubbing, cyanosis,  or edema. Pulses are 2+  Skin: Clean and intact without signs of breakdown  Neuro: Pt is cognitively appropriate with normal insight, memory, and awareness.speech volume improved, minimal dysarthria. Cranial nerves 2-12 are notable for better tracking and conjugate gaze, mild left facial weakness... Sensory exam is 0-1/2 on the left. Reflexes are 3+ on the left. Minimal resting tone. He does have 1-2 beats of clonus on the left.. 5/5 motor on the right. LUE is 0-1/5. LLE is 1-2-/5. Marland Kitchen  Musculoskeletal: Full ROM, mild pain with palpation over left greater trochanter. Minimal pain with ER, IR, and flexion of the the left hip. Left shoulder  sublux of 0.5 inches Psych: Pt's affect is appropriate. Pt is cooperative   Assessment & Plan:   ASSESSMENT: 1. Right BG/thalamic hemorrhage- he is making gradual progress  2. HTN  3. Dysphagia (resolved) with G-tube.  4. Spastic left hemiparesis.  5. Mild left greater trochanter bursitis   Plan:  1. Will make a referral to neuro rehab for mobility training and ROM, strengthening. Unfortunately, his sensory loss might be the rate limiting factor. I think he is appropriate for a powered wheelchair given his ongoing neurological deficits.  2. Recommended dental follow up for his teeth. Several are decayed and are presenting an active risk for infection. 4. Maintain baclofen for spasticity management for now. Discussed support for left shoulder subluxation.  5. Blood pressure control with lopressor, HCTZ, 6. Refilled tramadol for breakthrough 7. Follow up here in about 3 months.

## 2012-04-07 NOTE — Patient Instructions (Signed)
WORK ON KEEPING YOUR LEFT SHOULDER ELEVATED WHILE YOU ARE SEATED OR LYING DOWN.

## 2012-04-13 ENCOUNTER — Ambulatory Visit: Payer: BC Managed Care – PPO | Attending: Physical Medicine & Rehabilitation | Admitting: Physical Therapy

## 2012-04-13 DIAGNOSIS — R269 Unspecified abnormalities of gait and mobility: Secondary | ICD-10-CM | POA: Insufficient documentation

## 2012-04-13 DIAGNOSIS — M6281 Muscle weakness (generalized): Secondary | ICD-10-CM | POA: Insufficient documentation

## 2012-04-13 DIAGNOSIS — IMO0001 Reserved for inherently not codable concepts without codable children: Secondary | ICD-10-CM | POA: Insufficient documentation

## 2012-04-18 ENCOUNTER — Ambulatory Visit: Payer: BC Managed Care – PPO | Admitting: Physical Therapy

## 2012-04-20 ENCOUNTER — Ambulatory Visit: Payer: BC Managed Care – PPO | Admitting: Physical Therapy

## 2012-04-25 ENCOUNTER — Ambulatory Visit: Payer: BC Managed Care – PPO | Attending: Neurology | Admitting: Physical Therapy

## 2012-04-25 DIAGNOSIS — Z5189 Encounter for other specified aftercare: Secondary | ICD-10-CM | POA: Insufficient documentation

## 2012-04-25 DIAGNOSIS — R269 Unspecified abnormalities of gait and mobility: Secondary | ICD-10-CM | POA: Insufficient documentation

## 2012-04-25 DIAGNOSIS — R279 Unspecified lack of coordination: Secondary | ICD-10-CM | POA: Insufficient documentation

## 2012-04-25 DIAGNOSIS — M6281 Muscle weakness (generalized): Secondary | ICD-10-CM | POA: Insufficient documentation

## 2012-04-27 ENCOUNTER — Ambulatory Visit: Payer: BC Managed Care – PPO | Admitting: Physical Therapy

## 2012-05-02 ENCOUNTER — Ambulatory Visit: Payer: BC Managed Care – PPO | Admitting: Physical Therapy

## 2012-05-03 ENCOUNTER — Ambulatory Visit: Payer: Self-pay | Admitting: Neurology

## 2012-05-04 ENCOUNTER — Ambulatory Visit: Payer: Self-pay | Admitting: Neurology

## 2012-05-04 ENCOUNTER — Ambulatory Visit: Payer: BC Managed Care – PPO | Admitting: Physical Therapy

## 2012-05-10 ENCOUNTER — Ambulatory Visit: Payer: BC Managed Care – PPO | Admitting: Occupational Therapy

## 2012-05-10 ENCOUNTER — Ambulatory Visit: Payer: BC Managed Care – PPO | Admitting: Physical Therapy

## 2012-05-16 ENCOUNTER — Encounter: Payer: BC Managed Care – PPO | Admitting: Occupational Therapy

## 2012-05-16 ENCOUNTER — Ambulatory Visit: Payer: BC Managed Care – PPO | Admitting: Physical Therapy

## 2012-05-18 ENCOUNTER — Ambulatory Visit: Payer: BC Managed Care – PPO | Admitting: Physical Therapy

## 2012-05-18 ENCOUNTER — Encounter: Payer: BC Managed Care – PPO | Admitting: Occupational Therapy

## 2012-05-23 ENCOUNTER — Ambulatory Visit: Payer: BC Managed Care – PPO | Attending: Neurology | Admitting: Occupational Therapy

## 2012-05-23 ENCOUNTER — Ambulatory Visit: Payer: BC Managed Care – PPO | Admitting: Physical Therapy

## 2012-05-23 DIAGNOSIS — R269 Unspecified abnormalities of gait and mobility: Secondary | ICD-10-CM | POA: Insufficient documentation

## 2012-05-23 DIAGNOSIS — IMO0001 Reserved for inherently not codable concepts without codable children: Secondary | ICD-10-CM | POA: Insufficient documentation

## 2012-05-23 DIAGNOSIS — M6281 Muscle weakness (generalized): Secondary | ICD-10-CM | POA: Insufficient documentation

## 2012-05-24 ENCOUNTER — Encounter: Payer: BC Managed Care – PPO | Admitting: Physical Medicine & Rehabilitation

## 2012-05-25 ENCOUNTER — Ambulatory Visit: Payer: BC Managed Care – PPO | Admitting: Physical Therapy

## 2012-05-25 ENCOUNTER — Ambulatory Visit: Payer: BC Managed Care – PPO | Admitting: Occupational Therapy

## 2012-05-29 ENCOUNTER — Ambulatory Visit: Payer: BC Managed Care – PPO | Admitting: Physical Therapy

## 2012-05-29 ENCOUNTER — Ambulatory Visit: Payer: BC Managed Care – PPO | Admitting: Occupational Therapy

## 2012-05-31 ENCOUNTER — Ambulatory Visit: Payer: BC Managed Care – PPO | Admitting: Physical Therapy

## 2012-05-31 ENCOUNTER — Encounter: Payer: BC Managed Care – PPO | Admitting: Occupational Therapy

## 2012-06-01 ENCOUNTER — Ambulatory Visit: Payer: BC Managed Care – PPO | Admitting: Physical Therapy

## 2012-06-01 ENCOUNTER — Ambulatory Visit: Payer: BC Managed Care – PPO | Admitting: Occupational Therapy

## 2012-06-05 ENCOUNTER — Encounter: Payer: Self-pay | Admitting: Physical Therapy

## 2012-06-06 ENCOUNTER — Ambulatory Visit: Payer: BC Managed Care – PPO | Admitting: Occupational Therapy

## 2012-06-06 ENCOUNTER — Encounter: Payer: BC Managed Care – PPO | Admitting: Radiation Oncology

## 2012-06-06 ENCOUNTER — Ambulatory Visit: Payer: BC Managed Care – PPO | Admitting: Physical Therapy

## 2012-06-08 ENCOUNTER — Ambulatory Visit: Payer: BC Managed Care – PPO | Admitting: Physical Therapy

## 2012-06-09 ENCOUNTER — Ambulatory Visit: Payer: BC Managed Care – PPO | Admitting: Occupational Therapy

## 2012-06-12 ENCOUNTER — Ambulatory Visit: Payer: BC Managed Care – PPO | Admitting: Radiation Oncology

## 2012-06-13 ENCOUNTER — Ambulatory Visit: Payer: BC Managed Care – PPO | Admitting: Occupational Therapy

## 2012-06-13 ENCOUNTER — Ambulatory Visit: Payer: BC Managed Care – PPO | Admitting: Physical Therapy

## 2012-06-14 ENCOUNTER — Encounter
Payer: BC Managed Care – PPO | Attending: Physical Medicine & Rehabilitation | Admitting: Physical Medicine & Rehabilitation

## 2012-06-14 DIAGNOSIS — G8110 Spastic hemiplegia affecting unspecified side: Secondary | ICD-10-CM | POA: Insufficient documentation

## 2012-06-14 DIAGNOSIS — I619 Nontraumatic intracerebral hemorrhage, unspecified: Secondary | ICD-10-CM | POA: Insufficient documentation

## 2012-06-14 DIAGNOSIS — M76899 Other specified enthesopathies of unspecified lower limb, excluding foot: Secondary | ICD-10-CM | POA: Insufficient documentation

## 2012-06-14 DIAGNOSIS — I10 Essential (primary) hypertension: Secondary | ICD-10-CM | POA: Insufficient documentation

## 2012-06-14 DIAGNOSIS — R1310 Dysphagia, unspecified: Secondary | ICD-10-CM | POA: Insufficient documentation

## 2012-06-16 ENCOUNTER — Ambulatory Visit: Payer: BC Managed Care – PPO | Admitting: Occupational Therapy

## 2012-06-16 ENCOUNTER — Ambulatory Visit: Payer: BC Managed Care – PPO | Attending: Neurology | Admitting: Physical Therapy

## 2012-06-16 DIAGNOSIS — IMO0001 Reserved for inherently not codable concepts without codable children: Secondary | ICD-10-CM | POA: Insufficient documentation

## 2012-06-16 DIAGNOSIS — M6281 Muscle weakness (generalized): Secondary | ICD-10-CM | POA: Insufficient documentation

## 2012-06-16 DIAGNOSIS — R269 Unspecified abnormalities of gait and mobility: Secondary | ICD-10-CM | POA: Insufficient documentation

## 2012-06-19 ENCOUNTER — Ambulatory Visit: Payer: BC Managed Care – PPO | Admitting: Physical Therapy

## 2012-06-20 ENCOUNTER — Encounter: Payer: BC Managed Care – PPO | Admitting: Occupational Therapy

## 2012-06-20 ENCOUNTER — Ambulatory Visit: Payer: BC Managed Care – PPO | Admitting: Radiation Oncology

## 2012-06-20 ENCOUNTER — Ambulatory Visit: Payer: BC Managed Care – PPO | Admitting: Physical Therapy

## 2012-06-21 ENCOUNTER — Ambulatory Visit: Payer: BC Managed Care – PPO | Admitting: Occupational Therapy

## 2012-06-22 ENCOUNTER — Ambulatory Visit: Payer: BC Managed Care – PPO | Admitting: Occupational Therapy

## 2012-06-22 ENCOUNTER — Ambulatory Visit: Payer: BC Managed Care – PPO | Admitting: Physical Therapy

## 2012-06-27 ENCOUNTER — Ambulatory Visit: Payer: BC Managed Care – PPO | Admitting: Occupational Therapy

## 2012-06-27 ENCOUNTER — Ambulatory Visit: Payer: BC Managed Care – PPO | Admitting: Physical Therapy

## 2012-06-29 ENCOUNTER — Ambulatory Visit: Payer: BC Managed Care – PPO | Admitting: Physical Therapy

## 2012-06-29 ENCOUNTER — Ambulatory Visit: Payer: BC Managed Care – PPO | Admitting: Occupational Therapy

## 2012-07-04 ENCOUNTER — Ambulatory Visit: Payer: BC Managed Care – PPO | Admitting: Physical Therapy

## 2012-07-04 ENCOUNTER — Ambulatory Visit: Payer: BC Managed Care – PPO | Admitting: Occupational Therapy

## 2012-07-06 ENCOUNTER — Ambulatory Visit: Payer: BC Managed Care – PPO | Admitting: Physical Therapy

## 2012-07-06 ENCOUNTER — Ambulatory Visit: Payer: BC Managed Care – PPO | Admitting: Occupational Therapy

## 2012-07-07 ENCOUNTER — Encounter: Payer: BC Managed Care – PPO | Admitting: Physical Medicine & Rehabilitation

## 2012-07-11 ENCOUNTER — Encounter: Payer: BC Managed Care – PPO | Admitting: Occupational Therapy

## 2012-07-11 ENCOUNTER — Ambulatory Visit: Payer: BC Managed Care – PPO | Admitting: Physical Therapy

## 2012-07-11 ENCOUNTER — Ambulatory Visit: Payer: BC Managed Care – PPO | Admitting: Radiation Oncology

## 2012-07-13 ENCOUNTER — Ambulatory Visit: Payer: BC Managed Care – PPO | Admitting: Occupational Therapy

## 2012-07-13 ENCOUNTER — Ambulatory Visit: Payer: BC Managed Care – PPO | Admitting: Physical Therapy

## 2012-07-14 ENCOUNTER — Ambulatory Visit: Payer: BC Managed Care – PPO | Admitting: Occupational Therapy

## 2012-07-17 ENCOUNTER — Encounter
Payer: BC Managed Care – PPO | Attending: Physical Medicine & Rehabilitation | Admitting: Physical Medicine & Rehabilitation

## 2012-07-17 ENCOUNTER — Encounter: Payer: Self-pay | Admitting: Physical Medicine & Rehabilitation

## 2012-07-17 VITALS — BP 130/66 | HR 75 | Resp 14 | Ht 70.0 in | Wt 260.0 lb

## 2012-07-17 DIAGNOSIS — R1310 Dysphagia, unspecified: Secondary | ICD-10-CM | POA: Insufficient documentation

## 2012-07-17 DIAGNOSIS — I10 Essential (primary) hypertension: Secondary | ICD-10-CM

## 2012-07-17 DIAGNOSIS — M76899 Other specified enthesopathies of unspecified lower limb, excluding foot: Secondary | ICD-10-CM | POA: Insufficient documentation

## 2012-07-17 DIAGNOSIS — G8110 Spastic hemiplegia affecting unspecified side: Secondary | ICD-10-CM | POA: Insufficient documentation

## 2012-07-17 DIAGNOSIS — M7062 Trochanteric bursitis, left hip: Secondary | ICD-10-CM

## 2012-07-17 DIAGNOSIS — I619 Nontraumatic intracerebral hemorrhage, unspecified: Secondary | ICD-10-CM | POA: Insufficient documentation

## 2012-07-17 NOTE — Progress Notes (Signed)
Subjective:    Patient ID: Jeremy [name], male    DOB: 29-Mar-1965, 47 y.o.   MRN: [id number]  HPI  Jeremy [name] is back regarding his ICH and associated functional deficits. His hip pain has essentially resolved. He is still working with PT to address mobility. He is waiting on delivery of a left AFO to assist him with gait. He is fairly independent within the house for basic transfers and self-care. His wife is there for supervision. He is only walking with therapy.  He has noticed some occasional difficulties with swallowing of solid foods, in particular meats. He has not seen a dentist yet.  Spasms are controlled and BP is improved.   Pain Inventory Average Pain 0 Pain Right Now 0 My pain is tingling  In the last 24 hours, has pain interfered with the following? General activity 0 Relation with others 0 Enjoyment of life 0 What TIME of day is your pain at its worst? night Sleep (in general) Fair  Pain is worse with: some activites Pain improves with: pacing activities Relief from Meds: 8  Mobility use a walker use a wheelchair  Function I need assistance with the following:  dressing, bathing and household duties Do you have any goals in this area?  yes  Neuro/Psych confusion  Prior Studies Any changes since last visit?  no  Physicians involved in your care Any changes since last visit?  no   Family History  Problem Relation Age of Onset  . Hypertension Mother   . Heart disease Father   . Gout Father   . Hypertension Father    History   Social History  . Marital Status: Married    Spouse Name: N/A    Number of Children: N/A  . Years of Education: N/A   Social History Main Topics  . Smoking status: Never Smoker   . Smokeless tobacco: None  . Alcohol Use: No  . Drug Use: No  . Sexually Active: None   Other Topics Concern  . None   Social History Narrative  . None   Past Surgical History  Procedure Laterality Date  . Tracheostomy tube placement   10/11/2011    Procedure: TRACHEOSTOMY;  Surgeon: Christia Reading, MD;  Location: [facility] OR;  Service: ENT;  Laterality: N/A;   Past Medical History  Diagnosis Date  . Hypertension 10/01/2011    basal ganglia  . Stroke    BP 130/66  Pulse 75  Resp 14  Ht 5\' 10"  (1.778 m)  Wt 260 lb (117.935 kg)  BMI 37.31 kg/m2  SpO2 98%     Review of Systems  Psychiatric/Behavioral: Positive for confusion.  All other systems reviewed and are negative.       Objective:   Physical Exam General: Alert and oriented x 3, No apparent distress, overweight  HEENT: Head is normocephalic, atraumatic, PERRLA, EOMI, exopthalmos, sclera anicteric, oral mucosa pink and moist, dentition poor, ext ear canals clear,  Neck: Supple without JVD or lymphadenopathy  Heart: Reg rate and rhythm. No murmurs rubs or gallops  Chest: CTA bilaterally without wheezes, rales, or rhonchi; no distress  Abdomen: Soft, non-tender, non-distended, bowel sounds positive. Tube site healed. Extremities: No clubbing, cyanosis, or edema. Pulses are 2+  Skin: Clean and intact without signs of breakdown  Neuro: Pt is cognitively appropriate with normal insight, memory, and awareness.speech volume improved, minimal dysarthria. Cranial nerves 2-12 are notable for better tracking and conjugate gaze, mild left facial weakness... Sensory exam is 0-1/2 on the left.  Reflexes are 3+ on the left. Minimal resting tone. .. 5/5 motor on the right. LUE is 1- 1+/5. LLE is 2 to 3+/5. Marland Kitchen  Musculoskeletal: Full ROM, . Minimal pain with ER, IR, and flexion of the the left hip.  Psych: Pt's affect is appropriate. Pt is cooperative   Assessment & Plan:   ASSESSMENT: 1. Right BG/thalamic hemorrhage- he is making gradual progress  2. HTN  3. Dysphagia (resolved) with G-tube.  4. Spastic left hemiparesis.  5. Mild left greater trochanter bursitis--improved     Plan:  1. Discusses proper chewing and swallowing as well as appropriate food choices.  2.  Recommended dental follow up for his teeth. Several are decayed and are continuing to present an active risk for infection.  4. Maintain baclofen for spasticity management for now. Waiting on an AFO for his left LE. 5. Blood pressure control with lopressor, HCTZ --improved 6. He may continue tramadol for breakthrough pain, although he is rarely using at present.  7. Follow up here in about 6 months.

## 2012-07-17 NOTE — Patient Instructions (Signed)
CALL ME WITH ANY PROBLEMS OR QUESTIONS (#[phone]).  HAVE A GOOD DAY  

## 2012-07-18 ENCOUNTER — Encounter: Payer: BC Managed Care – PPO | Admitting: Occupational Therapy

## 2012-07-18 ENCOUNTER — Ambulatory Visit: Payer: BC Managed Care – PPO | Admitting: Physical Therapy

## 2012-07-20 ENCOUNTER — Ambulatory Visit: Payer: BC Managed Care – PPO | Admitting: Physical Therapy

## 2012-07-20 ENCOUNTER — Encounter: Payer: BC Managed Care – PPO | Admitting: Occupational Therapy

## 2012-07-25 ENCOUNTER — Telehealth: Payer: Self-pay | Admitting: *Deleted

## 2012-07-25 NOTE — Telephone Encounter (Signed)
Call from Case Manager, RN with [facility]  # [phone] ex [phone] Nurse states Pt needs physical therapy and PT will not work with pt until he gets a knee brace They need a signed order for a "KAFO Brace"   Fax to [facility] [phone]  Hx: CVA  Will you write for this?  I will Fax in for you.

## 2012-07-27 ENCOUNTER — Other Ambulatory Visit: Payer: Self-pay | Admitting: Radiation Oncology

## 2012-07-27 ENCOUNTER — Telehealth: Payer: Self-pay | Admitting: *Deleted

## 2012-07-27 DIAGNOSIS — G8190 Hemiplegia, unspecified affecting unspecified side: Secondary | ICD-10-CM

## 2012-07-27 NOTE — Telephone Encounter (Signed)
No problem--order has been placed. Please let me know if anything else is needed.

## 2012-07-27 NOTE — Telephone Encounter (Signed)
Nurse rep from insurance co calls and states pt needs a script sent to [facility] for a KAFO BRACE, needs to be done as a script and faxed to [phone] attn: Amil Amen ph# [phone]. Nurse from ins co: Karen [phone] ext [phone]. This was ask for by PT and PT will not continue unless pt has this. She is ask to have pt schedule an appt w/ pcp asap and to see deborah h. For financial assist. She is agreeable to this

## 2012-08-01 ENCOUNTER — Encounter: Payer: Self-pay | Admitting: Radiation Oncology

## 2012-08-01 ENCOUNTER — Ambulatory Visit (INDEPENDENT_AMBULATORY_CARE_PROVIDER_SITE_OTHER): Payer: BC Managed Care – PPO | Admitting: Radiation Oncology

## 2012-08-01 VITALS — BP 120/81 | HR 78 | Temp 98.5°F

## 2012-08-01 DIAGNOSIS — E785 Hyperlipidemia, unspecified: Secondary | ICD-10-CM

## 2012-08-01 DIAGNOSIS — I619 Nontraumatic intracerebral hemorrhage, unspecified: Secondary | ICD-10-CM

## 2012-08-01 DIAGNOSIS — I69959 Hemiplegia and hemiparesis following unspecified cerebrovascular disease affecting unspecified side: Secondary | ICD-10-CM

## 2012-08-01 DIAGNOSIS — M76899 Other specified enthesopathies of unspecified lower limb, excluding foot: Secondary | ICD-10-CM

## 2012-08-01 DIAGNOSIS — I10 Essential (primary) hypertension: Secondary | ICD-10-CM

## 2012-08-01 DIAGNOSIS — M7062 Trochanteric bursitis, left hip: Secondary | ICD-10-CM

## 2012-08-01 DIAGNOSIS — G8110 Spastic hemiplegia affecting unspecified side: Secondary | ICD-10-CM

## 2012-08-01 MED ORDER — METOPROLOL TARTRATE 50 MG PO TABS: 50.0000 mg | ORAL_TABLET | Freq: Two times a day (BID) | ORAL | Status: DC

## 2012-08-01 MED ORDER — BACLOFEN 10 MG PO TABS: 10.0000 mg | ORAL_TABLET | Freq: Four times a day (QID) | ORAL | Status: DC

## 2012-08-01 MED ORDER — HYDROCHLOROTHIAZIDE 25 MG PO TABS: 25.0000 mg | ORAL_TABLET | Freq: Every day | ORAL | Status: DC

## 2012-08-01 NOTE — Assessment & Plan Note (Signed)
The patient's pain continues to improve, and he states that he only takes tramadol occasionally at this time. - cont tramadol PRN

## 2012-08-01 NOTE — Patient Instructions (Addendum)
Patient instructions:  Continue taking your medications as prescribed.   You will begin seeing a new PCP when you return for your next visit. It has been a pleasure caring for you.   Congratulations on your recent success with physical therapy and rehabilitation. Best of luck with your continued recovery!

## 2012-08-01 NOTE — Assessment & Plan Note (Signed)
BP Readings from Last 3 Encounters:  08/01/12 120/81  07/17/12 130/66  04/07/12 139/86    Lab Results  Component Value Date   NA 143 02/03/2012   K 4.2 02/03/2012   CREATININE 0.89 02/03/2012    Assessment: Blood pressure control: controlled Progress toward BP goal:  at goal Comments:  Plan: Medications:  continue current medications: HCTZ 25mg  daily, metoprolol 50mg  bid Educational resources provided:   Self management tools provided:   Other plans: The patient was provided a blood pressure log to routinely document the BPs that they regularly measure at home (which have been within normal limits, typically ~120/80). He is doing well on his current regimen. He denies any fatigue with physical therapy at this time, however if this becomes an issue to him it may be worth considering reducing his metoprolol dose so as not to interfere with his physical therapy goals.

## 2012-08-01 NOTE — Assessment & Plan Note (Signed)
Patient states that he continues to take baclofen regularly. As his symptoms have markedly improved, he would benefit from further discussion of this issue at time of followup with neurology later this month (previously was to be seen in early 2014 by neurology, however this appointment was apparently canceled). Patient has had tremendous success in improving his left-sided hemiplegia with continued physical therapy, and now has 3/5 strength in his LUE and LLE. He will begin work with a KAFO brace on his left lower extremity with ambulation exercises in the near future with physical therapy. - Continue physical therapy - Continue baclofen - Followup with neurology - Patient provided a written prescription for a KAFO brace (given as a backup despite the brace being previously ordered, in an effort to prevent unnecessary delays in therapy)

## 2012-08-01 NOTE — Assessment & Plan Note (Signed)
Will continue to hold off on statin therapy given his history of ICH, although this is a controversial issue. The patient will see his neurologist later this month, and the pros/cons of statin therapy can be discussed at that time.  Lab Results  Component Value Date   LDLCALC 190* 02/03/2012

## 2012-08-01 NOTE — Telephone Encounter (Signed)
No sure about the order that was written. Pt has appointment today.  Will resolve then.

## 2012-08-01 NOTE — Progress Notes (Signed)
Subjective:    Patient ID: Jeremy [name], male    DOB: May 21, 1965, 47 y.o.   MRN: [id number]  HPI Patient presents to clinic today for routine followup visit. Since he was seen last in the [facility] in 02/2012, he has been doing very well, and has further regained function in both his left upper extremity and left lower extremity through continued work with physical therapy. He denies any complaints today. The patient and his wife monitor his blood pressure regularly at home, and report that it has been within normal limits on all measurements since he was last seen.  Review of Systems  All other systems reviewed and are negative.   Current Outpatient Medications: Current Outpatient Prescriptions  Medication Sig Dispense Refill  . baclofen (LIORESAL) 10 MG tablet Take 1 tablet (10 mg total) by mouth 4 (four) times daily.  120 each  5  . hydrochlorothiazide (HYDRODIURIL) 25 MG tablet Take 1 tablet (25 mg total) by mouth daily.  30 tablet  5  . metoprolol (LOPRESSOR) 50 MG tablet Take 1 tablet (50 mg total) by mouth 2 (two) times daily.  60 tablet  5  . traMADol (ULTRAM) 50 MG tablet Take 1 tablet (50 mg total) by mouth every 6 (six) hours as needed for pain.  90 tablet  3   No current facility-administered medications for this visit.    Allergies: No Known Allergies   Past Medical History: Past Medical History  Diagnosis Date  . Hypertension 10/01/2011    basal ganglia  . Stroke     Past Surgical History: Past Surgical History  Procedure Laterality Date  . Tracheostomy tube placement  10/11/2011    Procedure: TRACHEOSTOMY;  Surgeon: Christia Reading, MD;  Location: [facility] OR;  Service: ENT;  Laterality: N/A;    Family History: Family History  Problem Relation Age of Onset  . Hypertension Mother   . Heart disease Father   . Gout Father   . Hypertension Father     Social History: History   Social History  . Marital Status: Married    Spouse Name: N/A    Number of Children: N/A  .  Years of Education: N/A   Occupational History  . Not on file.   Social History Main Topics  . Smoking status: Never Smoker   . Smokeless tobacco: Not on file  . Alcohol Use: No  . Drug Use: No  . Sexually Active: Not on file   Other Topics Concern  . Not on file   Social History Narrative  . No narrative on file     Vital Signs: Blood pressure 120/81, pulse 78, temperature 98.5 F (36.9 C), temperature source Oral, height 0' (0 m), weight 0 lb (0 kg), SpO2 94.00%.       Objective:   Physical Exam  Constitutional: He is oriented to person, place, and time. He appears well-developed and well-nourished. No distress.  Sitting comfortably in wheelchair.   HENT:  Head: Normocephalic and atraumatic.  Eyes: Conjunctivae are normal. Pupils are equal, round, and reactive to light. No scleral icterus.  Neck: Normal range of motion. Neck supple. No tracheal deviation present.  Cardiovascular: Normal rate and regular rhythm.   No murmur heard. Pulmonary/Chest: Effort normal. He has no wheezes. He has no rales.  Abdominal: Soft. Bowel sounds are normal. He exhibits no distension. There is no tenderness.  Musculoskeletal: Normal range of motion. He exhibits no edema and no tenderness.  Neurological: He is alert and oriented to person, place,  and time. No cranial nerve deficit.  3/5 strength in LUE, LLE; 5/5 strength in RUE, RLE          Assessment & Plan:

## 2012-08-02 NOTE — Progress Notes (Signed)
Case discussed with Dr. McTyre immediately after the resident saw the patient. We reviewed the resident's history and exam and pertinent patient test results. I agree with the assessment, diagnosis and plan of care documented in the resident's note. 

## 2012-08-17 ENCOUNTER — Telehealth: Payer: Self-pay | Admitting: *Deleted

## 2012-08-17 NOTE — Telephone Encounter (Signed)
Call from [place], RN with [facility] [phone] Nurse got a referral to help pt with bath. S/p CVA Pt has Medicaid and will qualify for PCS aid, if you will sign for this. Wife is Jeremy [name], # [phone]  Will forward to [place], CSW

## 2012-08-22 ENCOUNTER — Telehealth: Payer: Self-pay | Admitting: Licensed Clinical Social Worker

## 2012-08-22 NOTE — Telephone Encounter (Signed)
Dr. Criselda Peaches was the attending during pt's most recent [facility] visit.  CSW will request Dr. Criselda Peaches to assist with required forms.  Thank you.

## 2012-08-22 NOTE — Telephone Encounter (Signed)
CSW received message from pt's spouse requesting assistance with pt's ADL's, specifically bathing.  Pt's most recent [facility] visit was in June 2014, CSW will request Attending to assist for [facility] request as to not delay.  Spouse also requesting handicap placard.  CSW informed Mrs. Beale to allow physician 2 weeks for completion of paperwork.  Spouse in agreement to have CSW mail handicap placard application if physician completes.

## 2012-09-14 ENCOUNTER — Ambulatory Visit (INDEPENDENT_AMBULATORY_CARE_PROVIDER_SITE_OTHER): Payer: BC Managed Care – PPO | Admitting: Nurse Practitioner

## 2012-09-14 ENCOUNTER — Encounter: Payer: Self-pay | Admitting: Nurse Practitioner

## 2012-09-14 VITALS — BP 120/84 | HR 76

## 2012-09-14 DIAGNOSIS — I619 Nontraumatic intracerebral hemorrhage, unspecified: Secondary | ICD-10-CM

## 2012-09-14 DIAGNOSIS — I10 Essential (primary) hypertension: Secondary | ICD-10-CM

## 2012-09-14 DIAGNOSIS — E785 Hyperlipidemia, unspecified: Secondary | ICD-10-CM

## 2012-09-14 NOTE — Progress Notes (Signed)
[facility]  PATIENT: Jeremy [name] DOB: 14-Dec-1965   HISTORY FROM: patient, wife, chart REASON FOR VISIT: stroke follow up   HISTORICAL  CHIEF COMPLAINT:  Chief Complaint  Patient presents with  . Follow-up    stroke    HISTORY OF PRESENT ILLNESS: [name] is a 47 year old male with history of hypertension not treated times years. He was admitted on October 01, 2011, with headache nausea, vomiting, and difficulty using left hand. CT of head done revealed right basal ganglia hemorrhage with surrounding edema. He was started on nicardipine drip for blood pressure control. He did develop somnolence due to extension of hemorrhage to right thalamus and posterior third ventricle with concerns for developing obstructive hydrocephalus. Intraventricular cath was placed by Dr. Jordan Likes. He did require replacement of catheter due to malfunction on August 21,2013. He had difficulty weaning of the vent and was trached on October 11, 2011, and gastrostomy tube was placed by Interventional Radiology on October 13, 2011. The patient has had an episode of projectile vomiting. On October 20, 2011, KUB done showed no evidence of obstruction. Rate was adjusted with resolution of symptoms. His trach has been downsized to cuffless Shiley 6 done and  regular diet initiated. The patient continued to be impaired due to dense left hemiparesis and sensory deficits, left neglect aphonia with cognitive deficits, as well as visual deficits in upper fields.   UPDATE 09/14/12 (LL):  Patient comes to office for first time since ICH almost 1 year ago.  He is accompanied by his spouse.  He has made slow gradual progress.  Able to eat regular diet, still has dense left hemiparesis and sensory deficits, left neglect, with cognitive deficits.  He has completed all of the therapy sessions that insurance would allow.  He is not able to walk, despite having AFO brace for LLE and decent strength.  Has been  taking medications as prescribed, with close monitoring from spouse.  BP is under good control.    REVIEW OF SYSTEMS: Full 14 system review of systems performed and notable only for:  Constitutional: N/A  Cardiovascular: N/A  Ear/Nose/Throat: N/A  Skin: N/A  Eyes: N/A  Respiratory: N/A  Gastroitestinal: N/A  Hematology/Lymphatic: N/A  Endocrine: N/A Musculoskeletal: aching muscles Allergy/Immunology: N/A  Neurological: N/A Psychiatric: N/A   ALLERGIES: No Known Allergies  HOME MEDICATIONS: Outpatient Prescriptions Prior to Visit  Medication Sig Dispense Refill  . hydrochlorothiazide (HYDRODIURIL) 25 MG tablet Take 1 tablet (25 mg total) by mouth daily.  30 tablet  5  . metoprolol (LOPRESSOR) 50 MG tablet Take 1 tablet (50 mg total) by mouth 2 (two) times daily.  60 tablet  5  . traMADol (ULTRAM) 50 MG tablet Take 1 tablet (50 mg total) by mouth every 6 (six) hours as needed for pain.  90 tablet  3  . baclofen (LIORESAL) 10 MG tablet Take 1 tablet (10 mg total) by mouth 4 (four) times daily.  120 each  5   No facility-administered medications prior to visit.    PAST MEDICAL HISTORY: Past Medical History  Diagnosis Date  . Hypertension 10/01/2011    basal ganglia  . Stroke   . High cholesterol     PAST SURGICAL HISTORY: Past Surgical History  Procedure Laterality Date  . Tracheostomy tube placement  10/11/2011    Procedure: TRACHEOSTOMY;  Surgeon: Christia Reading, MD;  Location: [facility];  Service: ENT;  Laterality: N/A;    FAMILY HISTORY: Family History  Problem Relation Age of  Onset  . Hypertension Mother   . Heart disease Father   . Gout Father   . Hypertension Father     SOCIAL HISTORY: History   Social History  . Marital Status: Married    Spouse Name: N/A    Number of Children: N/A  . Years of Education: N/A   Occupational History  . Not on file.   Social History Main Topics  . Smoking status: Never Smoker   . Smokeless tobacco: Not on file  .  Alcohol Use: No  . Drug Use: No  . Sexually Active: Not on file   Other Topics Concern  . Not on file   Social History Narrative  . No narrative on file     PHYSICAL EXAM  Filed Vitals:   09/14/12 1524  BP: 120/84  Pulse: 76   Cannot calculate BMI with a height equal to zero.  Generalized: In no acute distress, obese AA male in wheelchair  Neck: Supple, no carotid bruits   Cardiac: Regular rate rhythm, no murmur   Pulmonary: Clear to auscultation bilaterally   Musculoskeletal: No deformity   Neurological examination   Mentation: Alert oriented to time, place, history taking, language fluent, soft voice  Cranial nerve II-XII: Pupils were equal round reactive to light extraocular movements were full, visual field were full on confrontational test. facial sensation decreased on left, strength normal. hearing was intact to finger rubbing bilaterally. Uvula tongue midline. head turning and shoulder shrug and were normal and symmetric.Tongue protrusion into cheek strength was normal. MOTOR: 5/5 motor on the right. LUE is 1- 1+/5. LLE is 2 to 3+/5.  SENSORY: Sensory exam is 0-1/2 on the left. Normal on the right. COORDINATION:  FTF slow on left, but able.  Normal on right. REFLEXES: Reflexes are 3+ on the left. 2+ right GAIT/STATION: unable.   DIAGNOSTIC DATA (LABS, IMAGING, TESTING) - I reviewed patient records, labs, notes, testing and imaging myself where available.  Lab Results  Component Value Date   WBC 5.6 10/25/2011   HGB 10.2* 10/25/2011   HCT 32.0* 10/25/2011   MCV 89.9 10/25/2011   PLT 410* 10/25/2011      Component Value Date/Time   NA 143 02/03/2012 1429   K 4.2 02/03/2012 1429   CL 106 02/03/2012 1429   CO2 28 02/03/2012 1429   GLUCOSE 91 02/03/2012 1429   BUN 14 02/03/2012 1429   CREATININE 0.89 02/03/2012 1429   CREATININE 0.84 10/25/2011 1115   CALCIUM 10.2 02/03/2012 1429   PROT 8.4* 02/03/2012 1429   ALBUMIN 4.0 02/03/2012 1429   AST 16 02/03/2012  1429   ALT 13 02/03/2012 1429   ALKPHOS 88 02/03/2012 1429   BILITOT 0.5 02/03/2012 1429   GFRNONAA >90 10/25/2011 1115   GFRAA >90 10/25/2011 1115   Lab Results  Component Value Date   CHOL 254* 02/03/2012   HDL 37* 02/03/2012   LDLCALC 190* 02/03/2012   TRIG 135 02/03/2012   CHOLHDL 6.9 02/03/2012    Lab Results  Component Value Date   HGBA1C 5.6 10/02/2011   Lab Results  Component Value Date   VITAMINB12 687 10/01/2011   Lab Results  Component Value Date   TSH 1.837 10/01/2011   CT head  10/18/2011 Little change in the size of the right thalamic hemorrhage with surrounding edema and minimal mass effect.  10/15/2011 Stable right thalamic acute to subacute hemorrhage measuring 1.4 x 3 cm with mild adjacent edema and mass effect. Interval removal of a right  frontal ventriculostomy catheter with stable minimal hemorrhage along the ventriculostomy tract in the right frontal lobe. The 6 mm dense focus along the tract just inside the frontal skull which may represent foreign body.  10/13/2011 Mild regression and overall expected evolution of right thalamic hemorrhage. Stable mild edema and mass effect. Right frontal approach EVD remains in place. Stable to slightly increased blood products along the parenchymal course of the shunt. IVH has resolved. Stable ventricle size and configuration. No new intracranial abnormality  Dg Chest Port 1 View  10/15/2011 Stable portable chest exam  10/12/2011 1. Interval placement of tracheostomy, appropriately positioned without pneumothorax. 2. Low lung volumes. Patchy right greater than left airspace disease which is favored to represent atelectasis. 3. Cardiomegaly without congestive failure.  ASSESSMENT AND PLAN 47 y.o. year old male  has a past medical history of Hypertension and High cholesterol here for follow up for ICH on (10/01/2011).  Has made slow, gradual recovery.  Trach removed, g-tube removed, back on regular diet.  Dense left hemiparesis and  sensory deficits, left neglect, with cognitive deficits. Has AFO for his left LE, but unsure if using.    We will check cholesterol, needs to be fasting.   If elevated, we will start cholesterol medicine.  Goal Total Cholesterol <200,  LDL <100.  Keep strict control of BP, less than 130/90.  Follow up on 6 months.  Orders Placed This Encounter  Procedures  . Lipid Panel   Zurii Hewes NP-C 09/14/2012, 4:02 PM  [facility] [address], [address] [place], [place] [address] [phone]

## 2012-09-14 NOTE — Patient Instructions (Addendum)
We will check cholesterol, needs to be fasting.  Come back at your convenience.  If elevated, we will start cholesterol medicine. Follow up on 6 months.

## 2012-10-03 ENCOUNTER — Telehealth: Payer: Self-pay | Admitting: *Deleted

## 2012-10-03 NOTE — Telephone Encounter (Signed)
The wife is calling about husband form.

## 2012-10-05 NOTE — Telephone Encounter (Signed)
Wife call about husband form.

## 2012-10-06 ENCOUNTER — Telehealth: Payer: Self-pay | Admitting: Neurology

## 2012-10-09 NOTE — Telephone Encounter (Signed)
Spoke to wife and form filled out and faxed.

## 2012-10-12 ENCOUNTER — Encounter: Payer: BC Managed Care – PPO | Admitting: Internal Medicine

## 2012-10-19 ENCOUNTER — Ambulatory Visit (INDEPENDENT_AMBULATORY_CARE_PROVIDER_SITE_OTHER): Payer: BC Managed Care – PPO | Admitting: Internal Medicine

## 2012-10-19 ENCOUNTER — Encounter: Payer: Self-pay | Admitting: Internal Medicine

## 2012-10-19 VITALS — BP 128/84 | HR 77 | Temp 97.6°F

## 2012-10-19 DIAGNOSIS — L908 Other atrophic disorders of skin: Secondary | ICD-10-CM

## 2012-10-19 DIAGNOSIS — G8110 Spastic hemiplegia affecting unspecified side: Secondary | ICD-10-CM

## 2012-10-19 DIAGNOSIS — I619 Nontraumatic intracerebral hemorrhage, unspecified: Secondary | ICD-10-CM

## 2012-10-19 DIAGNOSIS — Z23 Encounter for immunization: Secondary | ICD-10-CM

## 2012-10-19 DIAGNOSIS — I10 Essential (primary) hypertension: Secondary | ICD-10-CM

## 2012-10-19 DIAGNOSIS — R238 Other skin changes: Secondary | ICD-10-CM | POA: Insufficient documentation

## 2012-10-19 DIAGNOSIS — E785 Hyperlipidemia, unspecified: Secondary | ICD-10-CM

## 2012-10-19 NOTE — Assessment & Plan Note (Signed)
Patient has 3/5 strength in LUE and LLE.  Patient's wife told that PT benefit has finished.  Patient would likely benefit from additional PT.  Will refer to PT.

## 2012-10-19 NOTE — Assessment & Plan Note (Signed)
Patient has some well-healed area of skin breakdown abdominal crease underneath pannus. Wife has applied an extensive amount of powder to area. In addition there is a slight amount of skin breakdown in between his buttocks. No signs of infection. Encouraged wife to continue to keep the area clean and dry. She can also continue to use baby powder and aloe vesta cream for the area. If it does not improve she was encouraged to make another appointment to come back.

## 2012-10-19 NOTE — Assessment & Plan Note (Signed)
Recheck Lipid Panel per neurology recommendation.

## 2012-10-19 NOTE — Assessment & Plan Note (Addendum)
BP Readings from Last 3 Encounters:  10/19/12 128/84  09/14/12 120/84  08/01/12 120/81    Lab Results  Component Value Date   NA 143 02/03/2012   K 4.2 02/03/2012   CREATININE 0.89 02/03/2012    Assessment: Blood pressure control: controlled Progress toward BP goal:  at goal Comments: at goal  Plan: Medications:  continue current medications  HCTZ 25mg  daily and metoprolol 50mg  BID Educational resources provided: brochure Self management tools provided: home blood pressure logbook Other plans: continue home monitoring daily.

## 2012-10-19 NOTE — Assessment & Plan Note (Addendum)
Patient has recently seen neurology who is pleased with his improvement and has set goal pressure to 130/90. And cholesterol goal of LDL less than 100. He has not yet had his lipid panel drawn. He is currently not fasting. He was encouraged to return to our clinic or the neurology clinic fasting to obtain lipid panel.

## 2012-10-19 NOTE — Patient Instructions (Addendum)
Continue taking your medications as prescribed. Keep skin folds clean and dry, you may continue to apply baby powder and Aloe Vesta to area. Come back to have your cholesterol checked on an empty stomach, I will call you if the numbers are high.

## 2012-10-19 NOTE — Progress Notes (Signed)
Patient ID: Jeremy [name], male   DOB: 1966-01-30, 47 y.o.   MRN: [id number]   Subjective:   Patient ID: Jeremy [name] male   DOB: [birth year] 47 y.o.   MRN: [id number]  HPI: [name] is a 47 y.o. male with past medical history of intracerebral hemorrhage, hypertension, spastic hemiplegia, hyperlipidemia. He presents today accompanied by his wife who is concerned about some tissue breakdown at the crease below his belly and in between his buttocks. She reports that she has been caring for it as the nurses taught her when this happened before during his hospitalization and rehabilitation. She is placed baby powder and Aloe Vesta over the area. In addition he was starting scheduled for a visit next week for followup for blood pressure and cholesterol. Since his last visit he has seen neurology who is pleased with his progress and would like to repeat a lipid panel and start a medication if LDL above 100. The patient currently has no other complaints and reports he is doing very well on his medications. Patient's wife checks his blood pressure a few times a week and brought the log with her. Highest blood pressure and log was 125/87. Most pressures were below 120/80.    Past Medical History  Diagnosis Date  . Hypertension 10/01/2011    basal ganglia  . Stroke   . High cholesterol    Current Outpatient Prescriptions  Medication Sig Dispense Refill  . baclofen (LIORESAL) 10 MG tablet Take 10 mg by mouth 4 (four) times daily.      . hydrochlorothiazide (HYDRODIURIL) 25 MG tablet Take 1 tablet (25 mg total) by mouth daily.  30 tablet  5  . metoprolol (LOPRESSOR) 50 MG tablet Take 1 tablet (50 mg total) by mouth 2 (two) times daily.  60 tablet  5  . prochlorperazine (COMPAZINE) 10 MG tablet Take 10 mg by mouth every 6 (six) hours as needed.      . traMADol (ULTRAM) 50 MG tablet Take 1 tablet (50 mg total) by mouth every 6 (six) hours as needed for pain.  90 tablet  3   No current  facility-administered medications for this visit.   Family History  Problem Relation Age of Onset  . Hypertension Mother   . Heart disease Father   . Gout Father   . Hypertension Father    History   Social History  . Marital Status: Married    Spouse Name: N/A    Number of Children: N/A  . Years of Education: N/A   Social History Main Topics  . Smoking status: Never Smoker   . Smokeless tobacco: None  . Alcohol Use: No  . Drug Use: No  . Sexual Activity: None   Other Topics Concern  . None   Social History Narrative  . None   Review of Systems: Review of Systems  Constitutional: Negative for fever, chills, weight loss and malaise/fatigue.  HENT: Negative for congestion and tinnitus.   Eyes: Negative for blurred vision and pain.  Respiratory: Negative for cough and shortness of breath.   Cardiovascular: Negative for chest pain and leg swelling.  Gastrointestinal: Negative for heartburn, nausea, vomiting, abdominal pain, diarrhea and constipation.  Genitourinary: Negative for dysuria.  Musculoskeletal: Negative for falls.  Skin: Negative for itching.  Neurological: Positive for focal weakness (left side). Negative for dizziness and headaches.  Psychiatric/Behavioral: Negative for depression.    Objective:  Physical Exam: Filed Vitals:   10/19/12 1425  BP: 130/92  Pulse: 77  Temp:  97.6 F (36.4 C)  TempSrc: Oral  SpO2: 97%   Physical Exam  Nursing note and vitals reviewed. Constitutional: He is oriented to person, place, and time and well-developed, well-nourished, and in no distress. No distress.  In wheelchair  HENT:  Head: Normocephalic and atraumatic.  Eyes: Conjunctivae are normal. No scleral icterus.  Cardiovascular: Normal rate, regular rhythm, normal heart sounds and intact distal pulses.   No murmur heard. Pulmonary/Chest: Effort normal and breath sounds normal. No respiratory distress. He has no wheezes. He has no rales.  Abdominal: Soft. Bowel  sounds are normal. He exhibits no distension. There is no tenderness.  Musculoskeletal: He exhibits no edema.  3/5 Strength LLE and LUE. 5/5 Strength RLE and RUE.   Able to transfer from wheelchair to bed without assistance.  Neurological: He is alert and oriented to person, place, and time.  Skin: Skin is warm and dry. He is not diaphoretic.     Psychiatric: Affect and judgment normal.     Assessment & Plan:  See problem based assessment and plan.

## 2012-10-25 NOTE — Progress Notes (Signed)
I saw and evaluated the patient.  I personally confirmed the key portions of the history and exam documented by Dr. Hoffman and I reviewed pertinent patient test results.  The assessment, diagnosis, and plan were formulated together and I agree with the documentation in the resident's note. 

## 2012-10-26 ENCOUNTER — Encounter: Payer: BC Managed Care – PPO | Admitting: Internal Medicine

## 2012-10-27 ENCOUNTER — Ambulatory Visit: Payer: BC Managed Care – PPO

## 2012-10-31 ENCOUNTER — Encounter: Payer: BC Managed Care – PPO | Admitting: Internal Medicine

## 2012-11-17 ENCOUNTER — Telehealth: Payer: Self-pay | Admitting: Licensed Clinical Social Worker

## 2012-11-17 NOTE — Telephone Encounter (Signed)
Jeremy [name] was referred to CSW as pt has cancelled [facility] appt due to lack of transportation.  Pt has medicaid and would be eligible for Medicaid medical transportation.  CSW placed called to pt.  CSW left message requesting return call. CSW provided contact hours and phone number.

## 2012-11-20 NOTE — Telephone Encounter (Signed)
CSW received call back from pt's spouse.  Spouse provided cell phone number for contact.  CSW placed call to both home and cell phone numbers.  Left message at both informing pt and spouse transportation services are available for medical appt's.  Requested return call if pt would like assistance with scheduling medical transportation.

## 2012-11-21 ENCOUNTER — Encounter: Payer: Self-pay | Admitting: Licensed Clinical Social Worker

## 2012-11-21 NOTE — Telephone Encounter (Signed)
Met with pt and spouse on 11/20/12.  Spouse unaware of Medicaid transportation and in agreement for CSW to make referral to [facility]  Referral to [facility] completed on 11/21/12.

## 2012-11-30 ENCOUNTER — Encounter: Payer: BC Managed Care – PPO | Admitting: Internal Medicine

## 2013-01-05 ENCOUNTER — Other Ambulatory Visit: Payer: Self-pay | Admitting: *Deleted

## 2013-01-05 DIAGNOSIS — I10 Essential (primary) hypertension: Secondary | ICD-10-CM

## 2013-01-05 MED ORDER — HYDROCHLOROTHIAZIDE 25 MG PO TABS: 25.0000 mg | ORAL_TABLET | Freq: Every day | ORAL | Status: DC

## 2013-01-15 ENCOUNTER — Encounter
Payer: BC Managed Care – PPO | Attending: Physical Medicine & Rehabilitation | Admitting: Physical Medicine & Rehabilitation

## 2013-01-15 ENCOUNTER — Encounter: Payer: Self-pay | Admitting: Physical Medicine & Rehabilitation

## 2013-01-15 VITALS — BP 139/86 | HR 71 | Resp 16 | Ht 70.0 in | Wt 260.0 lb

## 2013-01-15 DIAGNOSIS — G8110 Spastic hemiplegia affecting unspecified side: Secondary | ICD-10-CM

## 2013-01-15 DIAGNOSIS — I619 Nontraumatic intracerebral hemorrhage, unspecified: Secondary | ICD-10-CM | POA: Insufficient documentation

## 2013-01-15 DIAGNOSIS — I10 Essential (primary) hypertension: Secondary | ICD-10-CM | POA: Insufficient documentation

## 2013-01-15 DIAGNOSIS — M7062 Trochanteric bursitis, left hip: Secondary | ICD-10-CM

## 2013-01-15 DIAGNOSIS — M76899 Other specified enthesopathies of unspecified lower limb, excluding foot: Secondary | ICD-10-CM

## 2013-01-15 DIAGNOSIS — R1310 Dysphagia, unspecified: Secondary | ICD-10-CM | POA: Insufficient documentation

## 2013-01-15 NOTE — Patient Instructions (Addendum)
I WOULD LIKE TO SEE YOU STANDING MORE [facility] DAY!!!!)   Trochanteric Bursitis You have hip pain due to trochanteric bursitis. Bursitis means that the sack near the outside of the hip is filled with fluid and inflamed. This sack is made up of protective soft tissue. The pain from trochanteric bursitis can be severe and keep you from sleep. It can radiate to the buttocks or down the outside of the thigh to the knee. The pain is almost always worse when rising from the seated or lying position and with walking. Pain can improve after you take a few steps. It happens more often in people with hip joint and lumbar spine problems, such as arthritis or previous surgery. Very rarely the trochanteric bursa can become infected, and antibiotics and/or surgery may be needed. Treatment often includes an injection of local anesthetic mixed with cortisone medicine. This medicine is injected into the area where it is most tender over the hip. Repeat injections may be necessary if the response to treatment is slow. You can apply ice packs over the tender area for 30 minutes every 2 hours for the next few days. Anti-inflammatory and/or narcotic pain medicine may also be helpful. Limit your activity for the next few days if the pain continues. See your caregiver in 5-10 days if you are not greatly improved.  SEEK IMMEDIATE MEDICAL CARE IF:  You develop severe pain, fever, or increased redness.  You have pain that radiates below the knee. EXERCISES STRETCHING EXERCISES - Trochantic Bursitis  These exercises may help you when beginning to rehabilitate your injury. Your symptoms may resolve with or without further involvement from your physician, physical therapist or athletic trainer. While completing these exercises, remember:   Restoring tissue flexibility helps normal motion to return to the joints. This allows healthier, less painful movement and activity.  An effective stretch should be held for at least 30  seconds.  A stretch should never be painful. You should only feel a gentle lengthening or release in the stretched tissue. STRETCH  Iliotibial Band  On the floor or bed, lie on your side so your injured leg is on top. Bend your knee and grab your ankle.  Slowly bring your knee back so that your thigh is in line with your trunk. Keep your heel at your buttocks and gently arch your back so your head, shoulders and hips line up.  Slowly lower your leg so that your knee approaches the floor/bed until you feel a gentle stretch on the outside of your thigh. If you do not feel a stretch and your knee will not fall farther, place the heel of your opposite foot on top of your knee and pull your thigh down farther.  Hold this stretch for __________ seconds.  Repeat __________ times. Complete this exercise __________ times per day. STRETCH Hamstrings, Supine   Lie on your back. Loop a belt or towel over the ball of your foot as shown.  Straighten your knee and slowly pull on the belt to raise your injured leg. Do not allow the knee to bend. Keep your opposite leg flat on the floor.  Raise the leg until you feel a gentle stretch behind your knee or thigh. Hold this position for __________ seconds.  Repeat __________ times. Complete this stretch __________ times per day. STRETCH - Quadriceps, Prone   Lie on your stomach on a firm surface, such as a bed or padded floor.  Bend your knee and grasp your ankle. If you are  unable to reach, your ankle or pant leg, use a belt around your foot to lengthen your reach.  Gently pull your heel toward your buttocks. Your knee should not slide out to the side. You should feel a stretch in the front of your thigh and/or knee.  Hold this position for __________ seconds.  Repeat __________ times. Complete this stretch __________ times per day. STRETCHING - Hip Flexors, Lunge Half kneel with your knee on the floor and your opposite knee bent and directly over  your ankle.  Keep good posture with your head over your shoulders. Tighten your buttocks to point your tailbone downward; this will prevent your back from arching too much.  You should feel a gentle stretch in the front of your thigh and/or hip. If you do not feel any resistance, slightly slide your opposite foot forward and then slowly lunge forward so your knee once again lines up over your ankle. Be sure your tailbone remains pointed downward.  Hold this stretch for __________ seconds.  Repeat __________ times. Complete this stretch __________ times per day. STRETCH - Adductors, Lunge  While standing, spread your legs  Lean away from your injured leg by bending your opposite knee. You may rest your hands on your thigh for balance.  You should feel a stretch in your inner thigh. Hold for __________ seconds.  Repeat __________ times. Complete this exercise __________ times per day. Document Released: 03/11/2004 Document Revised: 04/26/2011 Document Reviewed: 05/16/2008 [facility] Patient Information 2014 [place], [place].

## 2013-01-15 NOTE — Progress Notes (Addendum)
Subjective:    Patient ID: Jeremy [name], male    DOB: September 23, 1965, 47 y.o.   MRN: [id number]  HPI  Jeremy [name] is back regarding his right BG and thalamic hemorrhage. He has been doing fairly well this am. He is doing his exercises at home.   His left hip bothers him a bit. It seems to be more tender at night. His wife says he doesn't do his exercises some days because of hip. For the most part he has been diligent with his exercises.   Pain Inventory Average Pain 0 Pain Right Now 0 My pain is aching  In the last 24 hours, has pain interfered with the following? General activity 5 Relation with others 5 Enjoyment of life 5 What TIME of day is your pain at its worst? night Sleep (in general) Fair  Pain is worse with: some activites Pain improves with: therapy/exercise Relief from Meds: 0  Mobility ability to climb steps?  no do you drive?  no use a wheelchair  Function disabled: date disabled .  Neuro/Psych tingling  Prior Studies Any changes since last visit?  no  Physicians involved in your care Any changes since last visit?  no   Family History  Problem Relation Age of Onset  . Hypertension Mother   . Heart disease Father   . Gout Father   . Hypertension Father    History   Social History  . Marital Status: Married    Spouse Name: N/A    Number of Children: N/A  . Years of Education: N/A   Social History Main Topics  . Smoking status: Never Smoker   . Smokeless tobacco: None  . Alcohol Use: No  . Drug Use: No  . Sexual Activity: None   Other Topics Concern  . None   Social History Narrative  . None   Past Surgical History  Procedure Laterality Date  . Tracheostomy tube placement  10/11/2011    Procedure: TRACHEOSTOMY;  Surgeon: Christia Reading, MD;  Location: [facility] OR;  Service: ENT;  Laterality: N/A;   Past Medical History  Diagnosis Date  . Hypertension 10/01/2011    basal ganglia  . Stroke   . High cholesterol    BP 139/86  Pulse 71   Resp 16  Ht 5\' 10"  (1.778 m)  Wt 260 lb (117.935 kg)  BMI 37.31 kg/m2  SpO2 96%     Review of Systems  Genitourinary: Positive for difficulty urinating.  Musculoskeletal: Positive for arthralgias and myalgias.  All other systems reviewed and are negative.       Objective:   Physical Exam  General: Alert and oriented x 3, No apparent distress, overweight  HEENT: Head is normocephalic, atraumatic, PERRLA, EOMI, exopthalmos, sclera anicteric, oral mucosa pink and moist, dentition poor, ext ear canals clear,  Neck: Supple without JVD or lymphadenopathy  Heart: Reg rate and rhythm. No murmurs rubs or gallops  Chest: CTA bilaterally without wheezes, rales, or rhonchi; no distress  Abdomen: Soft, non-tender, non-distended, bowel sounds positive. Tube site healed.  Extremities: No clubbing, cyanosis, or edema. Pulses are 2+  Skin: Clean and intact without signs of breakdown  Neuro: Pt is cognitively appropriate with normal insight, memory, and awareness.speech volume improved, minimal dysarthria. Cranial nerves 2-12 are notable for better tracking and conjugate gaze, mild left facial weakness... Sensory exam is 0-1/2 on the left. Reflexes are 3+ on the left. Minimal resting tone. .. 5/5 motor on the right. LUE is 1- 1+/5. LLE is  2 to 3+/5. Marland Kitchen  Musculoskeletal: Full ROM, . Mildl pain with ER, IR, and flexion of the the left hip.  Psych: Pt's affect is appropriate. Pt is cooperative   Assessment & Plan:   ASSESSMENT: 1. Right BG/thalamic hemorrhage- he is making gradual progress  2. HTN  3. Dysphagia (resolved) with G-tube.  4. Spastic left hemiparesis.  5. Mild left greater trochanter bursitis--still has an occasional flare.   Plan:  1. Discusses proper chewing and swallowing as well as appropriate food choices.  2. Dental follow up this week for his teeth.   4. Maintain baclofen for spasticity management for now. He has received an AFO LLE 5. Blood pressure control with  lopressor, HCTZ --improved as a whole. 6. Ice and occasional tramadol for breakthrough pain. Provided greater troch exercises were provided.  7. Follow up here in about 6 months.

## 2013-01-25 ENCOUNTER — Encounter: Payer: BC Managed Care – PPO | Admitting: Internal Medicine

## 2013-01-26 ENCOUNTER — Other Ambulatory Visit: Payer: Self-pay | Admitting: *Deleted

## 2013-01-26 MED ORDER — BACLOFEN 10 MG PO TABS: 10.0000 mg | ORAL_TABLET | Freq: Four times a day (QID) | ORAL | Status: DC

## 2013-02-23 ENCOUNTER — Other Ambulatory Visit: Payer: Self-pay | Admitting: *Deleted

## 2013-02-23 MED ORDER — METOPROLOL TARTRATE 50 MG PO TABS: 50.0000 mg | ORAL_TABLET | Freq: Two times a day (BID) | ORAL | Status: DC

## 2013-02-26 ENCOUNTER — Ambulatory Visit: Payer: Self-pay | Admitting: Nurse Practitioner

## 2013-02-26 ENCOUNTER — Telehealth: Payer: Self-pay | Admitting: Nurse Practitioner

## 2013-02-26 NOTE — Telephone Encounter (Signed)
Patient was no show for office revisit today.

## 2013-03-13 IMAGING — CR DG CHEST 1V PORT
1 series · 1 of 1 positions shown · non-contrast
Comparison: Chest 10/15/2011.

CLINICAL DATA: Tracheostomy tube.

PORTABLE CHEST - 1 VIEW

[AP]
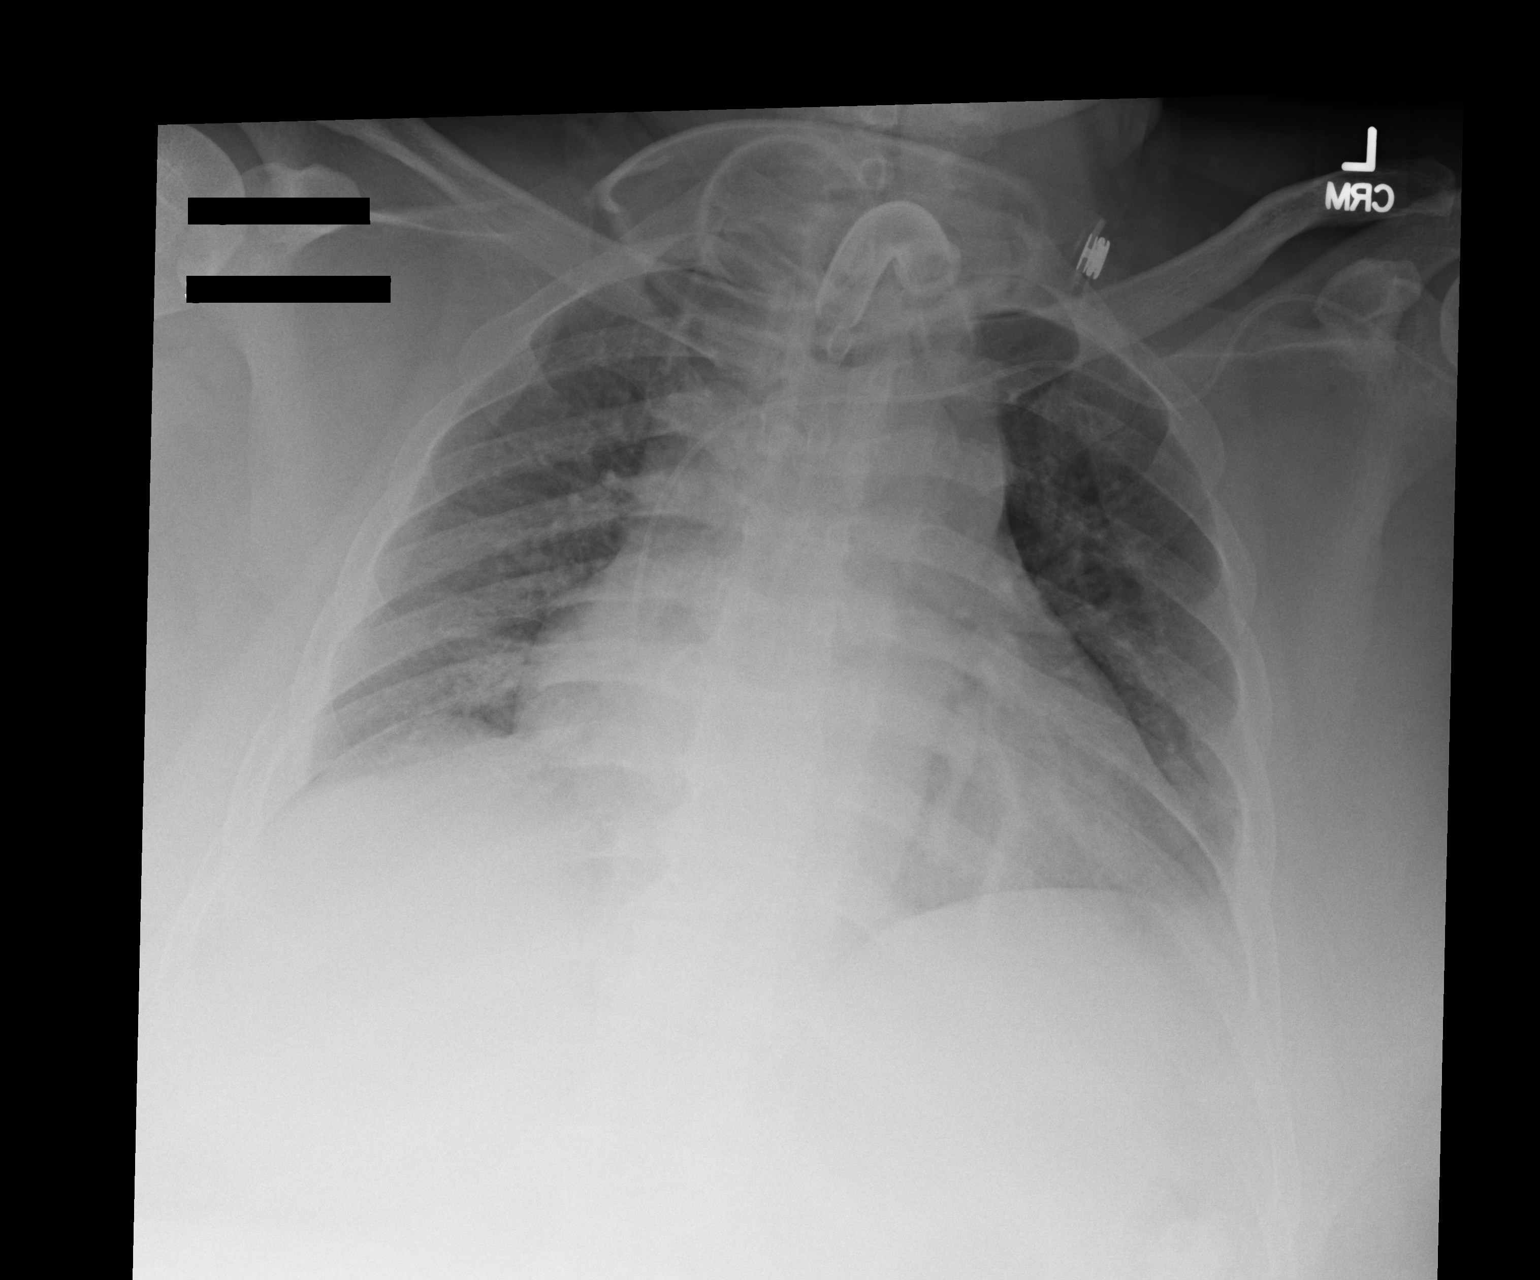

[1 of 1 positions shown; findings below may reference images not displayed]

FINDINGS: Tracheostomy tube is in place and appears in good
position.  Left IJ catheter has been removed.  There is a new left
PICC with the tip projecting over the superior cavoatrial junction.
Cardiomegaly is again seen.  No consolidative process, pneumothorax
or effusion.
IMPRESSION: 1.  Tip of left PICC projects over the superior cavoatrial
junction.  Tracheostomy tube is in good position.
2.  Cardiomegaly without acute disease.

## 2013-03-19 ENCOUNTER — Ambulatory Visit: Payer: BC Managed Care – PPO | Admitting: Nurse Practitioner

## 2013-03-21 ENCOUNTER — Ambulatory Visit: Payer: BC Managed Care – PPO | Admitting: Nurse Practitioner

## 2013-03-29 ENCOUNTER — Ambulatory Visit: Payer: BC Managed Care – PPO | Attending: Internal Medicine | Admitting: Physical Therapy

## 2013-03-29 DIAGNOSIS — I69959 Hemiplegia and hemiparesis following unspecified cerebrovascular disease affecting unspecified side: Secondary | ICD-10-CM | POA: Insufficient documentation

## 2013-03-29 DIAGNOSIS — R262 Difficulty in walking, not elsewhere classified: Secondary | ICD-10-CM | POA: Insufficient documentation

## 2013-03-29 DIAGNOSIS — IMO0001 Reserved for inherently not codable concepts without codable children: Secondary | ICD-10-CM | POA: Insufficient documentation

## 2013-03-31 IMAGING — CR DG HIP (WITH OR WITHOUT PELVIS) 2-3V*L*
4 series · 4 of 4 positions shown · non-contrast
Comparison: None.

CLINICAL DATA: Fall

LEFT HIP - COMPLETE 2+ VIEW

[t pelvis ap]
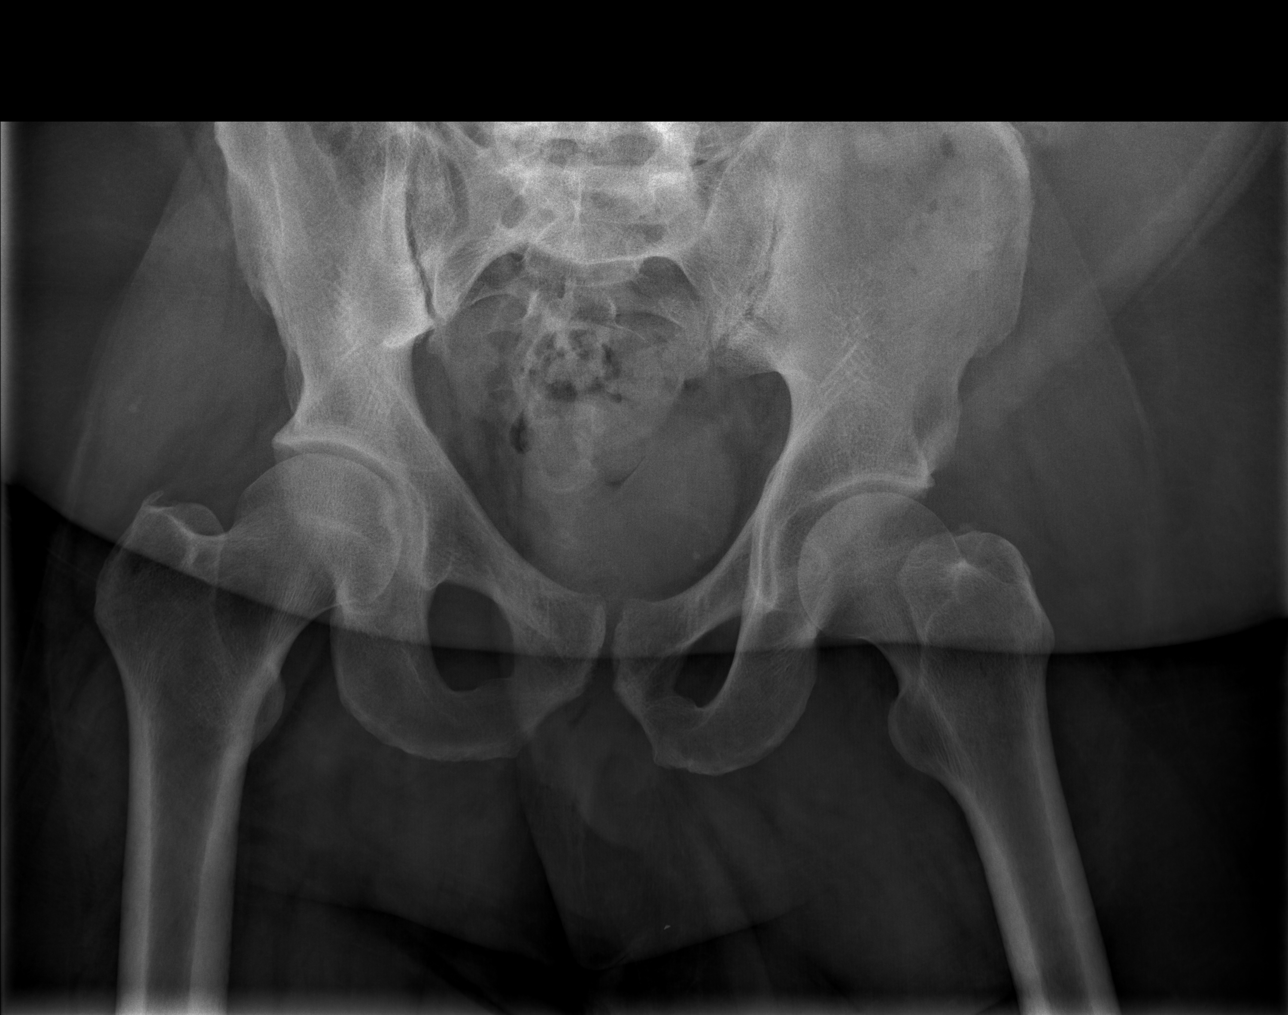

[t hip ap left]
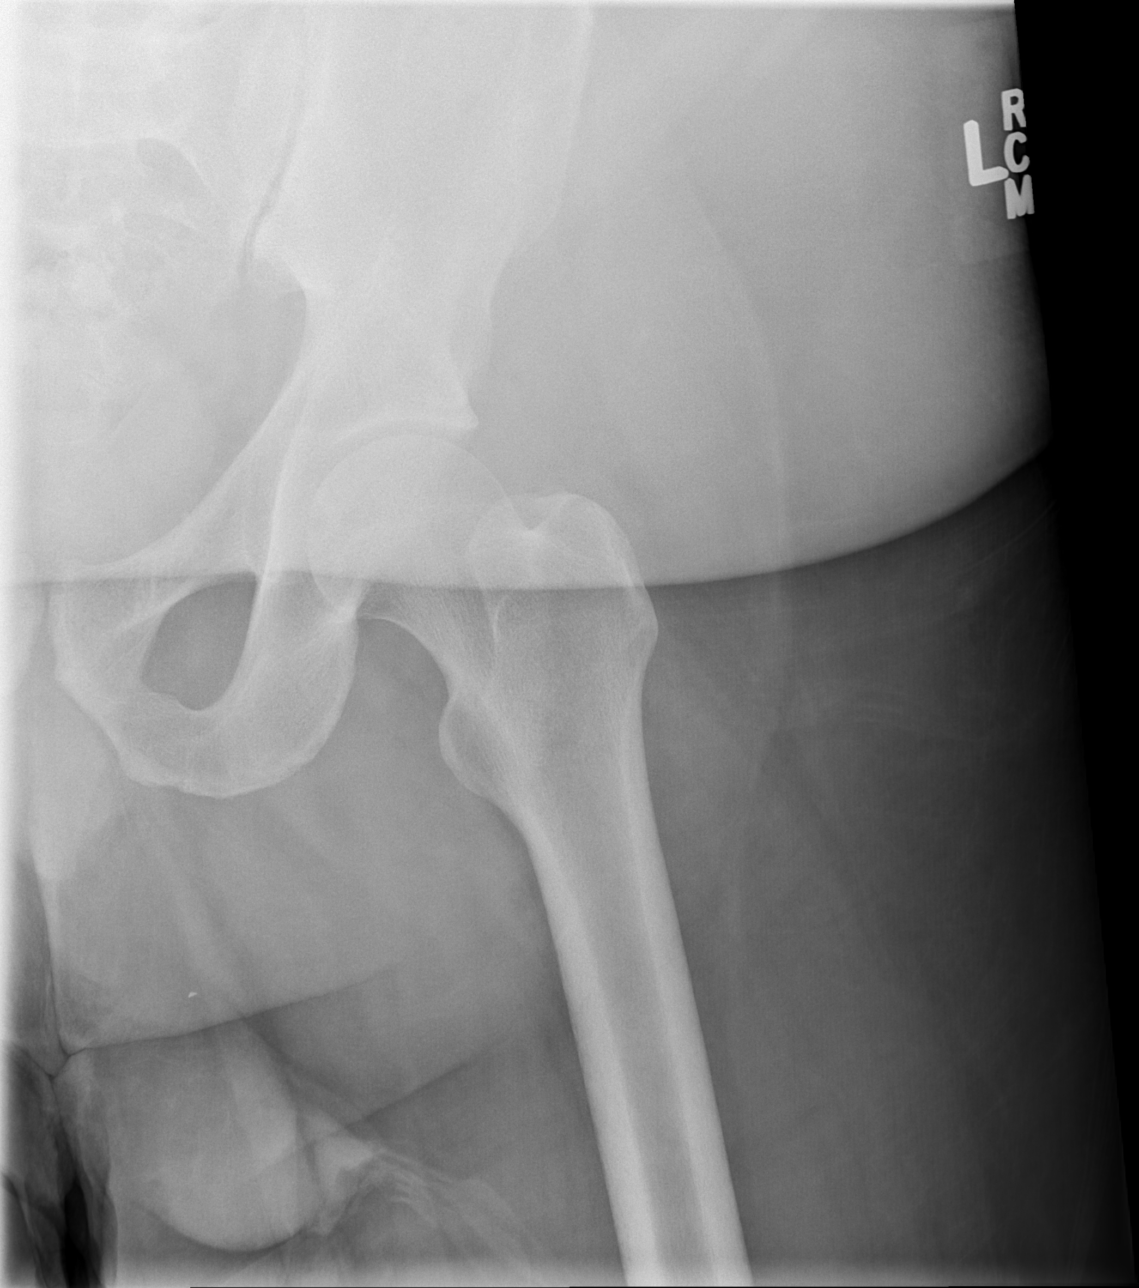

[t hip frog leg left (1 of 2)]
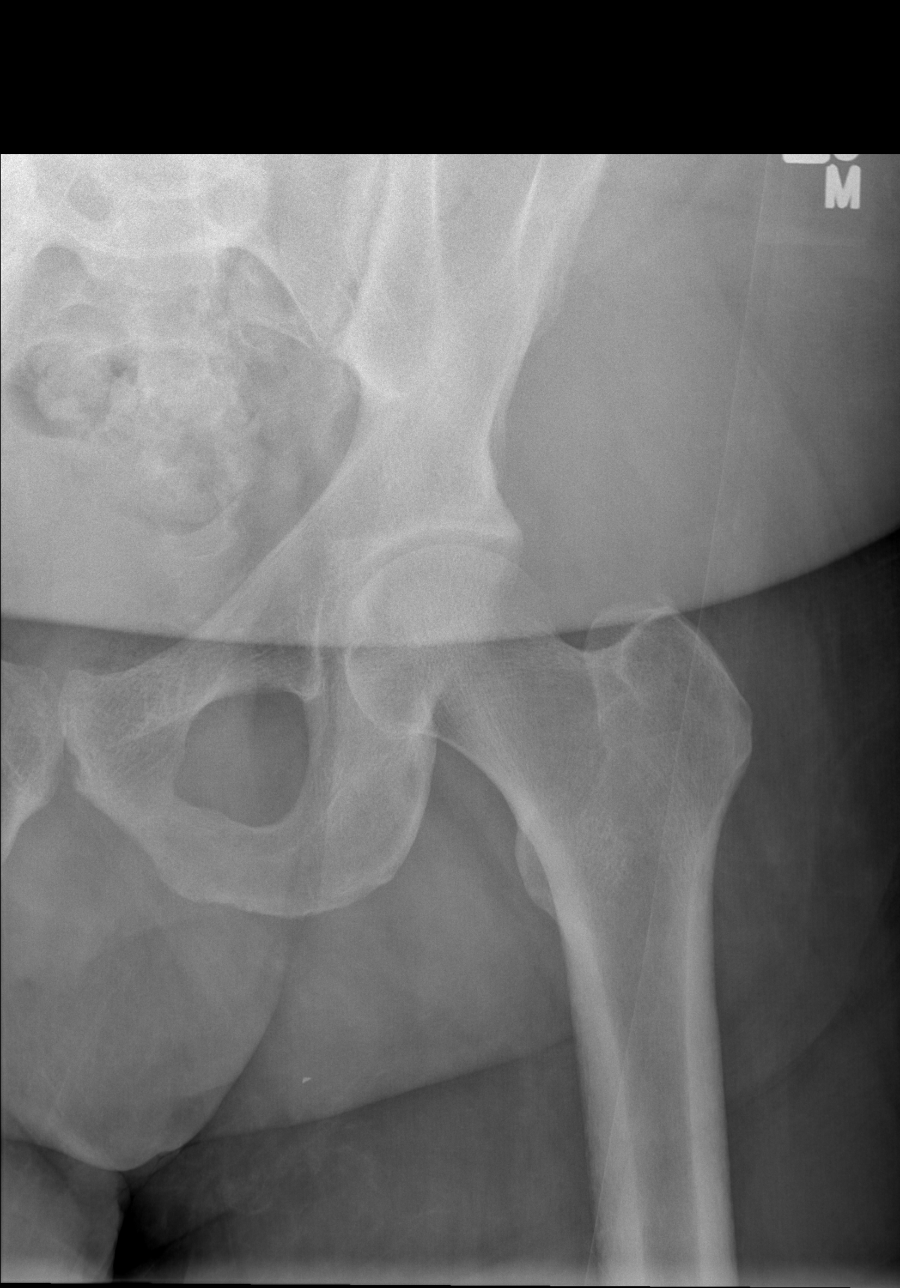

[t hip frog leg left (2 of 2)]
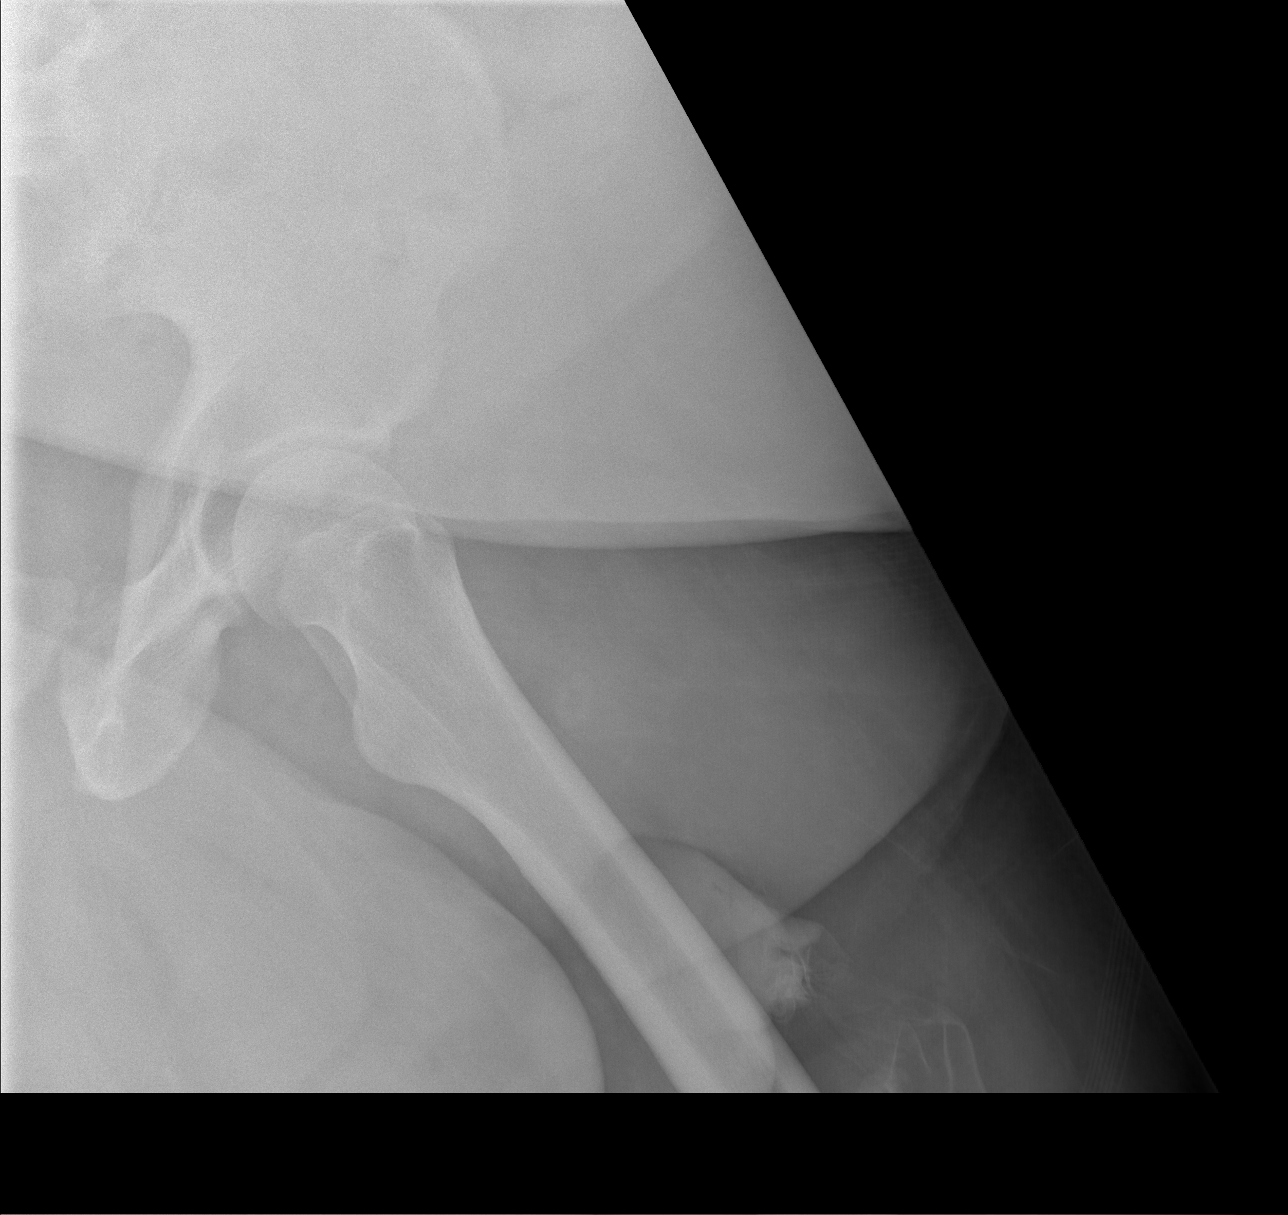

[4 of 4 positions shown; findings below may reference images not displayed]

FINDINGS: Four views of the left hip submitted.  No acute fracture
or subluxation.  Mild spurring bilateral greater femoral
trochanter.
IMPRESSION: No left hip acute fracture or subluxation.

## 2013-04-02 ENCOUNTER — Other Ambulatory Visit: Payer: Self-pay

## 2013-04-02 DIAGNOSIS — M7060 Trochanteric bursitis, unspecified hip: Secondary | ICD-10-CM

## 2013-04-02 DIAGNOSIS — I10 Essential (primary) hypertension: Secondary | ICD-10-CM

## 2013-04-02 DIAGNOSIS — G8110 Spastic hemiplegia affecting unspecified side: Secondary | ICD-10-CM

## 2013-04-02 DIAGNOSIS — I619 Nontraumatic intracerebral hemorrhage, unspecified: Secondary | ICD-10-CM

## 2013-04-02 MED ORDER — TRAMADOL HCL 50 MG PO TABS: 50.0000 mg | ORAL_TABLET | Freq: Four times a day (QID) | ORAL | Status: DC | PRN

## 2013-04-02 NOTE — Telephone Encounter (Signed)
Patient is requesting a refill on Tramadol. Refill called in to pharmacy. Patient is aware that refill has been called in.

## 2013-04-05 ENCOUNTER — Encounter: Payer: BC Managed Care – PPO | Admitting: Internal Medicine

## 2013-04-17 ENCOUNTER — Ambulatory Visit (INDEPENDENT_AMBULATORY_CARE_PROVIDER_SITE_OTHER): Payer: BC Managed Care – PPO | Admitting: Internal Medicine

## 2013-04-17 ENCOUNTER — Encounter: Payer: Self-pay | Admitting: Internal Medicine

## 2013-04-17 VITALS — BP 128/86 | HR 92 | Temp 97.2°F

## 2013-04-17 DIAGNOSIS — G8110 Spastic hemiplegia affecting unspecified side: Secondary | ICD-10-CM

## 2013-04-17 DIAGNOSIS — I10 Essential (primary) hypertension: Secondary | ICD-10-CM

## 2013-04-17 DIAGNOSIS — E785 Hyperlipidemia, unspecified: Secondary | ICD-10-CM

## 2013-04-17 LAB — LIPID PANEL
Cholesterol: 236 mg/dL — ABNORMAL HIGH (ref 0–200)
HDL: 35 mg/dL — ABNORMAL LOW (ref >39)
LDL Cholesterol: 169 mg/dL — ABNORMAL HIGH (ref 0–99)
Total CHOL/HDL Ratio: 6.7 Ratio
Triglycerides: 161 mg/dL — ABNORMAL HIGH (ref <150)
VLDL: 32 mg/dL (ref 0–40)

## 2013-04-17 LAB — COMPLETE METABOLIC PANEL WITH GFR
ALT: 20 U/L (ref 0–53)
AST: 21 U/L (ref 0–37)
Albumin: 3.9 g/dL (ref 3.5–5.2)
Alkaline Phosphatase: 95 U/L (ref 39–117)
BUN: 18 mg/dL (ref 6–23)
CO2: 33 mEq/L — ABNORMAL HIGH (ref 19–32)
Calcium: 10 mg/dL (ref 8.4–10.5)
Chloride: 99 mEq/L (ref 96–112)
Creat: 1.1 mg/dL (ref 0.50–1.35)
GFR, Est African American: >89 mL/min
GFR, Est Non African American: 80 mL/min
Glucose, Bld: 72 mg/dL (ref 70–99)
Potassium: 4.8 mEq/L (ref 3.5–5.3)
Sodium: 136 mEq/L (ref 135–145)
Total Bilirubin: 0.5 mg/dL (ref 0.2–1.2)
Total Protein: 8.4 g/dL — ABNORMAL HIGH (ref 6.0–8.3)

## 2013-04-17 NOTE — Assessment & Plan Note (Signed)
BP Readings from Last 3 Encounters:  04/17/13 128/86  01/15/13 139/86  10/19/12 128/84    Lab Results  Component Value Date   NA 143 02/03/2012   K 4.2 02/03/2012   CREATININE 0.89 02/03/2012    Assessment: Blood pressure control: controlled Progress toward BP goal:  at goal Comments:   Plan: Medications: Continue Metoprolol 50mg  BID, HCTZ 25mg  daily Educational resources provided:   Self management tools provided:   Other plans: Continue to keep BP log

## 2013-04-17 NOTE — Progress Notes (Signed)
Case discussed with Dr. Hoffman at the time of the visit.  We reviewed the resident's history and exam and pertinent patient test results.  I agree with the assessment, diagnosis, and plan of care documented in the resident's note. 

## 2013-04-17 NOTE — Progress Notes (Signed)
[facility] Patient ID: Jeremy [name], male   DOB: Jul 30, 1965, 48 y.o.   MRN: [id number]   Subjective:   Patient ID: Jeremy [name] male   DOB: [birth year] 48 y.o.   MRN: [id number]  HPI: Jeremy [name] is a 48 y.o. male with past medical history of intracerebral hemorrhage, hypertension, spastic hemiplegia, hyperlipidemia. He presents today primarily for evaluation for a mobility examination, he has been severely impaired for the past 1.5 years due to his ICH and resulting spastic hemiplegia.  He has mostly been using a manual wheelchair for mobility but depends significantly on his wife for assistance and cannot maneuver the chair around his house on his own.  He is able to transfer from the chair to a bedside commode but no longer able to get to the restroom and no longer able to due any cooking.  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  He did have some falls in the past but has not had another fall in the last 6 months.  At our last visit he did have a stage 2 sacral ulcer from his sitting but this has now resolved with improvement exercises and more attention to movement. He is unable to ambulate at all with cane or walker.  He is physically and mentally prepared for a mobility device and reports he is motivated to use it, he would like to resume some of the activities he used to enjoy around the house.  He has made great progress in the last few months and remains in great spirits.  He is accompanied by his wife who brings a BP log with entries roughly every 2-3 days.  His systolic BP has remained between 110s-131 with HR consistently in the 70s-80s.   Past Medical History  Diagnosis Date  . Hypertension 10/01/2011    basal ganglia  . Stroke   . High cholesterol    Current Outpatient Prescriptions  Medication Sig Dispense Refill  . baclofen (LIORESAL)  10 MG tablet Take 1 tablet (10 mg total) by mouth 4 (four) times daily.  120 each  2  . hydrochlorothiazide (HYDRODIURIL) 25 MG tablet Take 1 tablet (25 mg total) by mouth daily.  90 tablet  3  . metoprolol (LOPRESSOR) 50 MG tablet Take 1 tablet (50 mg total) by mouth 2 (two) times daily.  60 tablet  1  . prochlorperazine (COMPAZINE) 10 MG tablet Take 10 mg by mouth every 6 (six) hours as needed.      . traMADol (ULTRAM) 50 MG tablet Take 1 tablet (50 mg total) by mouth every 6 (six) hours as needed.  90 tablet  3   No current facility-administered medications for this visit.   Family History  Problem Relation Age of Onset  . Hypertension Mother   . Heart disease Father   . Gout Father   . Hypertension Father    History   Social History  . Marital Status: Married    Spouse Name: N/A    Number of Children: N/A  . Years of Education: N/A   Social History Main Topics  . Smoking status: Never Smoker   . Smokeless tobacco: None  . Alcohol Use: No  . Drug Use: No  . Sexual Activity: None   Other Topics Concern  . None   Social History Narrative  . None   Review of Systems  Constitutional: Negative for fever, chills, weight loss and malaise/fatigue.  HENT: Negative for congestion and tinnitus.   Eyes: Negative for blurred vision and pain.  Respiratory: Negative for cough and shortness of breath.   Cardiovascular: Negative for chest pain and leg swelling.  Gastrointestinal: Negative for heartburn, nausea, vomiting, abdominal pain, diarrhea and constipation.  Genitourinary: Negative for dysuria.  Musculoskeletal: Negative for falls.  Skin: Negative for itching.  Neurological: Positive for focal weakness. Negative for dizziness and headaches.  Psychiatric/Behavioral: Negative for depression.    Objective:  Physical Exam: Filed Vitals:   04/17/13 1019  BP: 133/90  Pulse: 92  Temp: 97.2 F (36.2 C)  TempSrc: Oral  SpO2: 95%   Physical Exam  Nursing note and vitals  reviewed. Constitutional: He is oriented to person, place, and time and well-developed, well-nourished, and in no distress. No distress.  In wheelchair  HENT:  Head: Normocephalic and atraumatic.  Eyes: Conjunctivae are normal. No scleral icterus.  Cardiovascular: Normal rate, regular rhythm, normal heart sounds and intact distal pulses.   No murmur heard. Pulmonary/Chest: Effort normal and breath sounds normal. No respiratory distress. He has no wheezes. He has no rales.  Abdominal: Soft. Bowel sounds are normal. He exhibits no distension. There is no tenderness.  Musculoskeletal: He exhibits no edema.  3+/5 Strength in LUE, 4-/5 left grip strength Active Abduction of LUE to about 80 degrees 3/5 strength in LLE Mild limitation of passive ROM of LLE Decreased sensation to light touch of LUE and LLE. 5/5 Strength RLE and RUE.   No sensory deficit to right side. Neurological: He is alert and oriented to person, place, and time.  Skin: Skin is warm and dry. He is not diaphoretic.  No sacral ulceration appreciated.  Psychiatric: Affect and judgment normal.     Assessment & Plan:  See problem based assessment and plan.

## 2013-04-17 NOTE — Assessment & Plan Note (Signed)
Patient interested in power mobility device which would help improve his independence at home. He continues to make some improvement with left side strength.  I spoke with Dr Naaman Plummer office, and they agree he would benefit from mobility device, do not need separate evaluation, he can keep his appointment to follow up with them in 3 months.

## 2013-04-17 NOTE — Assessment & Plan Note (Addendum)
LDL 190 in 2013, not started on Statin at that time due to [place].  Will repeat lipid profile and likely start statin medication.  ADDENDUM: LDL 169 called and updated patient, will start Atorvastatin 40mg .

## 2013-04-18 MED ORDER — ATORVASTATIN CALCIUM 40 MG PO TABS: 40.0000 mg | ORAL_TABLET | Freq: Every day | ORAL | Status: DC

## 2013-04-18 NOTE — Addendum Note (Signed)
Addended by: Joni Reining C on: 04/18/2013 10:54 AM   Modules accepted: Orders

## 2013-04-26 ENCOUNTER — Other Ambulatory Visit: Payer: Self-pay | Admitting: Internal Medicine

## 2013-05-07 ENCOUNTER — Encounter: Payer: Self-pay | Admitting: Internal Medicine

## 2013-05-07 ENCOUNTER — Ambulatory Visit (INDEPENDENT_AMBULATORY_CARE_PROVIDER_SITE_OTHER): Payer: BC Managed Care – PPO | Admitting: Internal Medicine

## 2013-05-07 VITALS — BP 122/87 | HR 69 | Temp 97.3°F | Ht 70.0 in | Wt 227.1 lb

## 2013-05-07 DIAGNOSIS — G8110 Spastic hemiplegia affecting unspecified side: Secondary | ICD-10-CM

## 2013-05-07 NOTE — Progress Notes (Signed)
Case discussed with Dr. Hoffman at the time of the visit.  We reviewed the resident's history and exam and pertinent patient test results.  I agree with the assessment, diagnosis and plan of care documented in the resident's note. 

## 2013-05-07 NOTE — Patient Instructions (Signed)
Please bring your medicines with you each time you come.   Medicines may be  Eye drops  Herbal   Vitamins  Pills  Seeing these help us take care of you.  

## 2013-05-07 NOTE — Assessment & Plan Note (Signed)
Recommend power wheelchair with tilt and recline features as patient has history of sacral ulcer and unable to relieve pressure on his own.

## 2013-05-07 NOTE — Progress Notes (Signed)
[facility] Subjective:   Patient ID: Jeremy [name] male   DOB: Nov 23, 1965 48 y.o.   MRN: [id number]  HPI: Jeremy [name] is a 48 y.o. male with a PMH significant for HTN, intracerebral hemorrhage, spastic hemiplegia, hyperlipidemia. He presents today primarily for evaluation for a power wheelchair assessment. He has been evaluated by physical therapy, I have reviewed their documentation and agree with it. He has been severely impaired for the past 1.5 years due to his ICH and resulting spastic hemiplegia. He has mostly been using a manual wheelchair for mobility but depends significantly on his wife for assistance and cannot maneuver the chair around his house on his own. He is able to transfer from the chair to a bedside commode but no longer able to get to the restroom and no longer able to due any cooking. 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 He did have some falls in the past but has not had another fall in the last 6 months. He does have a history of a stage 2 sacral ulcer from his sitting but this has now resolved with improvement exercises and more attention to movement from his caregiver.  Patient cannot readjust and unable to provide pressure relief on his own. He is unable to ambulate at all with cane or walker. He is physically and mentally prepared for a mobility device and reports he is motivated to use it everyday at home, he would like to resume some of the activities he used to enjoy around the house such as cooking.   Past Medical History  Diagnosis Date  . Hypertension 10/01/2011    basal ganglia  . Stroke   . High cholesterol    Current Outpatient Prescriptions  Medication Sig Dispense Refill  . atorvastatin (LIPITOR) 40 MG tablet Take 1 tablet (40 mg total) by mouth daily at 6 PM.  30 tablet  11  . baclofen (LIORESAL) 10  MG tablet Take 1 tablet (10 mg total) by mouth 4 (four) times daily.  120 each  2  . hydrochlorothiazide (HYDRODIURIL) 25 MG tablet Take 1 tablet (25 mg total) by mouth daily.  90 tablet  3  . metoprolol (LOPRESSOR) 50 MG tablet TAKE ONE TABLET BY MOUTH TWICE DAILY  60 tablet  0  . prochlorperazine (COMPAZINE) 10 MG tablet Take 10 mg by mouth every 6 (six) hours as needed.      . traMADol (ULTRAM) 50 MG tablet Take 1 tablet (50 mg total) by mouth every 6 (six) hours as needed.  90 tablet  3   No current facility-administered medications for this visit.   Family History  Problem Relation Age of Onset  . Hypertension Mother   . Heart disease Father   . Gout Father   . Hypertension Father    History   Social History  . Marital Status: Married    Spouse Name: N/A    Number of Children: N/A  . Years of Education: N/A   Social History Main Topics  . Smoking status: Never Smoker   . Smokeless tobacco: None  . Alcohol Use: No  . Drug Use: No  . Sexual Activity: None   Other Topics Concern  . None   Social History Narrative  . None   Review of Systems: Review of Systems  Constitutional: Negative for weight loss.  HENT: Negative  for congestion and tinnitus.   Respiratory: Negative for cough and shortness of breath.   Cardiovascular: Negative for chest pain and leg swelling.  Genitourinary: Negative for dysuria.  Musculoskeletal: Negative for falls.  Neurological: Positive for focal weakness. Negative for dizziness and headaches.  Psychiatric/Behavioral: Negative for depression.     Objective:  Physical Exam: Filed Vitals:   05/07/13 1551  BP: 122/87  Pulse: 69  Temp: 97.3 F (36.3 C)  TempSrc: Oral  Height: 5\' 10"  (1.778 m)  Weight: 227 lb 1.6 oz (103.012 kg)  SpO2: 98%  Physical Exam Nursing note and vitals reviewed.  Constitutional: He is oriented to person, place, and time and well-developed, well-nourished, and in no distress. No distress.  In wheelchair   HENT:  Head: Normocephalic and atraumatic.  Eyes: Conjunctivae are normal. No scleral icterus.  Cardiovascular: Normal rate, regular rhythm, normal heart sounds and intact distal pulses.  No murmur heard.  Pulmonary/Chest: Effort normal and breath sounds normal. No respiratory distress. He has no wheezes. He has no rales.  Musculoskeletal: He exhibits no edema.  2/5 Strength in LUE, 2/5 left grip strength  2/5 strength in LLE   Mild limitation of passive ROM of LLE  Decreased sensation to light touch of LUE and LLE. 5/5 Strength RLE and RUE.  No sensory deficit to right side. Neurological: He is alert and oriented to person, place, and time.  Skin: Skin is warm and dry. He is not diaphoretic.  No sacral ulceration appreciated. Psychiatric: Affect and judgment normal.   Assessment & Plan:  Case discussed with Dr. Eppie Gibson See Problem Based Assessment and Plan Medications Ordered No orders of the defined types were placed in this encounter.   Other Orders No orders of the defined types were placed in this encounter.

## 2013-05-11 ENCOUNTER — Ambulatory Visit (INDEPENDENT_AMBULATORY_CARE_PROVIDER_SITE_OTHER): Payer: BC Managed Care – PPO | Admitting: Nurse Practitioner

## 2013-05-11 ENCOUNTER — Encounter: Payer: Self-pay | Admitting: Nurse Practitioner

## 2013-05-11 VITALS — BP 141/84 | HR 71 | Ht 70.0 in | Wt 227.0 lb

## 2013-05-11 DIAGNOSIS — I619 Nontraumatic intracerebral hemorrhage, unspecified: Secondary | ICD-10-CM

## 2013-05-11 DIAGNOSIS — G8110 Spastic hemiplegia affecting unspecified side: Secondary | ICD-10-CM

## 2013-05-11 NOTE — Patient Instructions (Addendum)
Maintain strict control of hypertension with blood pressure goal below 140/90,  and lipids with LDL cholesterol goal below 100 mg/dL. Followup in the future with me in 6 months.

## 2013-05-11 NOTE — Progress Notes (Signed)
PATIENT: Jeremy [name] DOB: [birth year]  REASON FOR VISIT: follow up HISTORY FROM: patient  HISTORY OF PRESENT ILLNESS: Mr. Darrien Belter is a 48 year old male with history of hypertension not treated times years. He was admitted on October 01, 2011, with headache nausea, vomiting, and difficulty using left hand. CT of head done revealed right basal ganglia hemorrhage with surrounding edema. He was started on nicardipine drip for blood pressure control. He did develop somnolence due to extension of hemorrhage to right thalamus and posterior third ventricle with concerns for developing obstructive hydrocephalus. Intraventricular cath was placed by Dr. Annette Stable. He did require replacement of catheter due to malfunction on August 21,2013. He had difficulty weaning of the vent and was trached on October 11, 2011, and gastrostomy tube was placed by Interventional Radiology on October 13, 2011. The patient has had an episode of projectile vomiting. On October 20, 2011, KUB done showed no evidence of obstruction. Rate was adjusted with resolution of symptoms. His trach has been downsized to cuffless Shiley 6 done and regular diet initiated. The patient continued to be impaired due to dense left hemiparesis and sensory deficits, left neglect aphonia with cognitive deficits, as well as visual deficits in upper fields.   UPDATE 09/14/12 (LL): Patient comes to office for first time since [place] almost 1 year ago. He is accompanied by his spouse. He has made slow gradual progress. Able to eat regular diet, still has dense left hemiparesis and sensory deficits, left neglect, with cognitive deficits. He has completed all of the therapy sessions that insurance would allow. He is not able to walk, despite having AFO brace for LLE and decent strength. Has been taking medications as prescribed, with close monitoring from spouse. BP is under good control.   UPDATE 05/11/13 (LL): Patient comes in for [place] follow up, last visit 6  months ago.  He has regained some more strength in right upper and lower extremities but still unable to bear weight on left leg.  He states he has regained more feeling on his left hand and left lateral torso since last visit.  His wife states he has some short term memory problems, but she helps him with his medications.  He has no complaints today.  States he has only very occasional left hip pain.  BP in office today is 141/84.  His recent lipid panel showed total cholesterol of 236 and LDL of 169; he was started on Lipitor 40 mg.  REVIEW OF SYSTEMS: Full 14 system review of systems performed and notable only for: no complaints   ALLERGIES: Allergies  Allergen Reactions  . Lisinopril Itching    HOME MEDICATIONS: Outpatient Prescriptions Prior to Visit  Medication Sig Dispense Refill  . atorvastatin (LIPITOR) 40 MG tablet Take 1 tablet (40 mg total) by mouth daily at 6 PM.  30 tablet  11  . baclofen (LIORESAL) 10 MG tablet Take 1 tablet (10 mg total) by mouth 4 (four) times daily.  120 each  2  . hydrochlorothiazide (HYDRODIURIL) 25 MG tablet Take 1 tablet (25 mg total) by mouth daily.  90 tablet  3  . metoprolol (LOPRESSOR) 50 MG tablet TAKE ONE TABLET BY MOUTH TWICE DAILY  60 tablet  0  . prochlorperazine (COMPAZINE) 10 MG tablet Take 10 mg by mouth every 6 (six) hours as needed.      . traMADol (ULTRAM) 50 MG tablet Take 1 tablet (50 mg total) by mouth every 6 (six) hours as needed.  90 tablet  3   No facility-administered medications prior to visit.   PHYSICAL EXAM  Filed Vitals:   05/11/13 1401  BP: 141/84  Pulse: 71  Height: 5\' 10"  (1.778 m)  Weight: 227 lb (102.967 kg)   Body mass index is 32.57 kg/(m^2).  Generalized: In no acute distress, obese AA male in wheelchair  Neck: Supple, no carotid bruits  Cardiac: Regular rate rhythm, no murmur  Pulmonary: Clear to auscultation bilaterally  Musculoskeletal: No deformity   Neurological examination  Mentation: Alert  oriented to time, place, history taking, language fluent, soft voice  Cranial nerve II-XII: Pupils were equal round reactive to light extraocular movements were full, visual field were full on confrontational test. facial sensation decreased on left, strength normal. hearing was intact to finger rubbing bilaterally. Uvula tongue midline. head turning and shoulder shrug and were normal and symmetric.Tongue protrusion into cheek strength was normal.  MOTOR: 5/5 motor on the right. LUE is 3-4/5. LLE is 3-4/5.  SENSORY: Sensory exam is 0-1/2 on the left. Normal on the right.  COORDINATION: FTF slow on left, but able. Normal on right.  REFLEXES: Reflexes are 3+ on the left. 2+ right  GAIT/STATION: unable.    ASSESSMENT AND PLAN 48 y.o. year old male has a past medical history of Hypertension and High cholesterol here for follow up for ICH on (10/01/2011). Has made slow, gradual recovery. Trach removed, g-tube removed, back on regular diet. Dense left hemiparesis and sensory deficits, left neglect, with cognitive deficits.    Maintain strict control of hypertension with blood pressure goal below 130/90,  and lipids with LDL cholesterol goal below 100 mg/dL. Followup in the future in 6 months.  Philmore Pali, MSN, NP-C 05/11/2013, 2:10 PM [facility] [address] [place], [place] [address] [phone]  Note: This document was prepared with digital dictation and possible smart phrase technology. Any transcriptional errors that result from this process are unintentional.

## 2013-05-28 ENCOUNTER — Other Ambulatory Visit: Payer: Self-pay | Admitting: *Deleted

## 2013-05-28 MED ORDER — METOPROLOL TARTRATE 50 MG PO TABS: ORAL_TABLET | ORAL | Status: DC

## 2013-07-16 ENCOUNTER — Encounter
Payer: BC Managed Care – PPO | Attending: Physical Medicine & Rehabilitation | Admitting: Physical Medicine & Rehabilitation

## 2013-07-23 ENCOUNTER — Ambulatory Visit: Payer: BC Managed Care – PPO | Admitting: Nurse Practitioner

## 2013-08-09 ENCOUNTER — Ambulatory Visit (INDEPENDENT_AMBULATORY_CARE_PROVIDER_SITE_OTHER): Payer: BC Managed Care – PPO | Admitting: Internal Medicine

## 2013-08-09 ENCOUNTER — Encounter: Payer: Self-pay | Admitting: Internal Medicine

## 2013-08-09 VITALS — BP 132/96 | HR 71 | Temp 98.3°F

## 2013-08-09 DIAGNOSIS — D649 Anemia, unspecified: Secondary | ICD-10-CM

## 2013-08-09 DIAGNOSIS — G8110 Spastic hemiplegia affecting unspecified side: Secondary | ICD-10-CM

## 2013-08-09 DIAGNOSIS — E785 Hyperlipidemia, unspecified: Secondary | ICD-10-CM

## 2013-08-09 DIAGNOSIS — I10 Essential (primary) hypertension: Secondary | ICD-10-CM

## 2013-08-09 LAB — CBC
HCT: 38.4 % — ABNORMAL LOW (ref 39.0–52.0)
Hemoglobin: 13 g/dL (ref 13.0–17.0)
MCH: 29.8 pg (ref 26.0–34.0)
MCHC: 33.9 g/dL (ref 30.0–36.0)
MCV: 88.1 fL (ref 78.0–100.0)
Platelets: 314 10*3/uL (ref 150–400)
RBC: 4.36 MIL/uL (ref 4.22–5.81)
RDW: 12.8 % (ref 11.5–15.5)
WBC: 6.8 10*3/uL (ref 4.0–10.5)

## 2013-08-09 LAB — LIPID PANEL
Cholesterol: 143 mg/dL (ref 0–200)
HDL: 34 mg/dL — ABNORMAL LOW (ref >39)
LDL Cholesterol: 92 mg/dL (ref 0–99)
Total CHOL/HDL Ratio: 4.2 Ratio
Triglycerides: 87 mg/dL (ref <150)
VLDL: 17 mg/dL (ref 0–40)

## 2013-08-09 MED ORDER — BACLOFEN 10 MG PO TABS: 10.0000 mg | ORAL_TABLET | Freq: Four times a day (QID) | ORAL | Status: DC

## 2013-08-09 MED ORDER — PROCHLORPERAZINE MALEATE 10 MG PO TABS: 10.0000 mg | ORAL_TABLET | Freq: Four times a day (QID) | ORAL | Status: DC | PRN

## 2013-08-09 NOTE — Patient Instructions (Signed)
Please continue your current medications, I will call you with results.  General Instructions:   Please try to bring all your medicines next time. This will help [place] keep you safe from mistakes.   Progress Toward Treatment Goals:  Treatment Goal 04/17/2013  Blood pressure at goal    Self Care Goals & Plans:  Self Care Goal 08/09/2013  Manage my medications take my medicines as prescribed; bring my medications to every visit; refill my medications on time  Monitor my health keep track of my blood pressure; bring my blood pressure log to each visit  Eat healthy foods eat more vegetables; eat foods that are low in salt; eat baked foods instead of fried foods; drink diet soda or water instead of juice or soda  Be physically active (No Data)  Meeting treatment goals -    No flowsheet data found.   Care Management & Community Referrals:  Referral 04/17/2013  Referrals made for care management support none needed  Referrals made to community resources none

## 2013-08-09 NOTE — Assessment & Plan Note (Signed)
Patient with history of anemia, last Hgb was 10.2 in hospital.  Will check CBC with labs at this time.

## 2013-08-09 NOTE — Assessment & Plan Note (Signed)
BP Readings from Last 3 Encounters:  08/09/13 132/96  05/11/13 141/84  05/07/13 122/87    Lab Results  Component Value Date   NA 136 04/17/2013   K 4.8 04/17/2013   CREATININE 1.10 04/17/2013    Assessment: Blood pressure control: controlled Progress toward BP goal:  at goal Comments: mildly elevated in office but well controlled with home log  Plan: Medications:  Continue HCTZ 25mg  daily, Metoprolol 50 BID Educational resources provided:   Self management tools provided:   Other plans: Follow up in 4-6 months if no acute issues arrise.

## 2013-08-09 NOTE — Assessment & Plan Note (Addendum)
Patient on Atrovastatin 40mg  daily, may have had some myalgias associated but appear to have passed. - repeat Lipid panel today

## 2013-08-09 NOTE — Progress Notes (Signed)
Attending physician note: Presenting problems, physical findings, medications, review with resident physician Dr. Johnnette Gourd and I concur with his management. Murriel Hopper, M.D., [place]

## 2013-08-09 NOTE — Progress Notes (Signed)
[facility] Subjective:   Patient ID: Jeremy [name] male   DOB: August 26, 1965 48 y.o.   MRN: [id number]  HPI: [name] is a 48 y.o. male with a PMH significant for HTN, intracerebral hemorrhage, spastic hemiplegia, hyperlipidemia. He presents today for regular followup exam. He reports he is doing very well.  Since I saw him last he has a new motorized wheelchair which he reports he is still getting used to.  He notes that he has been taking his medication as prescribed.  He was started on Lipitor a few months ago, he notes he had some leg cramps initially but that has resolved and he has continued taking it. His wife reports that he had a headache a few days ago.  She checked his BP and its was elevated around 160/100, she gave him an extra dose of HCTZ and rechecked later and it was 100s/70s.  I explained that it may be a better idea to treat his headache first and to call the [facility] if it remains elevated. He brings his BP log (to be scanned in) which reveals his BP has been well controlled at home with >95% of readings >130/90.  Patient need a new mattress overlay for his bed.  After his stroke he has spastic hemiplegia and is unable to reposition himself alone. He has a history of pressure ulcers.   Past Medical History  Diagnosis Date  . Hypertension 10/01/2011    basal ganglia  . Stroke   . High cholesterol    Current Outpatient Prescriptions  Medication Sig Dispense Refill  . atorvastatin (LIPITOR) 40 MG tablet Take 1 tablet (40 mg total) by mouth daily at 6 PM.  30 tablet  11  . baclofen (LIORESAL) 10 MG tablet Take 1 tablet (10 mg total) by mouth 4 (four) times daily.  120 each  2  . hydrochlorothiazide (HYDRODIURIL) 25 MG tablet Take 1 tablet (25 mg total) by mouth daily.  90 tablet  3  . metoprolol (LOPRESSOR) 50 MG tablet TAKE ONE TABLET BY MOUTH TWICE DAILY  60 tablet  11  . prochlorperazine (COMPAZINE) 10 MG tablet Take 10 mg by mouth every 6 (six)  hours as needed.      . traMADol (ULTRAM) 50 MG tablet Take 1 tablet (50 mg total) by mouth every 6 (six) hours as needed.  90 tablet  3   No current facility-administered medications for this visit.   Family History  Problem Relation Age of Onset  . Hypertension Mother   . Heart disease Father   . Gout Father   . Hypertension Father    History   Social History  . Marital Status: Married    Spouse Name: N/A    Number of Children: 0  . Years of Education: 12th   Occupational History  . retired [employer]   Social History Main Topics  . Smoking status: Never Smoker   . Smokeless tobacco: None  . Alcohol Use: No  . Drug Use: No  . Sexual Activity: None   Other Topics Concern  . None   Social History Narrative   Patient lives with his wife..and drinks sodas.   Review of Systems: Review of Systems  Constitutional: Negative for fever, chills, weight loss and malaise/fatigue.  Eyes: Negative for blurred vision.  Respiratory: Negative for cough and shortness of breath.   Cardiovascular: Negative for chest pain.  Gastrointestinal: Negative for heartburn and abdominal pain.  Genitourinary: Negative for dysuria.  Musculoskeletal:  Negative for myalgias.  Neurological: Positive for focal weakness (reports improved left side strength ) and headaches (once a few days ago). Negative for dizziness, tingling and sensory change.  Psychiatric/Behavioral: Negative for depression.     Objective:  Physical Exam: Filed Vitals:   08/09/13 1344  BP: 132/96  Pulse: 71  Temp: 98.3 F (36.8 C)  TempSrc: Oral  SpO2: 96%   Physical Exam  Nursing note and vitals reviewed. Constitutional: He is oriented to person, place, and time and well-developed, well-nourished, and in no distress.  In power wheelchair  HENT:  Head: Normocephalic and atraumatic.  Cardiovascular: Normal rate, regular rhythm and normal heart sounds.   Pulmonary/Chest: Effort normal and breath sounds  normal.  Abdominal: Soft. Bowel sounds are normal. He exhibits no distension. There is no tenderness.  Musculoskeletal: He exhibits no edema.  3+ to 4 strength of LUE, 3/5 LLE strength.    Neurological: He is alert and oriented to person, place, and time. No cranial nerve deficit.     Assessment & Plan:  Case discussed with Dr. Beryle Beams See Problem Based Assessment and Plan Medications Ordered Meds ordered this encounter  Medications  . prochlorperazine (COMPAZINE) 10 MG tablet    Sig: Take 1 tablet (10 mg total) by mouth every 6 (six) hours as needed for nausea.    Dispense:  30 tablet    Refill:  5  . baclofen (LIORESAL) 10 MG tablet    Sig: Take 1 tablet (10 mg total) by mouth 4 (four) times daily.    Dispense:  120 each    Refill:  2   Other Orders Orders Placed This Encounter  Procedures  . Lipid Profile

## 2013-08-09 NOTE — Assessment & Plan Note (Signed)
Patient doing well with new wheelchair,  Appears to continue to be having some gradual improvement with his strength which he is very proud of. -Will try to assist in obtaining gel overlay. - Continue to follow up with PM&R and Neurology as directed.

## 2013-10-11 ENCOUNTER — Other Ambulatory Visit: Payer: Self-pay | Admitting: *Deleted

## 2013-10-12 MED ORDER — HYDROCHLOROTHIAZIDE 25 MG PO TABS: 25.0000 mg | ORAL_TABLET | Freq: Every day | ORAL | Status: DC

## 2013-10-19 ENCOUNTER — Other Ambulatory Visit: Payer: Self-pay | Admitting: *Deleted

## 2013-10-19 DIAGNOSIS — E785 Hyperlipidemia, unspecified: Secondary | ICD-10-CM

## 2013-10-19 MED ORDER — BACLOFEN 10 MG PO TABS: 10.0000 mg | ORAL_TABLET | Freq: Four times a day (QID) | ORAL | Status: DC

## 2013-10-19 MED ORDER — ATORVASTATIN CALCIUM 40 MG PO TABS: 40.0000 mg | ORAL_TABLET | Freq: Every day | ORAL | Status: DC

## 2013-10-19 MED ORDER — METOPROLOL TARTRATE 50 MG PO TABS: ORAL_TABLET | ORAL | Status: DC

## 2013-10-19 MED ORDER — HYDROCHLOROTHIAZIDE 25 MG PO TABS: 25.0000 mg | ORAL_TABLET | Freq: Every day | ORAL | Status: DC

## 2013-10-19 NOTE — Telephone Encounter (Signed)
Gave 90 day refills on meds except Baclofen. Gave 1 month, due to see PMR- Dr. Tessa Lerner on 11/21/13

## 2013-11-13 ENCOUNTER — Telehealth: Payer: Self-pay | Admitting: Nurse Practitioner

## 2013-11-13 ENCOUNTER — Ambulatory Visit: Payer: BC Managed Care – PPO | Admitting: Nurse Practitioner

## 2013-11-13 NOTE — Telephone Encounter (Signed)
Same day cancellation - sick °

## 2013-11-21 ENCOUNTER — Encounter
Payer: BC Managed Care – PPO | Attending: Physical Medicine & Rehabilitation | Admitting: Physical Medicine & Rehabilitation

## 2013-11-21 ENCOUNTER — Encounter: Payer: Self-pay | Admitting: Physical Medicine & Rehabilitation

## 2013-11-21 VITALS — BP 133/86 | HR 103 | Resp 16 | Wt 232.0 lb

## 2013-11-21 DIAGNOSIS — I619 Nontraumatic intracerebral hemorrhage, unspecified: Secondary | ICD-10-CM | POA: Insufficient documentation

## 2013-11-21 DIAGNOSIS — G8110 Spastic hemiplegia affecting unspecified side: Secondary | ICD-10-CM

## 2013-11-21 DIAGNOSIS — M7062 Trochanteric bursitis, left hip: Secondary | ICD-10-CM | POA: Diagnosis not present

## 2013-11-21 DIAGNOSIS — I613 Nontraumatic intracerebral hemorrhage in brain stem: Secondary | ICD-10-CM | POA: Diagnosis not present

## 2013-11-21 DIAGNOSIS — G8114 Spastic hemiplegia affecting left nondominant side: Secondary | ICD-10-CM | POA: Diagnosis not present

## 2013-11-21 DIAGNOSIS — I10 Essential (primary) hypertension: Secondary | ICD-10-CM | POA: Diagnosis not present

## 2013-11-21 NOTE — Progress Notes (Signed)
Subjective:    Patient ID: Jeremy [name], male    DOB: 1966-01-04, 48 y.o.   MRN: [id number]  HPI  Jeremy [name] is back regarding his thalamic hemorrhage. He has reported some tingling in his left arm and leg. It is not associated with pain for the most part. He has had increased movement in the left arm and leg. He uses a manual chair generally at home and a power chair outside the home. He stands up in his walker occasionally for exercise.  Spasticity has resolved. He is still taking the baclofen 10mg  qid.   He is rarely using the tramadol. He may occasionally use it for his hip when it's sore.   He is continent of bowel and bladder. BP control has been improved.   He can don/doff clothing independently for the most part. He still needs a little help with bathing. He is independent with toileting also.    Pain Inventory Average Pain 0 Pain Right Now 0 My pain is tingling  In the last 24 hours, has pain interfered with the following? General activity 0 Relation with others 0 Enjoyment of life 0 What TIME of day is your pain at its worst? night Sleep (in general) Good  Pain is worse with: no pain Pain improves with: no pain Relief from Meds: no pain  Mobility do you drive?  no use a wheelchair  Function disabled: date disabled .  Neuro/Psych tingling  Prior Studies Any changes since last visit?  no  Physicians involved in your care Any changes since last visit?  no   Family History  Problem Relation Age of Onset  . Hypertension Mother   . Heart disease Father   . Gout Father   . Hypertension Father    History   Social History  . Marital Status: Married    Spouse Name: N/A    Number of Children: 0  . Years of Education: 12th   Occupational History  . retired [employer]   Social History Main Topics  . Smoking status: Never Smoker   . Smokeless tobacco: None  . Alcohol Use: No  . Drug Use: No  . Sexual Activity: None   Other Topics  Concern  . None   Social History Narrative   Patient lives with his wife..and drinks sodas.   Past Surgical History  Procedure Laterality Date  . Tracheostomy tube placement  10/11/2011    Procedure: TRACHEOSTOMY;  Surgeon: Melida Quitter, MD;  Location: [place];  Service: ENT;  Laterality: N/A;   Past Medical History  Diagnosis Date  . Hypertension 10/01/2011    basal ganglia  . Stroke   . High cholesterol    BP 133/86  Pulse 103  Resp 16  Wt 232 lb (105.235 kg)  SpO2 98%  Opioid Risk Score:   Fall Risk Score: Low Fall Risk (0-5 points)   Review of Systems  Neurological:       Tingling  All other systems reviewed and are negative.      Objective:   Physical Exam  General: Alert and oriented x 3, No apparent distress, overweight  HEENT: Head is normocephalic, atraumatic, PERRLA, EOMI, exopthalmos, sclera anicteric, oral mucosa pink and moist, dentition poor, ext ear canals clear,  Neck: Supple without JVD or lymphadenopathy  Heart: Reg rate and rhythm. No murmurs rubs or gallops  Chest: CTA bilaterally without wheezes, rales, or rhonchi; no distress  Abdomen: Soft, non-tender, non-distended, bowel sounds positive. Tube site healed.  Extremities:  No clubbing, cyanosis, or edema. Pulses are 2+  Skin: Clean and intact without signs of breakdown  Neuro: Pt is cognitively appropriate with normal insight, memory, and awareness.speech volume improved, minimal dysarthria. Cranial nerves 2-12 are notable for better tracking and conjugate gaze, mild left facial weakness still... Sensory exam is  1/2 on the left at the thumb/hand.  Reflexes are 3+ on the left. No resting tone. .. 5/5 motor on the right. LUE is 4/5 prox to distal. LLE is 3+ to 4/5.   Musculoskeletal: Full ROM, . no pain with ER, IR, and flexion of the the left hip.  Psych: Pt's affect is appropriate. Pt is cooperative   Assessment & Plan:   ASSESSMENT: 1. Right BG/thalamic hemorrhage- he is making gradual progress    2. HTN  3. Dysphagia (resolved) .  4. Spastic left hemiparesis.  5. Mild left greater trochanter bursitis--still has an occasional flare.    Plan:  1. Made a referral to neuro-rehab to address rom, strength balance, functional mobility, gait, ADL's. He has made great progress since I last saw him! He obviously has been working on things at home. Wife is to be credited also. 2. Dental mgt as possible  4. Wean baclofen  5. Blood pressure control with lopressor, HCTZ --improved as a whole.  6. Maintain HEP as possible.   7. Follow up here in about 2 months. Thirty minutes of face to face patient care time were spent during this visit. All questions were encouraged and answered.

## 2013-11-21 NOTE — Patient Instructions (Signed)
BACLOFEN: DROP ONE PILL PER 5 DAYS UNTIL OFF.  RESUME IF SPASMS RECUR

## 2013-12-13 ENCOUNTER — Encounter: Payer: BC Managed Care – PPO | Admitting: Internal Medicine

## 2013-12-13 ENCOUNTER — Ambulatory Visit (INDEPENDENT_AMBULATORY_CARE_PROVIDER_SITE_OTHER): Payer: BC Managed Care – PPO | Admitting: Internal Medicine

## 2013-12-13 ENCOUNTER — Encounter: Payer: Self-pay | Admitting: Internal Medicine

## 2013-12-13 VITALS — BP 148/94 | HR 71 | Temp 98.4°F

## 2013-12-13 DIAGNOSIS — A630 Anogenital (venereal) warts: Secondary | ICD-10-CM | POA: Diagnosis not present

## 2013-12-13 DIAGNOSIS — I69154 Hemiplegia and hemiparesis following nontraumatic intracerebral hemorrhage affecting left non-dominant side: Secondary | ICD-10-CM | POA: Diagnosis not present

## 2013-12-13 DIAGNOSIS — Z23 Encounter for immunization: Secondary | ICD-10-CM

## 2013-12-13 DIAGNOSIS — I10 Essential (primary) hypertension: Secondary | ICD-10-CM | POA: Diagnosis not present

## 2013-12-13 DIAGNOSIS — Z0000 Encounter for general adult medical examination without abnormal findings: Secondary | ICD-10-CM

## 2013-12-13 DIAGNOSIS — E785 Hyperlipidemia, unspecified: Secondary | ICD-10-CM

## 2013-12-13 DIAGNOSIS — R531 Weakness: Secondary | ICD-10-CM

## 2013-12-13 MED ORDER — AMLODIPINE BESYLATE 5 MG PO TABS: 5.0000 mg | ORAL_TABLET | Freq: Every day | ORAL | Status: DC

## 2013-12-13 MED ORDER — PODOFILOX 0.5 % EX GEL: Freq: Two times a day (BID) | CUTANEOUS | Status: DC

## 2013-12-13 NOTE — Assessment & Plan Note (Signed)
Flu shot given today

## 2013-12-13 NOTE — Progress Notes (Signed)
Case discussed with Dr. Hoffman at time of visit. We reviewed the resident's history and exam and pertinent patient test results. I agree with the assessment, diagnosis, and plan of care documented in the resident's note. 

## 2013-12-13 NOTE — Progress Notes (Signed)
[facility] Subjective:   Patient ID: Jeremy [name] male   DOB: [birth year] 48 y.o.   MRN: [id number]  HPI: [name] is a 48 y.o. male with a PMH significant for HTN, intracerebral hemorrhage, spastic hemiplegia, hyperlipidemia. He presents today for regular followup exam.  Spastic Hemiplegia after ICH: Since I last saw him he has followed up with Dr. Naaman Plummer with Rehabilitation who noted improvement of his spacticity and discontinued his baclofen.  Otherwise is impressed with his progress and has referred him for Neuro rehab evaluation.  HTN He brings his BP log (to be scanned in) which reveals his BP has been mildly elevated with many diastolic pressures over 90.  His BP is elevated today at 148/94.  He did take his BP medications today as they were running late for the Bus.  Bumps on groin region Wife reports they first started showing up about a month ago, never had anything like this before. No itching, not red. Not painful.  Reports not bothering him at all.   Past Medical History  Diagnosis Date  . Hypertension 10/01/2011    basal ganglia  . Stroke   . High cholesterol    Current Outpatient Prescriptions  Medication Sig Dispense Refill  . atorvastatin (LIPITOR) 40 MG tablet Take 1 tablet (40 mg total) by mouth daily at 6 PM.  90 tablet  3  . baclofen (LIORESAL) 10 MG tablet Take 1 tablet (10 mg total) by mouth 4 (four) times daily.  120 each  0  . hydrochlorothiazide (HYDRODIURIL) 25 MG tablet Take 1 tablet (25 mg total) by mouth daily.  90 tablet  3  . metoprolol (LOPRESSOR) 50 MG tablet TAKE ONE TABLET BY MOUTH TWICE DAILY  180 tablet  3  . prochlorperazine (COMPAZINE) 10 MG tablet Take 1 tablet (10 mg total) by mouth every 6 (six) hours as needed for nausea.  30 tablet  5  . traMADol (ULTRAM) 50 MG tablet Take 1 tablet (50 mg total) by mouth every 6 (six) hours as needed.  90 tablet  3   No current facility-administered medications for this  visit.   Family History  Problem Relation Age of Onset  . Hypertension Mother   . Heart disease Father   . Gout Father   . Hypertension Father    History   Social History  . Marital Status: Married    Spouse Name: N/A    Number of Children: 0  . Years of Education: 12th   Occupational History  . retired [employer]   Social History Main Topics  . Smoking status: Never Smoker   . Smokeless tobacco: None  . Alcohol Use: No  . Drug Use: No  . Sexual Activity: None   Other Topics Concern  . None   Social History Narrative   Patient lives with his wife..and drinks sodas.   Review of Systems: Review of Systems  Constitutional: Negative for fever, chills, weight loss and malaise/fatigue.  Eyes: Negative for blurred vision.  Respiratory: Negative for cough and shortness of breath.   Cardiovascular: Negative for chest pain.  Gastrointestinal: Negative for heartburn and abdominal pain.  Genitourinary: Negative for dysuria.  Musculoskeletal: Negative for myalgias.  Neurological: Positive for focal weakness (reports improved left side strength ) and headaches (once a few days ago). Negative for dizziness, tingling and sensory change.  Psychiatric/Behavioral: Negative for depression.     Objective:  Physical Exam: Filed Vitals:   12/13/13 1320  BP: 148/94  Pulse: 71  Temp: 98.4 F (36.9 C)  TempSrc: Oral  SpO2: 96%   Physical Exam  Nursing note and vitals reviewed. Constitutional: He is oriented to person, place, and time and well-developed, well-nourished, and in no distress.  In power wheelchair  HENT:  Head: Normocephalic and atraumatic.  Cardiovascular: Normal rate, regular rhythm and normal heart sounds.   Pulmonary/Chest: Effort normal and breath sounds normal.  Abdominal: Soft. Bowel sounds are normal. He exhibits no distension. There is no tenderness.  Genitourinary:  Multiple flesh colored growths in groin area consistent appearcence with  condyloma.  Musculoskeletal: He exhibits no edema.  3+ to 4 strength of LUE, 3/5 LLE strength.    Neurological: He is alert and oriented to person, place, and time. No cranial nerve deficit.     Assessment & Plan:  Case discussed with Dr. Eppie Gibson See Problem Based Assessment and Plan Medications Ordered Meds ordered this encounter  Medications  . amLODipine (NORVASC) 5 MG tablet    Sig: Take 1 tablet (5 mg total) by mouth daily.    Dispense:  90 tablet    Refill:  3  . podofilox (CONDYLOX) 0.5 % gel    Sig: Apply topically 2 (two) times daily. Apply twice daily (morning and evening) for 3 consecutive days, then withhold use for 4 consecutive days; this cycle may be repeated up to 4 times until there is no visible wart tissue    Dispense:  3.5 g    Refill:  1   Other Orders No orders of the defined types were placed in this encounter.

## 2013-12-13 NOTE — Assessment & Plan Note (Signed)
BP Readings from Last 3 Encounters:  12/13/13 148/94  11/21/13 133/86  08/09/13 132/96    Lab Results  Component Value Date   NA 136 04/17/2013   K 4.8 04/17/2013   CREATININE 1.10 04/17/2013    Assessment: Blood pressure control: mildly elevated Progress toward BP goal:  deteriorated Comments: did not take AM BP meds  Plan: Medications:  D/C Metoprolol (no clear indication for BB, not completely BB anyway), Continue HCTZ and add Amlodpine 5mg  Educational resources provided: Public relations account executive tools provided:   Other plans: Adding Amlodipine and discontinuing Metoprolol.  Instructed patient and wife to continue to check BP daily to twice a day and will have them call me in 3-4 weeks with report of BP.  I will either increase amlodipine at that time, continue it or have patient in for a follow up visit.  Otherwise can follow up in 3 months.

## 2013-12-13 NOTE — Assessment & Plan Note (Signed)
-  Will try Condlyox for up to 4 weeks, given instructions for 3 days BID then 4 days off.

## 2013-12-13 NOTE — Patient Instructions (Addendum)
General Instructions: STOP taking Metoprolol (I am replaceing your medication with another medication)  START taking Amlodipine 5mg  a day.  Gradually stop taking Baclofen (take only 1 pill a day for 5 days then stop)  Keep check of your blood pressure. Call me in 3-4 weeks and let me know to call you back to see if blood pressure is controlled if not I will need to see you sooner.  For the Condlyox gel you can apply this twice a day for 3 days then stop for 4 days, this cycle can be repeated for UP TO 4 weeks.  Thank you for bringing your medicines today. This helps [place] keep you safe from mistakes.   Progress Toward Treatment Goals:  Treatment Goal 12/13/2013  Blood pressure deteriorated    Self Care Goals & Plans:  Self Care Goal 12/13/2013  Manage my medications take my medicines as prescribed; bring my medications to every visit; refill my medications on time  Monitor my health -  Eat healthy foods eat more vegetables; eat foods that are low in salt; eat baked foods instead of fried foods  Be physically active -  Meeting treatment goals -    No flowsheet data found.   Care Management & Community Referrals:  Referral 12/13/2013  Referrals made for care management support none needed  Referrals made to community resources -      Genital Warts Genital warts are a sexually transmitted infection. They may appear as small bumps on the tissues of the genital area. CAUSES  Genital warts are caused by a virus called human papillomavirus (HPV). HPV is the most common sexually transmitted disease (STD) and infection of the sex organs. This infection is spread by having unprotected sex with an infected person. It can be spread by vaginal, anal, and oral sex. Many people do not know they are infected. They may be infected for years without problems. However, even if they do not have problems, they can unknowingly pass the infection to their sexual partners. SYMPTOMS   Itching and  irritation in the genital area.  Warts that bleed.  Painful sexual intercourse. DIAGNOSIS  Warts are usually recognized with the naked eye on the vagina, vulva, perineum, anus, and rectum. Certain tests can also diagnose genital warts, such as:  A Pap test.  A tissue sample (biopsy) exam.  Colposcopy. A magnifying tool is used to examine the vagina and cervix. The HPV cells will change color when certain solutions are used. TREATMENT  Warts can be removed by:  Applying certain chemicals, such as cantharidin or podophyllin.  Liquid nitrogen freezing (cryotherapy).  Immunotherapy with Candida or Trichophyton injections.  Laser treatment.  Burning with an electrified probe (electrocautery).  Interferon injections.  Surgery. PREVENTION  HPV vaccination can help prevent HPV infections that cause genital warts and that cause cancer of the cervix. It is recommended that the vaccination be given to people between the ages 36 to 28 years old. The vaccine might not work as well or might not work at all if you already have HPV. It should not be given to pregnant women. HOME CARE INSTRUCTIONS   It is important to follow your caregiver's instructions. The warts will not go away without treatment. Repeat treatments are often needed to get rid of warts. Even after it appears that the warts are gone, the normal tissue underneath often remains infected.  Do not try to treat genital warts with medicine used to treat hand warts. This type of medicine is strong  and can burn the skin in the genital area, causing more damage.  Tell your past and current sexual partner(s) that you have genital warts. They may be infected also and need treatment.  Avoid sexual contact while being treated.  Do not touch or scratch the warts. The infection may spread to other parts of your body.  Women with genital warts should have a cervical cancer check (Pap test) at least once a year. This type of cancer is  slow-growing and can be cured if found early. Chances of developing cervical cancer are increased with HPV.  Inform your obstetrician about your warts in the event of pregnancy. This virus can be passed to the baby's respiratory tract. Discuss this with your caregiver.  Use a condom during sexual intercourse. Following treatment, the use of condoms will help prevent reinfection.  Ask your caregiver about using over-the-counter anti-itch creams. SEEK MEDICAL CARE IF:   Your treated skin becomes red, swollen, or painful.  You have a fever.  You feel generally ill.  You feel little lumps in and around your genital area.  You are bleeding or have painful sexual intercourse. MAKE SURE YOU:   Understand these instructions.  Will watch your condition.  Will get help right away if you are not doing well or get worse. Document Released: 01/30/2000 Document Revised: 06/18/2013 Document Reviewed: 08/10/2010 [facility] Patient Information 2015 [place], [place]. This information is not intended to replace advice given to you by your health care provider. Make sure you discuss any questions you have with your health care provider.  Amlodipine tablets What is this medicine? AMLODIPINE (am LOE di peen) is a calcium-channel blocker. It affects the amount of calcium found in your heart and muscle cells. This relaxes your blood vessels, which can reduce the amount of work the heart has to do. This medicine is used to lower high blood pressure. It is also used to prevent chest pain. This medicine may be used for other purposes; ask your health care provider or pharmacist if you have questions. COMMON BRAND NAME(S): Norvasc What should I tell my health care provider before I take this medicine? They need to know if you have any of these conditions: -heart problems like heart failure or aortic stenosis -liver disease -an unusual or allergic reaction to amlodipine, other medicines, foods, dyes, or  preservatives -pregnant or trying to get pregnant -breast-feeding How should I use this medicine? Take this medicine by mouth with a glass of water. Follow the directions on the prescription label. Take your medicine at regular intervals. Do not take more medicine than directed. Talk to your pediatrician regarding the use of this medicine in children. Special care may be needed. This medicine has been used in children as young as 6. Persons over 84 years old may have a stronger reaction to this medicine and need smaller doses. Overdosage: If you think you have taken too much of this medicine contact a poison control center or emergency room at once. NOTE: This medicine is only for you. Do not share this medicine with others. What if I miss a dose? If you miss a dose, take it as soon as you can. If it is almost time for your next dose, take only that dose. Do not take double or extra doses. What may interact with this medicine? -herbal or dietary supplements -local or general anesthetics -medicines for high blood pressure -medicines for prostate problems -rifampin This list may not describe all possible interactions. Give your health care  provider a list of all the medicines, herbs, non-prescription drugs, or dietary supplements you use. Also tell them if you smoke, drink alcohol, or use illegal drugs. Some items may interact with your medicine. What should I watch for while using this medicine? Visit your doctor or health care professional for regular check ups. Check your blood pressure and pulse rate regularly. Ask your health care professional what your blood pressure and pulse rate should be, and when you should contact him or her. This medicine may make you feel confused, dizzy or lightheaded. Do not drive, use machinery, or do anything that needs mental alertness until you know how this medicine affects you. To reduce the risk of dizzy or fainting spells, do not sit or stand up quickly,  especially if you are an older patient. Avoid alcoholic drinks; they can make you more dizzy. Do not suddenly stop taking amlodipine. Ask your doctor or health care professional how you can gradually reduce the dose. What side effects may I notice from receiving this medicine? Side effects that you should report to your doctor or health care professional as soon as possible: -allergic reactions like skin rash, itching or hives, swelling of the face, lips, or tongue -breathing problems -changes in vision or hearing -chest pain -fast, irregular heartbeat -swelling of legs or ankles Side effects that usually do not require medical attention (report to your doctor or health care professional if they continue or are bothersome): -dry mouth -facial flushing -nausea, vomiting -stomach gas, pain -tired, weak -trouble sleeping This list may not describe all possible side effects. Call your doctor for medical advice about side effects. You may report side effects to FDA at 1-800-FDA-1088. Where should I keep my medicine? Keep out of the reach of children. Store at room temperature between 59 and 86 degrees F (15 and 30 degrees C). Protect from light. Keep container tightly closed. Throw away any unused medicine after the expiration date. NOTE: This sheet is a summary. It may not cover all possible information. If you have questions about this medicine, talk to your doctor, pharmacist, or health care provider.  2015, Elsevier/Gold Standard. (2011-12-31 11:40:58)  Podofilox topical solution What is this medicine? PODOFILOX (po do FIL ox) is a medication used to remove genital warts. The topical solution should only be used to treat genital warts located on the outside skin of the genitals (i.e., penis or vagina). The solution should not be used to treat warts near the rectal area. This medicine may be used for other purposes; ask your health care provider or pharmacist if you have questions. COMMON  BRAND NAME(S): Condylox What should I tell my health care provider before I take this medicine? They need to know if you have any of these conditions: -an unusual or allergic reaction to podofilox, podophyllum resin, other medicines, foods, dyes or preservatives -pregnant or trying to get pregnant -breast-feeding How should I use this medicine? This medicine is for external use only on the skin. Do not take by mouth. The solution may be used on the outside (external) areas of the skin around the vagina or penis to remove warts. The solution should not be used in the area between the rectum and the genitals. The solution should only be used on the outside skin. It should not be used to treat warts that are inside the rectum, vagina, or penis. Do not use this medicine more often or for longer than directed. Do not use a larger dose than directed by your  health care professional. Wash hands before and after use. Read package directions carefully before using. Apply the medication to the specific wart as instructed by your physician. Apply the solution using the applicator provided or a cotton-tipped applicator. Applicators should not be re-used. Additionally, used applicators should not be dipped into the bottle to prevent contamination. After application, you should make sure that the medication is dry before normal, untreated skin comes into contact with the treated skin. This medicine can cause severe irritation of normal skin. If contact with normal skin occurs, immediately flush the area thoroughly with water. Talk to your pediatrician regarding the use of this medicine in children. Special care may be needed. Overdosage: If you think you have taken too much of this medicine contact a poison control center or emergency room at once. NOTE: This medicine is only for you. Do not share this medicine with others. What if I miss a dose? If you miss a dose, take it as soon as you can. If it is almost time  for your next dose, take only that dose. Do not take double or extra doses. What may interact with this medicine? Interactions are not expected. Do not use any other medicines on the affected area without asking your doctor or health care professional. This list may not describe all possible interactions. Give your health care provider a list of all the medicines, herbs, non-prescription drugs, or dietary supplements you use. Also tell them if you smoke, drink alcohol, or use illegal drugs. Some items may interact with your medicine. What should I watch for while using this medicine? Visit your doctor or health care professional for regular checks on your progress. This medicine is not a cure. New warts may develop during or after treatment. Tell your doctor or health care professional if your symptoms do not start to get better within one week. The weekly treatment course can be repeated up to 4 times. If the wart does not resolve in 4 weeks, a different treatment should be considered. If you are pregnant or think you might be pregnant, contact your doctor or health care professional. Sexual (genital, oral, anal) contact should be avoided while the medication is on the skin. The only way to prevent infecting others with the HPV virus (the virus that causes genital warts) is to avoid direct skin-to-skin contact. If warts are visible in the genital area, sexual contact should be avoided until the warts are treated. Experts advise that using latex condoms during sexual contact may reduce, but not entirely prevent, infecting others. Avoid contact with the eyes. If eye contact occurs, patients should immediately flush the eye with large quantities of water for 15 minutes and seek medical attention. This medicine contains alcohol and is flammable. Do not use near heat, open flame, or while smoking. What side effects may I notice from receiving this medicine? Side effects that you should report to your doctor  or health care professional as soon as possible: -bleeding, blistering, burning, crusting, or scabbing of treated skin -blood in the urine -dizziness -severe skin rash or swelling -vomiting (may indicate excessive dosage) Side effects that usually do not require medical attention (report to your doctor or health care professional if they continue or are bothersome): -dryness, flaking or peeling of the skin -headache -mild redness, itching or stinging of the skin This list may not describe all possible side effects. Call your doctor for medical advice about side effects. You may report side effects to FDA at 1-800-FDA-1088. Where  should I keep my medicine? Keep out of the reach of children. Store at room temperature between 15 to 30 degrees C (59 to 86 degrees F). Do not freeze. Keep container tightly closed. This medicine contains alcohol and is flammable. Do not store near heat or open flame. Throw away any unused medicine after the expiration date. NOTE: This sheet is a summary. It may not cover all possible information. If you have questions about this medicine, talk to your doctor, pharmacist, or health care provider.  2015, Elsevier/Gold Standard. (2007-09-04 16:13:34)

## 2013-12-13 NOTE — Assessment & Plan Note (Signed)
Stable LDL at goal -Continue Atorvastatin 40mg .

## 2013-12-13 NOTE — Assessment & Plan Note (Signed)
Patient no longer with spacticity and continues to have some improvement of strength,  Dr. Naaman Plummer has referred to neuro rehab but he has yet to be contact.  He has not stopped taking baclofen.  I have reiterated Dr. Charm Barges instructions to slowly discontinue baclofen. - Continue Tramadol for PRN pain.

## 2013-12-14 ENCOUNTER — Ambulatory Visit: Payer: BC Managed Care – PPO | Admitting: Nurse Practitioner

## 2013-12-17 ENCOUNTER — Ambulatory Visit: Payer: BC Managed Care – PPO | Admitting: Nurse Practitioner

## 2014-01-01 ENCOUNTER — Telehealth: Payer: Self-pay | Admitting: *Deleted

## 2014-01-01 NOTE — Telephone Encounter (Signed)
BP readings looked good.  I called Jeremy [name] and told him to continue the Amlodipine 5mg  +HCTZ.  He has an appointment to follow up in Feb.

## 2014-01-01 NOTE — Telephone Encounter (Signed)
Pt's wife reported BP readings for this month:  123/81,  133/83, 134/89,  131/86, 129/70, and 117/82

## 2014-01-07 ENCOUNTER — Telehealth: Payer: Self-pay | Admitting: *Deleted

## 2014-01-07 NOTE — Telephone Encounter (Signed)
Talked with wife - while on Norvasc had rash in groin area and bleeding where thighs touched. Stopped med - no further problems. Did not know if he should go back on  previous med. Would like to talk with Dr Heber [place]. Hilda Blades Chace Bisch RN 01/07/14 4:45PM

## 2014-01-08 NOTE — Telephone Encounter (Signed)
Returned call to patient and wife. They report he developed a skin rash in his groin while on amlodipine which resolved after discontinuation. Dermatitis can potentially be cause by amlodipine.  Instructed patient to continue off this medication and he will resume Metoprolol 50mg  BID until our next visit. I have added amlodipine as an allergy for him.  Lucious Groves, DO

## 2014-01-14 ENCOUNTER — Encounter: Payer: Self-pay | Admitting: Occupational Therapy

## 2014-01-14 ENCOUNTER — Encounter: Payer: Self-pay | Admitting: Physical Therapy

## 2014-01-14 ENCOUNTER — Ambulatory Visit: Payer: BC Managed Care – PPO | Admitting: Occupational Therapy

## 2014-01-14 ENCOUNTER — Ambulatory Visit: Payer: BC Managed Care – PPO | Attending: Physical Medicine & Rehabilitation | Admitting: Physical Therapy

## 2014-01-14 VITALS — BP 142/87

## 2014-01-14 DIAGNOSIS — IMO0002 Reserved for concepts with insufficient information to code with codable children: Secondary | ICD-10-CM

## 2014-01-14 DIAGNOSIS — Z5189 Encounter for other specified aftercare: Secondary | ICD-10-CM | POA: Insufficient documentation

## 2014-01-14 DIAGNOSIS — R269 Unspecified abnormalities of gait and mobility: Secondary | ICD-10-CM | POA: Insufficient documentation

## 2014-01-14 DIAGNOSIS — R278 Other lack of coordination: Secondary | ICD-10-CM | POA: Insufficient documentation

## 2014-01-14 DIAGNOSIS — I6930 Unspecified sequelae of cerebral infarction: Secondary | ICD-10-CM | POA: Diagnosis not present

## 2014-01-14 DIAGNOSIS — G8191 Hemiplegia, unspecified affecting right dominant side: Secondary | ICD-10-CM

## 2014-01-14 NOTE — Therapy (Signed)
Physical Therapy Evaluation  Patient Details  Name: Jeremy [name] MRN: [id number] Date of Birth: 05/17/1965  Encounter Date: 01/14/2014      PT End of Session - 01/14/14 1639    Visit Number 1   Number of Visits 17   Date for PT Re-Evaluation 02/13/14   Authorization Type [employer] no visit limit no auth required   PT Start Time 1536   PT Stop Time 1413   PT Time Calculation (min) 1357 min   Equipment Utilized During Treatment Gait belt   Activity Tolerance Patient tolerated treatment well      Past Medical History  Diagnosis Date  . Hypertension 10/01/2011    basal ganglia  . Stroke   . High cholesterol     Past Surgical History  Procedure Laterality Date  . Tracheostomy tube placement  10/11/2011    Procedure: TRACHEOSTOMY;  Surgeon: Melida Quitter, MD;  Location: [facility];  Service: ENT;  Laterality: N/A;    BP 142/87 mmHg  Visit Diagnosis:  History of CVA with residual deficit - Plan: PT plan of care cert/re-cert      Subjective Assessment - 01/14/14 1543    Symptoms pt. had basal ganglia CVA on 10-01-11; pt. presents to OP PT in power wheelchair which he received in June 2015   Pertinent History R CVA in August 2013; pt. received OP PT in May 2014 to address mobility and strength deficits   Currently in Pain? No/denies          [facility] PT Assessment - 01/14/14 1624    Assessment   Medical Diagnosis R CVA   Onset Date --  August 2013   Prior Therapy Summer 2014   Balance Screen   Has the patient fallen in the past 6 months No   Has the patient had a decrease in activity level because of a fear of falling?  Yes   Is the patient reluctant to leave their home because of a fear of falling?  No   Home Environment   Living Enviornment Private residence   Living Arrangements Spouse/significant other   Type of [place] entrance   [place] One level   Functional Tests   Functional tests Sit to Stand  wheelchair to parallel bars with SBA   Transfers   Transfers Squat Pivot Transfers  wheelchair to/from mat   Ambulation/Gait   Ambulation/Gait Yes   Ambulation/Gait Assistance --  pt. stood 1" x 3 reps in bars; declined ambulation attempt   Balance   Balance Assessed Yes   Static Standing Balance   Static Standing - Balance Support Bilateral upper extremity supported   Static Standing - Level of Assistance 5: Stand by assistance  in parallel bars   Static Standing - Comment/# of Minutes 3 total              PT Short Term Goals - 01/14/14 1652    PT SHORT TERM GOAL #1   Title Assess gait with L KAFO on LLE with RW or hemiwalker and establish goal as appropriate.   Baseline 02-13-14   Time 4   Period Weeks   Status New   PT SHORT TERM GOAL #2   Title Pt. will stand 5" with UE support as needed with CGA with assist. device   Baseline 02-13-14   Time 4   Period Weeks   Status New   PT SHORT TERM GOAL #3   Title Independent in HEP for LLE strengthening.  Baseline 02-13-14   Time 4   Period Weeks   Status New   PT SHORT TERM GOAL #4   Title Assess TUG with RW   Baseline 02-13-14   Time 4   Period Weeks   Status New          PT Long Term Goals - 01/14/14 1652    PT LONG TERM GOAL #1   Title Amb. 60' with KAFO on LLE with min. assist with RW for incr. household accessibility.   Baseline 03-15-14   Time 8   Period Weeks   Status New   PT LONG TERM GOAL #2   Title Improve TUG by 4 secs to demo incr. functional mobility (with use of RW)   Baseline 03-15-14   Time 8   Period Weeks   Status New   PT LONG TERM GOAL #3   Title Don and doff KAFO with min. assist.   Baseline 03-15-14   Time 8   Period --   Status New   PT LONG TERM GOAL #4   Title Independent in updated HEP for LLE strengthening exercises.   Baseline 03-15-14   Time 8   Period Weeks   Status New          Plan - 01/14/14 1641    Clinical Impression Statement pt. presents to PT with dense L hemiparesis due to CVA sustained in  Aug. 2013; pt. using a power wheelchair   Pt will benefit from skilled therapeutic intervention in order to improve on the following deficits Abnormal gait;Decreased coordination;Decreased endurance;Decreased activity tolerance;Decreased balance;Decreased strength;Decreased mobility   Rehab Potential Good   PT Frequency 2x / week   PT Duration 8 weeks   PT Treatment/Interventions Therapeutic activities;Patient/family education;Therapeutic exercise;Gait training;Balance training;Stair training;Neuromuscular re-education;Functional mobility training   PT Next Visit Plan pt. to bring KAFO for LLE - assess gait with KAFO   Consulted and Agree with Plan of Care Patient;Family member/caregiver        Problem List Patient Active Problem List   Diagnosis Date Noted  . Genital warts 12/13/2013  . Hyperlipidemia 03/01/2012  . Routine health maintenance 02/03/2012  . Weakness of left side of body 12/14/2011  . Greater trochanteric bursitis 12/14/2011  . ICH (intracerebral hemorrhage) 10/01/2011  . Essential hypertension, benign 10/01/2011                                              Alda Lea 01/14/2014, 5:01 PM      Guido Sander, [place] [address] [place], [place] [address] [phone]

## 2014-01-14 NOTE — Therapy (Addendum)
Occupational Therapy Evaluation  Patient Details  Name: Jeremy [name] MRN: [id number] Date of Birth: 08-31-1965  Encounter Date: 01/14/2014      OT End of Session - 01/14/14 1537    Visit Number 1   Number of Visits 16   OT Start Time 0254   OT Stop Time 2706   OT Time Calculation (min) 46 min   Activity Tolerance Patient tolerated treatment well      Past Medical History  Diagnosis Date  . Hypertension 10/01/2011    basal ganglia  . Stroke   . High cholesterol     Past Surgical History  Procedure Laterality Date  . Tracheostomy tube placement  10/11/2011    Procedure: TRACHEOSTOMY;  Surgeon: Melida Quitter, MD;  Location: [place];  Service: ENT;  Laterality: N/A;    There were no vitals taken for this visit.  Visit Diagnosis:  Left hemiplegia - OT cert care of plan Lack of coordination due to stroke      Subjective Assessment - 01/14/14 1536    Symptoms "I want my arm to work better"   Pertinent History see epic snapshot   Currently in Pain? No/denies          [facility] OT Assessment - 01/14/14 0001    Assessment   Diagnosis R CVA   Onset Date --  May 2013   Prior Therapy Pt had both inpatient and outpatient therapies   Balance Screen   Has the patient fallen in the past 6 months No   Home  Environment   Family/patient expects to be discharged to: Private residence   Type of [place] One level   Alternate Level Stairs - Number of Steps --  ramp   Bathroom Accessibility Yes   Prior Function   Level of Independence Independent with basic ADLs;Independent with homemaking with ambulation   ADL   Eating/Feeding Minimal assistance   Eating/Feeding Patient Percentage --  needs assist wtih cutting   Grooming Modified independent   Upper Body Bathing Moderate assistance   Lower Body Bathing Moderate assistance   Lower Body Bathing Patient Percentage --  pt takes sponge bath in bed with wife's assistance   Upper Body Dressing Increased time    Lower Body Dressing Modified independent  pt uses hospital bed to don socks and shoes.   Materials engineer Modified independent  sliding board to 3 in1 mod I   Toileting - Conservation officer, historic buildings --  N/A   ADL comments manual wc, power chair, sliding board, 3 in 1,    IADL   Light Housekeeping Does not participate in any housekeeping tasks   Meal Prep Needs to have meals prepared and served  Pt likes to cook and wishes to return to this   Medication Management Is not capable of dispensing or managing own medication  wife assists due to memory deficits   Financial Management Dependent   Mobility   Mobility Status --  pt uses power chair at home and in community   Written Expression   Dominant Hand Right   Vision Assessment   Comment difficulty reading small print    Activity Tolerance   Activity Tolerance Tolerates 10-20 min activity with muiltiple rests   Cognition   Attention Selective  impaired   Memory Impaired   Memory Impairment --  wife states pt forgets meds, some daily events   Problem Solving Impaired  Sensation   Light Touch Impaired by gross assessment  absent from shoulder to hand   Hot/Cold Not tested  pt unable to remember if he can feel temperature.   Proprioception Impaired by gross assessment   Coordination   Gross Motor Movements are Fluid and Coordinated No   Fine Motor Movements are Fluid and Coordinated No   Box and Blocks L= 15   Tremors ataxia in LUE   Coordination 9 hole R= 25.10  L unable to pick up pegs   Edema   Edema wife states fingers swell sometimes   Tone   Assessment Location Left Upper Extremity  WFL   AROM   Overall AROM  Deficits  R sh flexion to about 130 with pain 2/10   Overall AROM Comments Limited in sh flexion (130) external rotation, supination   PROM   Overall PROM Comments limited in sh flexion to 130, external rotation   Hand Function    Comments r grasp= 95 lbs  L grasp= 35 pounds.     LUE Tone   LUE Tone Mild;Hypertonic  spasticity              OT Short Term Goals - 01/14/14 1542    OT SHORT TERM GOAL #1   Title Pt and wife will be I with HEP - 02/11/2014   Status New   OT SHORT TERM GOAL #2   Title Pt will demonstrate increased hand strength by 5 pounds in left hand to assst with functional activities.   Status New   OT SHORT TERM GOAL #3   Title Pt will demonstrate ability for 135 degrees of shoulder flexion for overhead reach in functional activities   Status New   OT SHORT TERM GOAL #4   Title Pt will demonstrate the ability to use LUE as gross assist with basic ADL tasks   Status New          OT Long Term Goals - 01/14/14 1545    OT LONG TERM GOAL #1   Title Pt and wife will be I with upgraded HEP  prn - 02/16/23/2016   Status New   OT LONG TERM GOAL #2   Title Pt will demonstrate increased hand strength by 7 pounds to assist with functional tasks   Status New   OT LONG TERM GOAL #3   Title Pt will demonstrate ability to pick up small objects with min a from table during functional tasks   Status New   OT LONG TERM GOAL #4   Title Pt will demonstrate ability for shoulder flexion to 140 degress to assist with functional tasks   Status New   OT LONG TERM GOAL #5   Title Pt will be min a with bathing at a bed level   Status New          Plan - 01/14/14 1537    Clinical Impression Statement Pt is a 48 year old male s/p L CVA in 2013 who has recently seen his MD and was referred for therapy for L hemiplegia.  Patient presents with the following deficits:  decreased AROM/PROM in LUE, decreased strength, decreased coordination, ataxia, decreased functional use of LUE.  These deficits impede the patient's ability to complete ADLs, IADL's and leisure activities.    Rehab Potential Good   Clinical Impairments Affecting Rehab Potential Pt will benefit from skilled OT to address the following:   decrease AROM, decreased strength, decreased coordination, pain in shoulder, decreased activity tolerance, pt/family education.  OT Frequency 2x / week   OT Duration 8 weeks   OT Treatment/Interventions Self-care/ADL training;Moist Heat;Therapeutic exercise;Neuromuscular education;Manual Therapy;Functional Mobility Training;DME and/or AE instruction;Passive range of motion;Therapeutic activities;Patient/family education;Balance training        Problem List Patient Active Problem List   Diagnosis Date Noted  . Genital warts 12/13/2013  . Hyperlipidemia 03/01/2012  . Routine health maintenance 02/03/2012  . Weakness of left side of body 12/14/2011  . Greater trochanteric bursitis 12/14/2011  . ICH (intracerebral hemorrhage) 10/01/2011  . Essential hypertension, benign 10/01/2011                                              Forde Radon, MS, OTR/L 01/14/2014 4:44 PM Phone: [phone] Fax: [phone]  [name] 01/14/2014, 4:44 PM

## 2014-01-17 ENCOUNTER — Encounter: Payer: Self-pay | Admitting: Physical Therapy

## 2014-01-17 ENCOUNTER — Ambulatory Visit: Payer: BC Managed Care – PPO | Attending: Physical Medicine & Rehabilitation | Admitting: Physical Therapy

## 2014-01-17 ENCOUNTER — Encounter: Payer: Self-pay | Admitting: Occupational Therapy

## 2014-01-17 ENCOUNTER — Ambulatory Visit: Payer: BC Managed Care – PPO | Admitting: Occupational Therapy

## 2014-01-17 DIAGNOSIS — I6930 Unspecified sequelae of cerebral infarction: Secondary | ICD-10-CM | POA: Insufficient documentation

## 2014-01-17 DIAGNOSIS — G8194 Hemiplegia, unspecified affecting left nondominant side: Secondary | ICD-10-CM

## 2014-01-17 DIAGNOSIS — IMO0002 Reserved for concepts with insufficient information to code with codable children: Secondary | ICD-10-CM

## 2014-01-17 DIAGNOSIS — R278 Other lack of coordination: Secondary | ICD-10-CM | POA: Diagnosis not present

## 2014-01-17 DIAGNOSIS — Z5189 Encounter for other specified aftercare: Secondary | ICD-10-CM | POA: Insufficient documentation

## 2014-01-17 DIAGNOSIS — R269 Unspecified abnormalities of gait and mobility: Secondary | ICD-10-CM

## 2014-01-17 NOTE — Therapy (Signed)
[facility] [address] [place], [place], [address] Phone: [phone]   Fax:  [phone]  Occupational Therapy Treatment  Patient Details  Name: Jeremy [name] MRN: [id number] Date of Birth: Mar 14, 1965  Encounter Date: 01/17/2014      OT End of Session - 01/17/14 1508    Visit Number 2   Number of Visits 16   OT Start Time 1400   OT Stop Time 1445   OT Time Calculation (min) 45 min   Equipment Utilized During Treatment gait belt, KAFO, power wheelchair   Activity Tolerance Patient tolerated treatment well   Behavior During Therapy [facility] for tasks assessed/performed      Past Medical History  Diagnosis Date  . Hypertension 10/01/2011    basal ganglia  . Stroke   . High cholesterol     Past Surgical History  Procedure Laterality Date  . Tracheostomy tube placement  10/11/2011    Procedure: TRACHEOSTOMY;  Surgeon: Melida Quitter, MD;  Location: [place];  Service: ENT;  Laterality: N/A;    There were no vitals taken for this visit.  Visit Diagnosis:  Hemiplegia affecting left nondominant side  Lack of coordination due to stroke      Subjective Assessment - 01/17/14 1457    Symptoms Patient indicates lack of sensation, and stiffness in left arm.  Patient indicates a desire for more functional use of left arm.     Pertinent History see epic snapshot   Currently in Pain? Yes   Pain Score 2    Pain Location Shoulder   Pain Orientation Left   Pain Descriptors / Indicators Aching  stretching   Pain Type Chronic pain   Pain Frequency Occasional   Aggravating Factors  End range during passive or active stretch   Pain Relieving Factors rest, reposition            OT Treatments/Exercises (OP) - 01/17/14 0001    Transfers   Transfers Sit to Stand;Stand to Sit   Sit to Stand 4: Min assist  emphasis on weight shift to left   Stand to Sit 4: Min assist  encourage activation of LLE   ADLs   Eating encouraged muscle  grading to drink from styrofoam cup   UB Dressing don front opening jacket with mod I   Neurological Re-education Exercises   Scapular Stabilization Left;10 reps;Supine   Shoulder Flexion AROM;AAROM;Strengthening;Left;10 reps;Supine   Shoulder Protraction AROM;Left;10 reps;Strengthening;Supine   Elbow Extension AROM;Strengthening;10 reps;Supine;Seated   Weight Bearing Technique   Weight Bearing Technique Yes  Sidelying to sidesit - left          OT Education - 01/17/14 1508    Education provided Yes   Person(s) Educated Patient;Spouse   Methods Explanation;Demonstration   Comprehension Verbalized understanding;Verbal cues required          OT Short Term Goals - 01/17/14 1528    OT SHORT TERM GOAL #1   Title Pt and wife will be I with HEP - 02/11/2014   OT SHORT TERM GOAL #2   Title Pt will demonstrate increased hand strength by 5 pounds in left hand to assst with functional activities.   OT SHORT TERM GOAL #3   Title Pt will demonstrate ability for 135 degrees of shoulder flexion for overhead reach in functional activities   OT SHORT TERM GOAL #4   Title Pt will demonstrate the ability to use LUE as gross assist with basic ADL tasks  OT Long Term Goals - 01/17/14 1528    OT LONG TERM GOAL #1   Title Pt and wife will be I with upgraded HEP  prn - 02/16/23/2016   OT LONG TERM GOAL #2   Title Pt will demonstrate increased hand strength by 7 pounds to assist with functional tasks   OT LONG TERM GOAL #3   Title Pt will demonstrate ability to pick up small objects with min a from table during functional tasks   OT LONG TERM GOAL #4   Title Pt will demonstrate ability for shoulder flexion to 140 degress to assist with functional tasks   OT LONG TERM GOAL #5   Title Pt will be min a with bathing at a bed level          Plan - 01/17/14 1509    Clinical Impression Statement Patient seen today for skilled OT intervention to address joint mobility in left shoulder,  as wella s neuromuscular re-education to address muscle balnce, coordination and muscle grading during basic functional activities.     Rehab Potential Good   Clinical Impairments Affecting Rehab Potential Pt will benefit from skilled OT to address the following:  decrease AROM, decreased strength, decreased coordination, pain in shoulder, decreased activity tolerance, pt/family education.   OT Treatment/Interventions Self-care/ADL training;Moist Heat;Therapeutic exercise;Neuromuscular education;Manual Therapy;Functional Mobility Training;DME and/or AE instruction;Passive range of motion;Therapeutic activities;Patient/family education;Balance training;Therapeutic exercises   Plan Weight bearing through left arm / active stretching for shoulder, and forearm.  Encourage muscle balance and grading muscle control, encourage visual attention to left hand with activity (decreased sensation)   OT Home Exercise Plan Initiatied trial of rolling supine to sidelying in bed with wife's supervision- please check if this was attempted at  home   Consulted and Agree with Plan of Care Patient                               Problem List Patient Active Problem List   Diagnosis Date Noted  . Genital warts 12/13/2013  . Hyperlipidemia 03/01/2012  . Routine health maintenance 02/03/2012  . Weakness of left side of body 12/14/2011  . Greater trochanteric bursitis 12/14/2011  . ICH (intracerebral hemorrhage) 10/01/2011  . Essential hypertension, benign 10/01/2011    Mariah Milling 01/17/2014, 3:31 PM

## 2014-01-17 NOTE — Therapy (Signed)
[facility] [address] [place], [place], [address] Phone: [phone]   Fax:  [phone]  Physical Therapy Treatment  Patient Details  Name: Jeremy [name] MRN: [id number] Date of Birth: 1965-08-08  Encounter Date: 01/17/2014      PT End of Session - 01/17/14 1732    Visit Number 2   Number of Visits 17   Date for PT Re-Evaluation 02/13/14   Authorization Type [employer] no visit limit no auth required   PT Start Time 1316   PT Stop Time 1400   PT Time Calculation (min) 44 min   Equipment Utilized During Treatment Gait belt   Activity Tolerance Patient tolerated treatment well      Past Medical History  Diagnosis Date  . Hypertension 10/01/2011    basal ganglia  . Stroke   . High cholesterol     Past Surgical History  Procedure Laterality Date  . Tracheostomy tube placement  10/11/2011    Procedure: TRACHEOSTOMY;  Surgeon: Melida Quitter, MD;  Location: [place];  Service: ENT;  Laterality: N/A;    There were no vitals taken for this visit.  Visit Diagnosis:  History of CVA with residual deficit  Abnormality of gait      Subjective Assessment - 01/17/14 1725    Symptoms Pt. states he hasn't had KAFO on left leg since October but reports standing some at home without brace   Currently in Pain? No/denies            [facility] Adult PT Treatment/Exercise - 01/17/14 1726    Ambulation/Gait   Ambulation/Gait Yes   Ambulation/Gait Assistance 4: Min guard   Ambulation Distance (Feet) 40 Feet  25' x1 2nd rep   Assistive device Rolling walker   Gait Pattern Decreased stance time - left  KAFO on LLE   Knee/Hip Exercises: Aerobic   Stationary Bike 5' level 1.2 scifit    Instructed in correct technique with sit to stand transfers - wife present Instructed wife in safe guarding technique to assist pt. With amb. In the home     PT Education - 01/17/14 1732    Education provided Yes   Education Details correct guarding  technique and need to amb. in home with wife's assistance   Person(s) Educated Patient;Spouse   Methods Explanation;Demonstration   Comprehension Verbalized understanding;Returned demonstration                                     Problem List Patient Active Problem List   Diagnosis Date Noted  . Genital warts 12/13/2013  . Hyperlipidemia 03/01/2012  . Routine health maintenance 02/03/2012  . Weakness of left side of body 12/14/2011  . Greater trochanteric bursitis 12/14/2011  . ICH (intracerebral hemorrhage) 10/01/2011  . Essential hypertension, benign 10/01/2011    Alda Lea 01/17/2014, 5:36 PM     Guido Sander, [place] [address] [place], [place] [address] [phone]

## 2014-01-17 NOTE — Patient Instructions (Signed)
Instructed pt. And wife in safe guard assist with ambulation in home. Pt. Instructed to amb. Approx. 54' with RW with wife's assistance and wife reports having gait belt at home. Tennis balls given for pt's RW. Also instructed in correct hand placement for sit to stand transfers from wheelchair.

## 2014-01-17 NOTE — Patient Instructions (Signed)
Supine to sidelying left.  Encouraged patient and wife to attempt at home. Stressed with patient's wife this was only safe to try in a supervised setting as patient has narrow bed at home, and is fearful of falling with rolling activities.    Left arm placed at 60 degrees of shoulder abduction - patient supine. Patient rolls to left to accept weight through left side of body.   Patient touches fingertips of right hand to left hand. Patient returns to supine while maintaining left arm in abducted position.

## 2014-01-21 ENCOUNTER — Encounter: Payer: Self-pay | Admitting: Physical Therapy

## 2014-01-21 ENCOUNTER — Ambulatory Visit: Payer: BC Managed Care – PPO | Admitting: Occupational Therapy

## 2014-01-21 ENCOUNTER — Ambulatory Visit: Payer: BC Managed Care – PPO | Admitting: Physical Therapy

## 2014-01-21 ENCOUNTER — Ambulatory Visit: Payer: BC Managed Care – PPO | Admitting: Physical Medicine & Rehabilitation

## 2014-01-21 ENCOUNTER — Encounter: Payer: Self-pay | Admitting: Occupational Therapy

## 2014-01-21 DIAGNOSIS — IMO0002 Reserved for concepts with insufficient information to code with codable children: Secondary | ICD-10-CM

## 2014-01-21 DIAGNOSIS — G8191 Hemiplegia, unspecified affecting right dominant side: Secondary | ICD-10-CM

## 2014-01-21 DIAGNOSIS — R269 Unspecified abnormalities of gait and mobility: Secondary | ICD-10-CM

## 2014-01-21 DIAGNOSIS — Z5189 Encounter for other specified aftercare: Secondary | ICD-10-CM | POA: Diagnosis not present

## 2014-01-21 NOTE — Therapy (Signed)
[facility] [address] [place], [place], [address] Phone: [phone]   Fax:  [phone]  Occupational Therapy Treatment  Patient Details  Name: Jeremy [name] MRN: [id number] Date of Birth: May 12, 1965  Encounter Date: 01/21/2014      OT End of Session - 01/21/14 1615    Visit Number 3   Number of Visits 16   OT Start Time 1529   OT Stop Time 7616   OT Time Calculation (min) 44 min   Activity Tolerance Patient tolerated treatment well      Past Medical History  Diagnosis Date  . Hypertension 10/01/2011    basal ganglia  . Stroke   . High cholesterol     Past Surgical History  Procedure Laterality Date  . Tracheostomy tube placement  10/11/2011    Procedure: TRACHEOSTOMY;  Surgeon: Melida Quitter, MD;  Location: [place];  Service: ENT;  Laterality: N/A;    There were no vitals taken for this visit.  Visit Diagnosis:  No diagnosis found.      Subjective Assessment - 01/21/14 1530    Symptoms I have no pain   Pertinent History see epic snapshot   Currently in Pain? No/denies            OT Treatments/Exercises (OP) - 01/21/14 1613    Exercises   Exercises Hand   Hand Exercises   Theraputty Flatten;Roll;Grip;Pinch  HEP given to patient and wife with red putty   Hand Gripper with Medium Beads 35 pounds of resistance with small blocks          OT Education - 01/21/14 1602    Education provided Yes   Education Details HEP with red putty   Person(s) Educated Patient;Spouse   Methods Explanation;Demonstration;Handout   Comprehension Verbalized understanding;Returned demonstration          OT Short Term Goals - 01/21/14 1602    OT SHORT TERM GOAL #1   Title Pt and wife will be I with HEP - 02/11/2014   OT SHORT TERM GOAL #2   Title Pt will demonstrate increased hand strength by 5 pounds in left hand to assst with functional activities.   OT SHORT TERM GOAL #3   Title Pt will demonstrate ability for  135 degrees of shoulder flexion for overhead reach in functional activities   OT SHORT TERM GOAL #4   Title Pt will demonstrate the ability to use LUE as gross assist with basic ADL tasks          OT Long Term Goals - 01/21/14 1602    OT LONG TERM GOAL #1   Title Pt and wife will be I with upgraded HEP  prn - 02/16/23/2016   OT LONG TERM GOAL #2   Title Pt will demonstrate increased hand strength by 7 pounds to assist with functional tasks   OT LONG TERM GOAL #3   Title Pt will demonstrate ability to pick up small objects with min a from table during functional tasks   OT LONG TERM GOAL #4   Title Pt will demonstrate ability for shoulder flexion to 140 degress to assist with functional tasks   OT LONG TERM GOAL #5   Title Pt will be min a with bathing at a bed level          Plan - 01/21/14 1616    Clinical Impression Statement Pt provided with HEP with red putty. Wife observed and has agreed to provide cues and assistance as needed.  Rehab Potential Good   Clinical Impairments Affecting Rehab Potential Pt will benefit from skilled OT to address the following:  decrease AROM, decreased strength, decreased coordination, pain in shoulder, decreased activity tolerance, pt/family education.   OT Frequency 2x / week   OT Duration 8 weeks   OT Treatment/Interventions Self-care/ADL training;Moist Heat;Therapeutic exercise;Neuromuscular education;Manual Therapy;Functional Mobility Training;DME and/or AE instruction;Passive range of motion;Therapeutic activities;Patient/family education;Balance training;Therapeutic exercises                               Problem List Patient Active Problem List   Diagnosis Date Noted  . Genital warts 12/13/2013  . Hyperlipidemia 03/01/2012  . Routine health maintenance 02/03/2012  . Weakness of left side of body 12/14/2011  . Greater trochanteric bursitis 12/14/2011  . ICH (intracerebral hemorrhage) 10/01/2011  . Essential  hypertension, benign 10/01/2011   Forde Radon, MS, OTR/L 01/21/2014 4:17 PM Phone: [phone] Fax: [phone]   [name] 01/21/2014, 4:17 PM

## 2014-01-21 NOTE — Patient Instructions (Signed)
Therputty exercises for Home Program using RED putty  Gross Opposition 1. Make a ball 2. Make a pancake 3.  Make a cone Repeat all of these 3 times  Gross Finger Flexion 4,  Make a short fat hot dog and squeeze Do 5-10 times  Intrinsic Function 5.  Pull Taffy Do 10 times  Gross Finger and Wrist Extension/Pinch 6.  Make a hot dog.  Pinch using index, middle and thumb.  Stop if you get pain and let your therapist know.  It is ok to rest if your hand gets tired.

## 2014-01-21 NOTE — Therapy (Signed)
[facility] [address] [place], [place], [address] Phone: [phone]   Fax:  [phone]  Physical Therapy Treatment  Patient Details  Name: Jeremy [name] MRN: [id number] Date of Birth: [birth year]  Encounter Date: 01/21/2014      PT End of Session - 01/21/14 1516    Visit Number 3   Number of Visits 17   Date for PT Re-Evaluation 02/13/14   Authorization Type BCBS no visit limit no auth required   PT Start Time 1445   PT Stop Time 1526   PT Time Calculation (min) 41 min   Equipment Utilized During Treatment Gait belt   Activity Tolerance Patient tolerated treatment well   Behavior During Therapy [facility] for tasks assessed/performed      Past Medical History  Diagnosis Date  . Hypertension 10/01/2011    basal ganglia  . Stroke   . High cholesterol     Past Surgical History  Procedure Laterality Date  . Tracheostomy tube placement  10/11/2011    Procedure: TRACHEOSTOMY;  Surgeon: Melida Quitter, MD;  Location: [place];  Service: ENT;  Laterality: N/A;    There were no vitals taken for this visit.  Visit Diagnosis:  Abnormality of gait  Right hemiplegia  Lack of coordination due to stroke      Subjective Assessment - 01/21/14 1449    Symptoms No new complaints. Reports no pain or new falls. Walked 2 times over weekend with spouse and RW/brace.   Pertinent History R CVA in August 2013; pt. received OP PT in May 2014 to address mobility and strength deficits   Currently in Pain? No/denies            [facility] Adult PT Treatment/Exercise - 01/21/14 1450    Transfers   Sit to Stand 4: Min guard;With upper extremity assist;From chair/3-in-1   Sit to Stand Details (indicate cue type and reason) cues to scoot forward and on left foot placement before standing.   Stand to Sit 4: Min guard;With upper extremity assist;To chair/3-in-1   Stand to Sit Details cues to ensure right leg all the way back and at surface prior to  reaching down to unlock KAFO on left leg.                        Ambulation/Gait   Ambulation/Gait Yes   Ambulation/Gait Assistance 4: Min guard;4: Min assist   Ambulation/Gait Assistance Details cues on posture and walker position with gait.  standing rest breaks needed throughout both gait trials.   Ambulation Distance (Feet) 40 Feet  x 2. 20 feet x 1 rep   Assistive device Rolling walker   Gait Pattern Step-through pattern;Decreased stride length;Decreased stance time - left;Trunk flexed   Knee/Hip Exercises: Aerobic   Stationary Bike Scifit x 4 extremeties L1.2 x 8 minutes with goal RPM >20 for strengthening and activity tolerance.                               PT Short Term Goals - 01/21/14 1518    PT SHORT TERM GOAL #1   Title Assess gait with L KAFO on LLE with RW or hemiwalker and establish goal as appropriate.   Baseline 02-13-14   Time 4   Period Weeks   Status Achieved  see LTG   PT SHORT TERM GOAL #2   Title Pt. will stand 5" with UE support as needed with  CGA with assist. device   Baseline 02-13-14   Time 4   Period Weeks   Status On-going   PT SHORT TERM GOAL #3   Title Independent in HEP for LLE strengthening.   Baseline 02-13-14   Time 4   Period Weeks   Status On-going   PT SHORT TERM GOAL #4   Title Assess TUG with RW   Baseline 02-13-14   Time 4   Period Weeks   Status Achieved  see LTG          PT Long Term Goals - 01/21/14 1519    PT LONG TERM GOAL #1   Title Amb. 38' with KAFO on LLE with min. assist with RW for incr. household accessibility.   Baseline 03-15-14   Time 8   Period Weeks   Status On-going   PT LONG TERM GOAL #2   Title Improve TUG by 4 secs to demo incr. functional mobility (with use of RW)   Baseline 03-15-14   Time 8   Period Weeks   Status On-going   PT LONG TERM GOAL #3   Title Don and doff KAFO with min. assist.   Baseline 03-15-14   Time 8   Status On-going   PT LONG TERM GOAL #4   Title Independent in  updated HEP for LLE strengthening exercises.   Baseline 03-15-14   Time 8   Period Weeks   Status On-going          Plan - 01/21/14 1516    Clinical Impression Statement Pt making steady progress, was able to increase gait distance this visit. Fatique quickly and does need increased time with all movement/mobility. Also needed 3-4 standing rest breaks with each gait trail due to pt reported fatique.   Pt will benefit from skilled therapeutic intervention in order to improve on the following deficits Abnormal gait;Decreased coordination;Decreased endurance;Decreased activity tolerance;Decreased balance;Decreased strength;Decreased mobility   Rehab Potential Good   PT Frequency 2x / week   PT Duration 8 weeks   PT Treatment/Interventions Therapeutic activities;Patient/family education;Therapeutic exercise;Gait training;Balance training;Stair training;Neuromuscular re-education;Functional mobility training   PT Next Visit Plan Continue with gait with bracke/RW.  Add balance and strengthening activites as well.    Consulted and Agree with Plan of Care Patient            Problem List Patient Active Problem List   Diagnosis Date Noted  . Genital warts 12/13/2013  . Hyperlipidemia 03/01/2012  . Routine health maintenance 02/03/2012  . Weakness of left side of body 12/14/2011  . Greater trochanteric bursitis 12/14/2011  . ICH (intracerebral hemorrhage) 10/01/2011  . Essential hypertension, benign 10/01/2011    Willow Ora 01/21/2014, 3:34 PM  Willow Ora, PTA, [facility] [address], [place], [place] [address] [phone] 01/21/2014, 3:37 PM

## 2014-01-23 ENCOUNTER — Telehealth: Payer: Self-pay | Admitting: *Deleted

## 2014-01-23 NOTE — Telephone Encounter (Signed)
Called pt back, verbally explained his appt is 03/08/2014 11:20 am

## 2014-01-23 NOTE — Telephone Encounter (Signed)
Pt called asking what date and what time his upcoming appt is

## 2014-01-24 ENCOUNTER — Ambulatory Visit: Payer: BC Managed Care – PPO | Admitting: Physical Therapy

## 2014-01-24 ENCOUNTER — Encounter: Payer: BC Managed Care – PPO | Admitting: Occupational Therapy

## 2014-01-28 ENCOUNTER — Other Ambulatory Visit: Payer: Self-pay | Admitting: *Deleted

## 2014-01-28 DIAGNOSIS — E785 Hyperlipidemia, unspecified: Secondary | ICD-10-CM

## 2014-01-28 MED ORDER — ATORVASTATIN CALCIUM 40 MG PO TABS: 40.0000 mg | ORAL_TABLET | Freq: Every day | ORAL | Status: DC

## 2014-01-29 ENCOUNTER — Encounter: Payer: Self-pay | Admitting: Occupational Therapy

## 2014-01-29 ENCOUNTER — Encounter: Payer: Self-pay | Admitting: Physical Therapy

## 2014-01-29 ENCOUNTER — Ambulatory Visit: Payer: BC Managed Care – PPO | Admitting: Physical Therapy

## 2014-01-29 ENCOUNTER — Ambulatory Visit: Payer: BC Managed Care – PPO | Admitting: Occupational Therapy

## 2014-01-29 DIAGNOSIS — R269 Unspecified abnormalities of gait and mobility: Secondary | ICD-10-CM

## 2014-01-29 DIAGNOSIS — IMO0002 Reserved for concepts with insufficient information to code with codable children: Secondary | ICD-10-CM

## 2014-01-29 DIAGNOSIS — G8191 Hemiplegia, unspecified affecting right dominant side: Secondary | ICD-10-CM

## 2014-01-29 DIAGNOSIS — Z5189 Encounter for other specified aftercare: Secondary | ICD-10-CM | POA: Diagnosis not present

## 2014-01-29 DIAGNOSIS — G8194 Hemiplegia, unspecified affecting left nondominant side: Secondary | ICD-10-CM

## 2014-01-29 NOTE — Therapy (Signed)
[facility] [address] [place], [place], [address] Phone: [phone]   Fax:  [phone]  Occupational Therapy Treatment  Patient Details  Name: Jeremy [name] MRN: [id number] Date of Birth: September 02, 1965  Encounter Date: 01/29/2014      OT End of Session - 01/29/14 1534    Visit Number 4   Number of Visits 16   OT Start Time 1316   OT Stop Time 1359   OT Time Calculation (min) 43 min   Activity Tolerance Patient tolerated treatment well      Past Medical History  Diagnosis Date  . Hypertension 10/01/2011    basal ganglia  . Stroke   . High cholesterol     Past Surgical History  Procedure Laterality Date  . Tracheostomy tube placement  10/11/2011    Procedure: TRACHEOSTOMY;  Surgeon: Melida Quitter, MD;  Location: [place];  Service: ENT;  Laterality: N/A;    There were no vitals taken for this visit.  Visit Diagnosis:  Right hemiplegia  Lack of coordination due to stroke      Subjective Assessment - 01/29/14 1318    Symptoms (p) I can feel the muscles when I move my arm   Pertinent History (p) see epic snapshot   Currently in Pain? (p) No/denies            OT Treatments/Exercises (OP) - 01/29/14 0001    Exercises   Exercises Shoulder   Shoulder Exercises: Supine   Other Supine Exercises Chest presses 10 reps x2, shoulder flexion/extension 10 reps x2 with 3 pound weight.  pt to do as HEP. Wife present and also educated.   Shoulder Exercises: Seated   Flexion Weight (lbs) 10 reps x2 with 3 pound weight. Pt to do exercise as home program.  Wife present and also educated.           OT Education - 01/29/14 1356    Education provided Yes   Education Details HEP for UE strengthening using 3 pound weight.     Person(s) Educated Patient;Spouse   Methods Explanation;Demonstration;Handout   Comprehension Verbalized understanding;Returned demonstration          OT Short Term Goals - 01/29/14 1538    OT  SHORT TERM GOAL #1   Title Pt and wife will be I with HEP - 02/11/2014   Status On-going   OT SHORT TERM GOAL #2   Title Pt will demonstrate increased hand strength by 5 pounds in left hand to assst with functional activities.   Status On-going   OT SHORT TERM GOAL #3   Title Pt will demonstrate ability for 135 degrees of shoulder flexion for overhead reach in functional activities   Status On-going   OT SHORT TERM GOAL #4   Title Pt will demonstrate the ability to use LUE as gross assist with basic ADL tasks   Status On-going          OT Long Term Goals - 01/29/14 1538    OT LONG TERM GOAL #1   Title Pt and wife will be I with upgraded HEP  prn - 02/16/23/2016   Status On-going   OT LONG TERM GOAL #2   Title Pt will demonstrate increased hand strength by 7 pounds to assist with functional tasks   Status On-going   OT LONG TERM GOAL #3   Title Pt will demonstrate ability to pick up small objects with min a from table during functional tasks   Status On-going   OT  LONG TERM GOAL #4   Title Pt will demonstrate ability for shoulder flexion to 140 degress to assist with functional tasks   Status On-going   OT LONG TERM GOAL #5   Title Pt will be min a with bathing at a bed level   Status On-going          Plan - 01/29/14 1535    Clinical Impression Statement Pt given HEP to address proximal shoulder strength. Pt able to demonstrate all exercises with 3 pound weight and wife present and also educated.  Pt also given hand out   Rehab Potential Good   Clinical Impairments Affecting Rehab Potential Pt will benefit from skilled OT to address the following:  decrease AROM, decreased strength, decreased coordination, pain in shoulder, decreased activity tolerance, pt/family education.   OT Frequency 2x / week   OT Duration 8 weeks   OT Treatment/Interventions Self-care/ADL training;Moist Heat;Therapeutic exercise;Neuromuscular education;Manual Therapy;Functional Mobility Training;DME  and/or AE instruction;Passive range of motion;Therapeutic activities;Patient/family education;Balance training;Therapeutic exercises   Plan Neuro re ed to RUE to address active reach in sitting with functional tasks.                               Problem List Patient Active Problem List   Diagnosis Date Noted  . Genital warts 12/13/2013  . Hyperlipidemia 03/01/2012  . Routine health maintenance 02/03/2012  . Weakness of left side of body 12/14/2011  . Greater trochanteric bursitis 12/14/2011  . ICH (intracerebral hemorrhage) 10/01/2011  . Essential hypertension, benign 10/01/2011   Forde Radon, MS, OTR/L 01/29/2014 3:40 PM Phone: [phone] Fax: [phone]   [name] 01/29/2014, 3:40 PM

## 2014-01-29 NOTE — Patient Instructions (Signed)
Home Exercise Program - Do these exercise 2 times per day every day!!  Use a 3 pound weight to do them.  Stop if you get pain in your shoulder and let your therapist know.    1.  Lay on your back. Hold the weight with both hands.  Start with your hands at chest level.  Raise the weight toward the ceiling until elbows are straight. Hold for a count of 5. Lower to your chest.  Do 10 times, rest and do 10 more.    2. Lay on your back. Hold weight with both hands.  Start with your hands at your chest.  Raise weight toward the ceiling until elbows are straight.  KEEPING ELBOWS STRAIGHT,  STRAIGHT, raise your arms above your head and then reach toward your knees.  Repeat 10 times, rest, do 10 more.    3.  Sit in a chair.  Hold weight in both hands.  Straighten arms (elbows straight) and start with weight on your knees.  KEEPING ELBOWS STRAIGHT, raise weight to eye level, then lower to your knees. Do 10, rest, then do 10 more.   Do these exercises SLOWLY!!  The slower you go the stronger you get!  Let your therapist know if you have any questions or concerns!

## 2014-01-29 NOTE — Therapy (Signed)
[facility] [address] [place], [place], [address] Phone: [phone]   Fax:  [phone]  Physical Therapy Treatment  Patient Details  Name: Jeremy [name] MRN: [id number] Date of Birth: 12/19/1965  Encounter Date: 01/29/2014      PT End of Session - 01/29/14 2235    Visit Number 4   Number of Visits 17   Date for PT Re-Evaluation 02/13/14   Authorization Type BCBS no visit limit no auth required   PT Start Time 1406   PT Stop Time 1450   PT Time Calculation (min) 44 min   Equipment Utilized During Treatment Gait belt   Activity Tolerance Patient tolerated treatment well   Behavior During Therapy [facility] for tasks assessed/performed      Past Medical History  Diagnosis Date  . Hypertension 10/01/2011    basal ganglia  . Stroke   . High cholesterol     Past Surgical History  Procedure Laterality Date  . Tracheostomy tube placement  10/11/2011    Procedure: TRACHEOSTOMY;  Surgeon: Melida Quitter, MD;  Location: [place];  Service: ENT;  Laterality: N/A;    There were no vitals taken for this visit.  Visit Diagnosis:  Abnormality of gait  Hemiplegia affecting left nondominant side      Subjective Assessment - 01/29/14 2230    Symptoms pt. states he is walking with RW at home by himself - short distances   Pertinent History R CVA in August 2013; pt. received OP PT in May 2014 to address mobility and strength deficits   Currently in Pain? No/denies            [facility] Adult PT Treatment/Exercise - 01/29/14 1416    Ambulation/Gait   Ambulation/Gait Yes   Ambulation/Gait Assistance 4: Min guard   Ambulation Distance (Feet) 70 Feet  40' inside bars without KAFO   Assistive device Rolling walker  parallel bars also   Gait Pattern Step-through pattern;Decreased stride length;Decreased stance time - left;Trunk flexed   Knee/Hip Exercises: Aerobic   Stationary Bike Nustep level 3 x 4"    70' x 1 with KAFO:  Brace  removed to assess L knee stability and pt. Gait trained 10' x 4 reps inside parallel bars with rest after 2 reps due to dec. Endurance Sit to stand with CGA and cues given for positioning     PT Education - 01/29/14 2234    Education provided Yes   Education Details instructed pt to cont to wear KAFO at home when walking   Person(s) Educated Patient   Methods Explanation   Comprehension Verbalized understanding          PT Short Term Goals - 01/29/14 2238    PT SHORT TERM GOAL #1   Title Assess gait with L KAFO on LLE with RW or hemiwalker and establish goal as appropriate.   Baseline 02-13-14   PT SHORT TERM GOAL #2   Title Pt. will stand 5" with UE support as needed with CGA with assist. device   Baseline 02-13-14   PT SHORT TERM GOAL #3   Title Independent in HEP for LLE strengthening.   Baseline 02-13-14   PT SHORT TERM GOAL #4   Title Assess TUG with RW   Baseline 02-13-14            Plan - 01/29/14 2236    Clinical Impression Statement Pt. improving with amb. and with increasing distance (70') today prior to seated rest break; Left knee did not  buckle without KAFO in gait but L ankle supination noted in swing phase of gait   Pt will benefit from skilled therapeutic intervention in order to improve on the following deficits Abnormal gait;Decreased coordination;Decreased endurance;Decreased activity tolerance;Decreased balance;Decreased strength;Decreased mobility   PT Frequency 2x / week   PT Duration 8 weeks   PT Treatment/Interventions Therapeutic activities;Patient/family education;Therapeutic exercise;Gait training;Balance training;Stair training;Neuromuscular re-education;Functional mobility training   PT Next Visit Plan cont gait, balance and strengthening                               Problem List Patient Active Problem List   Diagnosis Date Noted  . Genital warts 12/13/2013  . Hyperlipidemia 03/01/2012  . Routine health  maintenance 02/03/2012  . Weakness of left side of body 12/14/2011  . Greater trochanteric bursitis 12/14/2011  . ICH (intracerebral hemorrhage) 10/01/2011  . Essential hypertension, benign 10/01/2011    Alda Lea 01/29/2014, 10:41 PM   Guido Sander, [place] [address], [place], [place] [address] [phone]

## 2014-01-30 ENCOUNTER — Ambulatory Visit: Payer: BC Managed Care – PPO | Admitting: Physical Therapy

## 2014-01-30 ENCOUNTER — Encounter: Payer: Self-pay | Admitting: Physical Therapy

## 2014-01-30 ENCOUNTER — Ambulatory Visit: Payer: BC Managed Care – PPO | Admitting: Occupational Therapy

## 2014-01-30 ENCOUNTER — Encounter: Payer: Self-pay | Admitting: Occupational Therapy

## 2014-01-30 DIAGNOSIS — IMO0002 Reserved for concepts with insufficient information to code with codable children: Secondary | ICD-10-CM

## 2014-01-30 DIAGNOSIS — G8194 Hemiplegia, unspecified affecting left nondominant side: Secondary | ICD-10-CM

## 2014-01-30 DIAGNOSIS — I6930 Unspecified sequelae of cerebral infarction: Secondary | ICD-10-CM

## 2014-01-30 DIAGNOSIS — Z5189 Encounter for other specified aftercare: Secondary | ICD-10-CM | POA: Diagnosis not present

## 2014-01-30 DIAGNOSIS — R269 Unspecified abnormalities of gait and mobility: Secondary | ICD-10-CM

## 2014-01-30 NOTE — Therapy (Signed)
[facility] [address] [place], [place], [address] Phone: [phone]   Fax:  [phone]  Physical Therapy Treatment  Patient Details  Name: Jeremy [name] MRN: [id number] Date of Birth: Nov 16, 1965  Encounter Date: 01/30/2014      PT End of Session - 01/30/14 1208    Visit Number 5   Number of Visits 17   Date for PT Re-Evaluation 02/13/14   Authorization Type BCBS no visit limit no auth required; Wife state insurance is changing after the first of year.   PT Start Time [phone]   PT Stop Time 0932   PT Time Calculation (min) 43 min      Past Medical History  Diagnosis Date  . Hypertension 10/01/2011    basal ganglia  . Stroke   . High cholesterol     Past Surgical History  Procedure Laterality Date  . Tracheostomy tube placement  10/11/2011    Procedure: TRACHEOSTOMY;  Surgeon: Melida Quitter, MD;  Location: [place];  Service: ENT;  Laterality: N/A;    There were no vitals taken for this visit.  Visit Diagnosis:  Lack of coordination due to stroke  Abnormality of gait  History of CVA with residual deficit  Hemiplegia affecting left nondominant side      Subjective Assessment - 01/30/14 0903    Symptoms Denies falls or changes.     Currently in Pain? No/denies          [facility] Adult PT Treatment/Exercise - 01/30/14 0903    Transfers   Transfers Sit to Stand;Stand to Sit;Stand Pivot Transfers   Sit to Stand 4: Min guard;With upper extremity assist;From chair/3-in-1   Stand to Sit 4: Min guard;With upper extremity assist;To chair/3-in-1   Stand to Sit Details cues to unlock KAFO on Left   Stand Pivot Transfers 4: Min guard;Other (comment)  with RW   Ambulation/Gait   Ambulation/Gait Yes   Ambulation/Gait Assistance 4: Min guard   Ambulation/Gait Assistance Details cues for posture and L foot placement   Ambulation Distance (Feet) 32 Feet  76' then 17' x 2 with KAFO on Left   Assistive device Rolling walker    Gait Pattern Step-through pattern;Decreased stride length;Decreased stance time - left;Trunk flexed   Knee/Hip Exercises: Aerobic   Stationary Bike Scifit level 1.4 x all 4 extremities x 8 minutes          PT Education - 01/29/14 2234    Education provided Yes   Education Details instructed pt to cont to wear KAFO at home when walking   Person(s) Educated Patient   Methods Explanation   Comprehension Verbalized understanding          PT Short Term Goals - 01/29/14 2238    PT SHORT TERM GOAL #1   Title Assess gait with L KAFO on LLE with RW or hemiwalker and establish goal as appropriate.   Baseline 02-13-14   PT SHORT TERM GOAL #2   Title Pt. will stand 5" with UE support as needed with CGA with assist. device   Baseline 02-13-14   PT SHORT TERM GOAL #3   Title Independent in HEP for LLE strengthening.   Baseline 02-13-14   PT SHORT TERM GOAL #4   Title Assess TUG with RW   Baseline 02-13-14           Plan - 01/30/14 1209    Clinical Impression Statement Pt needing to use bathroom during session for approx 5 minutes of session.  Needed brief  seated rest breaks during session.   Pt will benefit from skilled therapeutic intervention in order to improve on the following deficits Abnormal gait;Decreased coordination;Decreased endurance;Decreased activity tolerance;Decreased balance;Decreased strength;Decreased mobility   Rehab Potential Good   PT Frequency 2x / week   PT Duration 8 weeks   PT Treatment/Interventions Therapeutic activities;Patient/family education;Therapeutic exercise;Gait training;Balance training;Stair training;Neuromuscular re-education;Functional mobility training   PT Next Visit Plan Continue gait and strengthening   Consulted and Agree with Plan of Care Patient;Family member/caregiver   Family Member Consulted wife          Problem List Patient Active Problem List   Diagnosis Date Noted  . Genital warts 12/13/2013  . Hyperlipidemia  03/01/2012  . Routine health maintenance 02/03/2012  . Weakness of left side of body 12/14/2011  . Greater trochanteric bursitis 12/14/2011  . ICH (intracerebral hemorrhage) 10/01/2011  . Essential hypertension, benign 10/01/2011    Narda Bonds 01/30/2014, 12:13 PM     Narda Bonds, [place] 01/30/2014 12:13 PM Phone: [phone] Fax: [phone]

## 2014-01-30 NOTE — Therapy (Signed)
[facility] [address] [place], [place], [address] Phone: [phone]   Fax:  [phone]  Occupational Therapy Treatment  Patient Details  Name: Jeremy [name] MRN: [id number] Date of Birth: 10/03/1965  Encounter Date: 01/30/2014      OT End of Session - 01/30/14 1217    Visit Number 5   Number of Visits 16   OT Start Time 0930   OT Stop Time 1013   OT Time Calculation (min) 43 min   Activity Tolerance Patient tolerated treatment well      Past Medical History  Diagnosis Date  . Hypertension 10/01/2011    basal ganglia  . Stroke   . High cholesterol     Past Surgical History  Procedure Laterality Date  . Tracheostomy tube placement  10/11/2011    Procedure: TRACHEOSTOMY;  Surgeon: Jeremy Quitter, MD;  Location: [place];  Service: ENT;  Laterality: N/A;    There were no vitals taken for this visit.  Visit Diagnosis:  Lack of coordination due to stroke  Hemiplegia affecting left nondominant side      Subjective Assessment - 01/30/14 0933    Symptoms (p) "You worked me out good yesterday"   Pertinent History (p) see epic snapshot   Currently in Pain? (p) No/denies            OT Treatments/Exercises (OP) - 01/30/14 1016    Neurological Re-education Exercises   Other Exercises 1 Neuro re ed to address shoulder flexion with closed and open chained activities to address scapular stability with overhead reach, grasp and relesae with overhead reach, timing , coordination and reciprical alternating hand movements.  Resistance built in as well.           OT Education - 01/29/14 1356    Education provided Yes   Education Details HEP for UE strengthening using 3 pound weight.     Person(s) Educated Patient;Spouse   Methods Explanation;Demonstration;Handout   Comprehension Verbalized understanding;Returned demonstration          OT Short Term Goals - 01/30/14 1218    OT SHORT TERM GOAL #1   Title Pt and wife  will be I with HEP - 02/11/2014   Status On-going   OT SHORT TERM GOAL #2   Title Pt will demonstrate increased hand strength by 5 pounds in left hand to assst with functional activities.   Status On-going   OT SHORT TERM GOAL #3   Title Pt will demonstrate ability for 135 degrees of shoulder flexion for overhead reach in functional activities   Status On-going   OT SHORT TERM GOAL #4   Title Pt will demonstrate the ability to use LUE as gross assist with basic ADL tasks   Status On-going          OT Long Term Goals - 01/30/14 1218    OT LONG TERM GOAL #1   Title Pt and wife will be I with upgraded HEP  prn - 02/16/23/2016   Status On-going   OT LONG TERM GOAL #2   Title Pt will demonstrate increased hand strength by 7 pounds to assist with functional tasks   Status On-going   OT LONG TERM GOAL #3   Title Pt will demonstrate ability to pick up small objects with min a from table during functional tasks   Status On-going   OT LONG TERM GOAL #4   Title Pt will demonstrate ability for shoulder flexion to 140 degress to assist with functional tasks  Status On-going   OT LONG TERM GOAL #5   Title Pt will be min a with bathing at a bed level   Status On-going          Plan - 01/30/14 1217    Clinical Impression Statement Pt motivated to work in therapy today and progressing toward goals. Wife present for session.   Rehab Potential Good   Clinical Impairments Affecting Rehab Potential Pt will benefit from skilled OT to address the following:  decrease AROM, decreased strength, decreased coordination, pain in shoulder, decreased activity tolerance, pt/family education.   OT Frequency 2x / week   OT Duration 8 weeks   OT Treatment/Interventions Self-care/ADL training;Moist Heat;Therapeutic exercise;Neuromuscular education;Manual Therapy;Functional Mobility Training;DME and/or AE instruction;Passive range of motion;Therapeutic activities;Patient/family education;Balance  training;Therapeutic exercises   Plan neuro re ed to address active reach with functional tasks.                               Problem List Patient Active Problem List   Diagnosis Date Noted  . Genital warts 12/13/2013  . Hyperlipidemia 03/01/2012  . Routine health maintenance 02/03/2012  . Weakness of left side of body 12/14/2011  . Greater trochanteric bursitis 12/14/2011  . ICH (intracerebral hemorrhage) 10/01/2011  . Essential hypertension, benign 10/01/2011   Forde Radon, MS, OTR/L 01/30/2014 12:19 PM Phone: [phone] Fax: [phone]   [name] 01/30/2014, 12:19 PM

## 2014-02-04 ENCOUNTER — Encounter: Payer: Self-pay | Admitting: Occupational Therapy

## 2014-02-04 ENCOUNTER — Encounter: Payer: Self-pay | Admitting: Physical Therapy

## 2014-02-04 ENCOUNTER — Ambulatory Visit: Payer: BC Managed Care – PPO | Admitting: Physical Therapy

## 2014-02-04 ENCOUNTER — Ambulatory Visit: Payer: BC Managed Care – PPO | Admitting: Occupational Therapy

## 2014-02-04 DIAGNOSIS — IMO0002 Reserved for concepts with insufficient information to code with codable children: Secondary | ICD-10-CM

## 2014-02-04 DIAGNOSIS — R269 Unspecified abnormalities of gait and mobility: Secondary | ICD-10-CM

## 2014-02-04 DIAGNOSIS — G8194 Hemiplegia, unspecified affecting left nondominant side: Secondary | ICD-10-CM

## 2014-02-04 DIAGNOSIS — Z5189 Encounter for other specified aftercare: Secondary | ICD-10-CM | POA: Diagnosis not present

## 2014-02-04 NOTE — Therapy (Signed)
[facility] [address] [place], [place], [address] Phone: [phone]   Fax:  [phone]  Occupational Therapy Treatment  Patient Details  Name: Jeremy [name] MRN: [id number] Date of Birth: 11/13/1965  Encounter Date: 02/04/2014      OT End of Session - 02/04/14 1613    Visit Number 6   Number of Visits 16   OT Start Time 6222   OT Stop Time 9798   OT Time Calculation (min) 41 min   Activity Tolerance Patient tolerated treatment well      Past Medical History  Diagnosis Date  . Hypertension 10/01/2011    basal ganglia  . Stroke   . High cholesterol     Past Surgical History  Procedure Laterality Date  . Tracheostomy tube placement  10/11/2011    Procedure: TRACHEOSTOMY;  Surgeon: Melida Quitter, MD;  Location: [facility];  Service: ENT;  Laterality: N/A;    There were no vitals taken for this visit.  Visit Diagnosis:  Lack of coordination due to stroke  Hemiplegia affecting left nondominant side      Subjective Assessment - 02/04/14 1535    Symptoms I am doing my exercises at home except for this weekend.   Pertinent History see epic snapshot   Currently in Pain? No/denies                 OT Treatments/Exercises (OP) - 02/04/14 0001    Exercises   Exercises Hand   Hand Exercises   Theraputty Flatten;Roll;Grip;Pinch;Locate Pegs  with red putty;  20 pegs   Other Hand Exercises Pt's L hand strength has not improved since eval.  Pt admitted he has not been doing his theraputty exercises at home.  Completed entire program with pt including number of repetitions that HEP requires to ensure that pt understood program.  Stressed importance of compliance of completing HEP if  pt wishes to meet goals.  Pt verbalized understanding and in agreement.  Pt able to follow HEP with min vc's.                OT Education - 02/04/14 1610    Education provided Yes   Education Details Reviewed  theraputty HEP in order to increase compliance   Person(s) Educated Patient   Methods Explanation;Demonstration   Comprehension Verbalized understanding;Returned demonstration          OT Short Term Goals - 02/04/14 1616    OT SHORT TERM GOAL #1   Title Pt and wife will be I with HEP - 02/11/2014   Status On-going   OT SHORT TERM GOAL #2   Title Pt will demonstrate increased hand strength by 5 pounds in left hand to assst with functional activities.   Status On-going   OT SHORT TERM GOAL #3   Title Pt will demonstrate ability for 135 degrees of shoulder flexion for overhead reach in functional activities   Status On-going   OT SHORT TERM GOAL #4   Title Pt will demonstrate the ability to use LUE as gross assist with basic ADL tasks   Status Achieved           OT Long Term Goals - 02/04/14 1617    OT LONG TERM GOAL #1   Title Pt and wife will be I with upgraded HEP  prn - 02/16/23/2016   Status On-going   OT LONG TERM GOAL #2   Title Pt will demonstrate increased hand strength by 7 pounds to assist with  functional tasks   Status On-going   OT LONG TERM GOAL #3   Title Pt will demonstrate ability to pick up small objects with min a from table during functional tasks   Status On-going   OT LONG TERM GOAL #4   Title Pt will demonstrate ability for shoulder flexion to 140 degress to assist with functional tasks   Status On-going   OT LONG TERM GOAL #5   Title Pt will be min a with bathing at a bed level   Status On-going               Plan - 02/04/14 1614    Clinical Impression Statement Pt non compliant with theraputty HEP; reviewed and stressed importance of program.  Pt is using RUE as gross assist in functional tasks and in some tasks as non dominant.   Rehab Potential Good   Clinical Impairments Affecting Rehab Potential Pt will benefit from skilled OT to address the following:  decrease AROM, decreased strength, decreased coordination, pain in shoulder,  decreased activity tolerance, pt/family education.   OT Frequency 2x / week   OT Duration 8 weeks   OT Treatment/Interventions Self-care/ADL training;Moist Heat;Therapeutic exercise;Neuromuscular education;Manual Therapy;Functional Mobility Training;DME and/or AE instruction;Passive range of motion;Therapeutic activities;Patient/family education;Balance training;Therapeutic exercises   Plan check HEP;  neuro re ed to address overhead reach and coordination        Problem List Patient Active Problem List   Diagnosis Date Noted  . Genital warts 12/13/2013  . Hyperlipidemia 03/01/2012  . Routine health maintenance 02/03/2012  . Weakness of left side of body 12/14/2011  . Greater trochanteric bursitis 12/14/2011  . ICH (intracerebral hemorrhage) 10/01/2011  . Essential hypertension, benign 10/01/2011    [name] 02/04/2014, 4:18 PM  [facility] [address] [place], [place], [address] Phone: [phone]   Fax:  [phone]

## 2014-02-04 NOTE — Therapy (Signed)
[facility] [address] [place], [place], [address] Phone: [phone]   Fax:  [phone]  Physical Therapy Treatment  Patient Details  Name: Jeremy [name] MRN: [id number] Date of Birth: July 22, 1965  Encounter Date: 02/04/2014      PT End of Session - 02/04/14 2040    Visit Number 6   Number of Visits 17   Date for PT Re-Evaluation 02/13/14   Authorization Type BCBS no visit limit no auth required; Wife state insurance is changing after the first of year.   PT Start Time 1445   PT Stop Time 1532   PT Time Calculation (min) 47 min   Equipment Utilized During Treatment Gait belt   Activity Tolerance Patient tolerated treatment well      Past Medical History  Diagnosis Date  . Hypertension 10/01/2011    basal ganglia  . Stroke   . High cholesterol     Past Surgical History  Procedure Laterality Date  . Tracheostomy tube placement  10/11/2011    Procedure: TRACHEOSTOMY;  Surgeon: Melida Quitter, MD;  Location: [facility];  Service: ENT;  Laterality: N/A;    There were no vitals taken for this visit.  Visit Diagnosis:  Hemiplegia affecting left nondominant side  Abnormality of gait      Subjective Assessment - 02/04/14 1448    Symptoms Pt. reports walking at home with walker   Pertinent History R CVA in August 2013; pt. received OP PT in May 2014 to address mobility and strength deficits   Currently in Pain? No/denies   Multiple Pain Sites No                    [facility] Adult PT Treatment/Exercise - 02/04/14 1509    Transfers   Transfers Sit to Stand   Sit to Stand With upper extremity assist  CGA   Stand to Sit 4: Min guard;With upper extremity assist   Ambulation/Gait   Ambulation/Gait Yes   Ambulation/Gait Assistance --  CGA   Ambulation Distance (Feet) 45 Feet  69' 2nd rep with RW   Assistive device Rolling walker   Gait Pattern Step-through pattern;Decreased stride length;Decreased  stance time - left;Trunk flexed   High Level Balance   High Level Balance Comments pt. performed tap ups to 2" step with RLE x 10 reps for L single limb stance      Pt. Ambulated inside parallel bars 10' x4 reps with RUE support; pt. Performed static standing without UE support inside bars with CGA             PT Short Term Goals - 01/29/14 2238    PT SHORT TERM GOAL #1   Title Assess gait with L KAFO on LLE with RW or hemiwalker and establish goal as appropriate.   Baseline 02-13-14   PT SHORT TERM GOAL #2   Title Pt. will stand 5" with UE support as needed with CGA with assist. device   Baseline 02-13-14   PT SHORT TERM GOAL #3   Title Independent in HEP for LLE strengthening.   Baseline 02-13-14   PT SHORT TERM GOAL #4   Title Assess TUG with RW   Baseline 02-13-14           PT Long Term Goals - 01/21/14 1519    PT LONG TERM GOAL #1   Title Amb. 20' with KAFO on LLE with min. assist with RW for incr. household accessibility.   Baseline 03-15-14  Time 8   Period Weeks   Status On-going   PT LONG TERM GOAL #2   Title Improve TUG by 4 secs to demo incr. functional mobility (with use of RW)   Baseline 03-15-14   Time 8   Period Weeks   Status On-going   PT LONG TERM GOAL #3   Title Don and doff KAFO with min. assist.   Baseline 03-15-14   Time 8   Status On-going   PT LONG TERM GOAL #4   Title Independent in updated HEP for LLE strengthening exercises.   Baseline 03-15-14   Time 8   Period Weeks   Status On-going               Plan - 02/04/14 2041    Clinical Impression Statement Pt. demonstrating incr. endurance/activity tolerance as pt. amb. 75' nonstop today - furthest distance without requiring seated rest break   Pt will benefit from skilled therapeutic intervention in order to improve on the following deficits Abnormal gait;Decreased coordination;Decreased endurance;Decreased activity tolerance;Decreased balance;Decreased strength;Decreased  mobility   Rehab Potential Good   PT Frequency 2x / week   PT Duration 8 weeks   PT Treatment/Interventions Therapeutic activities;Patient/family education;Therapeutic exercise;Gait training;Balance training;Stair training;Neuromuscular re-education;Functional mobility training   PT Next Visit Plan Continue gait and strengthening   Consulted and Agree with Plan of Care Patient        Problem List Patient Active Problem List   Diagnosis Date Noted  . Genital warts 12/13/2013  . Hyperlipidemia 03/01/2012  . Routine health maintenance 02/03/2012  . Weakness of left side of body 12/14/2011  . Greater trochanteric bursitis 12/14/2011  . ICH (intracerebral hemorrhage) 10/01/2011  . Essential hypertension, benign 10/01/2011    Alda Lea, PT 02/04/2014, 8:43 PM  [facility] [address] [place], [place], [address] Phone: [phone]   Fax:  [phone]

## 2014-02-04 NOTE — Patient Instructions (Signed)
Reviewed theraputty HEP and had pt return demonstrate. Stressed importance of compliance with program to meet strengthening goal.  Pt verbalized understanding and able to return demonstrate with min vc's.

## 2014-02-05 ENCOUNTER — Encounter: Payer: Self-pay | Admitting: Occupational Therapy

## 2014-02-05 ENCOUNTER — Ambulatory Visit: Payer: BC Managed Care – PPO | Admitting: Physical Therapy

## 2014-02-05 ENCOUNTER — Encounter: Payer: Self-pay | Admitting: Physical Therapy

## 2014-02-05 ENCOUNTER — Ambulatory Visit: Payer: BC Managed Care – PPO | Admitting: Occupational Therapy

## 2014-02-05 DIAGNOSIS — G8194 Hemiplegia, unspecified affecting left nondominant side: Secondary | ICD-10-CM

## 2014-02-05 DIAGNOSIS — IMO0002 Reserved for concepts with insufficient information to code with codable children: Secondary | ICD-10-CM

## 2014-02-05 DIAGNOSIS — R269 Unspecified abnormalities of gait and mobility: Secondary | ICD-10-CM

## 2014-02-05 DIAGNOSIS — Z5189 Encounter for other specified aftercare: Secondary | ICD-10-CM | POA: Diagnosis not present

## 2014-02-05 NOTE — Therapy (Signed)
[facility] [address] [place], [place], [address] Phone: [phone]   Fax:  [phone]  Occupational Therapy Treatment  Patient Details  Name: Jeremy [name] MRN: [id number] Date of Birth: 1965/09/21  Encounter Date: 02/05/2014      OT End of Session - 02/05/14 1239    Visit Number 7   Number of Visits 16   OT Start Time 1103   OT Stop Time 1146   OT Time Calculation (min) 43 min   Activity Tolerance Patient tolerated treatment well      Past Medical History  Diagnosis Date  . Hypertension 10/01/2011    basal ganglia  . Stroke   . High cholesterol     Past Surgical History  Procedure Laterality Date  . Tracheostomy tube placement  10/11/2011    Procedure: TRACHEOSTOMY;  Surgeon: Melida Quitter, MD;  Location: [facility];  Service: ENT;  Laterality: N/A;    There were no vitals taken for this visit.  Visit Diagnosis:  Lack of coordination due to stroke  Hemiplegia affecting left nondominant side      Subjective Assessment - 02/05/14 1108    Symptoms (p) "I can feel my muscles in my arm"   Pertinent History (p) see epic snapshot   Currently in Pain? (p) No/denies                 OT Treatments/Exercises (OP) - 02/05/14 1235    Neurological Re-education Exercises   Other Exercises 1 Neuro re ed to address shoulder flexion above 90 degrees in supine and sitting with emphasis on biomechanical alignement, increasing flexibility, decreasing tightness/tone, increasing external rotation. Incorporated grasp./release into reaching as well as focus on decreasing ataxia.  Pt able to tolerate 3 pound weight as resistance.                OT Education - 02/04/14 1610    Education provided Yes   Education Details Reviewed theraputty HEP in order to increase compliance   Person(s) Educated Patient   Methods Explanation;Demonstration   Comprehension Verbalized understanding;Returned demonstration          OT Short Term Goals - 02/05/14 1240    OT SHORT TERM GOAL #1   Title Pt and wife will be I with HEP - 02/11/2014   Status On-going   OT SHORT TERM GOAL #2   Title Pt will demonstrate increased hand strength by 5 pounds in left hand to assst with functional activities.   Status On-going   OT SHORT TERM GOAL #3   Title Pt will demonstrate ability for 135 degrees of shoulder flexion for overhead reach in functional activities   Status On-going   OT SHORT TERM GOAL #4   Title Pt will demonstrate the ability to use LUE as gross assist with basic ADL tasks   Status Achieved           OT Long Term Goals - 02/05/14 1241    OT LONG TERM GOAL #1   Title Pt and wife will be I with upgraded HEP  prn - 02/16/23/2016   Status On-going   OT LONG TERM GOAL #2   Title Pt will demonstrate increased hand strength by 7 pounds to assist with functional tasks   Status On-going   OT LONG TERM GOAL #3   Title Pt will demonstrate ability to pick up small objects with min a from table during functional tasks   Status On-going   OT LONG TERM GOAL #4  Title Pt will demonstrate ability for shoulder flexion to 140 degress to assist with functional tasks   Status On-going   OT LONG TERM GOAL #5   Title Pt will be min a with bathing at a bed level   Status On-going               Plan - 02/05/14 1239    Clinical Impression Statement Pt actively working in therapy to address ongoing goals.  Again stressed importance of completing HEP consistently in order to meet goals.   Rehab Potential Good   Clinical Impairments Affecting Rehab Potential Pt will benefit from skilled OT to address the following:  decrease AROM, decreased strength, decreased coordination, pain in shoulder, decreased activity tolerance, pt/family education.   OT Frequency 2x / week   OT Duration 8 weeks   OT Treatment/Interventions Self-care/ADL training;Moist Heat;Therapeutic exercise;Neuromuscular education;Manual  Therapy;Functional Mobility Training;DME and/or AE instruction;Passive range of motion;Therapeutic activities;Patient/family education;Balance training;Therapeutic exercises   Plan continue to encourage HEP, neuro re ed to address overhead reach and coordination        Problem List Patient Active Problem List   Diagnosis Date Noted  . Genital warts 12/13/2013  . Hyperlipidemia 03/01/2012  . Routine health maintenance 02/03/2012  . Weakness of left side of body 12/14/2011  . Greater trochanteric bursitis 12/14/2011  . ICH (intracerebral hemorrhage) 10/01/2011  . Essential hypertension, benign 10/01/2011    [name], OTR/L 02/05/2014, 12:41 PM  [facility] [address] [place], [place], [address] Phone: [phone]   Fax:  [phone]

## 2014-02-05 NOTE — Therapy (Signed)
[facility] [address] [place], [place], [address] Phone: [phone]   Fax:  [phone]  Physical Therapy Treatment  Patient Details  Name: Jeremy [name] MRN: [id number] Date of Birth: 1965-09-15  Encounter Date: 02/05/2014      PT End of Session - 02/05/14 1244    Visit Number 7   Number of Visits 17   Date for PT Re-Evaluation 02/13/14   Authorization Type BCBS no visit limit no auth required   PT Start Time 1147   PT Stop Time 1230   PT Time Calculation (min) 43 min   Equipment Utilized During Treatment Gait belt   Activity Tolerance Patient tolerated treatment well   Behavior During Therapy [facility] for tasks assessed/performed      Past Medical History  Diagnosis Date  . Hypertension 10/01/2011    basal ganglia  . Stroke   . High cholesterol     Past Surgical History  Procedure Laterality Date  . Tracheostomy tube placement  10/11/2011    Procedure: TRACHEOSTOMY;  Surgeon: Melida Quitter, MD;  Location: [facility];  Service: ENT;  Laterality: N/A;    There were no vitals taken for this visit.  Visit Diagnosis:  Lack of coordination due to stroke  Hemiplegia affecting left nondominant side  Abnormality of gait      Subjective Assessment - 02/05/14 1216    Symptoms No changes since last visit.  Only walking once a day at home.   Currently in Pain? No/denies           [facility] Adult PT Treatment/Exercise - 02/05/14 1218    Transfers   Transfers Sit to Stand;Stand to Sit   Sit to Stand With upper extremity assist;4: Min guard;From chair/3-in-1   Stand to Sit 4: Min assist;With upper extremity assist;To chair/3-in-1   Stand to Sit Details cues to unlock L KAFO at times and to control descent.[place] guard if going to scooter but min assist to regular chair.   Ambulation/Gait   Ambulation/Gait Yes   Ambulation/Gait Assistance 4: Min guard   Ambulation/Gait Assistance Details cues on L heel strike and  upright posture   Ambulation Distance (Feet) 50 Feet  40' 2nd rep, 20' 3rd rep, 20' 4th and 5th rep   Assistive device Rolling walker   Gait Pattern Step-through pattern;Decreased stride length;Decreased stance time - left;Trunk flexed;Decreased dorsiflexion - left   Knee/Hip Exercises: Aerobic   Stationary Bike Scifit level 1.5 all 4 extremities x 10 minutes           PT Education - 02/05/14 1244    Education provided Yes   Education Details Increasing ambulation to twice a day at home   Person(s) Educated Patient   Methods Explanation   Comprehension Verbalized understanding          PT Short Term Goals - 01/29/14 2238    PT SHORT TERM GOAL #1   Title Assess gait with L KAFO on LLE with RW or hemiwalker and establish goal as appropriate.   Baseline 02-13-14   PT SHORT TERM GOAL #2   Title Pt. will stand 5" with UE support as needed with CGA with assist. device   Baseline 02-13-14   PT SHORT TERM GOAL #3   Title Independent in HEP for LLE strengthening.   Baseline 02-13-14   PT SHORT TERM GOAL #4   Title Assess TUG with RW   Baseline 02-13-14           PT Long Term  Goals - 01/21/14 1519    PT LONG TERM GOAL #1   Title Amb. 88' with KAFO on LLE with min. assist with RW for incr. household accessibility.   Baseline 03-15-14   Time 8   Period Weeks   Status On-going   PT LONG TERM GOAL #2   Title Improve TUG by 4 secs to demo incr. functional mobility (with use of RW)   Baseline 03-15-14   Time 8   Period Weeks   Status On-going   PT LONG TERM GOAL #3   Title Don and doff KAFO with min. assist.   Baseline 03-15-14   Time 8   Status On-going   PT LONG TERM GOAL #4   Title Independent in updated HEP for LLE strengthening exercises.   Baseline 03-15-14   Time 8   Period Weeks   Status On-going           Plan - 02/05/14 1245    Clinical Impression Statement Pt able to gradually increase distance/reps of ambulation each treatment.   Pt will benefit from  skilled therapeutic intervention in order to improve on the following deficits Abnormal gait;Decreased coordination;Decreased endurance;Decreased activity tolerance;Decreased balance;Decreased strength;Decreased mobility   Rehab Potential Good   PT Frequency 2x / week   PT Duration 8 weeks   PT Treatment/Interventions Therapeutic activities;Patient/family education;Therapeutic exercise;Gait training;Balance training;Stair training;Neuromuscular re-education;Functional mobility training   PT Next Visit Plan Begin checking STG's   Consulted and Agree with Plan of Care Patient        Problem List Patient Active Problem List   Diagnosis Date Noted  . Genital warts 12/13/2013  . Hyperlipidemia 03/01/2012  . Routine health maintenance 02/03/2012  . Weakness of left side of body 12/14/2011  . Greater trochanteric bursitis 12/14/2011  . ICH (intracerebral hemorrhage) 10/01/2011  . Essential hypertension, benign 10/01/2011    Narda Bonds 02/05/2014, 12:49 PM  [facility] [address] [place], [place], [address] Phone: [phone]   Fax:  [facility], [place] 02/05/2014 12:49 PM Phone: [phone] Fax: [phone]

## 2014-02-12 ENCOUNTER — Ambulatory Visit: Payer: BC Managed Care – PPO | Admitting: Physical Therapy

## 2014-02-12 ENCOUNTER — Ambulatory Visit: Payer: BC Managed Care – PPO | Admitting: Occupational Therapy

## 2014-02-12 ENCOUNTER — Encounter: Payer: Self-pay | Admitting: Physical Therapy

## 2014-02-12 DIAGNOSIS — G8194 Hemiplegia, unspecified affecting left nondominant side: Secondary | ICD-10-CM

## 2014-02-12 DIAGNOSIS — IMO0002 Reserved for concepts with insufficient information to code with codable children: Secondary | ICD-10-CM

## 2014-02-12 DIAGNOSIS — Z5189 Encounter for other specified aftercare: Secondary | ICD-10-CM | POA: Diagnosis not present

## 2014-02-12 DIAGNOSIS — R269 Unspecified abnormalities of gait and mobility: Secondary | ICD-10-CM

## 2014-02-12 NOTE — Therapy (Signed)
[facility] [address] [place], [place], [address] Phone: [phone]   Fax:  [phone]  Physical Therapy Treatment  Patient Details  Name: Jeremy [name] MRN: [id number] Date of Birth: 11-27-1965  Encounter Date: 02/12/2014      PT End of Session - 02/12/14 1657    Visit Number 8   Number of Visits 17   Date for PT Re-Evaluation 02/13/14   Authorization Type BCBS no visit limit no auth required   PT Start Time 1104   PT Stop Time 1146   PT Time Calculation (min) 42 min   Equipment Utilized During Treatment Gait belt      Past Medical History  Diagnosis Date  . Hypertension 10/01/2011    basal ganglia  . Stroke   . High cholesterol     Past Surgical History  Procedure Laterality Date  . Tracheostomy tube placement  10/11/2011    Procedure: TRACHEOSTOMY;  Surgeon: Melida Quitter, MD;  Location: [facility];  Service: ENT;  Laterality: N/A;    There were no vitals taken for this visit.  Visit Diagnosis:  Hemiplegia affecting left nondominant side  Abnormality of gait      Subjective Assessment - 02/12/14 1647    Symptoms Pt. reports only walking 1x at home - states wife does not want him walking twice   Pertinent History R CVA in August 2013; pt. received OP PT in May 2014 to address mobility and strength deficits   Currently in Pain? --                    [facility] Adult PT Treatment/Exercise - 02/12/14 1648    Transfers   Transfers Sit to Stand;Stand to Sit   Sit to Stand With upper extremity assist;4: Min guard;From chair/3-in-1   Stand to Sit 5: Supervision   Ambulation/Gait   Ambulation/Gait Yes   Ambulation/Gait Assistance Other (comment)  CGA   Ambulation/Gait Assistance Details cues on posture and step length   Ambulation Distance (Feet) 70 Feet  2 reps   Assistive device Rolling walker   Gait Pattern Step-through pattern;Decreased stride length;Decreased stance time - left;Trunk  flexed;Decreased dorsiflexion - left   High Level Balance   High Level Balance Activities Side stepping  inside bars with UE support   High Level Balance Comments backwards ambulation inside bars with UE support     Sidestepping inside parallel bars with UE support with CGA 10' x 4 reps             PT Short Term Goals - 02/12/14 1701    PT SHORT TERM GOAL #1   Title Assess gait with L KAFO on LLE with RW or hemiwalker and establish goal as appropriate.   Baseline pt. amb. 87' with RW before requiring seated rest break   Time 1   Period Days   Status Achieved   PT SHORT TERM GOAL #2   Title Pt. will stand 5" with UE support as needed with CGA with assist. device   Baseline 02-13-14   Time 1   Period Days   Status Achieved   PT SHORT TERM GOAL #3   Title Independent in HEP for LLE strengthening.   Baseline 02-13-14   Time 1   Period Days   Status Achieved   PT SHORT TERM GOAL #4   Title Assess TUG with RW   Baseline 1" 37.81 secs with RW tested   Time 1   Status Achieved  PT Long Term Goals - 01/21/14 1519    PT LONG TERM GOAL #1   Title Amb. 34' with KAFO on LLE with min. assist with RW for incr. household accessibility.   Baseline 03-15-14   Time 8   Period Weeks   Status On-going   PT LONG TERM GOAL #2   Title Improve TUG by 4 secs to demo incr. functional mobility (with use of RW)   Baseline 03-15-14   Time 8   Period Weeks   Status On-going   PT LONG TERM GOAL #3   Title Don and doff KAFO with min. assist.   Baseline 03-15-14   Time 8   Status On-going   PT LONG TERM GOAL #4   Title Independent in updated HEP for LLE strengthening exercises.   Baseline 03-15-14   Time 8   Period Weeks   Status On-going               Plan - 02/12/14 1658    Clinical Impression Statement Pt. demonstrating improved endurance and incr. amb. distance; cont. to be hesitant to increase ambulation at home; pt. has met 4/4 STG's   Pt will benefit from  skilled therapeutic intervention in order to improve on the following deficits Abnormal gait;Decreased coordination;Decreased endurance;Decreased activity tolerance;Decreased balance;Decreased strength;Decreased mobility   Rehab Potential Good   PT Frequency 2x / week   PT Duration 8 weeks   PT Treatment/Interventions Therapeutic activities;Patient/family education;Therapeutic exercise;Gait training;Balance training;Stair training;Neuromuscular re-education;Functional mobility training   PT Next Visit Plan continue gait and balance training; begin working toward LTG's        Problem List Patient Active Problem List   Diagnosis Date Noted  . Genital warts 12/13/2013  . Hyperlipidemia 03/01/2012  . Routine health maintenance 02/03/2012  . Weakness of left side of body 12/14/2011  . Greater trochanteric bursitis 12/14/2011  . ICH (intracerebral hemorrhage) 10/01/2011  . Essential hypertension, benign 10/01/2011    Alda Lea, PT 02/12/2014, 5:18 PM  [facility] [address] [place], [place], [address] Phone: [phone]   Fax:  [phone]

## 2014-02-13 ENCOUNTER — Ambulatory Visit: Payer: BC Managed Care – PPO | Admitting: Physical Therapy

## 2014-02-13 ENCOUNTER — Encounter: Payer: Self-pay | Admitting: Physical Therapy

## 2014-02-13 ENCOUNTER — Ambulatory Visit: Payer: BC Managed Care – PPO | Admitting: Occupational Therapy

## 2014-02-13 DIAGNOSIS — R269 Unspecified abnormalities of gait and mobility: Secondary | ICD-10-CM

## 2014-02-13 DIAGNOSIS — G8194 Hemiplegia, unspecified affecting left nondominant side: Secondary | ICD-10-CM

## 2014-02-13 DIAGNOSIS — IMO0002 Reserved for concepts with insufficient information to code with codable children: Secondary | ICD-10-CM

## 2014-02-13 DIAGNOSIS — Z5189 Encounter for other specified aftercare: Secondary | ICD-10-CM | POA: Diagnosis not present

## 2014-02-13 DIAGNOSIS — I6930 Unspecified sequelae of cerebral infarction: Secondary | ICD-10-CM

## 2014-02-13 DIAGNOSIS — G8191 Hemiplegia, unspecified affecting right dominant side: Secondary | ICD-10-CM

## 2014-02-13 NOTE — Therapy (Signed)
[facility] [address] [place], [place], [address] Phone: [phone]   Fax:  [phone]  Occupational Therapy Treatment  Patient Details  Name: Jeremy [name] MRN: [id number] Date of Birth: 1965/11/23  Encounter Date: 02/13/2014      OT End of Session - 02/13/14 1324    Visit Number 9   Number of Visits 16   Authorization Type BCBS   OT Start Time 1318   Activity Tolerance Patient tolerated treatment well   Behavior During Therapy [facility] for tasks assessed/performed      Past Medical History  Diagnosis Date  . Hypertension 10/01/2011    basal ganglia  . Stroke   . High cholesterol     Past Surgical History  Procedure Laterality Date  . Tracheostomy tube placement  10/11/2011    Procedure: TRACHEOSTOMY;  Surgeon: Christia Reading, MD;  Location: [facility] OR;  Service: ENT;  Laterality: N/A;    There were no vitals taken for this visit.  Visit Diagnosis:  Lack of coordination due to stroke  Hemiplegia affecting left nondominant side      Subjective Assessment - 02/13/14 1324    Currently in Pain? No/denies        Treatment: UBE x 5 mins level 4, pt able to maintain 20-30 RPM for reciprocal movement/ conditioning. Reviewed CZY:SAYT 3lbs weight in supine for shoulder flexion and chest press 10 reps Seated shoulder flexion with 3 lbs x 10 reps, min v.c.  then pt returned demonstration. Functional reaching to grasp /release 1 in blocks and place in container with lateral weigh shifts with LUE min v.c. for positioning/ technique min-mod difficulty                   OT Short Term Goals - 02/05/14 1240    OT SHORT TERM GOAL #1   Title Pt and wife will be I with HEP - 02/11/2014   Status On-going   OT SHORT TERM GOAL #2   Title Pt will demonstrate increased hand strength by 5 pounds in left hand to assst with functional activities.   Status On-going   OT SHORT TERM GOAL #3   Title Pt will  demonstrate ability for 135 degrees of shoulder flexion for overhead reach in functional activities   Status On-going   OT SHORT TERM GOAL #4   Title Pt will demonstrate the ability to use LUE as gross assist with basic ADL tasks   Status Achieved           OT Long Term Goals - 02/05/14 1241    OT LONG TERM GOAL #1   Title Pt and wife will be I with upgraded HEP  prn - 02/16/23/2016   Status On-going   OT LONG TERM GOAL #2   Title Pt will demonstrate increased hand strength by 7 pounds to assist with functional tasks   Status On-going   OT LONG TERM GOAL #3   Title Pt will demonstrate ability to pick up small objects with min a from table during functional tasks   Status On-going   OT LONG TERM GOAL #4   Title Pt will demonstrate ability for shoulder flexion to 140 degress to assist with functional tasks   Status On-going   OT LONG TERM GOAL #5   Title Pt will be min a with bathing at a bed level   Status On-going               Plan - 02/13/14 1250  Clinical Impression Statement Pt is progressing towards goals.   Rehab Potential Good   Clinical Impairments Affecting Rehab Potential Pt will benefit from skilled OT to address the following:  decrease AROM, decreased strength, decreased coordination, pain in shoulder, decreased activity tolerance, pt/family education.   Plan neuro re-ed, functional reaching   Consulted and Agree with Plan of Care Patient        Problem List Patient Active Problem List   Diagnosis Date Noted  . Genital warts 12/13/2013  . Hyperlipidemia 03/01/2012  . Routine health maintenance 02/03/2012  . Weakness of left side of body 12/14/2011  . Greater trochanteric bursitis 12/14/2011  . ICH (intracerebral hemorrhage) 10/01/2011  . Essential hypertension, benign 10/01/2011   Keene Breath, OTR/L [phone] Phone: [phone] 1:47 PM 02/13/2014 Jeremy [name] 02/13/2014, 1:25 PM  [facility] [address] [place], [place], [address] Phone: [phone]   Fax:  [phone]

## 2014-02-13 NOTE — Therapy (Signed)
[facility] [address] [place], [place], [address] Phone: [phone]   Fax:  [phone]  Physical Therapy Treatment  Patient Details  Name: Jeremy [name] MRN: [id number] Date of Birth: 20-Sep-1965  Encounter Date: 02/13/2014      PT End of Session - 02/13/14 1548    Visit Number 9   Number of Visits 17   Date for PT Re-Evaluation 03/15/14   Authorization Type BCBS no visit limit no auth required   PT Start Time 1405   PT Stop Time 1448   PT Time Calculation (min) 43 min   Equipment Utilized During Treatment Gait belt   Activity Tolerance Patient tolerated treatment well   Behavior During Therapy [facility] for tasks assessed/performed      Past Medical History  Diagnosis Date  . Hypertension 10/01/2011    basal ganglia  . Stroke   . High cholesterol     Past Surgical History  Procedure Laterality Date  . Tracheostomy tube placement  10/11/2011    Procedure: TRACHEOSTOMY;  Surgeon: Melida Quitter, MD;  Location: [place];  Service: ENT;  Laterality: N/A;    There were no vitals taken for this visit.  Visit Diagnosis:  Abnormality of gait  History of CVA with residual deficit  Right hemiplegia  Lack of coordination due to stroke  Hemiplegia affecting left nondominant side      Subjective Assessment - 02/13/14 1526    Symptoms No c/o.  Denies falls.   Currently in Pain? No/denies           [facility] Adult PT Treatment/Exercise - 02/13/14 1542    Bed Mobility   Bed Mobility Supine to Sit;Sit to Supine   Supine to Sit 4: Min assist;HOB flat;Other (comment)  from half L sidelying   Sit to Supine 4: Min assist;HOB flat   Sit to Supine - Details (indicate cue type and reason) needed assist for LLE   Transfers   Transfers Sit to Stand;Stand to Sit   Sit to Stand 5: Supervision;With upper extremity assist;From chair/3-in-1   Sit to Stand Details (indicate cue type and reason) confirmation on foot placement  and cues to lock out KAFO upon standing   Stand to Sit 5: Supervision   Stand to Sit Details cues to unlock KAFO   Stand Pivot Transfers 5: Supervision;Other (comment)  with RW   Ambulation/Gait   Ambulation/Gait Yes   Ambulation/Gait Assistance 4: Min guard   Ambulation/Gait Assistance Details cues on posture and L foot placement   Ambulation Distance (Feet) 70 Feet  twice   Assistive device Rolling walker   Gait Pattern Step-through pattern;Decreased stride length;Decreased stance time - left;Trunk flexed;Decreased dorsiflexion - left   High Level Balance   High Level Balance Activities Side stepping;Backward walking;Other (comment)  forward walking-all in parallel bars with UE support   Lumbar Exercises: Stretches   Single Knee to Chest Stretch 3 reps;30 seconds  bilateral LE's   Double Knee to Chest Stretch 3 reps;20 seconds   Double Knee to Chest Stretch Limitations had pt assist with sheet behind knees   Lower Trunk Rotation 3 reps;20 seconds           PT Short Term Goals - 02/12/14 1701    PT SHORT TERM GOAL #1   Title Assess gait with L KAFO on LLE with RW or hemiwalker and establish goal as appropriate.   Baseline pt. amb. 48' with RW before requiring seated rest break   Time 1  Period Days   Status Achieved   PT SHORT TERM GOAL #2   Title Pt. will stand 5" with UE support as needed with CGA with assist. device   Baseline 02-13-14   Time 1   Period Days   Status Achieved   PT SHORT TERM GOAL #3   Title Independent in HEP for LLE strengthening.   Baseline 02-13-14   Time 1   Period Days   Status Achieved   PT SHORT TERM GOAL #4   Title Assess TUG with RW   Baseline 1" 37.81 secs with RW tested   Time 1   Status Achieved           PT Long Term Goals - 01/21/14 1519    PT LONG TERM GOAL #1   Title Amb. 49' with KAFO on LLE with min. assist with RW for incr. household accessibility.   Baseline 03-15-14   Time 8   Period Weeks   Status On-going    PT LONG TERM GOAL #2   Title Improve TUG by 4 secs to demo incr. functional mobility (with use of RW)   Baseline 03-15-14   Time 8   Period Weeks   Status On-going   PT LONG TERM GOAL #3   Title Don and doff KAFO with min. assist.   Baseline 03-15-14   Time 8   Status On-going   PT LONG TERM GOAL #4   Title Independent in updated HEP for LLE strengthening exercises.   Baseline 03-15-14   Time 8   Period Weeks   Status On-going            Plan - 02/13/14 1550    Clinical Impression Statement Pt continues to work hard during therapy sessions.  Continue PT per POC.   Pt will benefit from skilled therapeutic intervention in order to improve on the following deficits Abnormal gait;Decreased coordination;Decreased endurance;Decreased activity tolerance;Decreased balance;Decreased strength;Decreased mobility   Rehab Potential Good   PT Frequency 2x / week   PT Duration 8 weeks   PT Treatment/Interventions Therapeutic activities;Patient/family education;Therapeutic exercise;Gait training;Balance training;Stair training;Neuromuscular re-education;Functional mobility training   PT Next Visit Plan continue gait and balance training; begin working toward LTG's   Consulted and Agree with Plan of Care Patient        Problem List Patient Active Problem List   Diagnosis Date Noted  . Genital warts 12/13/2013  . Hyperlipidemia 03/01/2012  . Routine health maintenance 02/03/2012  . Weakness of left side of body 12/14/2011  . Greater trochanteric bursitis 12/14/2011  . ICH (intracerebral hemorrhage) 10/01/2011  . Essential hypertension, benign 10/01/2011    Narda Bonds 02/13/2014, 3:56 PM  [facility] [address] [place], [place], [address] Phone: [phone]   Fax:  [place], [place] 02/13/2014 3:57 PM Phone: [phone] Fax:  [phone]

## 2014-02-13 NOTE — Therapy (Signed)
[facility] [address] [place], [place], [address] Phone: [phone]   Fax:  [phone]  Occupational Therapy Treatment  Patient Details  Name: Jeremy [name] MRN: [id number] Date of Birth: 1965/12/28  Encounter Date: 02/12/2014      OT End of Session - 02/12/14 1202    Visit Number 8   Number of Visits 16   OT Start Time 1151   OT Stop Time 1230   OT Time Calculation (min) 39 min   Activity Tolerance Patient tolerated treatment well   Behavior During Therapy [facility] for tasks assessed/performed      Past Medical History  Diagnosis Date  . Hypertension 10/01/2011    basal ganglia  . Stroke   . High cholesterol     Past Surgical History  Procedure Laterality Date  . Tracheostomy tube placement  10/11/2011    Procedure: TRACHEOSTOMY;  Surgeon: Christia Reading, MD;  Location: [facility] OR;  Service: ENT;  Laterality: N/A;    There were no vitals taken for this visit.  Visit Diagnosis:  Lack of coordination due to stroke  Hemiplegia affecting left nondominant side      Subjective Assessment - 02/12/14 1202    Currently in Pain? No/denies       Treatment: Pt transfers to/from w/c with RW and supervision. Closed chain shoulder flexion seated edge of mat with PVC pipe frame x 20 reps, min v.c.for positioning. Functional reaching with LUE to place remove large pegs from vertical pegboard mod difficulty, min-mod facillitation for L hand coordination, manipulating pegs.                    OT Short Term Goals - 02/05/14 1240    OT SHORT TERM GOAL #1   Title Pt and wife will be I with HEP - 02/11/2014   Status On-going   OT SHORT TERM GOAL #2   Title Pt will demonstrate increased hand strength by 5 pounds in left hand to assst with functional activities.   Status On-going   OT SHORT TERM GOAL #3   Title Pt will demonstrate ability for 135 degrees of shoulder flexion for overhead reach in functional  activities   Status On-going   OT SHORT TERM GOAL #4   Title Pt will demonstrate the ability to use LUE as gross assist with basic ADL tasks   Status Achieved           OT Long Term Goals - 02/05/14 1241    OT LONG TERM GOAL #1   Title Pt and wife will be I with upgraded HEP  prn - 02/16/23/2016   Status On-going   OT LONG TERM GOAL #2   Title Pt will demonstrate increased hand strength by 7 pounds to assist with functional tasks   Status On-going   OT LONG TERM GOAL #3   Title Pt will demonstrate ability to pick up small objects with min a from table during functional tasks   Status On-going   OT LONG TERM GOAL #4   Title Pt will demonstrate ability for shoulder flexion to 140 degress to assist with functional tasks   Status On-going   OT LONG TERM GOAL #5   Title Pt will be min a with bathing at a bed level   Status On-going               Plan - 02/13/14 1250    Clinical Impression Statement Pt is progressing towards goals.   Rehab  Potential Good   Clinical Impairments Affecting Rehab Potential Pt will benefit from skilled OT to address the following:  decrease AROM, decreased strength, decreased coordination, pain in shoulder, decreased activity tolerance, pt/family education.   Plan neuro re-ed, functional reaching   Consulted and Agree with Plan of Care Patient        Problem List Patient Active Problem List   Diagnosis Date Noted  . Genital warts 12/13/2013  . Hyperlipidemia 03/01/2012  . Routine health maintenance 02/03/2012  . Weakness of left side of body 12/14/2011  . Greater trochanteric bursitis 12/14/2011  . ICH (intracerebral hemorrhage) 10/01/2011  . Essential hypertension, benign 10/01/2011    Jeremy [name] 02/13/2014, 12:54 PM Keene Breath, OTR/L [phone] Phone: [phone] 12:54 PM 12/30/2015[facility] [address] [place], [place], [address] Phone: [phone]    Fax:  [phone]

## 2014-02-14 ENCOUNTER — Telehealth: Payer: Self-pay | Admitting: *Deleted

## 2014-02-14 MED ORDER — TRAMADOL HCL 50 MG PO TABS: 50.0000 mg | ORAL_TABLET | Freq: Four times a day (QID) | ORAL | Status: DC | PRN

## 2014-02-14 NOTE — Telephone Encounter (Signed)
Need to fax RX in due to high volume of calls - [phone]

## 2014-02-14 NOTE — Telephone Encounter (Signed)
Patient called and left message asking for refill on Tramadol to be called into [facility].  Calling in a 90 day supply with 1 refill

## 2014-02-20 ENCOUNTER — Ambulatory Visit: Payer: BC Managed Care – PPO | Admitting: Occupational Therapy

## 2014-02-20 ENCOUNTER — Ambulatory Visit: Payer: BC Managed Care – PPO | Admitting: Physical Therapy

## 2014-02-21 ENCOUNTER — Encounter: Payer: Self-pay | Admitting: Occupational Therapy

## 2014-02-21 ENCOUNTER — Ambulatory Visit: Payer: BC Managed Care – PPO | Admitting: Occupational Therapy

## 2014-02-21 ENCOUNTER — Ambulatory Visit: Payer: BC Managed Care – PPO | Attending: Physical Medicine & Rehabilitation | Admitting: Physical Therapy

## 2014-02-21 ENCOUNTER — Other Ambulatory Visit: Payer: Self-pay | Admitting: *Deleted

## 2014-02-21 DIAGNOSIS — G8194 Hemiplegia, unspecified affecting left nondominant side: Secondary | ICD-10-CM

## 2014-02-21 DIAGNOSIS — R269 Unspecified abnormalities of gait and mobility: Secondary | ICD-10-CM | POA: Insufficient documentation

## 2014-02-21 DIAGNOSIS — I6930 Unspecified sequelae of cerebral infarction: Secondary | ICD-10-CM | POA: Diagnosis not present

## 2014-02-21 DIAGNOSIS — IMO0002 Reserved for concepts with insufficient information to code with codable children: Secondary | ICD-10-CM

## 2014-02-21 DIAGNOSIS — Z5189 Encounter for other specified aftercare: Secondary | ICD-10-CM | POA: Diagnosis not present

## 2014-02-21 DIAGNOSIS — R278 Other lack of coordination: Secondary | ICD-10-CM | POA: Diagnosis not present

## 2014-02-21 NOTE — Therapy (Signed)
[facility] [address] [place], [place], [address] Phone: [phone]   Fax:  [phone]  Occupational Therapy Treatment  Patient Details  Name: Jeremy [name] MRN: [id number] Date of Birth: Jun 20, 1965 Referring Provider:  Lucious Groves, DO  Encounter Date: 02/21/2014      OT End of Session - 02/21/14 1802    Visit Number 10   Number of Visits 16   OT Start Time 1324   OT Stop Time 4010   OT Time Calculation (min) 43 min   Activity Tolerance Patient tolerated treatment well      Past Medical History  Diagnosis Date  . Hypertension 10/01/2011    basal ganglia  . Stroke   . High cholesterol     Past Surgical History  Procedure Laterality Date  . Tracheostomy tube placement  10/11/2011    Procedure: TRACHEOSTOMY;  Surgeon: Melida Quitter, MD;  Location: [place];  Service: ENT;  Laterality: N/A;    There were no vitals taken for this visit.  Visit Diagnosis:  Lack of coordination due to stroke  Hemiplegia affecting left nondominant side      Subjective Assessment - 02/21/14 1632    Symptoms I do my bath lying down in my bed   Pertinent History see epic snapshot   Currently in Pain? No/denies                 OT Treatments/Exercises (OP) - 02/21/14 0001    ADLs   Bathing simulated practice of bathing at seated level (with standing at sink without brace) in order to improve pt independence with this task. Pt in agreement.  Step by step written instructions provided and pt to share with wife.  Pt in agreement to work on this over next 4 days   Functional Reaching Activities   High Level pt able to demonstrate reach to 145 degrees of shoulder flexion.  Pt also with gross grasp of 44 pounds today.                OT Education - 02/21/14 1642    Education provided Yes   Education Details ADL's   Person(s) Educated Patient   Methods Explanation;Demonstration;Handout  simulation of bathing     Comprehension Verbalized understanding;Returned demonstration          OT Short Term Goals - 02/21/14 1803    OT SHORT TERM GOAL #1   Title Pt and wife will be I with HEP - 02/11/2014   Status Achieved   OT SHORT TERM GOAL #2   Title Pt will demonstrate increased hand strength by 5 pounds in left hand to assst with functional activities.   Status Achieved   OT SHORT TERM GOAL #3   Title Pt will demonstrate ability for 135 degrees of shoulder flexion for overhead reach in functional activities   Status Achieved   OT SHORT TERM GOAL #4   Title Pt will demonstrate the ability to use LUE as gross assist with basic ADL tasks   Status Achieved           OT Long Term Goals - 02/21/14 1803    OT LONG TERM GOAL #1   Title Pt and wife will be I with upgraded HEP  prn - 02/16/23/2016   Status On-going   OT LONG TERM GOAL #2   Title Pt will demonstrate increased hand strength by 7 pounds to assist with functional tasks   Status Achieved   OT LONG TERM  GOAL #3   Title Pt will demonstrate ability to pick up small objects with min a from table during functional tasks   Status On-going   OT LONG TERM GOAL #4   Title Pt will demonstrate ability for shoulder flexion to 140 degress to assist with functional tasks   Status Achieved   OT LONG TERM GOAL #5   Title Pt will be min a with bathing at a bed level   Status On-going               Plan - 02/21/14 1802    Clinical Impression Statement Pt making excellent gains toward all goals.   Rehab Potential Good   Clinical Impairments Affecting Rehab Potential Pt will benefit from skilled OT to address the following:  decrease AROM, decreased strength, decreased coordination, pain in shoulder, decreased activity tolerance, pt/family education.   OT Frequency 2x / week   OT Duration 8 weeks   OT Treatment/Interventions Self-care/ADL training;Moist Heat;Therapeutic exercise;Neuromuscular education;Manual Therapy;Functional Mobility  Training;DME and/or AE instruction;Passive range of motion;Therapeutic activities;Patient/family education;Balance training;Therapeutic exercises   Plan check on ADL procedure given as HEP, functional reach, hand strength        Problem List Patient Active Problem List   Diagnosis Date Noted  . Genital warts 12/13/2013  . Hyperlipidemia 03/01/2012  . Routine health maintenance 02/03/2012  . Weakness of left side of body 12/14/2011  . Greater trochanteric bursitis 12/14/2011  . ICH (intracerebral hemorrhage) 10/01/2011  . Essential hypertension, benign 10/01/2011    [name], OTR/L 02/21/2014, 6:04 PM  [facility] [address] [place], [place], [address] Phone: [phone]   Fax:  [phone]

## 2014-02-21 NOTE — Patient Instructions (Signed)
New method for bathing at sink level.  This method should allow Jeremy [name] to be more independent in bathing.  Transfer to wheelchair.  Bring wheelchair to sink in kitchen.  1.  Jeremy [name] washes upper body except for back sitting in wheelchair at the sink. 2.  Jeremy [name] can wash his legs (front and back), feet and front perianal area sitting at the sink (we practiced this in the therapy session today) 3.  Have Jeremy [name] stand at the sink without his brace and see if he can wash some of his back side.  You can wash whatever part he can't reach.   Jeremy [name] reports he can dress himself.  He can continue to dress sitting at the edge of his bed.  Please start practicing this at home tomorrow. Consistency will be key to improving Jeremy [name]'s independence!

## 2014-02-22 ENCOUNTER — Encounter: Payer: Self-pay | Admitting: Physical Therapy

## 2014-02-22 MED ORDER — METOPROLOL TARTRATE 50 MG PO TABS: 50.0000 mg | ORAL_TABLET | Freq: Two times a day (BID) | ORAL | Status: DC

## 2014-02-22 NOTE — Therapy (Signed)
[facility] [address] [place], [place], [address] Phone: [phone]   Fax:  [phone]  Physical Therapy Treatment  Patient Details  Name: Jeremy [name] MRN: [id number] Date of Birth: [birth year] Referring Provider:  Lucious Groves, DO  Encounter Date: 02/21/2014      PT End of Session - 02/22/14 1024    Visit Number 10   Number of Visits 17   Date for PT Re-Evaluation 03/15/14   Authorization Type BCBS no visit limit no auth required   PT Start Time 1535   PT Stop Time 1615   PT Time Calculation (min) 40 min   Equipment Utilized During Treatment Gait belt      Past Medical History  Diagnosis Date  . Hypertension 10/01/2011    basal ganglia  . Stroke   . High cholesterol     Past Surgical History  Procedure Laterality Date  . Tracheostomy tube placement  10/11/2011    Procedure: TRACHEOSTOMY;  Surgeon: Melida Quitter, MD;  Location: [place];  Service: ENT;  Laterality: N/A;    There were no vitals taken for this visit.  Visit Diagnosis:  Hemiplegia affecting left nondominant side  Abnormality of gait      Subjective Assessment - 02/22/14 1020    Symptoms Pt. reports walking twice a day at home now   Pertinent History R CVA in August 2013; pt. received OP PT in May 2014 to address mobility and strength deficits   Currently in Pain? No/denies                    [facility] Adult PT Treatment/Exercise - 02/22/14 0001    Transfers   Transfers Sit to Stand;Stand to Sit   Sit to Stand 5: Supervision;With upper extremity assist;From chair/3-in-1   Stand to Sit 5: Supervision   Ambulation/Gait   Ambulation/Gait Yes   Ambulation/Gait Assistance 4: Min guard   Ambulation/Gait Assistance Details cues for posture and step length   Ambulation Distance (Feet) 120 Feet  2 reps   Assistive device Rolling walker   Gait Pattern Step-through pattern;Decreased stride length;Decreased stance time -  left;Trunk flexed;Decreased dorsiflexion - left   High Level Balance   High Level Balance Activities Side stepping  stepping forward and back with RLE for LLE weight-bearing                  PT Short Term Goals - 02/12/14 1701    PT SHORT TERM GOAL #1   Title Assess gait with L KAFO on LLE with RW or hemiwalker and establish goal as appropriate.   Baseline pt. amb. 31' with RW before requiring seated rest break   Time 1   Period Days   Status Achieved   PT SHORT TERM GOAL #2   Title Pt. will stand 5" with UE support as needed with CGA with assist. device   Baseline 02-13-14   Time 1   Period Days   Status Achieved   PT SHORT TERM GOAL #3   Title Independent in HEP for LLE strengthening.   Baseline 02-13-14   Time 1   Period Days   Status Achieved   PT SHORT TERM GOAL #4   Title Assess TUG with RW   Baseline 1" 37.81 secs with RW tested   Time 1   Status Achieved           PT Long Term Goals - 01/21/14 1519    PT LONG TERM GOAL #  1   Title Amb. 78' with KAFO on LLE with min. assist with RW for incr. household accessibility.   Baseline 03-15-14   Time 8   Period Weeks   Status On-going   PT LONG TERM GOAL #2   Title Improve TUG by 4 secs to demo incr. functional mobility (with use of RW)   Baseline 03-15-14   Time 8   Period Weeks   Status On-going   PT LONG TERM GOAL #3   Title Don and doff KAFO with min. assist.   Baseline 03-15-14   Time 8   Status On-going   PT LONG TERM GOAL #4   Title Independent in updated HEP for LLE strengthening exercises.   Baseline 03-15-14   Time 8   Period Weeks   Status On-going               Plan - 02/22/14 1026    Clinical Impression Statement Pt. is fearful of falling - hesitant to significantly increase activity and to attempt standing without KAFO on LLE - pt. able to physically and safely attempt this acitivty   Pt will benefit from skilled therapeutic intervention in order to improve on the following  deficits Abnormal gait;Decreased coordination;Decreased endurance;Decreased activity tolerance;Decreased balance;Decreased strength;Decreased mobility   Rehab Potential Good   PT Frequency 2x / week   PT Duration 8 weeks   PT Treatment/Interventions Therapeutic activities;Patient/family education;Therapeutic exercise;Gait training;Balance training;Stair training;Neuromuscular re-education;Functional mobility training   PT Next Visit Plan continue gait and balance training; begin working toward LTG's   Consulted and Agree with Plan of Care Patient        Problem List Patient Active Problem List   Diagnosis Date Noted  . Genital warts 12/13/2013  . Hyperlipidemia 03/01/2012  . Routine health maintenance 02/03/2012  . Weakness of left side of body 12/14/2011  . Greater trochanteric bursitis 12/14/2011  . ICH (intracerebral hemorrhage) 10/01/2011  . Essential hypertension, benign 10/01/2011    Alda Lea, PT 02/22/2014, 10:29 AM  [facility] [address] [place], [place], [address] Phone: [phone]   Fax:  [phone]

## 2014-02-25 ENCOUNTER — Ambulatory Visit: Payer: BC Managed Care – PPO | Admitting: Physical Therapy

## 2014-02-25 ENCOUNTER — Ambulatory Visit: Payer: BC Managed Care – PPO | Admitting: Occupational Therapy

## 2014-02-25 ENCOUNTER — Encounter: Payer: Self-pay | Admitting: Physical Therapy

## 2014-02-25 ENCOUNTER — Encounter: Payer: Self-pay | Admitting: Occupational Therapy

## 2014-02-25 DIAGNOSIS — G8194 Hemiplegia, unspecified affecting left nondominant side: Secondary | ICD-10-CM

## 2014-02-25 DIAGNOSIS — G8191 Hemiplegia, unspecified affecting right dominant side: Secondary | ICD-10-CM

## 2014-02-25 DIAGNOSIS — R269 Unspecified abnormalities of gait and mobility: Secondary | ICD-10-CM

## 2014-02-25 DIAGNOSIS — IMO0002 Reserved for concepts with insufficient information to code with codable children: Secondary | ICD-10-CM

## 2014-02-25 DIAGNOSIS — Z5189 Encounter for other specified aftercare: Secondary | ICD-10-CM | POA: Diagnosis not present

## 2014-02-25 NOTE — Therapy (Signed)
[facility] [address] [place], [place], [address] Phone: [phone]   Fax:  [phone]  Occupational Therapy Treatment  Patient Details  Name: Jeremy [name] MRN: [id number] Date of Birth: Mar 23, 1965 Referring Provider:  Lucious Groves, DO  Encounter Date: 02/25/2014      OT End of Session - 02/25/14 1712    Visit Number 11   Number of Visits 16   Authorization Type BCBS   OT Start Time 7564   OT Stop Time 1613   OT Time Calculation (min) 41 min   Activity Tolerance Patient tolerated treatment well      Past Medical History  Diagnosis Date  . Hypertension 10/01/2011    basal ganglia  . Stroke   . High cholesterol     Past Surgical History  Procedure Laterality Date  . Tracheostomy tube placement  10/11/2011    Procedure: TRACHEOSTOMY;  Surgeon: Melida Quitter, MD;  Location: [facility];  Service: ENT;  Laterality: N/A;    There were no vitals taken for this visit.  Visit Diagnosis:  Lack of coordination due to stroke  Right hemiplegia      Subjective Assessment - 02/25/14 1540    Symptoms I bathed every day at the sink"    Pertinent History see epic snapshot   Currently in Pain? No/denies                 OT Treatments/Exercises (OP) - 02/25/14 0001    ADLs   Bathing Pt reports he bathed every day at sink in kitchen and was far more independent (min a). Wife confirms.   Functional Mobility pt practiced walking without brace (per PT recommendation) the distance from bathroom door to toilet and back. Pt currently using bedside commode because his power chair will not fit in bathroom. Pt able to complete with supervision;  demonstrate to wife with recommendation that they place 3 in 1 over toilet and have pt walk into bathroom instead. Pt and wife in agreement.   Fine Motor Coordination   Other Fine Motor Exercises picking up small objects from table, picking up and placing coins, flipping  cards.  Pt provided with HEP for fine motor coordination                OT Education - 02/25/14 1602    Education provided Yes   Education Details fne motor coordination HEP   Person(s) Educated Patient   Methods Explanation;Demonstration;Handout   Comprehension Verbalized understanding;Returned demonstration          OT Short Term Goals - 02/25/14 1714    OT SHORT TERM GOAL #1   Title Pt and wife will be I with HEP - 02/11/2014   Status Achieved   OT SHORT TERM GOAL #2   Title Pt will demonstrate increased hand strength by 5 pounds in left hand to assst with functional activities.   Status Achieved   OT SHORT TERM GOAL #3   Title Pt will demonstrate ability for 135 degrees of shoulder flexion for overhead reach in functional activities   Status Achieved   OT SHORT TERM GOAL #4   Title Pt will demonstrate the ability to use LUE as gross assist with basic ADL tasks   Status Achieved           OT Long Term Goals - 02/25/14 1714    OT LONG TERM GOAL #1   Title Pt and wife will be I with upgraded HEP  prn -  02/16/23/2016   Status On-going   OT LONG TERM GOAL #2   Title Pt will demonstrate increased hand strength by 7 pounds to assist with functional tasks   Status Achieved   OT LONG TERM GOAL #3   Title Pt will demonstrate ability to pick up small objects with min a from table during functional tasks   Status Achieved   OT LONG TERM GOAL #4   Title Pt will demonstrate ability for shoulder flexion to 140 degress to assist with functional tasks   Status Achieved   OT LONG TERM GOAL #5   Title Pt will be min a with bathing at a bed level   Status Achieved               Plan - 02/25/14 1713    Clinical Impression Statement Pt has met most LTG''s and has made excellent progress.  Pt and wife in agreement.    Rehab Potential Good   Clinical Impairments Affecting Rehab Potential Pt will benefit from skilled OT to address the following:  decrease AROM,  decreased strength, decreased coordination, pain in shoulder, decreased activity tolerance, pt/family education.   OT Frequency 2x / week   OT Duration 8 weeks   OT Treatment/Interventions Self-care/ADL training;Moist Heat;Therapeutic exercise;Neuromuscular education;Manual Therapy;Functional Mobility Training;DME and/or AE instruction;Passive range of motion;Therapeutic activities;Patient/family education;Balance training;Therapeutic exercises   Plan finalize OT HEP, d/c from OT unless pt and wife have additional concerns.        Problem List Patient Active Problem List   Diagnosis Date Noted  . Genital warts 12/13/2013  . Hyperlipidemia 03/01/2012  . Routine health maintenance 02/03/2012  . Weakness of left side of body 12/14/2011  . Greater trochanteric bursitis 12/14/2011  . ICH (intracerebral hemorrhage) 10/01/2011  . Essential hypertension, benign 10/01/2011    [name], OTR/L 02/25/2014, 5:15 PM  [facility] [address] [place], [place], [address] Phone: [phone]   Fax:  [phone]

## 2014-02-25 NOTE — Therapy (Signed)
[facility] [address] [place], [place], [address] Phone: [phone]   Fax:  [phone]  Physical Therapy Treatment  Patient Details  Name: Jeremy [name] MRN: [id number] Date of Birth: 08/17/1965 Referring Provider:  Lucious Groves, DO  Encounter Date: 02/25/2014      PT End of Session - 02/25/14 2055    Visit Number 11   Number of Visits 17   Date for PT Re-Evaluation 03/15/14   Authorization Type BCBS no visit limit no auth required   PT Start Time 1533   PT Stop Time 1615   PT Time Calculation (min) 42 min   Equipment Utilized During Treatment Gait belt      Past Medical History  Diagnosis Date  . Hypertension 10/01/2011    basal ganglia  . Stroke   . High cholesterol     Past Surgical History  Procedure Laterality Date  . Tracheostomy tube placement  10/11/2011    Procedure: TRACHEOSTOMY;  Surgeon: Melida Quitter, MD;  Location: [facility];  Service: ENT;  Laterality: N/A;    There were no vitals taken for this visit.  Visit Diagnosis:  Abnormality of gait  Hemiplegia affecting left nondominant side      Subjective Assessment - 02/25/14 2049    Symptoms Pt. reports walking twice a day at home with brace on LLE and with RW   Currently in Pain? No/denies                    [facility] Adult PT Treatment/Exercise - 02/25/14 1550    Bed Mobility   Supine to Sit --  from half L sidelying   Transfers   Transfers Sit to Stand;Stand to Sit   Sit to Stand 5: Supervision;With upper extremity assist;From chair/3-in-1   Stand to Sit 5: Supervision   Ambulation/Gait   Ambulation/Gait Yes   Ambulation/Gait Assistance 5: Supervision   Ambulation/Gait Assistance Details cues for posture   Ambulation Distance (Feet) 10 Feet  inside bars with KAFO removed - 40' inside bars with UE A   Assistive device Parallel bars  25' x 2 reps with RW without KAFO   Gait Pattern Step-through pattern;Decreased  stride length;Decreased stance time - left;Trunk flexed;Decreased dorsiflexion - left     Discussed pt. Being able to ambulate into bathroom at home in order to be able to use commode for toileting and not having to use Bedside commode.  Pt. Stood inside parallel bars and unlocked knee portion of KAFO in standing, holding onto L bar and using R hand To remove top part of brace.  It was determined that this part of brace would still be preventing pt. From sitting down on toilet due to not lying flat but  Rather sticking out at 90 degrees. KAFO was removed and pt. Able to stand inside bars,;then amb. Inside bars without KAFO and then with RW 25' from chair to mat to simulate distance Inside home into bathroom.           PT Education - 02/25/14 2054    Education provided Yes   Education Details instructed pt. and wife to amb. into bathroom without use of KAFO on LLE with use of RW   Person(s) Educated Patient   Methods Explanation;Demonstration   Comprehension Verbalized understanding          PT Short Term Goals - 02/12/14 1701    PT SHORT TERM GOAL #1   Title Assess gait with L KAFO on  LLE with RW or hemiwalker and establish goal as appropriate.   Baseline pt. amb. 47' with RW before requiring seated rest break   Time 1   Period Days   Status Achieved   PT SHORT TERM GOAL #2   Title Pt. will stand 5" with UE support as needed with CGA with assist. device   Baseline 02-13-14   Time 1   Period Days   Status Achieved   PT SHORT TERM GOAL #3   Title Independent in HEP for LLE strengthening.   Baseline 02-13-14   Time 1   Period Days   Status Achieved   PT SHORT TERM GOAL #4   Title Assess TUG with RW   Baseline 1" 37.81 secs with RW tested   Time 1   Status Achieved           PT Long Term Goals - 01/21/14 1519    PT LONG TERM GOAL #1   Title Amb. 74' with KAFO on LLE with min. assist with RW for incr. household accessibility.   Baseline 03-15-14   Time 8    Period Weeks   Status On-going   PT LONG TERM GOAL #2   Title Improve TUG by 4 secs to demo incr. functional mobility (with use of RW)   Baseline 03-15-14   Time 8   Period Weeks   Status On-going   PT LONG TERM GOAL #3   Title Don and doff KAFO with min. assist.   Baseline 03-15-14   Time 8   Status On-going   PT LONG TERM GOAL #4   Title Independent in updated HEP for LLE strengthening exercises.   Baseline 03-15-14   Time 8   Period Weeks   Status On-going               Plan - 02/25/14 2056    Clinical Impression Statement  - left knee did not buckle; mild left foot drop noted with decr. eccentric dorsiflexion control   Pt will benefit from skilled therapeutic intervention in order to improve on the following deficits Abnormal gait;Decreased coordination;Decreased endurance;Decreased activity tolerance;Decreased balance;Decreased strength;Decreased mobility   Rehab Potential Good   PT Frequency 2x / week   PT Duration 8 weeks   PT Treatment/Interventions Therapeutic activities;Patient/family education;Therapeutic exercise;Gait training;Balance training;Stair training;Neuromuscular re-education;Functional mobility training   PT Next Visit Plan continue gait and balance training; begin working toward LTG's   Consulted and Agree with Plan of Care Patient     Pt. Able to ambulate safely without use of KAFO on LLE - no buckling of left knee and only mild foot drop with decr. Eccentric control  with some mild inversion noted, but not impeding safety with gait.  Pt. Instructed to begin amb. Into  Bathroom at home with  Wife's supervision initially.   Problem List Patient Active Problem List   Diagnosis Date Noted  . Genital warts 12/13/2013  . Hyperlipidemia 03/01/2012  . Routine health maintenance 02/03/2012  . Weakness of left side of body 12/14/2011  . Greater trochanteric bursitis 12/14/2011  . ICH (intracerebral hemorrhage) 10/01/2011  . Essential hypertension,  benign 10/01/2011    Alda Lea, PT 02/25/2014, 9:00 PM  [facility] [address] [place], [place], [address] Phone: [phone]   Fax:  [phone]

## 2014-02-25 NOTE — Patient Instructions (Signed)
  Coordination Activities  Perform the following activities for 15-0 minutes 1-2 times per day with left hand(s).   Flip cards 1 at a time as fast as you can.  Pick up coins, buttons, marbles, dried beans/pasta of different sizes and place in container.  Pick up coins and place in container or coin bank.  Screw together nuts and bolts, then unfasten.  Start with bigger nuts and bolts and work toward smaller ones.

## 2014-02-28 ENCOUNTER — Encounter: Payer: Self-pay | Admitting: Physical Therapy

## 2014-02-28 ENCOUNTER — Ambulatory Visit: Payer: BC Managed Care – PPO | Admitting: Occupational Therapy

## 2014-02-28 ENCOUNTER — Ambulatory Visit: Payer: BC Managed Care – PPO | Admitting: Physical Therapy

## 2014-02-28 DIAGNOSIS — Z5189 Encounter for other specified aftercare: Secondary | ICD-10-CM | POA: Diagnosis not present

## 2014-02-28 DIAGNOSIS — I6930 Unspecified sequelae of cerebral infarction: Secondary | ICD-10-CM

## 2014-02-28 DIAGNOSIS — G8191 Hemiplegia, unspecified affecting right dominant side: Secondary | ICD-10-CM

## 2014-02-28 DIAGNOSIS — G8194 Hemiplegia, unspecified affecting left nondominant side: Secondary | ICD-10-CM

## 2014-02-28 DIAGNOSIS — R269 Unspecified abnormalities of gait and mobility: Secondary | ICD-10-CM

## 2014-02-28 DIAGNOSIS — IMO0002 Reserved for concepts with insufficient information to code with codable children: Secondary | ICD-10-CM

## 2014-02-28 NOTE — Therapy (Signed)
[facility] [address] [place], [place], [address] Phone: [phone]   Fax:  [phone]  Occupational Therapy Treatment  Patient Details  Name: Jeremy [name] MRN: [id number] Date of Birth: Feb 11, 1966 Referring Provider:  Lucious Groves, DO  Encounter Date: 02/28/2014      OT End of Session - 02/28/14 1702    Visit Number 12   Number of Visits 16   OT Start Time 2297   OT Stop Time 1655   OT Time Calculation (min) 39 min      Past Medical History  Diagnosis Date  . Hypertension 10/01/2011    basal ganglia  . Stroke   . High cholesterol     Past Surgical History  Procedure Laterality Date  . Tracheostomy tube placement  10/11/2011    Procedure: TRACHEOSTOMY;  Surgeon: Melida Quitter, MD;  Location: [facility];  Service: ENT;  Laterality: N/A;    There were no vitals taken for this visit.  Visit Diagnosis:  Lack of coordination due to stroke  Hemiplegia affecting left nondominant side      Subjective Assessment - 02/28/14 1642    Symptoms I need more practice to walk to the bathroom   Pertinent History see epic snapshot   Currently in Pain? No/denies                 OT Treatments/Exercises (OP) - 02/28/14 0001    ADLs   Functional Mobility pt able to walk without LE brace 10 ft x2 in order to be able to walk into bathroom. Pt reluctant and stated he needed more practice. Pointed out to pt that he walked 90 feet in PT without brace.  Pt in agreement to begin walking to bathroom at home without brace.   Exercises   Exercises Shoulder   Shoulder Exercises: Supine   Other Supine Exercises Reviewed entire HEP with pt including coordination. UE strengthening and theraputty exercises.  Also discussed ways pt could increase HEP as he continues to progress at home and provided this in writing. Pt verbalized understanding and is in agreement. Reviewed with wife as well.                  OT  Education - 02/28/14 1702    Education provided Yes   Education Details Upgrading HEP   Person(s) Educated Patient;Spouse   Methods Explanation;Demonstration;Handout   Comprehension Verbalized understanding          OT Short Term Goals - 02/28/14 1703    OT SHORT TERM GOAL #1   Title Pt and wife will be I with HEP - 02/11/2014   Status Achieved   OT SHORT TERM GOAL #2   Title Pt will demonstrate increased hand strength by 5 pounds in left hand to assst with functional activities.   Status Achieved   OT SHORT TERM GOAL #3   Title Pt will demonstrate ability for 135 degrees of shoulder flexion for overhead reach in functional activities   Status Achieved   OT SHORT TERM GOAL #4   Title Pt will demonstrate the ability to use LUE as gross assist with basic ADL tasks   Status Achieved           OT Long Term Goals - 02/28/14 1703    OT LONG TERM GOAL #1   Title Pt and wife will be I with upgraded HEP  prn - 02/16/23/2016   Status Achieved   OT LONG TERM GOAL #2  Title Pt will demonstrate increased hand strength by 7 pounds to assist with functional tasks   Status Achieved   OT LONG TERM GOAL #3   Title Pt will demonstrate ability to pick up small objects with min a from table during functional tasks   Status Achieved   OT LONG TERM GOAL #4   Title Pt will demonstrate ability for shoulder flexion to 140 degress to assist with functional tasks   Status Achieved   OT LONG TERM GOAL #5   Title Pt will be min a with bathing at a bed level  pt completing at sink level   Status Achieved               Plan - 02/28/14 1702    Clinical Impression Statement Pt has met all LTG's and has made significant progress.  Pt and wife are pleased with pt progress.   Rehab Potential Good   Clinical Impairments Affecting Rehab Potential Pt will benefit from skilled OT to address the following:  decrease AROM, decreased strength, decreased coordination, pain in shoulder, decreased  activity tolerance, pt/family education.   OT Frequency 2x / week   OT Duration 8 weeks   OT Treatment/Interventions Self-care/ADL training;Moist Heat;Therapeutic exercise;Neuromuscular education;Manual Therapy;Functional Mobility Training;DME and/or AE instruction;Passive range of motion;Therapeutic activities;Patient/family education;Balance training;Therapeutic exercises   Plan D/C from OT services today        Problem List Patient Active Problem List   Diagnosis Date Noted  . Genital warts 12/13/2013  . Hyperlipidemia 03/01/2012  . Routine health maintenance 02/03/2012  . Weakness of left side of body 12/14/2011  . Greater trochanteric bursitis 12/14/2011  . ICH (intracerebral hemorrhage) 10/01/2011  . Essential hypertension, benign 10/01/2011   OCCUPATIONAL THERAPY DISCHARGE SUMMARY  Visits from Start of Care: 12  Current functional level related to goals / functional outcomes: See LTG's above   Remaining deficits: 1. Left hemiplegia 2. Decreased activity tolerance 3. Ataxia    Education / Equipment: HEP  Plan: Patient agrees to discharge.  Patient goals were met. Patient is being discharged due to meeting the stated rehab goals.  ?????     [name], OTR/L 02/28/2014, 5:06 PM  [facility] [address] [place], [place], [address] Phone: [phone]   Fax:  [phone]

## 2014-02-28 NOTE — Patient Instructions (Signed)
Ways to increase or upgrade your Home Exercise Program:  As you continue to get stronger, you can challenge yourself in your home program by the following ways.  Increase only one thing at a time. If you get pain, back down and go slower. You can alternate days and challenge yourself every other day and work toward challenging every day.   Coordination: 1. Increase the time you work on it.  2. Increase the repetitions 3. Increase the speed of the activity 4. Use smaller items (i.e nuts and bolts, smaller beans, etc)  Arm Exercises: 1. Increase the repetitions 2.  Increase the weight 3. Increase the amount of time you hold (right now holding for count of 5 so see if you can hold for count 10, then 15 and keep going!) 4. Go slower  Theraputty: 1.  Increase the repetitions 2. Increase the time you spend using the theraputty

## 2014-02-28 NOTE — Therapy (Signed)
[facility] [address] [place], [place], [address] Phone: [phone]   Fax:  [phone]  Physical Therapy Treatment  Patient Details  Name: Jeremy [name] MRN: [id number] Date of Birth: Feb 05, 1966 Referring Provider:  Lucious Groves, DO  Encounter Date: 02/28/2014      PT End of Session - 02/28/14 1626    Visit Number 12   Number of Visits 17   Date for PT Re-Evaluation 03/15/14   Authorization Type BCBS no visit limit no auth required   PT Start Time 1532   PT Stop Time 1615   PT Time Calculation (min) 43 min   Equipment Utilized During Treatment Gait belt   Activity Tolerance Patient tolerated treatment well   Behavior During Therapy [facility] for tasks assessed/performed      Past Medical History  Diagnosis Date  . Hypertension 10/01/2011    basal ganglia  . Stroke   . High cholesterol     Past Surgical History  Procedure Laterality Date  . Tracheostomy tube placement  10/11/2011    Procedure: TRACHEOSTOMY;  Surgeon: Melida Quitter, MD;  Location: [facility];  Service: ENT;  Laterality: N/A;    There were no vitals taken for this visit.  Visit Diagnosis:  Abnormality of gait  Lack of coordination due to stroke  History of CVA with residual deficit  Right hemiplegia  Hemiplegia affecting left nondominant side      Subjective Assessment - 02/28/14 1536    Symptoms Has not walked at home without brace as yet after practice in clinic last session. No fall or pain to report. Says his back muscles are not sore, however "I feel them".   Currently in Pain? No/denies          [facility] Adult PT Treatment/Exercise - 02/28/14 1538    Transfers   Sit to Stand 5: Supervision;With upper extremity assist;From chair/3-in-1;With armrests   Stand to Sit 5: Supervision;With upper extremity assist;With armrests;To chair/3-in-1   Ambulation/Gait   Ambulation/Gait Yes   Ambulation/Gait Assistance 4: Min guard   Ambulation/Gait Assistance Details in parallel bars x 4 laps without brace with min guard assist and cues on posture. with RW: cues on posture and increased right step length.                          Ambulation Distance (Feet) 15 Feet  x1, 43ft x1, 49ft x1 all with RW   Assistive device Rolling walker;Parallel bars   Gait Pattern Step-to pattern;Step-through pattern;Decreased stride length;Decreased step length - right;Decreased stance time - left   Ambulation Surface Level;Indoor   Gait velocity decreased     Self Care: Pt supervision to doff KAFO today and min assist to don KAFO at end of session. Cues to use left hand as well with brace application.       PT Short Term Goals - 02/12/14 1701    PT SHORT TERM GOAL #1   Title Assess gait with L KAFO on LLE with RW or hemiwalker and establish goal as appropriate.   Baseline pt. amb. 27' with RW before requiring seated rest break   Time 1   Period Days   Status Achieved   PT SHORT TERM GOAL #2   Title Pt. will stand 5" with UE support as needed with CGA with assist. device   Baseline 02-13-14   Time 1   Period Days   Status Achieved   PT SHORT TERM GOAL #  3   Title Independent in HEP for LLE strengthening.   Baseline 02-13-14   Time 1   Period Days   Status Achieved   PT SHORT TERM GOAL #4   Title Assess TUG with RW   Baseline 1" 37.81 secs with RW tested   Time 1   Status Achieved           PT Long Term Goals - 01/21/14 1519    PT LONG TERM GOAL #1   Title Amb. 68' with KAFO on LLE with min. assist with RW for incr. household accessibility.   Baseline 03-15-14   Time 8   Period Weeks   Status On-going   PT LONG TERM GOAL #2   Title Improve TUG by 4 secs to demo incr. functional mobility (with use of RW)   Baseline 03-15-14   Time 8   Period Weeks   Status On-going   PT LONG TERM GOAL #3   Title Don and doff KAFO with min. assist.   Baseline 03-15-14   Time 8   Status On-going   PT LONG TERM GOAL #4   Title  Independent in updated HEP for LLE strengthening exercises.   Baseline 03-15-14   Time 8   Period Weeks   Status On-going            Plan - 02/28/14 1627    Clinical Impression Statement Continued to work on gait without brace. No buckling noted today as well with continued mild left foot drop. Did demo enough foot clearance to not cause balance issues with gait.   Pt will benefit from skilled therapeutic intervention in order to improve on the following deficits Abnormal gait;Decreased coordination;Decreased endurance;Decreased activity tolerance;Decreased balance;Decreased strength;Decreased mobility   Rehab Potential Good   PT Frequency 2x / week   PT Duration 8 weeks   PT Treatment/Interventions Therapeutic activities;Patient/family education;Therapeutic exercise;Gait training;Balance training;Stair training;Neuromuscular re-education;Functional mobility training   PT Next Visit Plan continue gait and balance training; begin working toward LTG's   Consulted and Agree with Plan of Care Patient        Problem List Patient Active Problem List   Diagnosis Date Noted  . Genital warts 12/13/2013  . Hyperlipidemia 03/01/2012  . Routine health maintenance 02/03/2012  . Weakness of left side of body 12/14/2011  . Greater trochanteric bursitis 12/14/2011  . ICH (intracerebral hemorrhage) 10/01/2011  . Essential hypertension, benign 10/01/2011    Willow Ora 02/28/2014, 4:30 PM  Willow Ora, PTA, [facility] [address] [place], [place] [address] [phone] 02/28/2014, 4:31 PM

## 2014-03-05 ENCOUNTER — Encounter: Payer: Self-pay | Admitting: Physical Therapy

## 2014-03-05 ENCOUNTER — Ambulatory Visit: Payer: BC Managed Care – PPO | Admitting: Occupational Therapy

## 2014-03-05 ENCOUNTER — Ambulatory Visit: Payer: BC Managed Care – PPO | Admitting: Physical Therapy

## 2014-03-05 ENCOUNTER — Ambulatory Visit: Payer: BC Managed Care – PPO | Admitting: Neurology

## 2014-03-05 DIAGNOSIS — Z5189 Encounter for other specified aftercare: Secondary | ICD-10-CM | POA: Diagnosis not present

## 2014-03-05 DIAGNOSIS — R269 Unspecified abnormalities of gait and mobility: Secondary | ICD-10-CM

## 2014-03-05 DIAGNOSIS — G8194 Hemiplegia, unspecified affecting left nondominant side: Secondary | ICD-10-CM

## 2014-03-05 NOTE — Therapy (Signed)
[facility] [address] [place], [place], [address] Phone: [phone]   Fax:  [phone]  Physical Therapy Treatment  Patient Details  Name: Jeremy [name] MRN: [id number] Date of Birth: [birth year] Referring Provider:  Lucious Groves, DO  Encounter Date: 03/05/2014      PT End of Session - 03/05/14 1704    Visit Number 13   Number of Visits 17   Date for PT Re-Evaluation 03/15/14   Authorization Type BCBS no visit limit no auth required   PT Start Time 1312   PT Stop Time 1401   PT Time Calculation (min) 49 min   Equipment Utilized During Treatment Gait belt      Past Medical History  Diagnosis Date  . Hypertension 10/01/2011    basal ganglia  . Stroke   . High cholesterol     Past Surgical History  Procedure Laterality Date  . Tracheostomy tube placement  10/11/2011    Procedure: TRACHEOSTOMY;  Surgeon: Melida Quitter, MD;  Location: [facility];  Service: ENT;  Laterality: N/A;    There were no vitals taken for this visit.  Visit Diagnosis:  Abnormality of gait  Hemiplegia affecting left nondominant side      Subjective Assessment - 03/05/14 1658    Symptoms Pt. states his RW will not fit through his bathroom door going straight ahead so he did not attempt to ambulate into his bathroom yet; he does report amb. 3 times during the day without wearing brace   Pertinent History R CVA in August 2013; pt. received OP PT in May 2014 to address mobility and strength deficits   Currently in Pain? No/denies                    [facility] Adult PT Treatment/Exercise - 03/05/14 1701    Transfers   Sit to Stand 5: Supervision;With upper extremity assist;From chair/3-in-1;With armrests   Stand to Sit 5: Supervision;With upper extremity assist;With armrests;To chair/3-in-1   Ambulation/Gait   Ambulation/Gait Yes   Ambulation/Gait Assistance 4: Min guard  CGA   Ambulation Distance (Feet) 120 Feet    Assistive device Rolling walker;Parallel bars   Gait Pattern Step-to pattern;Step-through pattern;Decreased stride length;Decreased step length - right;Decreased stance time - left   Ambulation Surface Level   Knee/Hip Exercises: Aerobic   Stationary Bike scifit 3" level 1.5     Gait; simulated side-stepping with RW in order to access pt's bathroom in his home - pt. Performed sidestepping with RW Toward left side 14'x1 with CGA and cues for placement of feet inside RW; 14'x1 toward right side with RW with CGA with  KAFO removed prior to start of PT session; pt. Did well without use of KAFO and was informed that he did not need to  Continue to bring it to PT  TherEx:  L hamstring stetching in seated position 30 sec hold; standing runner's stretch x 30 sec hold inside parallel bars for  L hamstring and heel cord stretch L LAQ with green theraband x 10 reps 3 sec hold           PT Education - 03/05/14 1703    Education provided Yes   Education Details pt. given 1 yard green theraband to perform seated L LAQ's - 10 reps 3 x/day 3 sec hold in extension   Person(s) Educated Patient   Methods Explanation;Demonstration   Comprehension Verbalized understanding;Returned demonstration          PT Short  Term Goals - 02/12/14 1701    PT SHORT TERM GOAL #1   Title Assess gait with L KAFO on LLE with RW or hemiwalker and establish goal as appropriate.   Baseline pt. amb. 3' with RW before requiring seated rest break   Time 1   Period Days   Status Achieved   PT SHORT TERM GOAL #2   Title Pt. will stand 5" with UE support as needed with CGA with assist. device   Baseline 02-13-14   Time 1   Period Days   Status Achieved   PT SHORT TERM GOAL #3   Title Independent in HEP for LLE strengthening.   Baseline 02-13-14   Time 1   Period Days   Status Achieved   PT SHORT TERM GOAL #4   Title Assess TUG with RW   Baseline 1" 37.81 secs with RW tested   Time 1   Status Achieved            PT Long Term Goals - 01/21/14 1519    PT LONG TERM GOAL #1   Title Amb. 26' with KAFO on LLE with min. assist with RW for incr. household accessibility.   Baseline 03-15-14   Time 8   Period Weeks   Status On-going   PT LONG TERM GOAL #2   Title Improve TUG by 4 secs to demo incr. functional mobility (with use of RW)   Baseline 03-15-14   Time 8   Period Weeks   Status On-going   PT LONG TERM GOAL #3   Title Don and doff KAFO with min. assist.   Baseline 03-15-14   Time 8   Status On-going   PT LONG TERM GOAL #4   Title Independent in updated HEP for LLE strengthening exercises.   Baseline 03-15-14   Time 8   Period Weeks   Status On-going               Plan - 03/05/14 1705    Clinical Impression Statement Pt. did well with ambulation today without use of KAFO on LLE: worked on sidestepping 12' outside bars with no LOB noted; pt. was informed that he did not need to bring KAFO to pt anymore - he is doing fine without use of orthotic   Pt will benefit from skilled therapeutic intervention in order to improve on the following deficits Abnormal gait;Decreased coordination;Decreased endurance;Decreased activity tolerance;Decreased balance;Decreased strength;Decreased mobility   Rehab Potential Good   PT Frequency 2x / week   PT Duration 8 weeks   PT Treatment/Interventions Therapeutic activities;Patient/family education;Therapeutic exercise;Gait training;Balance training;Stair training;Neuromuscular re-education;Functional mobility training   PT Next Visit Plan continue gait and balance training; begin working toward LTG's   Consulted and Agree with Plan of Care Patient   Family Member Consulted wife        Problem List Patient Active Problem List   Diagnosis Date Noted  . Genital warts 12/13/2013  . Hyperlipidemia 03/01/2012  . Routine health maintenance 02/03/2012  . Weakness of left side of body 12/14/2011  . Greater trochanteric bursitis 12/14/2011   . ICH (intracerebral hemorrhage) 10/01/2011  . Essential hypertension, benign 10/01/2011    Alda Lea, PT 03/05/2014, 5:09 PM  [facility] [address] [place], [place], [address] Phone: [phone]   Fax:  [phone]

## 2014-03-07 ENCOUNTER — Ambulatory Visit: Payer: BC Managed Care – PPO | Admitting: Physical Therapy

## 2014-03-07 ENCOUNTER — Encounter: Payer: BC Managed Care – PPO | Admitting: Occupational Therapy

## 2014-03-08 ENCOUNTER — Ambulatory Visit: Payer: BC Managed Care – PPO | Admitting: Physical Medicine & Rehabilitation

## 2014-03-11 ENCOUNTER — Ambulatory Visit: Payer: BC Managed Care – PPO | Admitting: Physical Therapy

## 2014-03-11 ENCOUNTER — Encounter: Payer: BC Managed Care – PPO | Admitting: Occupational Therapy

## 2014-03-14 ENCOUNTER — Encounter: Payer: Self-pay | Admitting: Physical Therapy

## 2014-03-14 ENCOUNTER — Encounter: Payer: BC Managed Care – PPO | Admitting: Occupational Therapy

## 2014-03-14 ENCOUNTER — Ambulatory Visit: Payer: BC Managed Care – PPO | Admitting: Physical Therapy

## 2014-03-14 DIAGNOSIS — Z5189 Encounter for other specified aftercare: Secondary | ICD-10-CM | POA: Diagnosis not present

## 2014-03-14 DIAGNOSIS — R269 Unspecified abnormalities of gait and mobility: Secondary | ICD-10-CM

## 2014-03-14 DIAGNOSIS — G8194 Hemiplegia, unspecified affecting left nondominant side: Secondary | ICD-10-CM

## 2014-03-15 ENCOUNTER — Encounter: Payer: Self-pay | Admitting: Physical Therapy

## 2014-03-15 NOTE — Therapy (Signed)
[facility] [address] [place], [place], [address] Phone: [phone]   Fax:  [phone]  Physical Therapy Treatment  Patient Details  Name: Jeremy [name] MRN: [id number] Date of Birth: January 24, 1966 Referring Provider:  Lucious Groves, DO  Encounter Date: 03/14/2014      PT End of Session - 03/15/14 0916    Visit Number 14   Number of Visits 17   Date for PT Re-Evaluation 03/29/14   Authorization Type BCBS no visit limit no auth required   PT Start Time 8453   PT Stop Time 1532   PT Time Calculation (min) 47 min   Equipment Utilized During Treatment Gait belt      Past Medical History  Diagnosis Date  . Hypertension 10/01/2011    basal ganglia  . Stroke   . High cholesterol     Past Surgical History  Procedure Laterality Date  . Tracheostomy tube placement  10/11/2011    Procedure: TRACHEOSTOMY;  Surgeon: Melida Quitter, MD;  Location: [facility];  Service: ENT;  Laterality: N/A;    There were no vitals taken for this visit.  Visit Diagnosis:  Abnormality of gait  Hemiplegia affecting left nondominant side      Subjective Assessment - 03/15/14 0912    Symptoms Pt. states he was able to get into his bathroom by sidestepping with the RW, but once inside he does not have room to turn around to step over into tub; also he does not hae a shower bench or chair yet   Pertinent History R CVA in August 2013; pt. received OP PT in May 2014 to address mobility and strength deficits   Currently in Pain? No/denies                    [facility] Adult PT Treatment/Exercise - 03/15/14 0001    Transfers   Sit to Stand 5: Supervision;With upper extremity assist;From chair/3-in-1;With armrests   Stand to Sit 5: Supervision;With upper extremity assist;With armrests;To chair/3-in-1   Ambulation/Gait   Ambulation/Gait Yes   Ambulation/Gait Assistance 4: Min guard  CGA   Assistive device Rolling walker;Parallel bars    Gait Pattern Step-to pattern;Step-through pattern;Decreased stride length;Decreased step length - right;Decreased stance time - left   Ambulation Surface Level     TherAct; tub transfer bench set up to simulate pt.'s bathroom at home - pt. Performed sidestepping with RW 15'    Up to tub bench set up in simulated tub; pt. Performed stand to sit transfer onto bench and transferred LE's over "into tub" and performed scooting on bench to simulate tub transfer at home; pt. Then transferred out of tub to RW and  Sidestepped with RW 15' to simulate exiting bathroom at home  Gait; pt. Gait trained without KAFO on LLE with RW 120' with cues for step length and posture; inside bars 10' x 4 Reps with RUE support only with CGA  Pt. Reported fatigue at end of session and declined riding Sci[employer]            PT Education - 03/15/14 0915    Education provided Yes   Education Details Instructed pt to incr. ambulation distance in the home by adding walking in hallway to current route, i.e. bedroom to front room down hallway and back   Person(s) Educated Patient   Methods Explanation   Comprehension Verbalized understanding          PT Short Term Goals - 02/12/14 1701  PT SHORT TERM GOAL #1   Title Assess gait with L KAFO on LLE with RW or hemiwalker and establish goal as appropriate.   Baseline pt. amb. 40' with RW before requiring seated rest break   Time 1   Period Days   Status Achieved   PT SHORT TERM GOAL #2   Title Pt. will stand 5" with UE support as needed with CGA with assist. device   Baseline 02-13-14   Time 1   Period Days   Status Achieved   PT SHORT TERM GOAL #3   Title Independent in HEP for LLE strengthening.   Baseline 02-13-14   Time 1   Period Days   Status Achieved   PT SHORT TERM GOAL #4   Title Assess TUG with RW   Baseline 1" 37.81 secs with RW tested   Time 1   Status Achieved           PT Long Term Goals - 03/15/14 2957    PT LONG TERM GOAL #7    Title Pt. will report ambulating at home at least 4' with RW modified independently 2x/day.   Baseline 03-29-14   Time 2   Period Weeks   Status New               Plan - 03/15/14 4734    Clinical Impression Statement Pt. did wel with sidestepping to simulate walking into bathroom at home and then sitting on tub bench to simulate tub transfer; pt appearing more confident and less fearful of falling   Pt will benefit from skilled therapeutic intervention in order to improve on the following deficits Abnormal gait;Decreased coordination;Decreased endurance;Decreased activity tolerance;Decreased balance;Decreased strength;Decreased mobility   Rehab Potential Good   PT Frequency 2x / week   PT Duration 8 weeks  extended 2 weeks due to decr. attendance due to weather   PT Treatment/Interventions Therapeutic activities;Patient/family education;Therapeutic exercise;Gait training;Balance training;Stair training;Neuromuscular re-education;Functional mobility training   PT Next Visit Plan continue balance and gait   Consulted and Agree with Plan of Care Patient   Family Member Consulted wife        Problem List Patient Active Problem List   Diagnosis Date Noted  . Genital warts 12/13/2013  . Hyperlipidemia 03/01/2012  . Routine health maintenance 02/03/2012  . Weakness of left side of body 12/14/2011  . Greater trochanteric bursitis 12/14/2011  . ICH (intracerebral hemorrhage) 10/01/2011  . Essential hypertension, benign 10/01/2011    Alda Lea, PT 03/15/2014, 9:25 AM  [facility] [address] [place], [place], [address] Phone: [phone]   Fax:  [phone]

## 2014-03-19 ENCOUNTER — Ambulatory Visit: Payer: BC Managed Care – PPO | Attending: Physical Medicine & Rehabilitation | Admitting: Physical Therapy

## 2014-03-19 DIAGNOSIS — R278 Other lack of coordination: Secondary | ICD-10-CM | POA: Insufficient documentation

## 2014-03-19 DIAGNOSIS — G8194 Hemiplegia, unspecified affecting left nondominant side: Secondary | ICD-10-CM

## 2014-03-19 DIAGNOSIS — R269 Unspecified abnormalities of gait and mobility: Secondary | ICD-10-CM | POA: Diagnosis not present

## 2014-03-19 DIAGNOSIS — Z5189 Encounter for other specified aftercare: Secondary | ICD-10-CM | POA: Diagnosis present

## 2014-03-19 DIAGNOSIS — I6930 Unspecified sequelae of cerebral infarction: Secondary | ICD-10-CM | POA: Insufficient documentation

## 2014-03-20 NOTE — Therapy (Signed)
[facility] [address] [place], [place], [address] Phone: [phone]   Fax:  [phone]  Physical Therapy Treatment  Patient Details  Name: Jeremy [name] MRN: [id number] Date of Birth: 22-Jun-1965 Referring Provider:  Lucious Groves, DO  Encounter Date: 03/19/2014      PT End of Session - 03/20/14 1005    Visit Number 15   Number of Visits 17   Date for PT Re-Evaluation 03/29/14   Authorization Type BCBS no visit limit no auth required   PT Start Time 1153   PT Stop Time 1240   PT Time Calculation (min) 47 min   Equipment Utilized During Treatment Gait belt      Past Medical History  Diagnosis Date  . Hypertension 10/01/2011    basal ganglia  . Stroke   . High cholesterol     Past Surgical History  Procedure Laterality Date  . Tracheostomy tube placement  10/11/2011    Procedure: TRACHEOSTOMY;  Surgeon: Melida Quitter, MD;  Location: [facility];  Service: ENT;  Laterality: N/A;    There were no vitals taken for this visit.  Visit Diagnosis:  Abnormality of gait  Hemiplegia affecting left nondominant side      Subjective Assessment - 03/20/14 1004    Symptoms Pt. states walking further distance at home - added hallway to current route; still hasn't obtained tub bench yet   Pertinent History R CVA in August 2013; pt. received OP PT in May 2014 to address mobility and strength deficits   Currently in Pain? No/denies      TherEx; standing Left hip extension and abduction with green theraband x 10 reps each inside bars; standing stepper Inside bars for bil. UE support x 30 secs Nustep level 3 x 5"  Gait; with RW 120' x 2 reps with cues for posture and step length - no KAFO used on LLE; pt. modifed independent  with sit to stand transfers from wheelchair; pt. Declined attempting step negotiation today                        PT Short Term Goals - 02/12/14 1701    PT SHORT TERM GOAL #1    Title Assess gait with L KAFO on LLE with RW or hemiwalker and establish goal as appropriate.   Baseline pt. amb. 64' with RW before requiring seated rest break   Time 1   Period Days   Status Achieved   PT SHORT TERM GOAL #2   Title Pt. will stand 5" with UE support as needed with CGA with assist. device   Baseline 02-13-14   Time 1   Period Days   Status Achieved   PT SHORT TERM GOAL #3   Title Independent in HEP for LLE strengthening.   Baseline 02-13-14   Time 1   Period Days   Status Achieved   PT SHORT TERM GOAL #4   Title Assess TUG with RW   Baseline 1" 37.81 secs with RW tested   Time 1   Status Achieved           PT Long Term Goals - 03/15/14 1287    PT LONG TERM GOAL #7   Title Pt. will report ambulating at home at least 50' with RW modified independently 2x/day.   Baseline 03-29-14   Time 2   Period Weeks   Status New  Plan - 03/20/14 1005    Clinical Impression Statement pt. doing well with ambulation without use of KAFO - no longer wears it - pt. is increasing ambulation distance in home per his report   Pt will benefit from skilled therapeutic intervention in order to improve on the following deficits Abnormal gait;Decreased coordination;Decreased endurance;Decreased activity tolerance;Decreased balance;Decreased strength;Decreased mobility   Rehab Potential Good   PT Frequency 2x / week   PT Duration 8 weeks   PT Treatment/Interventions Therapeutic activities;Patient/family education;Therapeutic exercise;Gait training;Balance training;Stair training;Neuromuscular re-education;Functional mobility training   PT Next Visit Plan continue balance and gait   Consulted and Agree with Plan of Care Patient        Problem List Patient Active Problem List   Diagnosis Date Noted  . Genital warts 12/13/2013  . Hyperlipidemia 03/01/2012  . Routine health maintenance 02/03/2012  . Weakness of left side of body 12/14/2011  . Greater  trochanteric bursitis 12/14/2011  . ICH (intracerebral hemorrhage) 10/01/2011  . Essential hypertension, benign 10/01/2011    Alda Lea, PT 03/20/2014, 10:09 AM  [facility] [address] [place], [place], [address] Phone: [phone]   Fax:  [phone]

## 2014-03-21 ENCOUNTER — Encounter: Payer: BC Managed Care – PPO | Admitting: Internal Medicine

## 2014-03-25 ENCOUNTER — Ambulatory Visit: Payer: BC Managed Care – PPO | Admitting: Physical Therapy

## 2014-03-25 DIAGNOSIS — Z5189 Encounter for other specified aftercare: Secondary | ICD-10-CM | POA: Diagnosis not present

## 2014-03-25 DIAGNOSIS — G8194 Hemiplegia, unspecified affecting left nondominant side: Secondary | ICD-10-CM

## 2014-03-25 DIAGNOSIS — R269 Unspecified abnormalities of gait and mobility: Secondary | ICD-10-CM

## 2014-03-26 ENCOUNTER — Encounter: Payer: Self-pay | Admitting: Physical Therapy

## 2014-03-26 NOTE — Therapy (Signed)
[facility] [address] [place], [place], [address] Phone: [phone]   Fax:  [phone]  Physical Therapy Treatment  Patient Details  Name: Jeremy [name] MRN: [id number] Date of Birth: [birth year] Referring Provider:  Lucious Groves, DO  Encounter Date: 03/25/2014      PT End of Session - 03/26/14 0829    Visit Number 16   Number of Visits 17   Date for PT Re-Evaluation 03/29/14   Authorization Type BCBS no visit limit no auth required   PT Start Time 1532   PT Stop Time 1618   PT Time Calculation (min) 46 min      Past Medical History  Diagnosis Date  . Hypertension 10/01/2011    basal ganglia  . Stroke   . High cholesterol     Past Surgical History  Procedure Laterality Date  . Tracheostomy tube placement  10/11/2011    Procedure: TRACHEOSTOMY;  Surgeon: Melida Quitter, MD;  Location: [facility];  Service: ENT;  Laterality: N/A;    There were no vitals taken for this visit.  Visit Diagnosis:  Hemiplegia affecting left nondominant side  Abnormality of gait      Subjective Assessment - 03/26/14 0825    Symptoms Pt. states he is trying to do more at home in regards to walking and exercises; states not really having any problems with mobility   Pertinent History R CVA in August 2013; pt. received OP PT in May 2014 to address mobility and strength deficits   Currently in Pain? No/denies                    [facility] Adult PT Treatment/Exercise - 03/26/14 0001    Transfers   Sit to Stand 5: Supervision;With upper extremity assist;From chair/3-in-1;With armrests   Stand to Sit 5: Supervision;With upper extremity assist;With armrests;To chair/3-in-1   Ambulation/Gait   Ambulation/Gait Yes   Ambulation/Gait Assistance 4: Min guard  CGA   Ambulation Distance (Feet) 120 Feet   Assistive device Rolling walker;Parallel bars   Gait Pattern Step-to pattern;Step-through pattern;Decreased stride  length;Decreased step length - right;Decreased stance time - left   Ambulation Surface Level   Stairs Yes   Stairs Assistance 4: Min guard   Stair Management Technique Two rails;Step to pattern   Number of Stairs 4   Height of Stairs 6   Knee/Hip Exercises: Aerobic   Stationary Bike Nustep level 4 x 5"                  PT Short Term Goals - 02/12/14 1701    PT SHORT TERM GOAL #1   Title Assess gait with L KAFO on LLE with RW or hemiwalker and establish goal as appropriate.   Baseline pt. amb. 53' with RW before requiring seated rest break   Time 1   Period Days   Status Achieved   PT SHORT TERM GOAL #2   Title Pt. will stand 5" with UE support as needed with CGA with assist. device   Baseline 02-13-14   Time 1   Period Days   Status Achieved   PT SHORT TERM GOAL #3   Title Independent in HEP for LLE strengthening.   Baseline 02-13-14   Time 1   Period Days   Status Achieved   PT SHORT TERM GOAL #4   Title Assess TUG with RW   Baseline 1" 37.81 secs with RW tested   Time 1   Status Achieved  PT Long Term Goals - 03/15/14 6301    PT LONG TERM GOAL #7   Title Pt. will report ambulating at home at least 69' with RW modified independently 2x/day.   Baseline 03-29-14   Time 2   Period Weeks   Status New               Plan - 03/26/14 0831    Clinical Impression Statement Pt. progressing well with ambulation - no longer wearing KAFO on LLE and has no LOB with ambulation   Pt will benefit from skilled therapeutic intervention in order to improve on the following deficits Abnormal gait;Decreased coordination;Decreased endurance;Decreased activity tolerance;Decreased balance;Decreased strength;Decreased mobility   Rehab Potential Good   PT Frequency 2x / week   PT Duration 8 weeks   PT Treatment/Interventions Therapeutic activities;Patient/family education;Therapeutic exercise;Gait training;Balance training;Stair training;Neuromuscular  re-education;Functional mobility training   PT Next Visit Plan continue balance and gait   Consulted and Agree with Plan of Care Patient        Problem List Patient Active Problem List   Diagnosis Date Noted  . Genital warts 12/13/2013  . Hyperlipidemia 03/01/2012  . Routine health maintenance 02/03/2012  . Weakness of left side of body 12/14/2011  . Greater trochanteric bursitis 12/14/2011  . ICH (intracerebral hemorrhage) 10/01/2011  . Essential hypertension, benign 10/01/2011    Alda Lea, PT 03/26/2014, 8:35 AM  [facility] [address] [place], [place], [address] Phone: [phone]   Fax:  [phone]

## 2014-03-28 ENCOUNTER — Encounter: Payer: BC Managed Care – PPO | Admitting: Internal Medicine

## 2014-04-01 ENCOUNTER — Ambulatory Visit: Payer: BC Managed Care – PPO | Admitting: Physical Therapy

## 2014-04-04 ENCOUNTER — Ambulatory Visit: Payer: BC Managed Care – PPO | Admitting: Physical Therapy

## 2014-04-04 ENCOUNTER — Encounter: Payer: Self-pay | Admitting: Physical Therapy

## 2014-04-04 DIAGNOSIS — Z5189 Encounter for other specified aftercare: Secondary | ICD-10-CM | POA: Diagnosis not present

## 2014-04-04 DIAGNOSIS — R269 Unspecified abnormalities of gait and mobility: Secondary | ICD-10-CM

## 2014-04-04 DIAGNOSIS — G8194 Hemiplegia, unspecified affecting left nondominant side: Secondary | ICD-10-CM

## 2014-04-04 NOTE — Therapy (Signed)
[facility] [address] [place], [place], [address] Phone: [phone]   Fax:  [phone]  Physical Therapy Treatment  Patient Details  Name: Jeremy [name] MRN: [id number] Date of Birth: Aug 04, 1965 Referring Provider:  Lucious Groves, DO  Encounter Date: 04/04/2014      PT End of Session - 04/04/14 2019    Visit Number 17   Number of Visits 18   Date for PT Re-Evaluation 04/05/14   Authorization Type BCBS no visit limit no auth required   PT Start Time 1535   PT Stop Time 1620   PT Time Calculation (min) 45 min   Equipment Utilized During Treatment Gait belt      Past Medical History  Diagnosis Date  . Hypertension 10/01/2011    basal ganglia  . Stroke   . High cholesterol     Past Surgical History  Procedure Laterality Date  . Tracheostomy tube placement  10/11/2011    Procedure: TRACHEOSTOMY;  Surgeon: Melida Quitter, MD;  Location: [facility];  Service: ENT;  Laterality: N/A;    There were no vitals taken for this visit.  Visit Diagnosis:  Abnormality of gait  Hemiplegia affecting left nondominant side      Subjective Assessment - 04/04/14 2017    Symptoms Pt. states he is doing fine - not having any trouble doing anything at home. Reports walking in home 5 times/day with use of RW   Pertinent History R CVA in August 2013; pt. received OP PT in May 2014 to address mobility and strength deficits   Currently in Pain? No/denies                    [facility] Adult PT Treatment/Exercise - 04/04/14 0001    Transfers   Sit to Stand 5: Supervision;With upper extremity assist;From chair/3-in-1;With armrests   Stand to Sit 5: Supervision;With upper extremity assist;With armrests;To chair/3-in-1   Ambulation/Gait   Ambulation/Gait Yes   Ambulation/Gait Assistance 4: Min guard  CGA   Ambulation Distance (Feet) 120 Feet   Assistive device Rolling walker;Parallel bars   Gait Pattern Step-to  pattern;Step-through pattern;Decreased stride length;Decreased step length - right;Decreased stance time - left   Ambulation Surface Level   Stairs Yes   Knee/Hip Exercises: Aerobic   Stationary Bike Nustep level 4 x 5"     TherAct; floor to stand transfer with CGA and cues for correct positioning; pt. transferred to standing with bil. UE  Support on mat; able to get into 1/2 kneeling position with R leg up modified independently  TherEx: standing left hip flexion, ext., and abduction with green theraband x 10 reps each; tall kneeling - partial squats X 10 reps prior to floor to stand transfer  Gait; inside bars with RUE support only 10' x 2 reps with SBA for balance training          PT Short Term Goals - 02/12/14 1701    PT SHORT TERM GOAL #1   Title Assess gait with L KAFO on LLE with RW or hemiwalker and establish goal as appropriate.   Baseline pt. amb. 40' with RW before requiring seated rest break   Time 1   Period Days   Status Achieved   PT SHORT TERM GOAL #2   Title Pt. will stand 5" with UE support as needed with CGA with assist. device   Baseline 02-13-14   Time 1   Period Days   Status Achieved   PT SHORT  TERM GOAL #3   Title Independent in HEP for LLE strengthening.   Baseline 02-13-14   Time 1   Period Days   Status Achieved   PT SHORT TERM GOAL #4   Title Assess TUG with RW   Baseline 1" 37.81 secs with RW tested   Time 1   Status Achieved           PT Long Term Goals - 03/15/14 4193    PT LONG TERM GOAL #7   Title Pt. will report ambulating at home at least 29' with RW modified independently 2x/day.   Baseline 03-29-14   Time 2   Period Weeks   Status New               Plan - 04/04/14 2020    Clinical Impression Statement Pt. progressing well with mobility - pt. performed floor to stand transfer for 1st time today   Pt will benefit from skilled therapeutic intervention in order to improve on the following deficits Abnormal  gait;Decreased coordination;Decreased endurance;Decreased activity tolerance;Decreased balance;Decreased strength;Decreased mobility   Rehab Potential Good   PT Frequency 2x / week   PT Duration 8 weeks   PT Treatment/Interventions Therapeutic activities;Patient/family education;Therapeutic exercise;Gait training;Balance training;Stair training;Neuromuscular re-education;Functional mobility training   PT Next Visit Plan continue balance and gait; D/C next session   Consulted and Agree with Plan of Care Patient        Problem List Patient Active Problem List   Diagnosis Date Noted  . Genital warts 12/13/2013  . Hyperlipidemia 03/01/2012  . Routine health maintenance 02/03/2012  . Weakness of left side of body 12/14/2011  . Greater trochanteric bursitis 12/14/2011  . ICH (intracerebral hemorrhage) 10/01/2011  . Essential hypertension, benign 10/01/2011    Alda Lea, PT 04/04/2014, 8:23 PM  [facility] [address] [place], [place], [address] Phone: [phone]   Fax:  [phone]

## 2014-04-10 ENCOUNTER — Ambulatory Visit: Payer: BC Managed Care – PPO | Admitting: Physical Medicine & Rehabilitation

## 2014-04-11 ENCOUNTER — Encounter: Payer: BC Managed Care – PPO | Admitting: Internal Medicine

## 2014-04-22 ENCOUNTER — Ambulatory Visit: Payer: BC Managed Care – PPO | Admitting: Physical Therapy

## 2014-04-25 ENCOUNTER — Encounter: Payer: BC Managed Care – PPO | Admitting: Internal Medicine

## 2014-05-06 ENCOUNTER — Ambulatory Visit: Payer: BC Managed Care – PPO | Attending: Physical Medicine & Rehabilitation | Admitting: Physical Therapy

## 2014-05-06 DIAGNOSIS — R278 Other lack of coordination: Secondary | ICD-10-CM | POA: Diagnosis not present

## 2014-05-06 DIAGNOSIS — R269 Unspecified abnormalities of gait and mobility: Secondary | ICD-10-CM | POA: Diagnosis not present

## 2014-05-06 DIAGNOSIS — Z5189 Encounter for other specified aftercare: Secondary | ICD-10-CM | POA: Diagnosis not present

## 2014-05-06 DIAGNOSIS — G8194 Hemiplegia, unspecified affecting left nondominant side: Secondary | ICD-10-CM

## 2014-05-06 DIAGNOSIS — I6930 Unspecified sequelae of cerebral infarction: Secondary | ICD-10-CM | POA: Diagnosis not present

## 2014-05-07 ENCOUNTER — Encounter: Payer: Self-pay | Admitting: Physical Therapy

## 2014-05-07 NOTE — Therapy (Signed)
[facility] [address] [place], [place], [address] Phone: [phone]   Fax:  [phone]  Physical Therapy Treatment  Patient Details  Name: Jeremy [name] MRN: [id number] Date of Birth: May 29, 1965 Referring Provider:  Lucious Groves, DO  Encounter Date: 05/06/2014      PT End of Session - 05/07/14 2116    Visit Number 18   Number of Visits 18   Authorization Type BCBS no visit limit no auth required   PT Start Time 0935   PT Stop Time 1020   PT Time Calculation (min) 45 min      Past Medical History  Diagnosis Date  . Hypertension 10/01/2011    basal ganglia  . Stroke   . High cholesterol     Past Surgical History  Procedure Laterality Date  . Tracheostomy tube placement  10/11/2011    Procedure: TRACHEOSTOMY;  Surgeon: Melida Quitter, MD;  Location: [facility];  Service: ENT;  Laterality: N/A;    There were no vitals filed for this visit.  Visit Diagnosis:  Abnormality of gait  Hemiplegia affecting left nondominant side      Subjective Assessment - 05/07/14 2111    Symptoms Pt. reports he is walking alot at home - doing much better   Pertinent History R CVA in August 2013; pt. received OP PT in May 2014 to address mobility and strength deficits   Currently in Pain? No/denies                       [facility] Adult PT Treatment/Exercise - 05/07/14 0001    Ambulation/Gait   Assistive device Rolling walker   Gait Pattern Step-to pattern   Stairs Yes   Stairs Assistance 5: Supervision   Stair Management Technique Two rails;Step to pattern   Number of Stairs 4   Height of Stairs 6   Ambulation/Gait Yes   Ambulation/Gait Assistance 5: Supervision   Ambulation Distance (Feet) 120 Feet   Timed Up and Go Test   TUG Normal TUG   Normal TUG (seconds) 60.91                  PT Short Term Goals - 02/12/14 1701    PT SHORT TERM GOAL #1   Title Assess gait with L KAFO on LLE with RW  or hemiwalker and establish goal as appropriate.   Baseline pt. amb. 61' with RW before requiring seated rest break   Time 1   Period Days   Status Achieved   PT SHORT TERM GOAL #2   Title Pt. will stand 5" with UE support as needed with CGA with assist. device   Baseline 02-13-14   Time 1   Period Days   Status Achieved   PT SHORT TERM GOAL #3   Title Independent in HEP for LLE strengthening.   Baseline 02-13-14   Time 1   Period Days   Status Achieved   PT SHORT TERM GOAL #4   Title Assess TUG with RW   Baseline 1" 37.81 secs with RW tested   Time 1   Status Achieved           PT Long Term Goals - 05/07/14 2117    PT LONG TERM GOAL #1   Title Amb. 60' with KAFO on LLE with min. assist with RW for incr. household accessibility.   Status Achieved   PT LONG TERM GOAL #2   Title Improve TUG by 4  secs to demo incr. functional mobility (with use of RW)   Status Achieved   PT LONG TERM GOAL #3   Title Don and doff KAFO with min. assist.   Status Deferred   PT LONG TERM GOAL #4   Title Independent in updated HEP for LLE strengthening exercises.   PT LONG TERM GOAL #6   Title Pt. will report ambulating into bathroom at home with RW and report ability to perform tub transfer pending obtaining tub bench for tub.   Baseline Pt. reports amb. into bathroom but tub bench not yet obtained   Status Achieved               Problem List Patient Active Problem List   Diagnosis Date Noted  . Genital warts 12/13/2013  . Hyperlipidemia 03/01/2012  . Routine health maintenance 02/03/2012  . Weakness of left side of body 12/14/2011  . Greater trochanteric bursitis 12/14/2011  . ICH (intracerebral hemorrhage) 10/01/2011  . Essential hypertension, benign 10/01/2011    Alda Lea, PT 05/07/2014, 9:26 PM  [facility] [address] [place], [place], [address] Phone: [phone]   Fax:   [phone]

## 2014-05-14 ENCOUNTER — Encounter: Payer: Self-pay | Admitting: Physical Medicine & Rehabilitation

## 2014-05-14 ENCOUNTER — Encounter
Payer: BC Managed Care – PPO | Attending: Physical Medicine & Rehabilitation | Admitting: Physical Medicine & Rehabilitation

## 2014-05-14 VITALS — BP 124/80 | HR 76

## 2014-05-14 DIAGNOSIS — I618 Other nontraumatic intracerebral hemorrhage: Secondary | ICD-10-CM | POA: Diagnosis not present

## 2014-05-14 DIAGNOSIS — I10 Essential (primary) hypertension: Secondary | ICD-10-CM | POA: Diagnosis not present

## 2014-05-14 DIAGNOSIS — M6289 Other specified disorders of muscle: Secondary | ICD-10-CM | POA: Diagnosis not present

## 2014-05-14 DIAGNOSIS — R531 Weakness: Secondary | ICD-10-CM

## 2014-05-14 MED ORDER — TRAMADOL HCL 50 MG PO TABS: 50.0000 mg | ORAL_TABLET | Freq: Three times a day (TID) | ORAL | Status: DC | PRN

## 2014-05-14 NOTE — Progress Notes (Signed)
Subjective:    Patient ID: Jeremy [name], male    DOB: 1965-12-17, 49 y.o.   MRN: [id number]  HPI   Jeremy [name] is back regarding his ICH and associated functional deficits. I last saw him in October of last year.  He has been involved with outpatient therapy. He is walking around the house using a rolling walker without assistance.   His BP has been under better control. His wife states it still can wax and wane a bit.   His pain has been under better control. It still is of a burning quality. His spasms come and go. He came off the baclofen without issues. He uses a tramadol perhaps twice per week.        Pain Inventory Average Pain 3 Pain Right Now 3 My pain is intermittent, burning and aching  In the last 24 hours, has pain interfered with the following? General activity 0 Relation with others 0 Enjoyment of life 0 What TIME of day is your pain at its worst? evening Sleep (in general) Fair  Pain is worse with: sitting and inactivity Pain improves with: rest Relief from Meds: 3  Mobility ability to climb steps?  no do you drive?  no use a wheelchair  Function disabled: date disabled .  Neuro/Psych No problems in this area  Prior Studies Any changes since last visit?  no  Physicians involved in your care Any changes since last visit?  no   Family History  Problem Relation Age of Onset  . Hypertension Mother   . Heart disease Father   . Gout Father   . Hypertension Father    History   Social History  . Marital Status: Married    Spouse Name: N/A  . Number of Children: 0  . Years of Education: 12th   Occupational History  . retired [employer]   Social History Main Topics  . Smoking status: Never Smoker   . Smokeless tobacco: Not on file  . Alcohol Use: No  . Drug Use: No  . Sexual Activity: Not on file   Other Topics Concern  . None   Social History Narrative   Patient lives with his wife..and drinks sodas.   Past  Surgical History  Procedure Laterality Date  . Tracheostomy tube placement  10/11/2011    Procedure: TRACHEOSTOMY;  Surgeon: Melida Quitter, MD;  Location: [facility];  Service: ENT;  Laterality: N/A;   Past Medical History  Diagnosis Date  . Hypertension 10/01/2011    basal ganglia  . Stroke   . High cholesterol    BP 124/80 mmHg  Pulse 76  SpO2 97%  Opioid Risk Score:   Fall Risk Score: Moderate Fall Risk (6-13 points)`1  Depression screen PHQ 2/9  Depression screen [facility] 2/9 05/14/2014 12/13/2013 08/09/2013 05/07/2013 04/17/2013 10/19/2012 08/01/2012  Decreased Interest 3 0 0 0 0 0 0  Down, Depressed, Hopeless 0 0 0 0 0 0 -  PHQ - 2 Score 3 0 0 0 0 0 0  Altered sleeping 1 - - - - - -  Tired, decreased energy 0 - - - - - -  Change in appetite 0 - - - - - -  Feeling bad or failure about yourself  0 - - - - - -  Trouble concentrating 0 - - - - - -  Moving slowly or fidgety/restless 0 - - - - - -  Suicidal thoughts 0 - - - - - -  PHQ-9 Score  4 - - - - - -     Review of Systems  Constitutional: Negative.   HENT: Negative.   Eyes: Negative.   Respiratory: Negative.   Cardiovascular: Negative.   Gastrointestinal: Negative.   Endocrine: Negative.   Genitourinary: Negative.   Musculoskeletal: Positive for myalgias and arthralgias.       Left hip pain  Skin: Negative.   Allergic/Immunologic: Negative.   Neurological: Negative.   Hematological: Negative.   Psychiatric/Behavioral: Negative.        Objective:   Physical Exam  General: Alert and oriented x 3, No apparent distress, overweight  HEENT: Head is normocephalic, atraumatic, PERRLA, EOMI, exopthalmos, sclera anicteric, oral mucosa pink and moist, dentition poor, ext ear canals clear,  Neck: Supple without JVD or lymphadenopathy  Heart: Reg rate and rhythm. No murmurs rubs or gallops  Chest: CTA bilaterally without wheezes, rales, or rhonchi; no distress  Abdomen: Soft, non-tender, non-distended, bowel sounds positive. .   Extremities: No clubbing, cyanosis, or edema. Pulses are 2+  Skin: Clean and intact without signs of breakdown  Neuro: Pt is cognitively appropriate with normal insight, memory, and awareness.speech volume improved, minimal dysarthria. Cranial nerves 2-12 are notable for better tracking and conjugate gaze, mild left facial weakness still... Sensory exam is 1/2 on the left at the thumb/hand. Reflexes are 3+ on the left. No resting tone. .. 5/5 motor on the right. LUE is 4/5 prox to distal. LLE is 4-/5. left PD. Able to stand unassisted. Tends to lean to the right Musculoskeletal: Full ROM, . no pain with ER, IR, and flexion of the the left hip.  Psych: Pt's affect is appropriate. Pt is cooperative. Affect more dynamic  Assessment & Plan:   ASSESSMENT: 1. Right BG/thalamic hemorrhage- he is making gradual progress  2. HTN  3. Dysphagia (resolved) .  4. Spastic left hemiparesis.  5. Mild left greater trochanter bursitis--still has an occasional flare.   Plan:  1. Ongoing therapy per neuro-rehab and at home.  2. Dental mgt still needed for caries 3. Tramadol prn for pain.  urged him to use tylenol, ice, and heat---would like to see further taper. rx'ed #90 with 1 RF 5. Blood pressure control with lopressor, HCTZ --improved   6. Maintain HEP as possible at home. .  7. Follow up here in about 4 months. Thirty minutes of face to face patient care time were spent during this visit. All questions were encouraged and answered.

## 2014-05-14 NOTE — Patient Instructions (Signed)
PLEASE CALL ME WITH ANY PROBLEMS OR QUESTIONS (#[phone]).      

## 2014-05-15 ENCOUNTER — Encounter: Payer: BC Managed Care – PPO | Admitting: Physical Medicine & Rehabilitation

## 2014-05-22 ENCOUNTER — Telehealth: Payer: Self-pay | Admitting: Internal Medicine

## 2014-05-22 NOTE — Telephone Encounter (Signed)
Calling patient to confirm appointment for 05/23/14 at 2:45 lmtcb

## 2014-05-23 ENCOUNTER — Encounter: Payer: BC Managed Care – PPO | Admitting: Internal Medicine

## 2014-05-23 ENCOUNTER — Encounter: Payer: Self-pay | Admitting: Internal Medicine

## 2014-05-23 ENCOUNTER — Ambulatory Visit (INDEPENDENT_AMBULATORY_CARE_PROVIDER_SITE_OTHER): Payer: BC Managed Care – PPO | Admitting: Internal Medicine

## 2014-05-23 VITALS — BP 113/70 | HR 73 | Temp 98.8°F | Wt 246.5 lb

## 2014-05-23 DIAGNOSIS — R531 Weakness: Secondary | ICD-10-CM

## 2014-05-23 DIAGNOSIS — M6289 Other specified disorders of muscle: Secondary | ICD-10-CM | POA: Diagnosis not present

## 2014-05-23 DIAGNOSIS — E785 Hyperlipidemia, unspecified: Secondary | ICD-10-CM | POA: Diagnosis not present

## 2014-05-23 DIAGNOSIS — I10 Essential (primary) hypertension: Secondary | ICD-10-CM

## 2014-05-23 LAB — BASIC METABOLIC PANEL WITH GFR
BUN: 10 mg/dL (ref 6–23)
CO2: 29 mEq/L (ref 19–32)
Calcium: 9.1 mg/dL (ref 8.4–10.5)
Chloride: 101 mEq/L (ref 96–112)
Creat: 0.92 mg/dL (ref 0.50–1.35)
GFR, Est African American: >89 mL/min
GFR, Est Non African American: >89 mL/min
Glucose, Bld: 78 mg/dL (ref 70–99)
Potassium: 3.6 mEq/L (ref 3.5–5.3)
Sodium: 137 mEq/L (ref 135–145)

## 2014-05-23 LAB — LIPID PANEL
Cholesterol: 127 mg/dL (ref 0–200)
HDL: 27 mg/dL — ABNORMAL LOW (ref >=40)
LDL Cholesterol: 76 mg/dL (ref 0–99)
Total CHOL/HDL Ratio: 4.7 Ratio
Triglycerides: 118 mg/dL (ref <150)
VLDL: 24 mg/dL (ref 0–40)

## 2014-05-23 NOTE — Patient Instructions (Signed)
General Instructions: Continue your current blood pressure medications we will see you in 6-12 months.  Thank you for bringing your medicines today. This helps [place] keep you safe from mistakes.   Progress Toward Treatment Goals:  Treatment Goal 12/13/2013  Blood pressure deteriorated    Self Care Goals & Plans:  Self Care Goal 05/23/2014  Manage my medications bring my medications to every visit; refill my medications on time; take my medicines as prescribed  Monitor my health keep track of my blood pressure  Eat healthy foods eat more vegetables; eat foods that are low in salt; eat baked foods instead of fried foods  Be physically active take a walk every day  Meeting treatment goals -    No flowsheet data found.   Care Management & Community Referrals:  Referral 12/13/2013  Referrals made for care management support none needed  Referrals made to community resources -

## 2014-05-23 NOTE — Progress Notes (Signed)
[facility] Subjective:   Patient ID: Jeremy [name] male   DOB: [birth year] 49 y.o.   MRN: [id number]  HPI: Jeremy [name] is a 49 y.o. male with a PMH detailed below who presents for regular follow up.  For his spastic hemiplegia of left side after stroke he is doing well his muscle strength continues to improved slowly.  He notes that he is now able to do more and get his muscles fatigued on his left side which he feels is a good thing.  He is now occasionally moving about the house with a walker.  He has rarely needed any tramadol.  HTN: His wife has been diligently checking his blood pressure and by her log it has been very well controlled at home with HCTZ and metoprolol.  HLD: Taking atorvastatin without issue.  Poor dentition: has seen a dentist and has follow up plans for removal and denture fittings.    Past Medical History  Diagnosis Date  . Hypertension 10/01/2011    basal ganglia  . Stroke   . High cholesterol    Current Outpatient Prescriptions  Medication Sig Dispense Refill  . atorvastatin (LIPITOR) 40 MG tablet Take 1 tablet (40 mg total) by mouth daily at 6 PM. 90 tablet 3  . hydrochlorothiazide (HYDRODIURIL) 25 MG tablet Take 1 tablet (25 mg total) by mouth daily. 90 tablet 3  . metoprolol (LOPRESSOR) 50 MG tablet Take 1 tablet (50 mg total) by mouth 2 (two) times daily. 180 tablet 0  . traMADol (ULTRAM) 50 MG tablet Take 1 tablet (50 mg total) by mouth every 8 (eight) hours as needed. 90 tablet 1  . prochlorperazine (COMPAZINE) 10 MG tablet Take 1 tablet (10 mg total) by mouth every 6 (six) hours as needed for nausea. (Patient not taking: Reported on 05/23/2014) 30 tablet 5   No current facility-administered medications for this visit.   Family History  Problem Relation Age of Onset  . Hypertension Mother   . Heart disease Father   . Gout Father   . Hypertension Father    History   Social History  . Marital Status: Married   Spouse Name: N/A  . Number of Children: 0  . Years of Education: 12th   Occupational History  . retired [employer]   Social History Main Topics  . Smoking status: Never Smoker   . Smokeless tobacco: Not on file  . Alcohol Use: No  . Drug Use: No  . Sexual Activity: Not on file   Other Topics Concern  . None   Social History Narrative   Patient lives with his wife..and drinks sodas.   Review of Systems: Review of Systems  Constitutional: Negative for fever, chills and weight loss.  Respiratory: Positive for cough (occasional feels it may be due to change in seasons.). Negative for shortness of breath.   Cardiovascular: Negative for chest pain.  Gastrointestinal: Negative for heartburn and abdominal pain.  Genitourinary: Negative for dysuria.  Musculoskeletal: Negative for myalgias.  Neurological: Negative for dizziness and headaches.  Psychiatric/Behavioral: The patient has insomnia (mild, mostly able to sleep through night.).      Objective:  Physical Exam: Filed Vitals:   05/23/14 1509  BP: 113/70  Pulse: 73  Temp: 98.8 F (37.1 C)  TempSrc: Oral  Weight: 246 lb 8 oz (111.812 kg)  SpO2: 97%  Physical Exam  Constitutional: He is well-developed, well-nourished, and in no distress.  In [place]  HENT:  Poor dentetion  Eyes:  Conjunctivae are normal.  Cardiovascular: Normal rate, regular rhythm and normal heart sounds.   No murmur heard. Pulmonary/Chest: Effort normal and breath sounds normal.  Abdominal: Soft. Bowel sounds are normal. He exhibits no distension. There is no tenderness.  Musculoskeletal: He exhibits no edema.  4/5 major muscle strength in left lower extremity and left upper extremity, unstable when trying to stand out of wheelchair.  Sensation intact on bilateral upper and lower extremitites.  5/5 gross muscle strength of right upper and lower extremities.  Skin: Skin is warm and dry.  Psychiatric: Affect normal.  Nursing note and vitals  reviewed.   Assessment & Plan:  Case discussed with Dr. Daryll Drown  Essential hypertension, benign BP Readings from Last 3 Encounters:  05/23/14 113/70  05/14/14 124/80  01/14/14 142/87    Lab Results  Component Value Date   NA 137 05/23/2014   K 3.6 05/23/2014   CREATININE 0.92 05/23/2014    Assessment: Blood pressure control: controlled Progress toward BP goal:  at goal Comments:   Plan: Medications:  HCTZ 25mg  daily, Metoprolol 50mg  BID Educational resources provided:   Self management tools provided:   Other plans: BP well controlled, patient only need to check ambulatory pressure as needed maybe 1-2 a week.  Updated bloodwork with BMP.     Weakness of left side of body - Continues to improve gradually, I encouraged him to continue to test his strength and move around as much as possible.   Hyperlipidemia - Repeat Lipid panel - LDL at goal continue atorvastatin.     Medications Ordered No orders of the defined types were placed in this encounter.   Other Orders Orders Placed This Encounter  Procedures  . BMP with Estimated GFR ([id number])  . Lipid Profile

## 2014-05-24 ENCOUNTER — Other Ambulatory Visit: Payer: Self-pay | Admitting: *Deleted

## 2014-05-24 MED ORDER — METOPROLOL TARTRATE 50 MG PO TABS: 50.0000 mg | ORAL_TABLET | Freq: Two times a day (BID) | ORAL | Status: DC

## 2014-05-24 MED ORDER — HYDROCHLOROTHIAZIDE 25 MG PO TABS: 25.0000 mg | ORAL_TABLET | Freq: Every day | ORAL | Status: DC

## 2014-05-24 NOTE — Assessment & Plan Note (Signed)
-   Continues to improve gradually, I encouraged him to continue to test his strength and move around as much as possible.

## 2014-05-24 NOTE — Assessment & Plan Note (Signed)
-   Repeat Lipid panel - LDL at goal continue atorvastatin.

## 2014-05-24 NOTE — Assessment & Plan Note (Addendum)
BP Readings from Last 3 Encounters:  05/23/14 113/70  05/14/14 124/80  01/14/14 142/87    Lab Results  Component Value Date   NA 137 05/23/2014   K 3.6 05/23/2014   CREATININE 0.92 05/23/2014    Assessment: Blood pressure control: controlled Progress toward BP goal:  at goal Comments:   Plan: Medications:  HCTZ 25mg  daily, Metoprolol 50mg  BID Educational resources provided:   Self management tools provided:   Other plans: BP well controlled, patient only need to check ambulatory pressure as needed maybe 1-2 a week.  Updated bloodwork with BMP.

## 2014-05-27 ENCOUNTER — Telehealth: Payer: Self-pay | Admitting: Licensed Clinical Social Worker

## 2014-05-27 NOTE — Addendum Note (Signed)
Addended by: Gilles Chiquito B on: 05/27/2014 01:48 PM   Modules accepted: Level of Service

## 2014-05-27 NOTE — Telephone Encounter (Signed)
[facility] office referred Mr. Sidle to CSW as spouse mentioned having financial difficulty and being the primary caregiver to patient.  CSW placed called to pt.  CSW left message requesting return call. CSW provided contact hours and phone number.  CSW will provide information re: [employer] and [employer].

## 2014-05-27 NOTE — Progress Notes (Signed)
Internal Medicine Clinic Attending  Case discussed with Dr. Hoffman soon after the resident saw the patient.  We reviewed the resident's history and exam and pertinent patient test results.  I agree with the assessment, diagnosis, and plan of care documented in the resident's note. 

## 2014-05-28 NOTE — Telephone Encounter (Signed)
Pt's spouse returned call to [place].  Spouse states pt's biggest barrier at this time is prior medical expenses.  However, spouse states she is now on a payment plan.  Jeremy [name] currently has a private policy, in addition to Medicare Part A.  Pt will have Medicare Part B within the next month.  CSW discussed once pt has Medicare A&B, pt can look a policies that have the best coverage.  In addition, spouse is aware once pt no longer has the private policy; they can explore Medicaid eligibility once again.  CSW encouraged spouse/pt to maintain all medical expenses and provide to DSS once Jeremy [name] no longer has the private policy.   Pt is active with [facility] and denies need for assistance with ADL's.  CSW discussed the [facility] program and current wait list.  Pt would benefit from referral to [facility], as spouse states pt has difficulty with washing one side of body.  CSW will send information to patient on [facility], Medicare Replacement Policies and potential for [facility] transportation (however cost may be the same as [employer]).

## 2014-06-12 ENCOUNTER — Ambulatory Visit (INDEPENDENT_AMBULATORY_CARE_PROVIDER_SITE_OTHER): Payer: BC Managed Care – PPO | Admitting: Neurology

## 2014-06-12 ENCOUNTER — Encounter: Payer: Self-pay | Admitting: Neurology

## 2014-06-12 VITALS — BP 111/78 | HR 67 | Wt 246.0 lb

## 2014-06-12 DIAGNOSIS — E784 Other hyperlipidemia: Secondary | ICD-10-CM

## 2014-06-12 DIAGNOSIS — H9192 Unspecified hearing loss, left ear: Secondary | ICD-10-CM

## 2014-06-12 DIAGNOSIS — E7849 Other hyperlipidemia: Secondary | ICD-10-CM

## 2014-06-12 MED ORDER — ASPIRIN EC 81 MG PO TBEC: 81.0000 mg | DELAYED_RELEASE_TABLET | Freq: Every day | ORAL | Status: DC

## 2014-06-12 NOTE — Progress Notes (Signed)
PATIENT: Jeremy [name] DOB: May 12, 1965  REASON FOR VISIT: follow up HISTORY FROM: patient  HISTORY OF PRESENT ILLNESS: Jeremy [name] is a 49 year old male with history of hypertension not treated times years. He was admitted on October 01, 2011, with headache nausea, vomiting, and difficulty using left hand. CT of head done revealed right basal ganglia hemorrhage with surrounding edema. He was started on nicardipine drip for blood pressure control. He did develop somnolence due to extension of hemorrhage to right thalamus and posterior third ventricle with concerns for developing obstructive hydrocephalus. Intraventricular cath was placed by Dr. Annette Stable. He did require replacement of catheter due to malfunction on August 21,2013. He had difficulty weaning of the vent and was trached on October 11, 2011, and gastrostomy tube was placed by Interventional Radiology on October 13, 2011. The patient has had an episode of projectile vomiting. On October 20, 2011, KUB done showed no evidence of obstruction. Rate was adjusted with resolution of symptoms. His trach has been downsized to cuffless Shiley 6 done and regular diet initiated. The patient continued to be impaired due to dense left hemiparesis and sensory deficits, left neglect aphonia with cognitive deficits, as well as visual deficits in upper fields.   UPDATE 09/14/12 (LL): Patient comes to office for first time since [place] almost 1 year ago. He is accompanied by his spouse. He has made slow gradual progress. Able to eat regular diet, still has dense left hemiparesis and sensory deficits, left neglect, with cognitive deficits. He has completed all of the therapy sessions that insurance would allow. He is not able to walk, despite having AFO brace for LLE and decent strength. Has been taking medications as prescribed, with close monitoring from spouse. BP is under good control.   UPDATE 05/11/13 (LL): Patient comes in for [facility] follow up, last visit 6  months ago.  He has regained some more strength in right upper and lower extremities but still unable to bear weight on left leg.  He states he has regained more feeling on his left hand and left lateral torso since last visit.  His wife states he has some short term memory problems, but she helps him with his medications.  He has no complaints today.  States he has only very occasional left hip pain.  BP in office today is 141/84.  His recent lipid panel showed total cholesterol of 236 and LDL of 169; he was started on Lipitor 40 mg. Update 06/12/2014 : he returns for follow-up after last visit 1 year ago. Is accompanied by his wife. Patient is now able to walk with a walker at home and uses a wheelchair only for long distances. He has opted improvement in his left-sided strength but still has diminished fine motor skills in the left hand and some pain and spasticity. He is fairly independent and can do most of his activities by himself. His blood pressure is been running usually in the 110/80 range and occasionally can go higher. He saw his family physician last week. He has a new complaint of decreased hearing in the left ear but denies any pain. He has not yet had this evaluated. He is tolerating Lipitor well but has not had any follow-up lipid profile checked that I can  see REVIEW OF SYSTEMS: Full 14 system review of systems performed and notable only for: gait and balance difficulty, left hand weakness and clumsiness only and all other systems negative   ALLERGIES: Allergies  Allergen Reactions  . Amlodipine  Dermatitis    Patient and wife report skin rash in groin while on medication, once stopped the rash resolved.  . Lisinopril Itching    HOME MEDICATIONS: Outpatient Prescriptions Prior to Visit  Medication Sig Dispense Refill  . atorvastatin (LIPITOR) 40 MG tablet Take 1 tablet (40 mg total) by mouth daily at 6 PM. 90 tablet 3  . hydrochlorothiazide (HYDRODIURIL) 25 MG tablet Take 1 tablet  (25 mg total) by mouth daily. 90 tablet 3  . metoprolol (LOPRESSOR) 50 MG tablet Take 1 tablet (50 mg total) by mouth 2 (two) times daily. 180 tablet 3  . prochlorperazine (COMPAZINE) 10 MG tablet Take 1 tablet (10 mg total) by mouth every 6 (six) hours as needed for nausea. (Patient not taking: Reported on 05/23/2014) 30 tablet 5  . traMADol (ULTRAM) 50 MG tablet Take 1 tablet (50 mg total) by mouth every 8 (eight) hours as needed. 90 tablet 1   No facility-administered medications prior to visit.   PHYSICAL EXAM  Filed Vitals:   06/12/14 0842  BP: 111/78  Pulse: 67  Weight: 246 lb (111.585 kg)   Body mass index is 35.3 kg/(m^2).  Generalized: In no acute distress, obese AA male in wheelchair  Neck: Supple, no carotid bruits  Cardiac: Regular rate rhythm, no murmur  Pulmonary: Clear to auscultation bilaterally  Musculoskeletal: No deformity   Neurological examination  Mentation: Alert oriented to time, place, history taking, language fluent, soft voice diminished attention registration and recall. Mini-Mental status exam not done Cranial nerve II-XII: Pupils were equal round reactive to light extraocular movements were full, visual field were full on confrontational test. facial sensation decreased on left, strength normal. hearing was intact to finger rubbing bilaterally. Uvula tongue midline. head turning and shoulder shrug and were normal and symmetric.Tongue protrusion into cheek strength was normal.  MOTOR: 5/5 motor on the right. LUE is  4/5. LLE is  4/5.with mild weakness of left grip and ankle dorsiflexors. Increased tone on the left with mild spasticity.  SENSORY: Sensory exam shows diminished sensation on the left. Normal on the right.  COORDINATION: FTF slow on left, but able. Normal on right.  REFLEXES: Reflexes are 3+ on the left. 2+ right  GAIT/STATION: unable. Sitting in a wheelchair   ASSESSMENT AND PLAN 49 y.o. year old male has a past medical history of Hypertension  and High cholesterol here for follow up for  Right basal ganglia ICH on (10/01/2011). Has made slow, gradual recovery.  .    I had a long d/w patient about his remote stroke, risk for recurrent stroke/TIAs, personally independently reviewed imaging studies and stroke evaluation results and answered questions.Start aspirin 81 mg daily  for secondary stroke prevention and maintain strict control of hypertension with blood pressure goal below 130/90, diabetes with hemoglobin A1c goal below 6.5% and lipids with LDL cholesterol goal below 100 mg/dL. I also advised the patient to eat a healthy diet with plenty of whole grains, cereals, fruits and vegetables, exercise regularly and maintain ideal body weight. He has new complaint of left ear hearing loss I advised him to see his primary MD for evaluation for that. Patient requested a letter of disability for being unable to work in his prior fulltime job. Followup in the future with me in  1 year or call earlier if necessary.   Antony Contras, MD  06/12/2014, 10:08 AM [facility] [address], [place], [place] [address] [phone]  Note: This document was prepared with digital  dictation and possible smart phrase technology. Any transcriptional errors that result from this process are unintentional.

## 2014-06-12 NOTE — Patient Instructions (Signed)
I had a long d/w patient about his remote stroke, risk for recurrent stroke/TIAs, personally independently reviewed imaging studies and stroke evaluation results and answered questions.Start aspirin 81 mg daily  for secondary stroke prevention and maintain strict control of hypertension with blood pressure goal below 130/90, diabetes with hemoglobin A1c goal below 6.5% and lipids with LDL cholesterol goal below 100 mg/dL. I also advised the patient to eat a healthy diet with plenty of whole grains, cereals, fruits and vegetables, exercise regularly and maintain ideal body weight. He has new complaint of left ear hearing loss I advised him to see his primary Md for evaluation for that. Patient requested a letter of disability for being unable to work in his prior fulltime job. Followup in the future with me in  1 year or call earlier if necessary.

## 2014-08-21 ENCOUNTER — Telehealth: Payer: Self-pay | Admitting: *Deleted

## 2014-08-21 NOTE — Telephone Encounter (Signed)
Pt calls and would like something for muscle cramps, states he has muscle cramps of the leg, left side for appr 2 months, disturbs his sleep, most nights this happens, sometimes during the day but not often. Please advise

## 2014-08-22 NOTE — Telephone Encounter (Signed)
Tried to call patient no answer will try again

## 2014-08-23 NOTE — Telephone Encounter (Signed)
Got ahold of Mr Daleo, advised stretching exercises and starting a daily multivitamin.  He will call back if worse otherwise we will discuss further at next appointment.  Lucious Groves, DO

## 2014-09-10 ENCOUNTER — Encounter: Payer: Medicare Other | Attending: Physical Medicine & Rehabilitation | Admitting: Physical Medicine & Rehabilitation

## 2014-09-10 ENCOUNTER — Encounter: Payer: Self-pay | Admitting: Physical Medicine & Rehabilitation

## 2014-09-10 VITALS — BP 118/70 | HR 67 | Resp 16

## 2014-09-10 DIAGNOSIS — I10 Essential (primary) hypertension: Secondary | ICD-10-CM | POA: Insufficient documentation

## 2014-09-10 DIAGNOSIS — G8114 Spastic hemiplegia affecting left nondominant side: Secondary | ICD-10-CM

## 2014-09-10 DIAGNOSIS — G8110 Spastic hemiplegia affecting unspecified side: Secondary | ICD-10-CM | POA: Diagnosis not present

## 2014-09-10 DIAGNOSIS — E780 Pure hypercholesterolemia: Secondary | ICD-10-CM | POA: Insufficient documentation

## 2014-09-10 DIAGNOSIS — R1310 Dysphagia, unspecified: Secondary | ICD-10-CM | POA: Diagnosis not present

## 2014-09-10 DIAGNOSIS — I69254 Hemiplegia and hemiparesis following other nontraumatic intracranial hemorrhage affecting left non-dominant side: Secondary | ICD-10-CM | POA: Diagnosis not present

## 2014-09-10 DIAGNOSIS — R252 Cramp and spasm: Secondary | ICD-10-CM | POA: Diagnosis present

## 2014-09-10 DIAGNOSIS — I610 Nontraumatic intracerebral hemorrhage in hemisphere, subcortical: Secondary | ICD-10-CM | POA: Diagnosis not present

## 2014-09-10 MED ORDER — BACLOFEN 10 MG PO TABS: 10.0000 mg | ORAL_TABLET | Freq: Every day | ORAL | Status: DC

## 2014-09-10 NOTE — Progress Notes (Signed)
Subjective:    Patient ID: Jeremy [name], male    DOB: April 11, 1965, 49 y.o.   MRN: [id number]  HPI  Jeremy [name] is having problems with sleeping at night because of cramps in his legs. They have been persistent. He usually just waits them out. He is not taking any muscle relaxants currently.   He remains diligent with his daily stretches and exercises. He stands and walks almost every day. He uses his powered wheelchair as his main means of mobility.  He is using an occasional tramadol for breakthrough pain. He states that his blood pressure has been better.   Pain Inventory Average Pain 0 Pain Right Now 0 My pain is spasms at night  In the last 24 hours, has pain interfered with the following? General activity 0 Relation with others 0 Enjoyment of life 0 What TIME of day is your pain at its worst? night Sleep (in general) Good  Pain is worse with: walking and sleeping Pain improves with: nothing Relief from Meds: 0  Mobility walk with assistance use a walker use a wheelchair transfers alone  Function I need assistance with the following:  meal prep, household duties and shopping  Neuro/Psych tingling spasms  Prior Studies Any changes since last visit?  no  Physicians involved in your care Any changes since last visit?  no   Family History  Problem Relation Age of Onset  . Hypertension Mother   . Heart disease Father   . Gout Father   . Hypertension Father    History   Social History  . Marital Status: Married    Spouse Name: N/A  . Number of Children: 0  . Years of Education: 12th   Occupational History  . retired [employer]   Social History Main Topics  . Smoking status: Never Smoker   . Smokeless tobacco: Not on file  . Alcohol Use: No  . Drug Use: No  . Sexual Activity: Not on file   Other Topics Concern  . None   Social History Narrative   Patient lives with his wife..and drinks sodas.   Past Surgical History  Procedure  Laterality Date  . Tracheostomy tube placement  10/11/2011    Procedure: TRACHEOSTOMY;  Surgeon: Melida Quitter, MD;  Location: [facility];  Service: ENT;  Laterality: N/A;   Past Medical History  Diagnosis Date  . Hypertension 10/01/2011    basal ganglia  . Stroke   . High cholesterol    BP 118/70 mmHg  Pulse 67  Resp 16  SpO2 95%  Opioid Risk Score:   Fall Risk Score:  `1  Depression screen PHQ 2/9  Depression screen [facility] 2/9 09/10/2014 05/23/2014 05/14/2014 12/13/2013 08/09/2013 05/07/2013 04/17/2013  Decreased Interest 3 0 3 0 0 0 0  Down, Depressed, Hopeless 0 0 0 0 0 0 0  PHQ - 2 Score 3 0 3 0 0 0 0  Altered sleeping 1 - 1 - - - -  Tired, decreased energy 0 - 0 - - - -  Change in appetite 0 - 0 - - - -  Feeling bad or failure about yourself  0 - 0 - - - -  Trouble concentrating 0 - 0 - - - -  Moving slowly or fidgety/restless 0 - 0 - - - -  Suicidal thoughts 0 - 0 - - - -  PHQ-9 Score 4 - 4 - - - -     Review of Systems  Musculoskeletal:  Spasms  Neurological:       Tingling  All other systems reviewed and are negative.      Objective:   Physical Exam  General: Alert and oriented x 3, No apparent distress, overweight  HEENT: Head is normocephalic, atraumatic, PERRLA, EOMI, exopthalmos, sclera anicteric, oral mucosa pink and moist, dentition poor, ext ear canals clear,  Neck: Supple without JVD or lymphadenopathy  Heart: Reg rate and rhythm. No murmurs rubs or gallops  Chest: CTA bilaterally without wheezes, rales, or rhonchi; no distress  Abdomen: Soft, non-tender, non-distended, bowel sounds positive. .  Extremities: No clubbing, cyanosis, or edema. Pulses are 2+  Skin: Clean and intact without signs of breakdown  Neuro: Pt is cognitively appropriate with normal insight, memory, and awareness.speech volume improved, minimal dysarthria. Cranial nerve: improved tracking and conjugate gaze, mild left facial weakness still... Sensory exam is 1/2 on the left at the  thumb/hand. Reflexes are 3+ on the left. No resting tone is present. .. 5/5 motor on the right. LUE is 4/5 prox to distal. LLE is 4-/5. left PD. Able to stand unassisted.   Musculoskeletal: Full ROM, . no pain with ER, IR, and flexion of the the left hip.  Psych: Pt's affect is appropriate. Pt is cooperative. Affect more dynamic  Assessment & Plan:   ASSESSMENT: 1. Right BG/thalamic hemorrhage- he is making gradual progress  2. HTN  3. Dysphagia (resolved) .  4. Spastic left hemiparesis with intermittent spasms, especially at night  Plan:  1. Will add baclofen 10mg  qhs for spasm. Can use prn during the day 2. Dental mgt still needed for caries ---no changes there 3. Tramadol prn for pain. Continue to use tylenol, ice, and heat---would like to see further taper. rx'ed #90 with 1 RF  4. Blood pressure control with lopressor, HCTZ --per PCP  5. Continue HEP. Chair appears to be in decent shape.  6. Follow up here in about 4 months. Thirty minutes of face to face patient care time were spent during this visit. All questions were encouraged and answered.

## 2014-09-10 NOTE — Patient Instructions (Signed)
CONTINUE WITH REGULAR STRETCHING AND EXERCISES

## 2014-09-13 ENCOUNTER — Ambulatory Visit: Payer: BC Managed Care – PPO | Admitting: Physical Medicine & Rehabilitation

## 2014-09-26 ENCOUNTER — Encounter: Payer: BC Managed Care – PPO | Admitting: Internal Medicine

## 2014-11-21 ENCOUNTER — Ambulatory Visit (INDEPENDENT_AMBULATORY_CARE_PROVIDER_SITE_OTHER): Payer: Medicare Other | Admitting: Internal Medicine

## 2014-11-21 ENCOUNTER — Encounter: Payer: Self-pay | Admitting: Internal Medicine

## 2014-11-21 VITALS — BP 145/92 | HR 70 | Temp 98.2°F | Wt 244.9 lb

## 2014-11-21 DIAGNOSIS — Z0000 Encounter for general adult medical examination without abnormal findings: Secondary | ICD-10-CM

## 2014-11-21 DIAGNOSIS — G8114 Spastic hemiplegia affecting left nondominant side: Secondary | ICD-10-CM | POA: Diagnosis not present

## 2014-11-21 DIAGNOSIS — I10 Essential (primary) hypertension: Secondary | ICD-10-CM

## 2014-11-21 DIAGNOSIS — E785 Hyperlipidemia, unspecified: Secondary | ICD-10-CM

## 2014-11-21 DIAGNOSIS — Z23 Encounter for immunization: Secondary | ICD-10-CM

## 2014-11-21 MED ORDER — BACLOFEN 10 MG PO TABS: 10.0000 mg | ORAL_TABLET | Freq: Every day | ORAL | Status: DC

## 2014-11-21 NOTE — Patient Instructions (Signed)
General Instructions: Keep up the good work, let me know if your blood pressure stays elevated otherwise we will see you in about 6 months.   Please bring your medicines with you each time you come to clinic.  Medicines may include prescription medications, over-the-counter medications, herbal remedies, eye drops, vitamins, or other pills.   Progress Toward Treatment Goals:  Treatment Goal 05/23/2014  Blood pressure at goal    Self Care Goals & Plans:  Self Care Goal 11/21/2014  Manage my medications take my medicines as prescribed; bring my medications to every visit; refill my medications on time  Monitor my health keep track of my blood pressure  Eat healthy foods drink diet soda or water instead of juice or soda; eat more vegetables; eat foods that are low in salt; eat baked foods instead of fried foods  Be physically active find an activity I enjoy  Meeting treatment goals -    No flowsheet data found.   Care Management & Community Referrals:  Referral 05/23/2014  Referrals made for care management support none needed  Referrals made to community resources -

## 2014-11-21 NOTE — Progress Notes (Signed)
[facility] Subjective:   Patient ID: Jeremy [name] male   DOB: [birth year] 49 y.o.   MRN: [id number]  HPI: [name] is a 49 y.o. male with a PMH detailed below who presents for follow up of his HTN. As usual he presents with his wife and they bring a log of his BP.  This log shows he has been checking his blood pressure about 3x per week with about 90% of readings at goal.  The ones that are above are only mildly above 409 systolic and 81-19 diastolic.  They do report increased financial stress at home and feel this is contributing to BP elevations.  He has been taking his BP medications as directed.   He continues to have some spasms from his left hemiplegia, he also reports a new feeling of some tingling in his left leg, previously he did not feel anything.  He rates his pain from these spasms as 2/10 and report the bacofen helps.  He still has not been able to make a dental appointment.    Past Medical History  Diagnosis Date  . Hypertension 10/01/2011    basal ganglia  . Stroke ([place])   . High cholesterol    Current Outpatient Prescriptions  Medication Sig Dispense Refill  . aspirin EC 81 MG tablet Take 1 tablet (81 mg total) by mouth daily. 100 tablet 1  . atorvastatin (LIPITOR) 40 MG tablet Take 1 tablet (40 mg total) by mouth daily at 6 PM. 90 tablet 3  . baclofen (LIORESAL) 10 MG tablet Take 1 tablet (10 mg total) by mouth at bedtime. May use during the day PRN also for spasms 60 tablet 3  . hydrochlorothiazide (HYDRODIURIL) 25 MG tablet Take 1 tablet (25 mg total) by mouth daily. 90 tablet 3  . metoprolol (LOPRESSOR) 50 MG tablet Take 1 tablet (50 mg total) by mouth 2 (two) times daily. 180 tablet 3  . prochlorperazine (COMPAZINE) 10 MG tablet Take 1 tablet (10 mg total) by mouth every 6 (six) hours as needed for nausea. 30 tablet 5  . traMADol (ULTRAM) 50 MG tablet Take 1 tablet (50 mg total) by mouth every 8 (eight) hours as needed. 90 tablet 1    No current facility-administered medications for this visit.   Family History  Problem Relation Age of Onset  . Hypertension Mother   . Heart disease Father   . Gout Father   . Hypertension Father    Social History   Social History  . Marital Status: Married    Spouse Name: N/A  . Number of Children: 0  . Years of Education: 12th   Occupational History  . retired [employer]   Social History Main Topics  . Smoking status: Never Smoker   . Smokeless tobacco: None  . Alcohol Use: No  . Drug Use: No  . Sexual Activity: Not Asked   Other Topics Concern  . None   Social History Narrative   Patient lives with his wife..and drinks sodas.   Review of Systems: Review of Systems  Constitutional: Negative for fever, chills and malaise/fatigue.  Eyes: Negative for blurred vision.  Respiratory: Negative for cough.   Cardiovascular: Negative for chest pain.  Gastrointestinal: Negative for heartburn and abdominal pain.  Genitourinary: Negative for dysuria.  Musculoskeletal: Negative for falls.  Skin: Negative for rash.  Neurological: Positive for tingling. Negative for headaches.  Endo/Heme/Allergies: Negative for polydipsia.  Psychiatric/Behavioral: Negative for depression.    Objective:  Physical  Exam: Filed Vitals:   11/21/14 1432 11/21/14 1434  BP: 145/92   Pulse: 70   Temp: 98.2 F (36.8 C)   TempSrc: Oral   Weight:  244 lb 14.4 oz (111.086 kg)  SpO2: 100%   Physical Exam  Constitutional: He is oriented to person, place, and time and well-developed, well-nourished, and in no distress.  In [place]  HENT:  Head: Normocephalic and atraumatic.  Cardiovascular: Normal rate and regular rhythm.   Pulmonary/Chest: Effort normal and breath sounds normal.  Abdominal: Bowel sounds are normal.  Neurological: He is alert and oriented to person, place, and time.  Nursing note and vitals reviewed.   Assessment & Plan:  Case discussed with Dr.  Evette Doffing  Essential hypertension, benign BP Readings from Last 3 Encounters:  11/21/14 145/92  09/10/14 118/70  06/12/14 111/78    Lab Results  Component Value Date   NA 137 05/23/2014   K 3.6 05/23/2014   CREATININE 0.92 05/23/2014    Assessment: Blood pressure control: mildly elevated Progress toward BP goal:  deteriorated Comments: well controlled with home log  Plan: Medications:  Metoprolol 50mg  BID, HCTZ 25mg  daily Educational resources provided: handout Self management tools provided: home blood pressure logbook Other plans: patient and wife are very on top of his BP, if readings stay elevated they will make an earlier appointment otherwise will have him back in about 6-9 months.   Left spastic hemiparesis - Baclofen is still helping.  Dr Naaman Plummer has been mostly management this but patient requests a refill which I will provide.  Hyperlipidemia -Stable:LDL at goal  Routine health maintenance -Flu shot today    Medications Ordered Meds ordered this encounter  Medications  . baclofen (LIORESAL) 10 MG tablet    Sig: Take 1 tablet (10 mg total) by mouth at bedtime. May use during the day PRN also for spasms    Dispense:  60 tablet    Refill:  3   Other Orders Orders Placed This Encounter  Procedures  . Flu Vaccine QUAD 36+ mos IM   Follow Up: Return 5-6 months.

## 2014-11-24 NOTE — Assessment & Plan Note (Signed)
-  Stable:LDL at goal

## 2014-11-24 NOTE — Assessment & Plan Note (Signed)
BP Readings from Last 3 Encounters:  11/21/14 145/92  09/10/14 118/70  06/12/14 111/78    Lab Results  Component Value Date   NA 137 05/23/2014   K 3.6 05/23/2014   CREATININE 0.92 05/23/2014    Assessment: Blood pressure control: mildly elevated Progress toward BP goal:  deteriorated Comments: well controlled with home log  Plan: Medications:  Metoprolol 50mg  BID, HCTZ 25mg  daily Educational resources provided: handout Self management tools provided: home blood pressure logbook Other plans: patient and wife are very on top of his BP, if readings stay elevated they will make an earlier appointment otherwise will have him back in about 6-9 months.

## 2014-11-24 NOTE — Assessment & Plan Note (Signed)
-   Baclofen is still helping.  Dr Naaman Plummer has been mostly management this but patient requests a refill which I will provide.

## 2014-11-24 NOTE — Assessment & Plan Note (Signed)
Flu shot today 

## 2014-11-25 NOTE — Progress Notes (Signed)
Internal Medicine Clinic Attending  Case discussed with Dr. Hoffman at the time of the visit.  We reviewed the resident's history and exam and pertinent patient test results.  I agree with the assessment, diagnosis, and plan of care documented in the resident's note.  

## 2014-12-09 ENCOUNTER — Other Ambulatory Visit: Payer: Self-pay | Admitting: Neurology

## 2015-01-07 ENCOUNTER — Ambulatory Visit: Payer: BC Managed Care – PPO

## 2015-01-07 ENCOUNTER — Encounter: Payer: Medicare Other | Attending: Physical Medicine & Rehabilitation | Admitting: Physical Medicine & Rehabilitation

## 2015-01-07 ENCOUNTER — Ambulatory Visit: Payer: BC Managed Care – PPO | Admitting: Physical Medicine & Rehabilitation

## 2015-01-07 ENCOUNTER — Encounter: Payer: Self-pay | Admitting: Physical Medicine & Rehabilitation

## 2015-01-07 VITALS — BP 133/81 | HR 67

## 2015-01-07 DIAGNOSIS — Z8673 Personal history of transient ischemic attack (TIA), and cerebral infarction without residual deficits: Secondary | ICD-10-CM | POA: Insufficient documentation

## 2015-01-07 DIAGNOSIS — G8114 Spastic hemiplegia affecting left nondominant side: Secondary | ICD-10-CM | POA: Diagnosis not present

## 2015-01-07 DIAGNOSIS — I618 Other nontraumatic intracerebral hemorrhage: Secondary | ICD-10-CM | POA: Diagnosis not present

## 2015-01-07 DIAGNOSIS — K029 Dental caries, unspecified: Secondary | ICD-10-CM | POA: Diagnosis not present

## 2015-01-07 DIAGNOSIS — I610 Nontraumatic intracerebral hemorrhage in hemisphere, subcortical: Secondary | ICD-10-CM | POA: Diagnosis not present

## 2015-01-07 DIAGNOSIS — E7800 Pure hypercholesterolemia, unspecified: Secondary | ICD-10-CM | POA: Insufficient documentation

## 2015-01-07 DIAGNOSIS — I10 Essential (primary) hypertension: Secondary | ICD-10-CM | POA: Insufficient documentation

## 2015-01-07 MED ORDER — TRAMADOL HCL 50 MG PO TABS: 50.0000 mg | ORAL_TABLET | Freq: Three times a day (TID) | ORAL | Status: DC | PRN

## 2015-01-07 NOTE — Progress Notes (Signed)
Subjective:    Patient ID: Jeremy [name], male    DOB: [birth year], 49 y.o.   MRN: [id number]  HPI   Jeremy [name] is back regarding his right BG hemorrhage. He continues to keep up with his exercises daily. He likes to walk at home with his hemiwalker. He has the spasms still in his left leg, but they're better with the baclofen. He takes it before he goes to bed. He typically gets about 9-10 hours per night of sleep.   Otherwise he's feeling well. He has been to the dentis but was told he doesn't have benefits.    Pain Inventory Average Pain 3 Pain Right Now 0 My pain is aching  In the last 24 hours, has pain interfered with the following? General activity 1 Relation with others 0 Enjoyment of life 0 What TIME of day is your pain at its worst? morning Sleep (in general) Fair  Pain is worse with: sitting Pain improves with: pacing activities Relief from Meds: n/a  Mobility use a walker ability to climb steps?  no do you drive?  no use a wheelchair transfers alone  Function disabled: date disabled 09/2011  Neuro/Psych tingling spasms  Prior Studies Any changes since last visit?  no  Physicians involved in your care Any changes since last visit?  no   Family History  Problem Relation Age of Onset  . Hypertension Mother   . Heart disease Father   . Gout Father   . Hypertension Father    Social History   Social History  . Marital Status: Married    Spouse Name: N/A  . Number of Children: 0  . Years of Education: 12th   Occupational History  . retired [employer]   Social History Main Topics  . Smoking status: Never Smoker   . Smokeless tobacco: None  . Alcohol Use: No  . Drug Use: No  . Sexual Activity: Not Asked   Other Topics Concern  . None   Social History Narrative   Patient lives with his wife..and drinks sodas.   Past Surgical History  Procedure Laterality Date  . Tracheostomy tube placement  10/11/2011    Procedure:  TRACHEOSTOMY;  Surgeon: Melida Quitter, MD;  Location: [facility];  Service: ENT;  Laterality: N/A;   Past Medical History  Diagnosis Date  . Hypertension 10/01/2011    basal ganglia  . Stroke ([place])   . High cholesterol    BP 133/81 mmHg  Pulse 67  SpO2 98%  Opioid Risk Score:   Fall Risk Score:  `1  Depression screen PHQ 2/9  Depression screen [facility] 2/9 11/21/2014 09/10/2014 05/23/2014 05/14/2014 12/13/2013 08/09/2013 05/07/2013  Decreased Interest 0 3 0 3 0 0 0  Down, Depressed, Hopeless 0 0 0 0 0 0 0  PHQ - 2 Score 0 3 0 3 0 0 0  Altered sleeping - 1 - 1 - - -  Tired, decreased energy - 0 - 0 - - -  Change in appetite - 0 - 0 - - -  Feeling bad or failure about yourself  - 0 - 0 - - -  Trouble concentrating - 0 - 0 - - -  Moving slowly or fidgety/restless - 0 - 0 - - -  Suicidal thoughts - 0 - 0 - - -  PHQ-9 Score - 4 - 4 - - -      Review of Systems  Musculoskeletal: Positive for gait problem.  All other systems reviewed and are  negative.      Objective:   Physical Exam  General: Alert and oriented x 3, No apparent distress, overweight  HEENT: Head is normocephalic, atraumatic, PERRLA, EOMI, exopthalmos, sclera anicteric, oral mucosa pink and moist, dentition poor, ext ear canals clear,  Neck: Supple without JVD or lymphadenopathy  Heart: Reg rate and rhythm. No murmurs rubs or gallops  Chest: CTA bilaterally without wheezes, rales, or rhonchi; no distress  Abdomen: Soft, non-tender, non-distended, bowel sounds positive. .  Extremities: No clubbing, cyanosis, or edema. Pulses are 2+  Skin: Clean and intact without signs of breakdown  Neuro: Pt is cognitively appropriate with normal insight, memory, and awareness.speech volume improved, minimal dysarthria. Cranial nerve: improved tracking and conjugate gaze, mild left facial weakness still... Sensory exam remains 1/2 on the left at the thumb/hand. Reflexes are 3+ on the left. No resting tone is present.today. 5/5 motor on the  right. LUE is 4/5 prox to distal. LLE is 4-/5. left PD. Able to stand unassisted. Musculoskeletal: Full ROM, . no pain with ER, IR, and flexion of the the left hip.  Psych: Pt's affect is appropriate. Pt is cooperative. Affect more dynamic   Assessment & Plan:   ASSESSMENT: 1. Right BG/thalamic hemorrhage- he is making gradual progress  2. HTN  3. Dysphagia (resolved) .  4. Spastic left hemiparesis with intermittent spasms, especially at night  5. Dental caries  Plan:  1. Will add baclofen 10mg  qhs for spasm. Can use prn during the day 2. Dental mgt still needed for caries ---encouraged use of antiseptic mouthwash and improved hygiene for now until he has extractions 3. Tramadol prn for pain. Continue to use tylenol, ice, and heat---would like to see further taper. rx'ed #90 with 1 RF  4. Blood pressure control with lopressor, HCTZ --per PCP  5. Continue HEP. Marland Kitchen  6. Follow up here in about 4 months. Thirty minutes of face to face patient care time were spent during this visit. All questions were encouraged and answered.

## 2015-01-07 NOTE — Patient Instructions (Signed)
REMEMBER GOOD DENTAL HYGIENE---BRUSH YOUR TEETH AND USE ANTISEPTIC MOUTHWASH TWICE DAILY.  FLOSS IF YOU CAN ALSO

## 2015-03-13 ENCOUNTER — Encounter: Payer: Self-pay | Admitting: Licensed Clinical Social Worker

## 2015-03-17 ENCOUNTER — Other Ambulatory Visit: Payer: Self-pay | Admitting: *Deleted

## 2015-03-17 MED ORDER — HYDROCHLOROTHIAZIDE 25 MG PO TABS: 25.0000 mg | ORAL_TABLET | Freq: Every day | ORAL | Status: DC

## 2015-03-20 ENCOUNTER — Other Ambulatory Visit: Payer: Self-pay | Admitting: Internal Medicine

## 2015-03-20 NOTE — Telephone Encounter (Signed)
Called to [facility], [facility] [place]

## 2015-03-20 NOTE — Telephone Encounter (Signed)
Pt requesting hydrochlorothiazide to be filled @ [facility]. °

## 2015-03-26 ENCOUNTER — Encounter: Payer: Self-pay | Admitting: Licensed Clinical Social Worker

## 2015-03-26 NOTE — Progress Notes (Signed)
Patient ID: Jeremy [name], male   DOB: February 19, 1965, 50 y.o.   MRN: [id number] CSW received pt's completed and signed Part A SCAT application.  CSW completed Part B and faxed entire application to SCAT Eligibility office.  CSW placed call to pt/spouse and notified.  Upon chart review, note indicated pt was in need of dental services and without dental benefits.  CSW will inquire if pt/family would like information on local Dental resources.

## 2015-03-31 ENCOUNTER — Other Ambulatory Visit: Payer: Self-pay | Admitting: Internal Medicine

## 2015-03-31 MED ORDER — BACLOFEN 10 MG PO TABS: 10.0000 mg | ORAL_TABLET | Freq: Every day | ORAL | Status: DC

## 2015-03-31 NOTE — Telephone Encounter (Signed)
Patient needs refill for baclofen (LIORESAL) 10 MG tablet [facility]

## 2015-04-24 ENCOUNTER — Encounter: Payer: Self-pay | Admitting: Internal Medicine

## 2015-04-24 ENCOUNTER — Ambulatory Visit (INDEPENDENT_AMBULATORY_CARE_PROVIDER_SITE_OTHER): Payer: Medicare Other | Admitting: Internal Medicine

## 2015-04-24 VITALS — BP 118/78 | HR 76 | Temp 98.1°F

## 2015-04-24 DIAGNOSIS — I10 Essential (primary) hypertension: Secondary | ICD-10-CM

## 2015-04-24 DIAGNOSIS — K0889 Other specified disorders of teeth and supporting structures: Secondary | ICD-10-CM

## 2015-04-24 DIAGNOSIS — K029 Dental caries, unspecified: Secondary | ICD-10-CM | POA: Diagnosis not present

## 2015-04-24 DIAGNOSIS — E785 Hyperlipidemia, unspecified: Secondary | ICD-10-CM

## 2015-04-24 DIAGNOSIS — H6123 Impacted cerumen, bilateral: Secondary | ICD-10-CM | POA: Diagnosis not present

## 2015-04-24 DIAGNOSIS — H6120 Impacted cerumen, unspecified ear: Secondary | ICD-10-CM | POA: Insufficient documentation

## 2015-04-24 MED ORDER — METOPROLOL TARTRATE 50 MG PO TABS: 50.0000 mg | ORAL_TABLET | Freq: Two times a day (BID) | ORAL | Status: DC

## 2015-04-24 MED ORDER — ATORVASTATIN CALCIUM 40 MG PO TABS: 40.0000 mg | ORAL_TABLET | Freq: Every day | ORAL | Status: DC

## 2015-04-24 NOTE — Assessment & Plan Note (Signed)
HPI: Has not been able to see dentist, one of his front teeth has fallen out.  A: Dental caries, poor dentition  P: Referral to dentistry

## 2015-04-24 NOTE — Assessment & Plan Note (Signed)
HPI: Taking Atorvastatin daily  A: Hyperlipidemia  P:  -Continue atorvastatin -Check Lipid panel.

## 2015-04-24 NOTE — Progress Notes (Signed)
[facility] Subjective:   Patient ID: Jeremy [name] male   DOB: 21-Dec-1965 50 y.o.   MRN: [id number]  HPI: Jeremy [name] is a 50 y.o. male with a PMH detailed below who presents for follow up of HTN.  Please see problem based charting below for the status of his chronic medical problems.    Past Medical History  Diagnosis Date  . Hypertension 10/01/2011    basal ganglia  . Stroke ([place])   . High cholesterol    Current Outpatient Prescriptions  Medication Sig Dispense Refill  . ASPIRIN LOW DOSE 81 MG EC tablet TAKE 1 TABLET DAILY 100 tablet 1  . atorvastatin (LIPITOR) 40 MG tablet Take 1 tablet (40 mg total) by mouth daily at 6 PM. 90 tablet 3  . baclofen (LIORESAL) 10 MG tablet Take 1 tablet (10 mg total) by mouth at bedtime. May use during the day PRN also for spasms 60 tablet 3  . hydrochlorothiazide (HYDRODIURIL) 25 MG tablet Take 1 tablet (25 mg total) by mouth daily. 90 tablet 3  . metoprolol (LOPRESSOR) 50 MG tablet Take 1 tablet (50 mg total) by mouth 2 (two) times daily. 180 tablet 3  . prochlorperazine (COMPAZINE) 10 MG tablet Take 1 tablet (10 mg total) by mouth every 6 (six) hours as needed for nausea. 30 tablet 5  . traMADol (ULTRAM) 50 MG tablet Take 1 tablet (50 mg total) by mouth every 8 (eight) hours as needed. 90 tablet 1   No current facility-administered medications for this visit.   Family History  Problem Relation Age of Onset  . Hypertension Mother   . Heart disease Father   . Gout Father   . Hypertension Father    Social History   Social History  . Marital Status: Married    Spouse Name: N/A  . Number of Children: 0  . Years of Education: 12th   Occupational History  . retired [employer]   Social History Main Topics  . Smoking status: Never Smoker   . Smokeless tobacco: None  . Alcohol Use: No  . Drug Use: No  . Sexual Activity: Not Asked   Other Topics Concern  . None   Social History Narrative    Patient lives with his wife..and drinks sodas.   Review of Systems: Review of Systems  Constitutional: Negative for fever and malaise/fatigue.  Eyes: Negative for blurred vision.  Respiratory: Negative for cough.   Cardiovascular: Negative for chest pain.  Gastrointestinal: Negative for constipation.  Neurological: Positive for tingling. Negative for headaches.     Objective:  Physical Exam: Filed Vitals:   04/24/15 1420  BP: 158/90  Pulse: 76  Temp: 98.1 F (36.7 C)  TempSrc: Oral  SpO2: 97%   Physical Exam  Constitutional:  In power wheelchair  HENT:  Bilateral cerumen impaction  Eyes: Conjunctivae are normal.  Cardiovascular: Normal rate, regular rhythm and normal heart sounds.   Pulmonary/Chest: Effort normal and breath sounds normal.  Musculoskeletal: He exhibits no edema.  Nursing note and vitals reviewed.   Assessment & Plan:  Case discussed with Dr. Evette Doffing  Essential hypertension, benign HPI: He has been taking metoprolol and HCTZ without issue.  Wife has kept BP log, forgot to bring log today but BP has been well controlled at home.  A: Essential HTN at goal  P: - Continue HCTZ 25mg  daily and Metoprolol 50mg  BID, refilled. -Check BMP  Dental caries HPI: Has not been able to see dentist, one of  his front teeth has fallen out.  A: Dental caries, poor dentition  P: Referral to dentistry  Cerumen impaction HPI: Concerned about ear wax  A: Bilateral cerumen impaction  P: Treated with lavage with excellent results  Hyperlipidemia HPI: Taking Atorvastatin daily  A: Hyperlipidemia  P:  -Continue atorvastatin -Check Lipid panel.    Medications Ordered Meds ordered this encounter  Medications  . metoprolol (LOPRESSOR) 50 MG tablet    Sig: Take 1 tablet (50 mg total) by mouth 2 (two) times daily.    Dispense:  180 tablet    Refill:  3   Other Orders Orders Placed This Encounter  Procedures  . BMP8+Anion Gap  . Lipid Profile  .  Ambulatory referral to Dentistry    Referral Priority:  Routine    Referral Type:  Consultation    Referral Reason:  Specialty Services Required    Requested Specialty:  Dental General Practice    Number of Visits Requested:  1   Follow Up: Return in about 1 year (around 04/23/2016), or if symptoms worsen or fail to improve.

## 2015-04-24 NOTE — Assessment & Plan Note (Signed)
HPI: Concerned about ear wax  A: Bilateral cerumen impaction  P: Treated with lavage with excellent results

## 2015-04-24 NOTE — Assessment & Plan Note (Addendum)
HPI: He has been taking metoprolol and HCTZ without issue.  Wife has kept BP log, forgot to bring log today but BP has been well controlled at home.  A: Essential HTN at goal  P: - Continue HCTZ 25mg  daily and Metoprolol 50mg  BID, refilled. -Check BMP

## 2015-04-24 NOTE — Patient Instructions (Signed)
General Instructions:   Please bring your medicines with you each time you come to clinic.  Medicines may include prescription medications, over-the-counter medications, herbal remedies, eye drops, vitamins, or other pills.   Progress Toward Treatment Goals:  Treatment Goal 11/21/2014  Blood pressure deteriorated    Self Care Goals & Plans:  Self Care Goal 04/24/2015  Manage my medications take my medicines as prescribed; bring my medications to every visit; refill my medications on time  Monitor my health -  Eat healthy foods eat more vegetables; eat baked foods instead of fried foods; eat foods that are low in salt  Be physically active -    No flowsheet data found.   Care Management & Community Referrals:  Referral 11/21/2014  Referrals made for care management support none needed

## 2015-04-25 LAB — BMP8+ANION GAP
Anion Gap: 17 mmol/L (ref 10.0–18.0)
BUN/Creatinine Ratio: 17 (ref 9–20)
BUN: 16 mg/dL (ref 6–24)
CO2: 25 mmol/L (ref 18–29)
Calcium: 9.4 mg/dL (ref 8.7–10.2)
Chloride: 99 mmol/L (ref 96–106)
Creatinine, Ser: 0.96 mg/dL (ref 0.76–1.27)
GFR calc Af Amer: 107 mL/min/{1.73_m2} (ref >59)
GFR calc non Af Amer: 92 mL/min/{1.73_m2} (ref >59)
Glucose: 92 mg/dL (ref 65–99)
Potassium: 4.1 mmol/L (ref 3.5–5.2)
Sodium: 141 mmol/L (ref 134–144)

## 2015-04-25 LAB — LIPID PANEL
Chol/HDL Ratio: 4.8 ratio units (ref 0.0–5.0)
Cholesterol, Total: 155 mg/dL (ref 100–199)
HDL: 32 mg/dL — ABNORMAL LOW (ref >39)
LDL Calculated: 88 mg/dL (ref 0–99)
Triglycerides: 176 mg/dL — ABNORMAL HIGH (ref 0–149)
VLDL Cholesterol Cal: 35 mg/dL (ref 5–40)

## 2015-04-25 NOTE — Progress Notes (Signed)
Internal Medicine Clinic Attending  Case discussed with Dr. Hoffman at the time of the visit.  We reviewed the resident's history and exam and pertinent patient test results.  I agree with the assessment, diagnosis, and plan of care documented in the resident's note.  

## 2015-05-06 ENCOUNTER — Encounter: Payer: Medicare Other | Attending: Physical Medicine & Rehabilitation | Admitting: Physical Medicine & Rehabilitation

## 2015-05-06 ENCOUNTER — Encounter: Payer: Self-pay | Admitting: Physical Medicine & Rehabilitation

## 2015-05-06 VITALS — BP 142/85 | HR 68

## 2015-05-06 DIAGNOSIS — Z8673 Personal history of transient ischemic attack (TIA), and cerebral infarction without residual deficits: Secondary | ICD-10-CM | POA: Insufficient documentation

## 2015-05-06 DIAGNOSIS — E7800 Pure hypercholesterolemia, unspecified: Secondary | ICD-10-CM | POA: Insufficient documentation

## 2015-05-06 DIAGNOSIS — I618 Other nontraumatic intracerebral hemorrhage: Secondary | ICD-10-CM | POA: Insufficient documentation

## 2015-05-06 DIAGNOSIS — I610 Nontraumatic intracerebral hemorrhage in hemisphere, subcortical: Secondary | ICD-10-CM | POA: Insufficient documentation

## 2015-05-06 DIAGNOSIS — G8114 Spastic hemiplegia affecting left nondominant side: Secondary | ICD-10-CM | POA: Insufficient documentation

## 2015-05-06 DIAGNOSIS — K029 Dental caries, unspecified: Secondary | ICD-10-CM | POA: Diagnosis not present

## 2015-05-06 DIAGNOSIS — I10 Essential (primary) hypertension: Secondary | ICD-10-CM | POA: Diagnosis not present

## 2015-05-06 MED ORDER — TRAMADOL HCL 50 MG PO TABS: 50.0000 mg | ORAL_TABLET | Freq: Two times a day (BID) | ORAL | Status: DC | PRN

## 2015-05-06 NOTE — Progress Notes (Signed)
Subjective:    Patient ID: Jeremy [name], male    DOB: 09-29-1965, 50 y.o.   MRN: [id number]  HPI   Jeremy [name] is here in follow up of his right BG hemorrhage. He has been doing well since i last saw him. His pain has improved. The baclofen has helped his spasms. He is doing some short dx walking at home. He uses his power chair for longer distances.   He is using the tramadol typically once per day for arm pain. He takes the baclofen at night and rarely during the day.   Pain Inventory Average Pain . Pain Right Now . My pain is .  In the last 24 hours, has pain interfered with the following? General activity . Relation with others . Enjoyment of life . What TIME of day is your pain at its worst? . Sleep (in general) NA  Pain is worse with: . Pain improves with: . Relief from Meds: .  Mobility use a walker  Function disabled: date disabled .  Neuro/Psych tingling  Prior Studies Any changes since last visit?  no  Physicians involved in your care Any changes since last visit?  no   Family History  Problem Relation Age of Onset  . Hypertension Mother   . Heart disease Father   . Gout Father   . Hypertension Father    Social History   Social History  . Marital Status: Married    Spouse Name: N/A  . Number of Children: 0  . Years of Education: 12th   Occupational History  . retired [employer]   Social History Main Topics  . Smoking status: Never Smoker   . Smokeless tobacco: None  . Alcohol Use: No  . Drug Use: No  . Sexual Activity: Not Asked   Other Topics Concern  . None   Social History Narrative   Patient lives with his wife..and drinks sodas.   Past Surgical History  Procedure Laterality Date  . Tracheostomy tube placement  10/11/2011    Procedure: TRACHEOSTOMY;  Surgeon: Melida Quitter, MD;  Location: [facility];  Service: ENT;  Laterality: N/A;   Past Medical History  Diagnosis Date  . Hypertension 10/01/2011    basal ganglia  .  Stroke ([place])   . High cholesterol    BP 142/85 mmHg  Pulse 68  SpO2 96%  Opioid Risk Score:   Fall Risk Score:  `1  Depression screen PHQ 2/9  Depression screen [facility] 2/9 05/06/2015 04/24/2015 11/21/2014 09/10/2014 05/23/2014 05/14/2014 12/13/2013  Decreased Interest 2 0 0 3 0 3 0  Down, Depressed, Hopeless 0 0 0 0 0 0 0  PHQ - 2 Score 2 0 0 3 0 3 0  Altered sleeping 1 - - 1 - 1 -  Tired, decreased energy 1 - - 0 - 0 -  Change in appetite 1 - - 0 - 0 -  Feeling bad or failure about yourself  1 - - 0 - 0 -  Trouble concentrating 1 - - 0 - 0 -  Moving slowly or fidgety/restless 2 - - 0 - 0 -  Suicidal thoughts 0 - - 0 - 0 -  PHQ-9 Score 9 - - 4 - 4 -  Difficult doing work/chores Not difficult at all - - - - - -     Review of Systems     Objective:   Physical Exam  General: Alert and oriented x 3, No apparent distress, overweight  HEENT: Head is  normocephalic, atraumatic, PERRLA, EOMI, exopthalmos, sclera anicteric, oral mucosa pink and moist, dentition poor, ext ear canals clear,  Neck: Supple without JVD or lymphadenopathy  Heart: Reg rate and rhythm. No murmurs rubs or gallops  Chest: CTA bilaterally without wheezes, rales, or rhonchi; no distress  Abdomen: Soft, non-tender, non-distended, bowel sounds positive. .  Extremities: No clubbing, cyanosis, or edema. Pulses are 2+  Skin: Clean and intact without signs of breakdown  Neuro: Pt is cognitively appropriate with normal insight, memory, and awareness.speech volume improved, minimal dysarthria. Cranial nerve: improved tracking and conjugate gaze, mild left facial weakness still... Sensory exam remains 1/2 on the left at the thumb/hand. Reflexes are 3+ on the left. No resting tone is present.today. 5/5 motor on the right. LUE is 4/5 prox to distal. LLE is 4-/5. left PD. Able to stand unassisted.  Musculoskeletal: Full ROM, . no pain with ER, IR, and flexion of the the left hip. No pain with RTC maneuvers either. Psych: Pt's affect  is appropriate. Pt is cooperative. Affect more dynamic    Assessment & Plan:   ASSESSMENT: 1. Right BG/thalamic hemorrhage- he is making gradual progress  2. HTN  3. Dysphagia (resolved) .  4. Spastic left hemiparesis with intermittent spasms   5. Dental caries   Plan:  1. Continue baclofen 10mg  qhs for spasm. Can use prn during the day 2. Dental mgt still needed for caries --   3. Tramadol prn for pain. Work towards tapering off. Discussed use of tylenol instead.  4. Blood pressure control with lopressor, HCTZ --improved---per PCP  5. Continue HEP. Marland Kitchen  6. Follow up here in about 6 months. 15 minutes of face to face patient care time were spent during this visit. All questions were encouraged and answered.

## 2015-05-06 NOTE — Patient Instructions (Signed)
PLEASE TRY WEANING OFF TRAMADOL  CONSIDER TYLENOL 500MG  OR 650MG  IN PLACE OF TRAMADOL   PLEASE CALL ME WITH ANY PROBLEMS OR QUESTIONS [id number]).

## 2015-06-12 ENCOUNTER — Ambulatory Visit (INDEPENDENT_AMBULATORY_CARE_PROVIDER_SITE_OTHER): Payer: Medicare Other | Admitting: Neurology

## 2015-06-12 ENCOUNTER — Encounter: Payer: Self-pay | Admitting: Neurology

## 2015-06-12 VITALS — BP 141/98 | HR 71 | Ht 70.0 in | Wt 244.2 lb

## 2015-06-12 DIAGNOSIS — I6990 Unspecified sequelae of unspecified cerebrovascular disease: Secondary | ICD-10-CM | POA: Diagnosis not present

## 2015-06-12 NOTE — Patient Instructions (Signed)
I had a long d/w patient and his wife about his remote brain hemorrhage and post stroke mild left hemiplegia, risk for recurrent stroke/TIAs, personally independently reviewed imaging studies and stroke evaluation results and answered questions.Continue aspirin 81 mg daily  for secondary stroke prevention and maintain strict control of hypertension with blood pressure goal below 130/90, I advised him to f/u with his primary MD for stricter BP control. Also maintain control of   lipids with LDL cholesterol goal below 70 mg/dL. I also advised the patient to eat a healthy diet with plenty of whole grains, cereals, fruits and vegetables, exercise regularly and maintain ideal body weight . Since patient has remained stable for nearly 4 years since his brain hemorrhage and do not believe routine further follow-up appointment with me is necessary. He was advised to continue close follow-up with his primary physician and he may be referred back to me in the future as needed

## 2015-06-12 NOTE — Progress Notes (Signed)
PATIENT: Jeremy [name] DOB: [birth year]  REASON FOR VISIT: follow up HISTORY FROM: patient  HISTORY OF PRESENT ILLNESS: Jeremy [name] is a 50 year old male with history of hypertension not treated times years. He was admitted on October 01, 2011, with headache nausea, vomiting, and difficulty using left hand. CT of head done revealed right basal ganglia hemorrhage with surrounding edema. He was started on nicardipine drip for blood pressure control. He did develop somnolence due to extension of hemorrhage to right thalamus and posterior third ventricle with concerns for developing obstructive hydrocephalus. Intraventricular cath was placed by Dr. Annette Stable. He did require replacement of catheter due to malfunction on August 21,2013. He had difficulty weaning of the vent and was trached on October 11, 2011, and gastrostomy tube was placed by Interventional Radiology on October 13, 2011. The patient has had an episode of projectile vomiting. On October 20, 2011, KUB done showed no evidence of obstruction. Rate was adjusted with resolution of symptoms. His trach has been downsized to cuffless Shiley 6 done and regular diet initiated. The patient continued to be impaired due to dense left hemiparesis and sensory deficits, left neglect aphonia with cognitive deficits, as well as visual deficits in upper fields.   UPDATE 09/14/12 (LL): Patient comes to office for first time since [place] almost 1 year ago. He is accompanied by his spouse. He has made slow gradual progress. Able to eat regular diet, still has dense left hemiparesis and sensory deficits, left neglect, with cognitive deficits. He has completed all of the therapy sessions that insurance would allow. He is not able to walk, despite having AFO brace for LLE and decent strength. Has been taking medications as prescribed, with close monitoring from spouse. BP is under good control.   UPDATE 05/11/13 (LL): Patient comes in for [facility] follow up, last visit 6  months ago.  He has regained some more strength in right upper and lower extremities but still unable to bear weight on left leg.  He states he has regained more feeling on his left hand and left lateral torso since last visit.  His wife states he has some short term memory problems, but she helps him with his medications.  He has no complaints today.  States he has only very occasional left hip pain.  BP in office today is 141/84.  His recent lipid panel showed total cholesterol of 236 and LDL of 169; he was started on Lipitor 40 mg. Update 06/12/2014 : he returns for follow-up after last visit 1 year ago. Is accompanied by his wife. Patient is now able to walk with a walker at home and uses a wheelchair only for long distances. He has opted improvement in his left-sided strength but still has diminished fine motor skills in the left hand and some pain and spasticity. He is fairly independent and can do most of his activities by himself. His blood pressure is been running usually in the 110/80 range and occasionally can go higher. He saw his family physician last week. He has a new complaint of decreased hearing in the left ear but denies any pain. He has not yet had this evaluated. He is tolerating Lipitor well but has not had any follow-up lipid profile checked that I can  See Update 06/12/2015 : He returns for follow-up accompanied by his wife after last visit a year ago. Patient has had no further recurrent stroke or TIA symptoms since his right basal ganglia hemorrhage in 2013. He is able to  walk with a walker but can walk short distances with a cane. He uses a wheelchair for long distances. He has not had any falls. He continues to have left-sided weakness and incoordination. He states his blood pressure is well controlled today it is 141/98 in office. The patient have is tolerating Lipitor well without myalgias or arthralgias. His last lipid profile was checked last month and was satisfactory. He remains on  aspirin which is tolerating well without bleeding or bruising. Patient continues to have left knee pain which is had for a while since a fall several years ago. REVIEW OF SYSTEMS: Full 14 system review of systems performed and notable only for: gait and balance difficulty, left hand weakness and clumsiness only and all other systems negative   ALLERGIES: Allergies  Allergen Reactions  . Amlodipine Dermatitis    Patient and wife report skin rash in groin while on medication, once stopped the rash resolved.  . Lisinopril Itching    HOME MEDICATIONS: Outpatient Prescriptions Prior to Visit  Medication Sig Dispense Refill  . ASPIRIN LOW DOSE 81 MG EC tablet TAKE 1 TABLET DAILY 100 tablet 1  . atorvastatin (LIPITOR) 40 MG tablet Take 1 tablet (40 mg total) by mouth daily at 6 PM. 90 tablet 3  . baclofen (LIORESAL) 10 MG tablet Take 1 tablet (10 mg total) by mouth at bedtime. May use during the day PRN also for spasms 60 tablet 3  . hydrochlorothiazide (HYDRODIURIL) 25 MG tablet Take 1 tablet (25 mg total) by mouth daily. 90 tablet 3  . metoprolol (LOPRESSOR) 50 MG tablet Take 1 tablet (50 mg total) by mouth 2 (two) times daily. 180 tablet 3  . prochlorperazine (COMPAZINE) 10 MG tablet Take 1 tablet (10 mg total) by mouth every 6 (six) hours as needed for nausea. 30 tablet 5  . traMADol (ULTRAM) 50 MG tablet Take 1 tablet (50 mg total) by mouth every 12 (twelve) hours as needed. 60 tablet 1   No facility-administered medications prior to visit.   PHYSICAL EXAM  Filed Vitals:   06/12/15 1014  BP: 141/98  Pulse: 71  Height: 5\' 10"  (1.778 m)  Weight: 244 lb 3.2 oz (110.768 kg)   Body mass index is 35.04 kg/(m^2).  Generalized: In no acute distress, obese AA male in wheelchair  Neck: Supple, no carotid bruits  Cardiac: Regular rate rhythm, no murmur  Pulmonary: Clear to auscultation bilaterally  Musculoskeletal: No deformity   Neurological examination  Mentation: Alert oriented to  time, place, history taking, language fluent, soft voice diminished attention registration and recall. Mini-Mental status exam not done Cranial nerve II-XII: Pupils were equal round reactive to light extraocular movements were full, visual field were full on confrontational test. facial sensation decreased on left, strength normal. hearing was intact to finger rubbing bilaterally. Uvula tongue midline. head turning and shoulder shrug and were normal and symmetric.Tongue protrusion into cheek strength was normal.  MOTOR: 5/5 motor on the right. Mild left sided weakness graded 4/5 with mild weakness of left grip and ankle dorsiflexors. Increased tone on the left with mild spasticity.  SENSORY: Sensory exam shows diminished sensation on the left. Normal on the right.  COORDINATION: FTF slow on left, but able. Normal on right.  REFLEXES: Reflexes are 3+ on the left. 2+ right  GAIT/STATION: unable. Sitting in a wheelchair   ASSESSMENT AND PLAN 49 y.o. year old male has a past medical history of Hypertension and High cholesterol here for follow up for  Right basal  ganglia ICH on (10/01/2011). Has made slow, gradual recovery.  .    I had a long d/w patient and his wife about his remote brain hemorrhage and post stroke mild left hemiplegia, risk for recurrent stroke/TIAs, personally independently reviewed imaging studies and stroke evaluation results and answered questions.Continue aspirin 81 mg daily  for secondary stroke prevention and maintain strict control of hypertension with blood pressure goal below 130/90, I advised him to f/u with his primary MD for stricter BP control. Also maintain control of   lipids with LDL cholesterol goal below 70 mg/dL. I also advised the patient to eat a healthy diet with plenty of whole grains, cereals, fruits and vegetables, exercise regularly and maintain ideal body weight . Since patient has remained stable for nearly 4 years since his brain hemorrhage and do not believe  routine further follow-up appointment with me is necessary. Greater than 50% time during this 25 minute visit was spent on counseling and coordination of care about his stroke risk He was advised to continue close follow-up with his primary physician and he may be referred back to me in the future as needed   Antony Contras, MD  06/12/2015, 6:36 PM [facility] [address] [place], [place] [address] [phone]  Note: This document was prepared with digital dictation and possible smart phrase technology. Any transcriptional errors that result from this process are unintentional.

## 2015-10-22 ENCOUNTER — Telehealth: Payer: Self-pay

## 2015-10-22 NOTE — Telephone Encounter (Signed)
Pt called requesting a refill on Baclofen.

## 2015-10-23 ENCOUNTER — Other Ambulatory Visit: Payer: Self-pay

## 2015-10-23 ENCOUNTER — Other Ambulatory Visit: Payer: Self-pay | Admitting: *Deleted

## 2015-10-23 MED ORDER — BACLOFEN 10 MG PO TABS: 10.0000 mg | ORAL_TABLET | Freq: Every day | ORAL | 3 refills | Status: DC

## 2015-10-23 NOTE — Telephone Encounter (Signed)
Our doctors did not refill last rx. Left message with pt to contact Dr. Jodene Nam office for refills.

## 2015-10-23 NOTE — Telephone Encounter (Signed)
Requesting baclofen to be filled @ [facility].

## 2015-10-23 NOTE — Telephone Encounter (Signed)
Call and inform pt rx was sent to his pharmacy today.

## 2015-11-05 ENCOUNTER — Encounter: Payer: Medicare Other | Attending: Physical Medicine & Rehabilitation | Admitting: Physical Medicine & Rehabilitation

## 2015-11-05 ENCOUNTER — Encounter: Payer: Self-pay | Admitting: Physical Medicine & Rehabilitation

## 2015-11-05 VITALS — BP 129/88 | HR 67 | Resp 16

## 2015-11-05 DIAGNOSIS — K029 Dental caries, unspecified: Secondary | ICD-10-CM | POA: Insufficient documentation

## 2015-11-05 DIAGNOSIS — I69254 Hemiplegia and hemiparesis following other nontraumatic intracranial hemorrhage affecting left non-dominant side: Secondary | ICD-10-CM | POA: Insufficient documentation

## 2015-11-05 DIAGNOSIS — E7800 Pure hypercholesterolemia, unspecified: Secondary | ICD-10-CM | POA: Diagnosis not present

## 2015-11-05 DIAGNOSIS — I610 Nontraumatic intracerebral hemorrhage in hemisphere, subcortical: Secondary | ICD-10-CM

## 2015-11-05 DIAGNOSIS — Z09 Encounter for follow-up examination after completed treatment for conditions other than malignant neoplasm: Secondary | ICD-10-CM | POA: Insufficient documentation

## 2015-11-05 DIAGNOSIS — I10 Essential (primary) hypertension: Secondary | ICD-10-CM | POA: Insufficient documentation

## 2015-11-05 DIAGNOSIS — G8114 Spastic hemiplegia affecting left nondominant side: Secondary | ICD-10-CM | POA: Diagnosis not present

## 2015-11-05 NOTE — Progress Notes (Signed)
Subjective:    Patient ID: Jeremy [name], male    DOB: March 15, 1965, 50 y.o.   MRN: [id number]  HPI   Emilson is here in follow up of his right BG hemorrhage. He has been doing fairly well. He walks daily up to about 30 minutes and does ROM exercises for his arms as well. He likes to practice his "punching bag" movements.   From a pain standpoint, he's doing.  He sometimes has some shoulder pain and complains of his leg being heavy.   He's eating more fruits and vegetables to try to be more healthy.  Dental assessment is under way.   For pain he's using tylenol. He's come off the tramadol completely.   Pain Inventory Average Pain 4 Pain Right Now 4 My pain is burning  In the last 24 hours, has pain interfered with the following? General activity 0 Relation with others 0 Enjoyment of life 0 What TIME of day is your pain at its worst? night Sleep (in general) Good  Pain is worse with: unsure and temperature Pain improves with: therapy/exercise Relief from Meds: 6  Mobility use a walker  Function disabled: date disabled 10/01/11 I need assistance with the following:  household duties and shopping  Neuro/Psych spasms  Prior Studies Any changes since last visit?  no  Physicians involved in your care Any changes since last visit?  no   Family History  Problem Relation Age of Onset  . Hypertension Mother   . Heart disease Father   . Gout Father   . Hypertension Father    Social History   Social History  . Marital status: Married    Spouse name: N/A  . Number of children: 0  . Years of education: 12th   Occupational History  . retired [employer]   Social History Main Topics  . Smoking status: Never Smoker  . Smokeless tobacco: None  . Alcohol use No  . Drug use: No  . Sexual activity: Not Asked   Other Topics Concern  . None   Social History Narrative   Patient lives with his wife..and drinks sodas.   Past Surgical History:    Procedure Laterality Date  . TRACHEOSTOMY TUBE PLACEMENT  10/11/2011   Procedure: TRACHEOSTOMY;  Surgeon: Melida Quitter, MD;  Location: [facility];  Service: ENT;  Laterality: N/A;   Past Medical History:  Diagnosis Date  . High cholesterol   . Hypertension 10/01/2011   basal ganglia  . Stroke ([place])    BP 129/88   Pulse 67   Resp 16   SpO2 93%   Opioid Risk Score:   Fall Risk Score:  `1  Depression screen PHQ 2/9  Depression screen [facility] 2/9 11/05/2015 05/06/2015 04/24/2015 11/21/2014 09/10/2014 05/23/2014 05/14/2014  Decreased Interest 0 2 0 0 3 0 3  Down, Depressed, Hopeless 0 0 0 0 0 0 0  PHQ - 2 Score 0 2 0 0 3 0 3  Altered sleeping - 1 - - 1 - 1  Tired, decreased energy - 1 - - 0 - 0  Change in appetite - 1 - - 0 - 0  Feeling bad or failure about yourself  - 1 - - 0 - 0  Trouble concentrating - 1 - - 0 - 0  Moving slowly or fidgety/restless - 2 - - 0 - 0  Suicidal thoughts - 0 - - 0 - 0  PHQ-9 Score - 9 - - 4 - 4  Difficult doing work/chores -  Not difficult at all - - - - -    Review of Systems  All other systems reviewed and are negative.      Objective:   Physical Exam  General: Alert and oriented x 3, No apparent distress, overweight  HEENT: Head is normocephalic, atraumatic, PERRLA, EOMI, exopthalmos, sclera anicteric, oral mucosa pink and moist, dentition poor, ext ear canals clear,  Neck: Supple without JVD or lymphadenopathy  Heart: Reg rate and rhythm. No murmurs rubs or gallops  Chest: CTA bilaterally without wheezes, rales, or rhonchi; no distress  Abdomen: Soft, non-tender, non-distended, bowel sounds positive. .  Extremities: No clubbing, cyanosis, or edema. Pulses are 2+  Skin: Clean and intact without signs of breakdown  Neuro: Pt is cognitively appropriate with normal insight, memory, and awareness.speech volume improved, minimal dysarthria. Cranial nerve: mild left facial weakness still. Sensory exam remains 1 to 1+/2 on the left at the thumb/hand. Reflexes are  3+ on the left. No resting tone is present.today. 5/5 motor on the right. LUE is 4+/5 prox to distal. LLE is 4/5. left PD. Able to stand unassisted.  Musculoskeletal: Full ROM, . no pain with ER, IR, and flexion of the the left hip. No pain with RTC maneuvers either. Psych: Pt's affect is appropriate. Pt is cooperative. Affect more dynamic    Assessment & Plan:   ASSESSMENT: 1. Right BG/thalamic hemorrhage- he is making gradual progress  2. HTN  3. Dysphagia (resolved) .  4. Spastic left hemiparesis with intermittent spasms   5. Dental caries   Plan:  1. Continue baclofen 10mg  qhs for spasm. Can use prn during the day 2. Still needs dental work.  3. Tylenol for pain.  4. Blood pressure control with lopressor, HCTZ  5. Continue HEP. .Encouraged ongoing ambulation/rom exercises. He's done a good job! 6. Follow up here in about 12 months. 15 minutes of face to face patient care time were spent during this visit. All questions were encouraged and answered.

## 2015-11-05 NOTE — Patient Instructions (Signed)
PLEASE CALL ME WITH ANY PROBLEMS OR QUESTIONS [phone])  KEEP UP THE GOOD WORK!!!

## 2015-11-17 ENCOUNTER — Other Ambulatory Visit: Payer: Self-pay | Admitting: Internal Medicine

## 2015-11-17 NOTE — Telephone Encounter (Signed)
hydrochlorothiazide (HYDRODIURIL) 25 MG tablet [facility] [place]

## 2015-11-18 MED ORDER — HYDROCHLOROTHIAZIDE 25 MG PO TABS: 25.0000 mg | ORAL_TABLET | Freq: Every day | ORAL | 3 refills | Status: DC

## 2015-12-30 ENCOUNTER — Ambulatory Visit (INDEPENDENT_AMBULATORY_CARE_PROVIDER_SITE_OTHER): Payer: BC Managed Care – PPO | Admitting: *Deleted

## 2015-12-30 DIAGNOSIS — Z23 Encounter for immunization: Secondary | ICD-10-CM | POA: Diagnosis not present

## 2016-04-06 ENCOUNTER — Encounter: Payer: Self-pay | Admitting: *Deleted

## 2016-04-19 ENCOUNTER — Other Ambulatory Visit: Payer: Self-pay

## 2016-04-19 DIAGNOSIS — E785 Hyperlipidemia, unspecified: Secondary | ICD-10-CM

## 2016-04-19 MED ORDER — ATORVASTATIN CALCIUM 40 MG PO TABS: 40.0000 mg | ORAL_TABLET | Freq: Every day | ORAL | 3 refills | Status: DC

## 2016-04-19 MED ORDER — BACLOFEN 10 MG PO TABS: 10.0000 mg | ORAL_TABLET | Freq: Every day | ORAL | 3 refills | Status: DC

## 2016-04-19 NOTE — Telephone Encounter (Signed)
atorvastatin (LIPITOR) 40 MG tablet, baclofen (LIORESAL) 10 MG tablet, refill request @ [facility] on [address].

## 2016-04-21 ENCOUNTER — Other Ambulatory Visit: Payer: Self-pay

## 2016-04-21 DIAGNOSIS — E785 Hyperlipidemia, unspecified: Secondary | ICD-10-CM

## 2016-04-21 MED ORDER — HYDROCHLOROTHIAZIDE 25 MG PO TABS: 25.0000 mg | ORAL_TABLET | Freq: Every day | ORAL | 3 refills | Status: DC

## 2016-04-21 MED ORDER — ASPIRIN 81 MG PO TBEC: 81.0000 mg | DELAYED_RELEASE_TABLET | Freq: Every day | ORAL | 3 refills | Status: DC

## 2016-04-21 MED ORDER — METOPROLOL TARTRATE 50 MG PO TABS: 50.0000 mg | ORAL_TABLET | Freq: Two times a day (BID) | ORAL | 3 refills | Status: DC

## 2016-04-21 NOTE — Telephone Encounter (Signed)
I refilled his Baclofen and Atorvastatin 2 days ago.

## 2016-04-21 NOTE — Telephone Encounter (Signed)
Needs to speak with a nurse about meds.  

## 2016-04-22 ENCOUNTER — Telehealth: Payer: Self-pay | Admitting: *Deleted

## 2016-04-22 ENCOUNTER — Ambulatory Visit: Payer: Medicare Other | Admitting: Internal Medicine

## 2016-04-22 NOTE — Telephone Encounter (Signed)
LMTCB- need to clarify. [facility]

## 2016-04-22 NOTE — Telephone Encounter (Signed)
Pt calling nurse back °

## 2016-04-23 NOTE — Telephone Encounter (Signed)
Spoke to patient & informed him that Atorvastatin & Baclofen  [facility]. Advised patient to call his preferred pharmacy ([facility]) to get these meds transferred. Patient agreed.

## 2016-04-23 NOTE — Telephone Encounter (Signed)
Pt needs to speak with a nurse about meds. Please call back.  

## 2016-04-26 NOTE — Telephone Encounter (Signed)
Went straight to [facility]

## 2016-04-27 ENCOUNTER — Other Ambulatory Visit: Payer: Self-pay | Admitting: Internal Medicine

## 2016-04-27 NOTE — Telephone Encounter (Signed)
This has been addressed 3 times, see note from today 3/13

## 2016-04-27 NOTE — Telephone Encounter (Signed)
Called pt, reminded him of conversation previously w/ daisyrn, he states he does not know how to do that, called [facility] and [facility] scripts and also [facility] also confirmed w/ pt that he does not want [facility] anymore and removed from his profile

## 2016-04-27 NOTE — Telephone Encounter (Signed)
atorvastatin (LIPITOR) 40 MG tablet baclofen (LIORESAL) 10 MG tablet  [facility] [address]

## 2016-05-12 ENCOUNTER — Encounter: Payer: Self-pay | Admitting: Internal Medicine

## 2016-05-13 ENCOUNTER — Ambulatory Visit (INDEPENDENT_AMBULATORY_CARE_PROVIDER_SITE_OTHER): Payer: Medicare Other | Admitting: Internal Medicine

## 2016-05-13 ENCOUNTER — Encounter (INDEPENDENT_AMBULATORY_CARE_PROVIDER_SITE_OTHER): Payer: Self-pay

## 2016-05-13 VITALS — BP 148/86 | HR 68 | Temp 98.4°F | Ht 69.5 in | Wt 277.0 lb

## 2016-05-13 DIAGNOSIS — Z7982 Long term (current) use of aspirin: Secondary | ICD-10-CM

## 2016-05-13 DIAGNOSIS — E559 Vitamin D deficiency, unspecified: Secondary | ICD-10-CM | POA: Diagnosis not present

## 2016-05-13 DIAGNOSIS — Z6841 Body Mass Index (BMI) 40.0 and over, adult: Secondary | ICD-10-CM | POA: Diagnosis not present

## 2016-05-13 DIAGNOSIS — G8114 Spastic hemiplegia affecting left nondominant side: Secondary | ICD-10-CM | POA: Diagnosis not present

## 2016-05-13 DIAGNOSIS — M791 Myalgia, unspecified site: Secondary | ICD-10-CM

## 2016-05-13 DIAGNOSIS — I619 Nontraumatic intracerebral hemorrhage, unspecified: Secondary | ICD-10-CM

## 2016-05-13 DIAGNOSIS — Z79899 Other long term (current) drug therapy: Secondary | ICD-10-CM

## 2016-05-13 DIAGNOSIS — Z1211 Encounter for screening for malignant neoplasm of colon: Secondary | ICD-10-CM

## 2016-05-13 DIAGNOSIS — I10 Essential (primary) hypertension: Secondary | ICD-10-CM

## 2016-05-13 DIAGNOSIS — I69154 Hemiplegia and hemiparesis following nontraumatic intracerebral hemorrhage affecting left non-dominant side: Secondary | ICD-10-CM | POA: Diagnosis not present

## 2016-05-13 DIAGNOSIS — Z0000 Encounter for general adult medical examination without abnormal findings: Secondary | ICD-10-CM

## 2016-05-13 MED ORDER — METOPROLOL TARTRATE 50 MG PO TABS: 50.0000 mg | ORAL_TABLET | Freq: Two times a day (BID) | ORAL | 3 refills | Status: DC

## 2016-05-13 NOTE — Assessment & Plan Note (Signed)
Willing to get screening colonoscopy

## 2016-05-13 NOTE — Assessment & Plan Note (Signed)
HPI: Brings his medications, taking all as directed, no complaints.  He also brings extensive home BP logs, BP at home running in 110s-120s/70s-80s.  A: Essential HTN well controlled (by home logs)  P:Continue HCTZ 25mg  daily, Metoprolol tartrate 50mg  BID -F/U in 3-4 months

## 2016-05-13 NOTE — Assessment & Plan Note (Signed)
Wt Readings from Last 5 Encounters:  05/13/16 277 lb (125.6 kg)  06/12/15 244 lb 3.2 oz (110.8 kg)  11/21/14 244 lb 14.4 oz (111.1 kg)  06/12/14 246 lb (111.6 kg)  05/23/14 246 lb 8 oz (111.8 kg)    HPI: I noted patient has gained 33 lbs since our last visit. He reports this is due to dietary indiscrestion.    A: Morbid obesity  P: I discussed the importance of weight loss especially given his HTN.  He will work on this for his next visit.

## 2016-05-13 NOTE — Progress Notes (Signed)
[facility] Subjective:  HPI: [name] is a 51 y.o. male who presents for f/u HTN  Please see problem based charting below for the status of his chronic medical problems.     Review of Systems: Review of Systems  Constitutional: Negative for fever, malaise/fatigue and weight loss.  Respiratory: Negative for cough.   Cardiovascular: Negative for chest pain.  Musculoskeletal: Positive for myalgias. Negative for joint pain.  Neurological: Negative for dizziness.  Endo/Heme/Allergies: Negative for polydipsia.    Objective:  Physical Exam: Vitals:   05/13/16 1010 05/13/16 1020  BP: (!) 157/98 (!) 148/86  Pulse: 68   Temp: 98.4 F (36.9 C)   TempSrc: Oral   SpO2: 97%   Weight: 277 lb (125.6 kg)   Height: 5' 9.5" (1.765 m)   Physical Exam  Constitutional: No distress.  In wheelchair  HENT:  Poor dentition  Cardiovascular: Normal rate and regular rhythm.   Pulmonary/Chest: Effort normal and breath sounds normal.  Musculoskeletal: He exhibits no edema.  Psychiatric: Affect normal.  Nursing note and vitals reviewed.  Assessment & Plan:  Essential hypertension, benign HPI: Brings his medications, taking all as directed, no complaints.  He also brings extensive home BP logs, BP at home running in 110s-120s/70s-80s.  A: Essential HTN well controlled (by home logs)  P:Continue HCTZ 25mg  daily, Metoprolol tartrate 50mg  BID -F/U in 3-4 months  Spastic hemiparesis of left nondominant side due to nontraumatic intracerebral hemorrhage (HCC) HPI: Doing well, reports bacofen continues to help some.  He does however occasionally have some muscle cramps and is wondering if a muscle rub would help.  A: Spastic hemiparesis  P;Continue baclofen, ASA 81mg , BP control -Will check vitamin D given he is also on atorvastatin.  Morbid obesity with BMI of 40.0-44.9, adult ([place]) Wt Readings from Last 5 Encounters:  05/13/16 277 lb (125.6 kg)  06/12/15 244  lb 3.2 oz (110.8 kg)  11/21/14 244 lb 14.4 oz (111.1 kg)  06/12/14 246 lb (111.6 kg)  05/23/14 246 lb 8 oz (111.8 kg)    HPI: I noted patient has gained 33 lbs since our last visit. He reports this is due to dietary indiscrestion.    A: Morbid obesity  P: I discussed the importance of weight loss especially given his HTN.  He will work on this for his next visit.  Routine health maintenance Willing to get screening colonoscopy   Medications Ordered Meds ordered this encounter  Medications  . metoprolol (LOPRESSOR) 50 MG tablet    Sig: Take 1 tablet (50 mg total) by mouth 2 (two) times daily.    Dispense:  180 tablet    Refill:  3   Other Orders Orders Placed This Encounter  Procedures  . Lipid Profile  . BMP8+Anion Gap  . Vitamin D (25 hydroxy)  . Ambulatory referral to Gastroenterology    Referral Priority:   Routine    Referral Type:   Consultation    Referral Reason:   Specialty Services Required    Number of Visits Requested:   1   Follow Up: Return in about 3 months (around 08/13/2016).

## 2016-05-13 NOTE — Patient Instructions (Signed)
DASH Eating Plan DASH stands for "Dietary Approaches to Stop Hypertension." The DASH eating plan is a healthy eating plan that has been shown to reduce high blood pressure (hypertension). It may also reduce your risk for type 2 diabetes, heart disease, and stroke. The DASH eating plan may also help with weight loss. What are tips for following this plan? General guidelines  Avoid eating more than 2,300 mg (milligrams) of salt (sodium) a day. If you have hypertension, you may need to reduce your sodium intake to 1,500 mg a day.  Limit alcohol intake to no more than 1 drink a day for nonpregnant women and 2 drinks a day for men. One drink equals 12 oz of beer, 5 oz of wine, or 1 oz of hard liquor.  Work with your health care provider to maintain a healthy body weight or to lose weight. Ask what an ideal weight is for you.  Get at least 30 minutes of exercise that causes your heart to beat faster (aerobic exercise) most days of the week. Activities may include walking, swimming, or biking.  Work with your health care provider or diet and nutrition specialist (dietitian) to adjust your eating plan to your individual calorie needs. Reading food labels  Check food labels for the amount of sodium per serving. Choose foods with less than 5 percent of the Daily Value of sodium. Generally, foods with less than 300 mg of sodium per serving fit into this eating plan.  To find whole grains, look for the word "whole" as the first word in the ingredient list. Shopping  Buy products labeled as "low-sodium" or "no salt added."  Buy fresh foods. Avoid canned foods and premade or frozen meals. Cooking  Avoid adding salt when cooking. Use salt-free seasonings or herbs instead of table salt or sea salt. Check with your health care provider or pharmacist before using salt substitutes.  Do not fry foods. Cook foods using healthy methods such as baking, boiling, grilling, and broiling instead.  Cook with  heart-healthy oils, such as olive, canola, soybean, or sunflower oil. Meal planning   Eat a balanced diet that includes: ? 5 or more servings of fruits and vegetables each day. At each meal, try to fill half of your plate with fruits and vegetables. ? Up to 6-8 servings of whole grains each day. ? Less than 6 oz of lean meat, poultry, or fish each day. A 3-oz serving of meat is about the same size as a deck of cards. One egg equals 1 oz. ? 2 servings of low-fat dairy each day. ? A serving of nuts, seeds, or beans 5 times each week. ? Heart-healthy fats. Healthy fats called Omega-3 fatty acids are found in foods such as flaxseeds and coldwater fish, like sardines, salmon, and mackerel.  Limit how much you eat of the following: ? Canned or prepackaged foods. ? Food that is high in trans fat, such as fried foods. ? Food that is high in saturated fat, such as fatty meat. ? Sweets, desserts, sugary drinks, and other foods with added sugar. ? Full-fat dairy products.  Do not salt foods before eating.  Try to eat at least 2 vegetarian meals each week.  Eat more home-cooked food and less restaurant, buffet, and fast food.  When eating at a restaurant, ask that your food be prepared with less salt or no salt, if possible. What foods are recommended? The items listed may not be a complete list. Talk with your dietitian about what   dietary choices are best for you. Grains Whole-grain or whole-wheat bread. Whole-grain or whole-wheat pasta. Brown rice. Oatmeal. Quinoa. Bulgur. Whole-grain and low-sodium cereals. Pita bread. Low-fat, low-sodium crackers. Whole-wheat flour tortillas. Vegetables Fresh or frozen vegetables (raw, steamed, roasted, or grilled). Low-sodium or reduced-sodium tomato and vegetable juice. Low-sodium or reduced-sodium tomato sauce and tomato paste. Low-sodium or reduced-sodium canned vegetables. Fruits All fresh, dried, or frozen fruit. Canned fruit in natural juice (without  added sugar). Meat and other protein foods Skinless chicken or turkey. Ground chicken or turkey. Pork with fat trimmed off. Fish and seafood. Egg whites. Dried beans, peas, or lentils. Unsalted nuts, nut butters, and seeds. Unsalted canned beans. Lean cuts of beef with fat trimmed off. Low-sodium, lean deli meat. Dairy Low-fat (1%) or fat-free (skim) milk. Fat-free, low-fat, or reduced-fat cheeses. Nonfat, low-sodium ricotta or cottage cheese. Low-fat or nonfat yogurt. Low-fat, low-sodium cheese. Fats and oils Soft margarine without trans fats. Vegetable oil. Low-fat, reduced-fat, or light mayonnaise and salad dressings (reduced-sodium). Canola, safflower, olive, soybean, and sunflower oils. Avocado. Seasoning and other foods Herbs. Spices. Seasoning mixes without salt. Unsalted popcorn and pretzels. Fat-free sweets. What foods are not recommended? The items listed may not be a complete list. Talk with your dietitian about what dietary choices are best for you. Grains Baked goods made with fat, such as croissants, muffins, or some breads. Dry pasta or rice meal packs. Vegetables Creamed or fried vegetables. Vegetables in a cheese sauce. Regular canned vegetables (not low-sodium or reduced-sodium). Regular canned tomato sauce and paste (not low-sodium or reduced-sodium). Regular tomato and vegetable juice (not low-sodium or reduced-sodium). Pickles. Olives. Fruits Canned fruit in a light or heavy syrup. Fried fruit. Fruit in cream or butter sauce. Meat and other protein foods Fatty cuts of meat. Ribs. Fried meat. Bacon. Sausage. Bologna and other processed lunch meats. Salami. Fatback. Hotdogs. Bratwurst. Salted nuts and seeds. Canned beans with added salt. Canned or smoked fish. Whole eggs or egg yolks. Chicken or turkey with skin. Dairy Whole or 2% milk, cream, and half-and-half. Whole or full-fat cream cheese. Whole-fat or sweetened yogurt. Full-fat cheese. Nondairy creamers. Whipped toppings.  Processed cheese and cheese spreads. Fats and oils Butter. Stick margarine. Lard. Shortening. Ghee. Bacon fat. Tropical oils, such as coconut, palm kernel, or palm oil. Seasoning and other foods Salted popcorn and pretzels. Onion salt, garlic salt, seasoned salt, table salt, and sea salt. Worcestershire sauce. Tartar sauce. Barbecue sauce. Teriyaki sauce. Soy sauce, including reduced-sodium. Steak sauce. Canned and packaged gravies. Fish sauce. Oyster sauce. Cocktail sauce. Horseradish that you find on the shelf. Ketchup. Mustard. Meat flavorings and tenderizers. Bouillon cubes. Hot sauce and Tabasco sauce. Premade or packaged marinades. Premade or packaged taco seasonings. Relishes. Regular salad dressings. Where to find more information:  National Heart, Lung, and Blood Institute: www.nhlbi.nih.gov  American Heart Association: www.heart.org Summary  The DASH eating plan is a healthy eating plan that has been shown to reduce high blood pressure (hypertension). It may also reduce your risk for type 2 diabetes, heart disease, and stroke.  With the DASH eating plan, you should limit salt (sodium) intake to 2,300 mg a day. If you have hypertension, you may need to reduce your sodium intake to 1,500 mg a day.  When on the DASH eating plan, aim to eat more fresh fruits and vegetables, whole grains, lean proteins, low-fat dairy, and heart-healthy fats.  Work with your health care provider or diet and nutrition specialist (dietitian) to adjust your eating plan to your individual   calorie needs. This information is not intended to replace advice given to you by your health care provider. Make sure you discuss any questions you have with your health care provider. Document Released: 01/21/2011 Document Revised: 01/26/2016 Document Reviewed: 01/26/2016 Elsevier Interactive Patient Education  2017 Elsevier Inc.  

## 2016-05-13 NOTE — Assessment & Plan Note (Signed)
HPI: Doing well, reports bacofen continues to help some.  He does however occasionally have some muscle cramps and is wondering if a muscle rub would help.  A: Spastic hemiparesis  P;Continue baclofen, ASA 81mg , BP control -Will check vitamin D given he is also on atorvastatin.

## 2016-05-14 LAB — BMP8+ANION GAP
Anion Gap: 16 mmol/L (ref 10.0–18.0)
BUN/Creatinine Ratio: 13 (ref 9–20)
BUN: 12 mg/dL (ref 6–24)
CO2: 26 mmol/L (ref 18–29)
Calcium: 9.8 mg/dL (ref 8.7–10.2)
Chloride: 100 mmol/L (ref 96–106)
Creatinine, Ser: 0.91 mg/dL (ref 0.76–1.27)
GFR calc Af Amer: 113 mL/min/{1.73_m2} (ref >59)
GFR calc non Af Amer: 98 mL/min/{1.73_m2} (ref >59)
Glucose: 87 mg/dL (ref 65–99)
Potassium: 4 mmol/L (ref 3.5–5.2)
Sodium: 142 mmol/L (ref 134–144)

## 2016-05-14 LAB — LIPID PANEL
Chol/HDL Ratio: 4.8 ratio units (ref 0.0–5.0)
Cholesterol, Total: 145 mg/dL (ref 100–199)
HDL: 30 mg/dL — ABNORMAL LOW (ref >39)
LDL Calculated: 83 mg/dL (ref 0–99)
Triglycerides: 161 mg/dL — ABNORMAL HIGH (ref 0–149)
VLDL Cholesterol Cal: 32 mg/dL (ref 5–40)

## 2016-05-14 LAB — VITAMIN D 25 HYDROXY (VIT D DEFICIENCY, FRACTURES): Vit D, 25-Hydroxy: 12.4 ng/mL — ABNORMAL LOW (ref 30.0–100.0)

## 2016-05-17 ENCOUNTER — Telehealth: Payer: Self-pay | Admitting: Internal Medicine

## 2016-05-17 DIAGNOSIS — E559 Vitamin D deficiency, unspecified: Secondary | ICD-10-CM | POA: Insufficient documentation

## 2016-05-17 DIAGNOSIS — L304 Erythema intertrigo: Secondary | ICD-10-CM

## 2016-05-17 MED ORDER — VITAMIN D3 25 MCG (1000 UNIT) PO TABS: 1000.0000 [IU] | ORAL_TABLET | Freq: Every day | ORAL | Status: DC

## 2016-05-17 MED ORDER — NYSTATIN 100000 UNIT/GM EX POWD: Freq: Three times a day (TID) | CUTANEOUS | 0 refills | Status: DC

## 2016-05-17 NOTE — Assessment & Plan Note (Signed)
Vitamin D 12.4. Instructed to start 1000 IU of Vitamin D3 daily.  Recheck in 3 months

## 2016-05-17 NOTE — Telephone Encounter (Signed)
Instructed patient to start Vitamin D3 1000 units daily.  Also prescribed Nystatin for abdominal rash.  See A&P  Notes for additional details.

## 2016-05-17 NOTE — Assessment & Plan Note (Signed)
Called and spoke with Addie and his wife linda today, [name] also informed me that sabin has had a rash in the creases of his abdomen lately due to increased exercise.  She has been trying baby powder and corn starch on it but has not significantly improved. I will go ahead and send in a prescription for nystatin powder.  Will reelvaluate at next visit.

## 2016-05-31 DIAGNOSIS — Z6838 Body mass index (BMI) 38.0-38.9, adult: Secondary | ICD-10-CM | POA: Diagnosis not present

## 2016-05-31 DIAGNOSIS — E669 Obesity, unspecified: Secondary | ICD-10-CM | POA: Diagnosis not present

## 2016-05-31 DIAGNOSIS — G8114 Spastic hemiplegia affecting left nondominant side: Secondary | ICD-10-CM | POA: Diagnosis not present

## 2016-05-31 DIAGNOSIS — I6930 Unspecified sequelae of cerebral infarction: Secondary | ICD-10-CM | POA: Diagnosis not present

## 2016-05-31 DIAGNOSIS — Z1211 Encounter for screening for malignant neoplasm of colon: Secondary | ICD-10-CM | POA: Diagnosis not present

## 2016-06-24 ENCOUNTER — Other Ambulatory Visit: Payer: Self-pay | Admitting: Gastroenterology

## 2016-07-07 ENCOUNTER — Encounter (HOSPITAL_COMMUNITY): Admission: RE | Payer: Self-pay | Source: Ambulatory Visit

## 2016-07-07 ENCOUNTER — Ambulatory Visit (HOSPITAL_COMMUNITY): Admission: RE | Admit: 2016-07-07 | Payer: Medicare Other | Source: Ambulatory Visit | Admitting: Gastroenterology

## 2016-07-07 SURGERY — COLONOSCOPY WITH PROPOFOL
Anesthesia: Monitor Anesthesia Care

## 2016-07-20 DIAGNOSIS — Z1211 Encounter for screening for malignant neoplasm of colon: Secondary | ICD-10-CM | POA: Diagnosis not present

## 2016-07-20 DIAGNOSIS — Z1212 Encounter for screening for malignant neoplasm of rectum: Secondary | ICD-10-CM | POA: Diagnosis not present

## 2016-08-30 ENCOUNTER — Other Ambulatory Visit: Payer: Self-pay | Admitting: *Deleted

## 2016-08-30 MED ORDER — HYDROCHLOROTHIAZIDE 25 MG PO TABS: 25.0000 mg | ORAL_TABLET | Freq: Every day | ORAL | 3 refills | Status: DC

## 2016-09-17 LAB — COLOGUARD

## 2016-09-21 NOTE — Progress Notes (Signed)
[facility] Subjective:  HPI: [name] is a 51 y.o. male who presents for f/u HTN  Please see Assessment and Plan below for the status of his chronic medical problems.  Review of Systems: No HA, no fever, no chest pain, no SOB Objective:  Physical Exam: Vitals:   09/23/16 0927  BP: (!) 168/97  Pulse: 65  Temp: 98 F (36.7 C)  TempSrc: Oral  SpO2: 97%  Weight: 277 lb 12.8 oz (126 kg)  Height: 5\' 9"  (1.753 m)   Physical Exam  Constitutional: He is well-developed, well-nourished, and in no distress.  In power wheelchair  HENT:  Poor detention   Cardiovascular: Normal rate, regular rhythm and normal heart sounds.   Pulmonary/Chest: Effort normal and breath sounds normal.  Abdominal: Soft. Bowel sounds are normal.  Some hyperpigmentation along lower left inguinal crease  Musculoskeletal: He exhibits tenderness (left subsacuplar area). He exhibits no edema.  Psychiatric: Affect normal.  Nursing note and vitals reviewed.   Assessment & Plan:  Essential hypertension, benign HPI: Patient here with his wife, brough all medications, understands how to take them and reports compliance.  They have continued home BP monitoring with daily BP readings.  All home readings were reviewed and BP well controlled with readings between 825-053 systolic and 97-67 diastolic.  A: Essential HTN well controlled, possible component of white coat hypertension  P: Continues to be better controlled at home than our readings here.   -Continue HCTZ 25mg  daily -Continue metoprolol 50mg  BID  Intertriginous dermatitis associated with moisture -Refilled nystatin powder  Morbid obesity with BMI of 40.0-44.9, adult ([place]) -Discussed importance of weight loss - Discussed calorie counting basics  Vitamin D deficiency HPI: He has been taking 1000 IU of vitamin D3. No coplaints  A: Vitamin D deficency  P: Repeat Vit D up to 27. Overall this is excellent will disucss  with him he may increase to 2000 units to get above 30.  Hyperopia HPI: Reports he has been having some difficulty with fine print. Has not seen an eye doctor in many years.  Difficulty is in both eyes.  A: Hyperopia  P: Referral to optometry    Medications Ordered Meds ordered this encounter  Medications  . baclofen (LIORESAL) 10 MG tablet    Sig: Take 1 tablet (10 mg total) by mouth at bedtime. May use during the day PRN also for spasms    Dispense:  60 tablet    Refill:  3  . nystatin (NYSTATIN) powder    Sig: Apply topically 3 (three) times daily.    Dispense:  30 g    Refill:  2   Other Orders Orders Placed This Encounter  Procedures  . Vitamin D (25 hydroxy)  . Ambulatory referral to Optometry    Referral Priority:   Routine    Referral Type:   [facility])    Referral Reason:   Specialty Services Required    Requested Specialty:   Optometry    Number of Visits Requested:   1   Follow Up: Return 3-6 months.

## 2016-09-23 ENCOUNTER — Ambulatory Visit (INDEPENDENT_AMBULATORY_CARE_PROVIDER_SITE_OTHER): Payer: Medicare Other | Admitting: Internal Medicine

## 2016-09-23 VITALS — BP 168/97 | HR 65 | Temp 98.0°F | Ht 69.0 in | Wt 277.8 lb

## 2016-09-23 DIAGNOSIS — L304 Erythema intertrigo: Secondary | ICD-10-CM | POA: Diagnosis not present

## 2016-09-23 DIAGNOSIS — Z6841 Body Mass Index (BMI) 40.0 and over, adult: Secondary | ICD-10-CM | POA: Diagnosis not present

## 2016-09-23 DIAGNOSIS — I10 Essential (primary) hypertension: Secondary | ICD-10-CM | POA: Diagnosis present

## 2016-09-23 DIAGNOSIS — E559 Vitamin D deficiency, unspecified: Secondary | ICD-10-CM | POA: Diagnosis not present

## 2016-09-23 DIAGNOSIS — Z8249 Family history of ischemic heart disease and other diseases of the circulatory system: Secondary | ICD-10-CM | POA: Diagnosis not present

## 2016-09-23 DIAGNOSIS — H5203 Hypermetropia, bilateral: Secondary | ICD-10-CM | POA: Diagnosis not present

## 2016-09-23 DIAGNOSIS — H5200 Hypermetropia, unspecified eye: Secondary | ICD-10-CM | POA: Insufficient documentation

## 2016-09-23 MED ORDER — NYSTATIN 100000 UNIT/GM EX POWD: Freq: Three times a day (TID) | CUTANEOUS | 2 refills | Status: DC

## 2016-09-23 MED ORDER — BACLOFEN 10 MG PO TABS: 10.0000 mg | ORAL_TABLET | Freq: Every day | ORAL | 3 refills | Status: DC

## 2016-09-23 NOTE — Patient Instructions (Signed)
I will call with the results of you blood test.

## 2016-09-24 LAB — VITAMIN D 25 HYDROXY (VIT D DEFICIENCY, FRACTURES): Vit D, 25-Hydroxy: 27.3 ng/mL — ABNORMAL LOW (ref 30.0–100.0)

## 2016-09-24 NOTE — Assessment & Plan Note (Signed)
Refilled nystatin powder.

## 2016-09-24 NOTE — Assessment & Plan Note (Signed)
-  Discussed importance of weight loss - Discussed calorie counting basics

## 2016-09-24 NOTE — Assessment & Plan Note (Signed)
HPI: He has been taking 1000 IU of vitamin D3. No coplaints  A: Vitamin D deficency  P: Repeat Vit D up to 27. Overall this is excellent will disucss with him he may increase to 2000 units to get above 30.

## 2016-09-24 NOTE — Assessment & Plan Note (Signed)
HPI: Patient here with his wife, brough all medications, understands how to take them and reports compliance.  They have continued home BP monitoring with daily BP readings.  All home readings were reviewed and BP well controlled with readings between 421-031 systolic and 28-11 diastolic.  A: Essential HTN well controlled, possible component of white coat hypertension  P: Continues to be better controlled at home than our readings here.   -Continue HCTZ 25mg  daily -Continue metoprolol 50mg  BID

## 2016-09-24 NOTE — Assessment & Plan Note (Signed)
HPI: Reports he has been having some difficulty with fine print. Has not seen an eye doctor in many years.  Difficulty is in both eyes.  A: Hyperopia  P: Referral to optometry

## 2016-10-25 ENCOUNTER — Other Ambulatory Visit: Payer: Self-pay | Admitting: Internal Medicine

## 2016-11-03 ENCOUNTER — Encounter: Payer: Medicare Other | Attending: Physical Medicine & Rehabilitation | Admitting: Physical Medicine & Rehabilitation

## 2016-11-03 ENCOUNTER — Encounter: Payer: Self-pay | Admitting: Physical Medicine & Rehabilitation

## 2016-11-03 VITALS — BP 142/94 | HR 76 | Resp 14

## 2016-11-03 DIAGNOSIS — I619 Nontraumatic intracerebral hemorrhage, unspecified: Secondary | ICD-10-CM | POA: Diagnosis not present

## 2016-11-03 DIAGNOSIS — Z09 Encounter for follow-up examination after completed treatment for conditions other than malignant neoplasm: Secondary | ICD-10-CM | POA: Diagnosis not present

## 2016-11-03 DIAGNOSIS — E7800 Pure hypercholesterolemia, unspecified: Secondary | ICD-10-CM | POA: Insufficient documentation

## 2016-11-03 DIAGNOSIS — Z8673 Personal history of transient ischemic attack (TIA), and cerebral infarction without residual deficits: Secondary | ICD-10-CM | POA: Diagnosis not present

## 2016-11-03 DIAGNOSIS — K029 Dental caries, unspecified: Secondary | ICD-10-CM | POA: Insufficient documentation

## 2016-11-03 DIAGNOSIS — G8114 Spastic hemiplegia affecting left nondominant side: Secondary | ICD-10-CM

## 2016-11-03 DIAGNOSIS — I69254 Hemiplegia and hemiparesis following other nontraumatic intracranial hemorrhage affecting left non-dominant side: Secondary | ICD-10-CM | POA: Diagnosis not present

## 2016-11-03 DIAGNOSIS — I10 Essential (primary) hypertension: Secondary | ICD-10-CM | POA: Insufficient documentation

## 2016-11-03 MED ORDER — BACLOFEN 10 MG PO TABS: 10.0000 mg | ORAL_TABLET | Freq: Two times a day (BID) | ORAL | 3 refills | Status: DC | PRN

## 2016-11-03 NOTE — Patient Instructions (Signed)
TYLENOL/ACETAMINOPHEN: CAN TAKE UP TO 2500MG  DAILY.  CERTAINLY TAKE ONE AFTER YOU RETURN FROM THE CENTER.   YOU CAN USE BACLOFEN FOR BACK/SHOULDER PAIN AND TIGHTNESS. DON'T FORGET HEAT AND ICE.   SHOULDER STRETCHES

## 2016-11-03 NOTE — Progress Notes (Signed)
Subjective:    Patient ID: Jeremy [name], male    DOB: 1965-04-19, 51 y.o.   MRN: [id number]  HPI   Jeremy [name] is here in follow up of his right BG hemorrhage. He has been going to the adult center during the day for the last 11 months or so. He is walking for short distances at home to perform his hygiene and personal care tasks. He can dress/bathe himself. He hasn't had any falls or mishaps per his wife. He states that his spasticity is under the control but that he has pain in his left shoulder and "side". For the pain he uses icey hot, heat, ice, basic HEP, tylenol. He tends to hurt most when he comes home from the adult center. He uses baclofen at night only.   His bladder is emptying normally. He occasionally has constipation which is responsive to softeners/laxatives.   Pain Inventory Average Pain 2 Pain Right Now 2 My pain is tightness  In the last 24 hours, has pain interfered with the following? General activity 0 Relation with others 0 Enjoyment of life 0 What TIME of day is your pain at its worst? no pain Sleep (in general) Fair  Pain is worse with: no pain Pain improves with: no pain Relief from Meds: no pain  Mobility walk with assistance use a walker do you drive?  no use a wheelchair  Function disabled: date disabled .  Neuro/Psych tingling  Prior Studies Any changes since last visit?  no  Physicians involved in your care Any changes since last visit?  no   Family History  Problem Relation Age of Onset  . Hypertension Mother   . Heart disease Father   . Gout Father   . Hypertension Father    Social History   Social History  . Marital status: Married    Spouse name: N/A  . Number of children: 0  . Years of education: 12th   Occupational History  . retired [employer]   Social History Main Topics  . Smoking status: Never Smoker  . Smokeless tobacco: Never Used  . Alcohol use No  . Drug use: No  . Sexual activity: Not  Asked   Other Topics Concern  . None   Social History Narrative   Patient lives with his wife..and drinks sodas.   Past Surgical History:  Procedure Laterality Date  . TRACHEOSTOMY TUBE PLACEMENT  10/11/2011   Procedure: TRACHEOSTOMY;  Surgeon: Melida Quitter, MD;  Location: [facility];  Service: ENT;  Laterality: N/A;   Past Medical History:  Diagnosis Date  . High cholesterol   . Hypertension 10/01/2011   basal ganglia  . ICH (intracerebral hemorrhage) ([place]) 10/01/2011   ICH in 09/2011.    . Stroke ([place])    BP (!) 142/94 (BP Location: Right Arm, Patient Position: Sitting, Cuff Size: Large)   Pulse 76   Resp 14   SpO2 95%   Opioid Risk Score:   Fall Risk Score:  `1  Depression screen PHQ 2/9  Depression screen [facility] 2/9 09/23/2016 05/13/2016 11/05/2015 05/06/2015 04/24/2015 11/21/2014 09/10/2014  Decreased Interest 0 0 0 2 0 0 3  Down, Depressed, Hopeless 0 0 0 0 0 0 0  PHQ - 2 Score 0 0 0 2 0 0 3  Altered sleeping - - - 1 - - 1  Tired, decreased energy - - - 1 - - 0  Change in appetite - - - 1 - - 0  Feeling bad or  failure about yourself  - - - 1 - - 0  Trouble concentrating - - - 1 - - 0  Moving slowly or fidgety/restless - - - 2 - - 0  Suicidal thoughts - - - 0 - - 0  PHQ-9 Score - - - 9 - - 4  Difficult doing work/chores - - - Not difficult at all - - -    Review of Systems  Constitutional: Negative.   HENT: Negative.   Eyes: Negative.   Respiratory: Negative.   Cardiovascular: Negative.   Gastrointestinal: Negative.   Endocrine: Negative.   Genitourinary: Negative.   Musculoskeletal: Positive for gait problem.  Skin: Negative.   Allergic/Immunologic: Negative.   Neurological:       Tingling  Hematological: Negative.   Psychiatric/Behavioral: Negative.   All other systems reviewed and are negative.      Objective:   Physical Exam General: Alert and oriented x 3, No apparent distress, overweight  HEENT:Head is normocephalic, atraumatic, PERRLA, EOMI, exopthalmos,  sclera anicteric, oral mucosa pink and moist, dentition poor, ext ear canals clear,  Neck:Supple without JVD or lymphadenopathy  Heart:RRR Chest:CTA Bilaterally without wheezes or rales. Normal effort  Abdomen:Soft, non-tender, non-distended, bowel sounds positive. .  Extremities:No clubbing, cyanosis, or edema. Pulses are 2+  Skin:Clean and intact without signs of breakdown  Neuro:Pt is cognitively appropriate with normal insight, memory, and awareness.speech volume improved, minimal dysarthria. Cranial nerve: mild left facial weakness still. Sensory exam remains 1 to 1+/2 on the left at the thumb/hand. Reflexes are 3+ on the left. No resting tone. 5/5 motor on the right. LUE is 4+/5 prox to distal. LLE is 4+/5. Stands unassisted.  Musculoskeletal:mild pain in the inferior scapular region/flank with AROM and PROM of scapula. Minimal RTC pain.  Psych:pleasant and appropriate   Assessment & Plan:  ASSESSMENT: 1. Right BG/thalamic hemorrhage- he is making gradual progress  2. HTN  3. Dysphagia (resolved) .  4. Spastic left hemiparesis with intermittent spasms  5. Dental caries   Plan:  1. Continue baclofen. Use more frequently during the day for spasms.  2. Still needs completion of dental work 3. Tylenol for pain. Can use more liberally 4. Blood pressure control with lopressor, HCTZ  5. Continue HEP. Marland Kitchenprovided him shoulder exercises today. 6. Follow up here in about 12 months. 15 minutes of face to face patient care time were spent during this visit. All questions were encouraged and answered.

## 2016-12-20 ENCOUNTER — Ambulatory Visit (INDEPENDENT_AMBULATORY_CARE_PROVIDER_SITE_OTHER): Payer: Medicare Other | Admitting: *Deleted

## 2016-12-20 DIAGNOSIS — Z23 Encounter for immunization: Secondary | ICD-10-CM | POA: Diagnosis present

## 2017-03-22 ENCOUNTER — Other Ambulatory Visit: Payer: Self-pay | Admitting: Physical Medicine & Rehabilitation

## 2017-03-22 ENCOUNTER — Other Ambulatory Visit: Payer: Self-pay

## 2017-03-22 DIAGNOSIS — G8114 Spastic hemiplegia affecting left nondominant side: Secondary | ICD-10-CM

## 2017-03-22 DIAGNOSIS — I619 Nontraumatic intracerebral hemorrhage, unspecified: Secondary | ICD-10-CM

## 2017-03-22 MED ORDER — BACLOFEN 10 MG PO TABS: 10.0000 mg | ORAL_TABLET | Freq: Two times a day (BID) | ORAL | 3 refills | Status: DC | PRN

## 2017-03-31 ENCOUNTER — Ambulatory Visit: Payer: Medicare Other | Admitting: Internal Medicine

## 2017-03-31 ENCOUNTER — Ambulatory Visit (INDEPENDENT_AMBULATORY_CARE_PROVIDER_SITE_OTHER): Payer: BC Managed Care – PPO | Admitting: Internal Medicine

## 2017-03-31 ENCOUNTER — Encounter: Payer: Self-pay | Admitting: Internal Medicine

## 2017-03-31 ENCOUNTER — Other Ambulatory Visit: Payer: Self-pay | Admitting: *Deleted

## 2017-03-31 ENCOUNTER — Encounter (INDEPENDENT_AMBULATORY_CARE_PROVIDER_SITE_OTHER): Payer: Self-pay

## 2017-03-31 VITALS — BP 146/89 | HR 85 | Temp 98.0°F | Ht 69.0 in | Wt 288.0 lb

## 2017-03-31 DIAGNOSIS — E785 Hyperlipidemia, unspecified: Secondary | ICD-10-CM

## 2017-03-31 DIAGNOSIS — E559 Vitamin D deficiency, unspecified: Secondary | ICD-10-CM

## 2017-03-31 DIAGNOSIS — Z8673 Personal history of transient ischemic attack (TIA), and cerebral infarction without residual deficits: Secondary | ICD-10-CM | POA: Diagnosis not present

## 2017-03-31 DIAGNOSIS — Z79899 Other long term (current) drug therapy: Secondary | ICD-10-CM | POA: Diagnosis not present

## 2017-03-31 DIAGNOSIS — I10 Essential (primary) hypertension: Secondary | ICD-10-CM

## 2017-03-31 DIAGNOSIS — Z6841 Body Mass Index (BMI) 40.0 and over, adult: Secondary | ICD-10-CM | POA: Diagnosis not present

## 2017-03-31 DIAGNOSIS — E7800 Pure hypercholesterolemia, unspecified: Secondary | ICD-10-CM

## 2017-03-31 MED ORDER — ATORVASTATIN CALCIUM 40 MG PO TABS: 40.0000 mg | ORAL_TABLET | Freq: Every day | ORAL | 3 refills | Status: DC

## 2017-03-31 MED ORDER — CHLORTHALIDONE 50 MG PO TABS: 25.0000 mg | ORAL_TABLET | Freq: Every day | ORAL | 1 refills | Status: DC

## 2017-03-31 NOTE — Telephone Encounter (Signed)
Next appt scheduled  4/4 with PCP. 

## 2017-03-31 NOTE — Progress Notes (Signed)
   CC: follow up of hypertension   HPI:  [name] is a 52 y.o. with PMH as listed below who presents for follow up of hypertension. Please see the assessment and plans for the status of the patient chronic medical problems.    Past Medical History:  Diagnosis Date  . High cholesterol   . Hypertension 10/01/2011   basal ganglia  . ICH (intracerebral hemorrhage) ([place]) 10/01/2011   ICH in 09/2011.    . Stroke [facility])    Review of Systems:  Refer to history of present illness and assessment and plans for pertinent review of systems, all others reviewed and negative  Physical Exam:  Vitals:   03/31/17 0950  BP: (!) 146/89  Pulse: 85  Temp: 98 F (36.7 C)  TempSrc: Oral  Weight: 288 lb (130.6 kg)  Height: 5\' 9"  (1.753 m)   General: well appearing, no acute distress  Cardiac : regular rate and rhythm, no peripheral edema  Pulm: lungs clear to auscultation, no respiratory distress   Assessment & Plan:   Hypertension   Blood pressure is on the elevated side today. Patient reports good compliance with medications and monitors his blood pressure at home. He has his log with him today - BPs range 120-140/60-90, most readings are SBP 130s. Given history of stroke and current blood pressure goal recommendations, will change to a more potent thiazide diuretic. Last BMP was one year ago - normal kidney function and electrolytes.  -Continue home blood pressure readings  -start chlorthalidone 50 mg daily - discontinue HCTZ 25 mg daily   - continue metoprolol 50 mg BID  - BMP today   Morbid obesity  BMI 42 with multiple co morbidities including HTN and HLD. Weight increased 10 lbs from the time of last office visit.  - continue to encourage exercise and healthy eating, counseled on healthy food choices   Vitamin D deficiency  Vitamin D at the time of last office visit 27. Vitamin D was increased to 2000 IU daily with a goal to improve Vit D level to 30 - continue vitamin D 2000 IU  daily   Hyperlipidemia  History of hemorrhagic stroke. Taking atorvastatin daily without complaints of side effects. Last LDL 83 this time last year.  - continue atorvastatin 40 mg daily   See Encounters Tab for problem based charting.  Patient discussed with Dr. Evette Doffing

## 2017-03-31 NOTE — Patient Instructions (Addendum)
Jeremy [name]  Please STOP taking HCTZ and START taking chlorthalidone 50 mg daily  I am refilling your atorvastatin  Please focus on eating healthy and staying active ( walking is great! )   FOLLOW-UP INSTRUCTIONS When: 1-2 months  For: blood pressure check  What to bring: medication bottles, home blood pressure log   - Please call our clinic if you have any problems or questions, we may be able to help you and keep you from a long emergency room wait. Our clinic and after hours phone number is [phone]       Mediterranean Diet  Why follow it? Research shows. . Those who follow the Mediterranean diet have a reduced risk of heart disease  . The diet is associated with a reduced incidence of Parkinson's and Alzheimer's diseases . People following the diet may have longer life expectancies and lower rates of chronic diseases  . The Dietary Guidelines for Americans recommends the Mediterranean diet as an eating plan to promote health and prevent disease  What Is the Mediterranean Diet?  . Healthy eating plan based on typical foods and recipes of Mediterranean-style cooking . The diet is primarily a plant based diet; these foods should make up a majority of meals   Starches - Plant based foods should make up a majority of meals - They are an important sources of vitamins, minerals, energy, antioxidants, and fiber - Choose whole grains, foods high in fiber and minimally processed items  - Typical grain sources include wheat, oats, barley, corn, brown rice, bulgar, farro, millet, polenta, couscous  - Various types of beans include chickpeas, lentils, fava beans, black beans, white beans   Fruits  Veggies - Large quantities of antioxidant rich fruits & veggies; 6 or more servings  - Vegetables can be eaten raw or lightly drizzled with oil and cooked  - Vegetables common to the traditional Mediterranean Diet include: artichokes, arugula, beets, broccoli, brussel sprouts, cabbage, carrots,  celery, collard greens, cucumbers, eggplant, kale, leeks, lemons, lettuce, mushrooms, okra, onions, peas, peppers, potatoes, pumpkin, radishes, rutabaga, shallots, spinach, sweet potatoes, turnips, zucchini - Fruits common to the Mediterranean Diet include: apples, apricots, avocados, cherries, clementines, dates, figs, grapefruits, grapes, melons, nectarines, oranges, peaches, pears, pomegranates, strawberries, tangerines  Fats - Replace butter and margarine with healthy oils, such as olive oil, canola oil, and tahini  - Limit nuts to no more than a handful a day  - Nuts include walnuts, almonds, pecans, pistachios, pine nuts  - Limit or avoid candied, honey roasted or heavily salted nuts - Olives are central to the [employer] - can be eaten whole or used in a variety of dishes   Meats Protein - Limiting red meat: no more than a few times a month - When eating red meat: choose lean cuts and keep the portion to the size of deck of cards - Eggs: approx. 0 to 4 times a week  - Fish and lean poultry: at least 2 a week  - Healthy protein sources include, chicken, [place], lean beef, lamb - Increase intake of seafood such as tuna, salmon, trout, mackerel, shrimp, scallops - Avoid or limit high fat processed meats such as sausage and bacon  Dairy - Include moderate amounts of low fat dairy products  - Focus on healthy dairy such as fat free yogurt, skim milk, low or reduced fat cheese - Limit dairy products higher in fat such as whole or 2% milk, cheese, ice cream  Alcohol - Moderate amounts of red  wine is ok  - No more than 5 oz daily for women (all ages) and men older than age 32  - No more than 10 oz of wine daily for men younger than 15  Other - Limit sweets and other desserts  - Use herbs and spices instead of salt to flavor foods  - Herbs and spices common to the traditional Mediterranean Diet include: basil, bay leaves, chives, cloves, cumin, fennel, garlic, lavender, marjoram, mint,  oregano, parsley, pepper, rosemary, sage, savory, sumac, tarragon, thyme   It's not just a diet, it's a lifestyle:  . The Mediterranean diet includes lifestyle factors typical of those in the region  . Foods, drinks and meals are best eaten with others and savored . Daily physical activity is important for overall good health . This could be strenuous exercise like running and aerobics . This could also be more leisurely activities such as walking, housework, yard-work, or taking the stairs . Moderation is the key; a balanced and healthy diet accommodates most foods and drinks . Consider portion sizes and frequency of consumption of certain foods   Meal Ideas & Options:  . Breakfast:  o Whole wheat toast or whole wheat English muffins with peanut butter & hard boiled egg o Steel cut oats topped with apples & cinnamon and skim milk  o Fresh fruit: banana, strawberries, melon, berries, peaches  o Smoothies: strawberries, bananas, greek yogurt, peanut butter o Low fat greek yogurt with blueberries and granola  o Egg white omelet with spinach and mushrooms o Breakfast couscous: whole wheat couscous, apricots, skim milk, cranberries  . Sandwiches:  o Hummus and grilled vegetables (peppers, zucchini, squash) on whole wheat bread   o Grilled chicken on whole wheat pita with lettuce, tomatoes, cucumbers or tzatziki  o Tuna salad on whole wheat bread: tuna salad made with greek yogurt, olives, red peppers, capers, green onions o Garlic rosemary lamb pita: lamb sauted with garlic, rosemary, salt & pepper; add lettuce, cucumber, greek yogurt to pita - flavor with lemon juice and black pepper  . Seafood:  o Mediterranean grilled salmon, seasoned with garlic, basil, parsley, lemon juice and black pepper o Shrimp, lemon, and spinach whole-grain pasta salad made with low fat greek yogurt  o Seared scallops with lemon orzo  o Seared tuna steaks seasoned salt, pepper, coriander topped with tomato  mixture of olives, tomatoes, olive oil, minced garlic, parsley, green onions and cappers  . Meats:  o Herbed greek chicken salad with kalamata olives, cucumber, feta  o Red bell peppers stuffed with spinach, bulgur, lean ground beef (or lentils) & topped with feta   o Kebabs: skewers of chicken, tomatoes, onions, zucchini, squash  o [place] burgers: made with red onions, mint, dill, lemon juice, feta cheese topped with roasted red peppers . Vegetarian o Cucumber salad: cucumbers, artichoke hearts, celery, red onion, feta cheese, tossed in olive oil & lemon juice  o Hummus and whole grain pita points with a greek salad (lettuce, tomato, feta, olives, cucumbers, red onion) o Lentil soup with celery, carrots made with vegetable broth, garlic, salt and pepper  o Tabouli salad: parsley, bulgur, mint, scallions, cucumbers, tomato, radishes, lemon juice, olive oil, salt and pepper.

## 2017-04-01 LAB — BMP8+ANION GAP
Anion Gap: 17 mmol/L (ref 10.0–18.0)
BUN/Creatinine Ratio: 13 (ref 9–20)
BUN: 13 mg/dL (ref 6–24)
CO2: 26 mmol/L (ref 20–29)
Calcium: 10 mg/dL (ref 8.7–10.2)
Chloride: 101 mmol/L (ref 96–106)
Creatinine, Ser: 1 mg/dL (ref 0.76–1.27)
GFR calc Af Amer: 100 mL/min/{1.73_m2} (ref >59)
GFR calc non Af Amer: 87 mL/min/{1.73_m2} (ref >59)
Glucose: 103 mg/dL — ABNORMAL HIGH (ref 65–99)
Potassium: 4 mmol/L (ref 3.5–5.2)
Sodium: 144 mmol/L (ref 134–144)

## 2017-04-02 ENCOUNTER — Encounter: Payer: Self-pay | Admitting: Internal Medicine

## 2017-04-02 NOTE — Assessment & Plan Note (Signed)
History of hemorrhagic stroke. Taking atorvastatin daily without complaints of side effects. Last LDL 83 this time last year.  - continue atorvastatin 40 mg daily

## 2017-04-02 NOTE — Assessment & Plan Note (Signed)
Blood pressure is on the elevated side today. Patient reports good compliance with medications and monitors his blood pressure at home. He has his log with him today - BPs range 120-140/60-90, most readings are SBP 130s. Given history of stroke and current blood pressure goal recommendations, will change to a more potent thiazide diuretic. Last BMP was one year ago - normal kidney function and electrolytes.  -Continue home blood pressure readings  -start chlorthalidone 50 mg daily, discontinue HCTZ 25 mg daily   - continue metoprolol 50 mg BID  - BMP today

## 2017-04-02 NOTE — Assessment & Plan Note (Signed)
Vitamin D at the time of last office visit 27. Vitamin D was increased to 2000 IU daily with a goal to improve Vit D level to 30 - continue vitamin D 2000 IU daily

## 2017-04-02 NOTE — Assessment & Plan Note (Signed)
BMI 42 with multiple co morbidities including HTN and HLD. Weight increased 10 lbs from the time of last office visit.  - continue to encourage exercise and healthy eating, counseled on healthy food choices

## 2017-04-04 NOTE — Progress Notes (Signed)
Internal Medicine Clinic Attending  Case discussed with Dr. Blum at the time of the visit.  We reviewed the resident's history and exam and pertinent patient test results.  I agree with the assessment, diagnosis, and plan of care documented in the resident's note. 

## 2017-04-14 ENCOUNTER — Other Ambulatory Visit: Payer: Self-pay | Admitting: *Deleted

## 2017-04-14 MED ORDER — METOPROLOL TARTRATE 50 MG PO TABS: 50.0000 mg | ORAL_TABLET | Freq: Two times a day (BID) | ORAL | 3 refills | Status: DC

## 2017-04-15 ENCOUNTER — Other Ambulatory Visit: Payer: Self-pay | Admitting: Internal Medicine

## 2017-04-15 NOTE — Telephone Encounter (Signed)
Rx was refilled 03/31/17 x 1 RF - called pt, no answer; left message.

## 2017-04-15 NOTE — Telephone Encounter (Signed)
PATIENT IS REQUESTING REFILLS CHLORTHALIDONE 50 MG

## 2017-04-18 ENCOUNTER — Other Ambulatory Visit: Payer: Self-pay

## 2017-04-18 NOTE — Telephone Encounter (Signed)
Metoprolol rx was refilled on 2/28 - called pt ; no answer, left message to call the pharmacy.

## 2017-04-18 NOTE — Telephone Encounter (Signed)
metoprolol tartrate (LOPRESSOR) 50 MG tablet, REFILL REQUEST @ [facility] ON [address].

## 2017-04-18 NOTE — Telephone Encounter (Signed)
Pt stated he called [facility]; told they did not receive a rx . I called [facility] - first stated he has rx on hold. I asked if they rec the rx on 04/15/27 stated yes. Told her the pt had called and was told, waiting on [place]. She did not have a response except will "push this ahead" and get it ready for the pt. Called pt to inform of the above. Stated thank-you for calling.

## 2017-05-18 NOTE — Progress Notes (Signed)
  Subjective:  HPI: Jeremy [name] is a 52 y.o. male who presents for f/u HTN  Please see Assessment and Plan below for the status of his chronic medical problems.  Review of Systems: Review of Systems  Cardiovascular: Negative for chest pain.  Gastrointestinal: Negative for abdominal pain, constipation and diarrhea.  Genitourinary: Negative for dysuria and hematuria.  Skin: Negative for rash.  Neurological: Positive for tingling (in LUE and LLE chronic). Negative for dizziness and headaches.    Objective:  Physical Exam: Vitals:   05/19/17 1034  BP: (!) 153/92  Pulse: 68  Temp: 98 F (36.7 C)  TempSrc: Oral  SpO2: 92%  Weight: 282 lb 8 oz (128.1 kg)  Height: 5\' 9"  (1.753 m)   Physical Exam  Constitutional: He is well-developed, well-nourished, and in no distress.  In power wheelchair  HENT:  Dental caries/ broken teeth present  Cardiovascular: Normal rate and regular rhythm.  Pulmonary/Chest: Effort normal and breath sounds normal.  Abdominal: Bowel sounds are normal.  Musculoskeletal:  3+/5 LUE and LLE strength.  Nursing note and vitals reviewed.  Assessment & Plan:  Essential hypertension, benign HPI: no complaints, recentlty changed to 25 mg of chlorthalidone.  A: essential HTN  P:again above goal here, BP logs however show good control.  Given no AE, I will try to increase chlorthalidone to 50mg  daily.  Continue Metoprolol 50mg  BID  Spastic hemiparesis of left nondominant side due to nontraumatic intracerebral hemorrhage (HCC) -Continues to follow with Dr Ruta Hinds about 1x/year. -Continue baclofen for spasms.  Dental caries -Has changed insurance, wife asking for dental referral, his insurance will now cover.  Hyperlipidemia On atorvastatin 40mg  daily, recheck lipid panel.  Intertriginous dermatitis associated with moisture Previous rash cleared with nystatin powder, wife concerned with coming warm weather asking for Rx if needed.  I think this is  reasonable.  Nystatin Rx sent to pharmacy.  Morbid obesity with BMI of 40.0-44.9, adult ([place]) -Discussed importance of weight loss.   Medications Ordered Meds ordered this encounter  Medications  . chlorthalidone (HYGROTON) 50 MG tablet    Sig: Take 1 tablet (50 mg total) by mouth daily.    Dispense:  90 tablet    Refill:  3  . nystatin (NYSTATIN) powder    Sig: Apply topically 3 (three) times daily.    Dispense:  30 g    Refill:  2    Place on file   Other Orders Orders Placed This Encounter  Procedures  . BMP8+Anion Gap  . Lipid Profile  . Ambulatory referral to Dentistry    Referral Priority:   Routine    Referral Type:   Consultation    Referral Reason:   Specialty Services Required    Requested Specialty:   Dental General Practice    Number of Visits Requested:   1   Follow Up: Return in about 3 months (around 08/18/2017).

## 2017-05-19 ENCOUNTER — Ambulatory Visit (INDEPENDENT_AMBULATORY_CARE_PROVIDER_SITE_OTHER): Payer: Medicare HMO | Admitting: Internal Medicine

## 2017-05-19 ENCOUNTER — Encounter: Payer: Self-pay | Admitting: Internal Medicine

## 2017-05-19 ENCOUNTER — Other Ambulatory Visit: Payer: Self-pay

## 2017-05-19 VITALS — BP 153/92 | HR 68 | Temp 98.0°F | Ht 69.0 in | Wt 282.5 lb

## 2017-05-19 DIAGNOSIS — I10 Essential (primary) hypertension: Secondary | ICD-10-CM

## 2017-05-19 DIAGNOSIS — I69154 Hemiplegia and hemiparesis following nontraumatic intracerebral hemorrhage affecting left non-dominant side: Secondary | ICD-10-CM | POA: Diagnosis not present

## 2017-05-19 DIAGNOSIS — Z6841 Body Mass Index (BMI) 40.0 and over, adult: Secondary | ICD-10-CM | POA: Diagnosis not present

## 2017-05-19 DIAGNOSIS — L304 Erythema intertrigo: Secondary | ICD-10-CM

## 2017-05-19 DIAGNOSIS — K029 Dental caries, unspecified: Secondary | ICD-10-CM

## 2017-05-19 DIAGNOSIS — K0889 Other specified disorders of teeth and supporting structures: Secondary | ICD-10-CM

## 2017-05-19 DIAGNOSIS — I619 Nontraumatic intracerebral hemorrhage, unspecified: Secondary | ICD-10-CM

## 2017-05-19 DIAGNOSIS — E7800 Pure hypercholesterolemia, unspecified: Secondary | ICD-10-CM | POA: Diagnosis not present

## 2017-05-19 DIAGNOSIS — Z79899 Other long term (current) drug therapy: Secondary | ICD-10-CM

## 2017-05-19 DIAGNOSIS — E785 Hyperlipidemia, unspecified: Secondary | ICD-10-CM | POA: Diagnosis not present

## 2017-05-19 DIAGNOSIS — G8114 Spastic hemiplegia affecting left nondominant side: Secondary | ICD-10-CM

## 2017-05-19 DIAGNOSIS — Z993 Dependence on wheelchair: Secondary | ICD-10-CM | POA: Diagnosis not present

## 2017-05-19 MED ORDER — NYSTATIN 100000 UNIT/GM EX POWD: Freq: Three times a day (TID) | CUTANEOUS | 2 refills | Status: DC

## 2017-05-19 MED ORDER — CHLORTHALIDONE 50 MG PO TABS: 50.0000 mg | ORAL_TABLET | Freq: Every day | ORAL | 3 refills | Status: DC

## 2017-05-19 NOTE — Assessment & Plan Note (Signed)
Previous rash cleared with nystatin powder, wife concerned with coming warm weather asking for Rx if needed.  I think this is reasonable.  Nystatin Rx sent to pharmacy.

## 2017-05-19 NOTE — Assessment & Plan Note (Signed)
-  Continues to follow with Dr Ruta Hinds about 1x/year. -Continue baclofen for spasms.

## 2017-05-19 NOTE — Patient Instructions (Signed)
I will call with the lab results.

## 2017-05-19 NOTE — Assessment & Plan Note (Signed)
HPI: no complaints, recentlty changed to 25 mg of chlorthalidone.  A: essential HTN  P:again above goal here, BP logs however show good control.  Given no AE, I will try to increase chlorthalidone to 50mg  daily.  Continue Metoprolol 50mg  BID

## 2017-05-19 NOTE — Assessment & Plan Note (Signed)
Discussed importance of weight loss. 

## 2017-05-19 NOTE — Assessment & Plan Note (Signed)
-  Has changed insurance, wife asking for dental referral, his insurance will now cover.

## 2017-05-19 NOTE — Assessment & Plan Note (Addendum)
On atorvastatin 40mg  daily, recheck lipid panel.  ADDENDUM: LDL above goal will increase to 80mg  daily

## 2017-05-20 LAB — BMP8+ANION GAP
Anion Gap: 14 mmol/L (ref 10.0–18.0)
BUN/Creatinine Ratio: 16 (ref 9–20)
BUN: 17 mg/dL (ref 6–24)
CO2: 29 mmol/L (ref 20–29)
Calcium: 9.7 mg/dL (ref 8.7–10.2)
Chloride: 98 mmol/L (ref 96–106)
Creatinine, Ser: 1.05 mg/dL (ref 0.76–1.27)
GFR calc Af Amer: 95 mL/min/{1.73_m2} (ref >59)
GFR calc non Af Amer: 82 mL/min/{1.73_m2} (ref >59)
Glucose: 92 mg/dL (ref 65–99)
Potassium: 3.6 mmol/L (ref 3.5–5.2)
Sodium: 141 mmol/L (ref 134–144)

## 2017-05-20 LAB — LIPID PANEL
Chol/HDL Ratio: 5 ratio (ref 0.0–5.0)
Cholesterol, Total: 166 mg/dL (ref 100–199)
HDL: 33 mg/dL — ABNORMAL LOW (ref >39)
LDL Calculated: 109 mg/dL — ABNORMAL HIGH (ref 0–99)
Triglycerides: 118 mg/dL (ref 0–149)
VLDL Cholesterol Cal: 24 mg/dL (ref 5–40)

## 2017-05-20 MED ORDER — ATORVASTATIN CALCIUM 80 MG PO TABS: 80.0000 mg | ORAL_TABLET | Freq: Every day | ORAL | 3 refills | Status: DC

## 2017-05-20 NOTE — Addendum Note (Signed)
Addended by: Lucious Groves on: 05/20/2017 09:46 AM   Modules accepted: Orders

## 2017-08-04 DIAGNOSIS — Z7982 Long term (current) use of aspirin: Secondary | ICD-10-CM | POA: Diagnosis not present

## 2017-08-04 DIAGNOSIS — H6123 Impacted cerumen, bilateral: Secondary | ICD-10-CM | POA: Diagnosis not present

## 2017-08-04 DIAGNOSIS — Z6841 Body Mass Index (BMI) 40.0 and over, adult: Secondary | ICD-10-CM | POA: Diagnosis not present

## 2017-08-04 DIAGNOSIS — K5900 Constipation, unspecified: Secondary | ICD-10-CM | POA: Diagnosis not present

## 2017-08-04 DIAGNOSIS — I69354 Hemiplegia and hemiparesis following cerebral infarction affecting left non-dominant side: Secondary | ICD-10-CM | POA: Diagnosis not present

## 2017-08-04 DIAGNOSIS — I10 Essential (primary) hypertension: Secondary | ICD-10-CM | POA: Diagnosis not present

## 2017-08-04 DIAGNOSIS — E785 Hyperlipidemia, unspecified: Secondary | ICD-10-CM | POA: Diagnosis not present

## 2017-08-04 DIAGNOSIS — Z8249 Family history of ischemic heart disease and other diseases of the circulatory system: Secondary | ICD-10-CM | POA: Diagnosis not present

## 2017-08-04 DIAGNOSIS — G8929 Other chronic pain: Secondary | ICD-10-CM | POA: Diagnosis not present

## 2017-09-29 ENCOUNTER — Encounter: Payer: Self-pay | Admitting: Internal Medicine

## 2017-09-29 ENCOUNTER — Ambulatory Visit (INDEPENDENT_AMBULATORY_CARE_PROVIDER_SITE_OTHER): Payer: Medicare HMO | Admitting: Internal Medicine

## 2017-09-29 ENCOUNTER — Other Ambulatory Visit: Payer: Self-pay

## 2017-09-29 VITALS — BP 117/75 | HR 70 | Temp 98.3°F | Ht 69.0 in | Wt 263.4 lb

## 2017-09-29 DIAGNOSIS — E785 Hyperlipidemia, unspecified: Secondary | ICD-10-CM

## 2017-09-29 DIAGNOSIS — Z79899 Other long term (current) drug therapy: Secondary | ICD-10-CM | POA: Diagnosis not present

## 2017-09-29 DIAGNOSIS — I69254 Hemiplegia and hemiparesis following other nontraumatic intracranial hemorrhage affecting left non-dominant side: Secondary | ICD-10-CM

## 2017-09-29 DIAGNOSIS — E7800 Pure hypercholesterolemia, unspecified: Secondary | ICD-10-CM

## 2017-09-29 DIAGNOSIS — E559 Vitamin D deficiency, unspecified: Secondary | ICD-10-CM | POA: Diagnosis not present

## 2017-09-29 DIAGNOSIS — I10 Essential (primary) hypertension: Secondary | ICD-10-CM | POA: Diagnosis not present

## 2017-09-29 DIAGNOSIS — Z993 Dependence on wheelchair: Secondary | ICD-10-CM | POA: Diagnosis not present

## 2017-09-29 DIAGNOSIS — G8114 Spastic hemiplegia affecting left nondominant side: Secondary | ICD-10-CM

## 2017-09-29 DIAGNOSIS — I619 Nontraumatic intracerebral hemorrhage, unspecified: Secondary | ICD-10-CM

## 2017-09-29 MED ORDER — BACLOFEN 10 MG PO TABS: 10.0000 mg | ORAL_TABLET | Freq: Two times a day (BID) | ORAL | 3 refills | Status: DC | PRN

## 2017-09-29 MED ORDER — VITAMIN D3 25 MCG (1000 UNIT) PO TABS: 2000.0000 [IU] | ORAL_TABLET | Freq: Every day | ORAL | Status: DC

## 2017-09-29 NOTE — Patient Instructions (Signed)
Great job on your blood pressure, keep up the good work.

## 2017-09-30 LAB — BMP8+ANION GAP
Anion Gap: 17 mmol/L (ref 10.0–18.0)
BUN/Creatinine Ratio: 19 (ref 9–20)
BUN: 21 mg/dL (ref 6–24)
CO2: 28 mmol/L (ref 20–29)
Calcium: 9.7 mg/dL (ref 8.7–10.2)
Chloride: 102 mmol/L (ref 96–106)
Creatinine, Ser: 1.09 mg/dL (ref 0.76–1.27)
GFR calc Af Amer: 90 mL/min/{1.73_m2} (ref >59)
GFR calc non Af Amer: 78 mL/min/{1.73_m2} (ref >59)
Glucose: 103 mg/dL — ABNORMAL HIGH (ref 65–99)
Potassium: 4.1 mmol/L (ref 3.5–5.2)
Sodium: 147 mmol/L — ABNORMAL HIGH (ref 134–144)

## 2017-09-30 LAB — LIPID PANEL
Chol/HDL Ratio: 5.7 ratio — ABNORMAL HIGH (ref 0.0–5.0)
Cholesterol, Total: 176 mg/dL (ref 100–199)
HDL: 31 mg/dL — ABNORMAL LOW (ref >39)
LDL Calculated: 109 mg/dL — ABNORMAL HIGH (ref 0–99)
Triglycerides: 180 mg/dL — ABNORMAL HIGH (ref 0–149)
VLDL Cholesterol Cal: 36 mg/dL (ref 5–40)

## 2017-10-02 NOTE — Assessment & Plan Note (Signed)
HPI: no complaints, bring BP logs well controlled  A: essential HTN at goal  P:Continue chlorthalidone to 50mg  daily.  Continue Metoprolol 50mg  BID

## 2017-10-02 NOTE — Progress Notes (Signed)
  Subjective:  HPI: [name] is a 52 y.o. male who presents for f/u HTN  Please see Assessment and Plan below for the status of his chronic medical problems.  Review of Systems: Review of Systems  Cardiovascular: Negative for chest pain.  Gastrointestinal: Negative for abdominal pain, constipation and diarrhea.  Genitourinary: Negative for dysuria and hematuria.  Skin: Negative for rash.  Neurological: Positive for tingling (in LUE and LLE chronic). Negative for dizziness and headaches.    Objective:  Physical Exam: Vitals:   09/29/17 1535  BP: 117/75  Pulse: 70  Temp: 98.3 F (36.8 C)  TempSrc: Oral  SpO2: 93%  Weight: 263 lb 6.4 oz (119.5 kg)  Height: 5\' 9"  (1.753 m)   Physical Exam  Constitutional: He is well-developed, well-nourished, and in no distress.  In power wheelchair  Cardiovascular: Normal rate and regular rhythm.  Pulmonary/Chest: Effort normal and breath sounds normal.  Abdominal: Bowel sounds are normal.  Nursing note and vitals reviewed.  Assessment & Plan:  Essential hypertension, benign HPI: no complaints, bring BP logs well controlled  A: essential HTN at goal  P:Continue chlorthalidone to 50mg  daily.  Continue Metoprolol 50mg  BID  Spastic hemiparesis of left nondominant side due to nontraumatic intracerebral hemorrhage (HCC) HPI: Continues to work on stretching and exercises.  No complaints, wants refill of baclofen  A: Spastic hemiparesis  P:Cotninue baclofen.  Hyperlipidemia -Repeat Lipid panel.  Vitamin D deficiency Taking 2000 IU of vitamin D3 daily.   Medications Ordered Meds ordered this encounter  Medications  . baclofen (LIORESAL) 10 MG tablet    Sig: Take 1 tablet (10 mg total) by mouth 2 (two) times daily as needed for muscle spasms.    Dispense:  60 tablet    Refill:  3  . cholecalciferol (VITAMIN D) 1000 units tablet    Sig: Take 2 tablets (2,000 Units total) by mouth daily.   Other Orders Orders Placed This  Encounter  Procedures  . Lipid Profile  . BMP8+Anion Gap   Follow Up: Return 5-6 months with dr Heber [place].

## 2017-10-02 NOTE — Assessment & Plan Note (Signed)
HPI: Continues to work on stretching and exercises.  No complaints, wants refill of baclofen  A: Spastic hemiparesis  P:Cotninue baclofen.

## 2017-10-02 NOTE — Assessment & Plan Note (Signed)
Taking 2000 IU of vitamin D3 daily.

## 2017-10-02 NOTE — Assessment & Plan Note (Signed)
-  Repeat Lipid panel

## 2017-10-10 ENCOUNTER — Telehealth: Payer: Self-pay

## 2017-10-10 NOTE — Telephone Encounter (Signed)
Left message on VM requesting return call. L. Vahan Wadsworth, RN, BSN   

## 2017-10-10 NOTE — Telephone Encounter (Signed)
Pt want to know, will it be okay if he starts at the [facility].

## 2017-10-10 NOTE — Telephone Encounter (Signed)
I think that would be ok. Thank you

## 2017-10-10 NOTE — Telephone Encounter (Signed)
Patient returned call. [facility] for [facility]CP. Patient appreciative. Hubbard Hartshorn, RN, BSN

## 2017-10-31 ENCOUNTER — Encounter: Payer: Self-pay | Admitting: Physical Medicine & Rehabilitation

## 2017-10-31 ENCOUNTER — Encounter: Payer: Medicare HMO | Attending: Physical Medicine & Rehabilitation | Admitting: Physical Medicine & Rehabilitation

## 2017-10-31 VITALS — BP 113/77 | HR 75 | Ht 70.0 in | Wt 260.0 lb

## 2017-10-31 DIAGNOSIS — Z8249 Family history of ischemic heart disease and other diseases of the circulatory system: Secondary | ICD-10-CM | POA: Diagnosis not present

## 2017-10-31 DIAGNOSIS — Z09 Encounter for follow-up examination after completed treatment for conditions other than malignant neoplasm: Secondary | ICD-10-CM | POA: Diagnosis not present

## 2017-10-31 DIAGNOSIS — I618 Other nontraumatic intracerebral hemorrhage: Secondary | ICD-10-CM | POA: Diagnosis not present

## 2017-10-31 DIAGNOSIS — I10 Essential (primary) hypertension: Secondary | ICD-10-CM | POA: Insufficient documentation

## 2017-10-31 DIAGNOSIS — E7800 Pure hypercholesterolemia, unspecified: Secondary | ICD-10-CM | POA: Insufficient documentation

## 2017-10-31 DIAGNOSIS — G5602 Carpal tunnel syndrome, left upper limb: Secondary | ICD-10-CM | POA: Insufficient documentation

## 2017-10-31 DIAGNOSIS — Z8673 Personal history of transient ischemic attack (TIA), and cerebral infarction without residual deficits: Secondary | ICD-10-CM | POA: Insufficient documentation

## 2017-10-31 DIAGNOSIS — G8114 Spastic hemiplegia affecting left nondominant side: Secondary | ICD-10-CM

## 2017-10-31 DIAGNOSIS — K029 Dental caries, unspecified: Secondary | ICD-10-CM | POA: Diagnosis not present

## 2017-10-31 NOTE — Patient Instructions (Addendum)
   Wear at night at least! If symptoms worsen, let me know.   TEETH!!!!

## 2017-10-31 NOTE — Progress Notes (Signed)
Subjective:    Patient ID: Jeremy [name], male    DOB: 1965/12/20, 52 y.o.   MRN: [id number]  HPI   Jeremy [name] is here in follow up of his right BG/thalamic hemorrhage. He is working out regularly at [employer] and is able to walk at home with his RW. He can go about 20 minutes at a time. He uses his power chair for outside the home but is primarliy on his walker at home. He still has some sensory loss and weakness in the left arm and leg but he denies pain on that side. He sleeps well. Mood is good. Bowel and bladder are functional.  He states that his wheelchair remains functional.  He was wearing triple gloves on his left hand today.  I asked him why and he said it was because his hand was cold.  He is having some pain along the thumb index and middle fingers as well.  Pain is often worse at night.  Pain Inventory Average Pain 0 Pain Right Now 0 My pain is na  In the last 24 hours, has pain interfered with the following? General activity na Relation with others na Enjoyment of life na What TIME of day is your pain at its worst? na Sleep (in general) Fair  Pain is worse with: na Pain improves with: na Relief from Meds: na  Mobility ability to climb steps?  no do you drive?  no  Function disabled: date disabled .  Neuro/Psych No problems in this area  Prior Studies Any changes since last visit?  no  Physicians involved in your care Any changes since last visit?  no   Family History  Problem Relation Age of Onset  . Hypertension Mother   . Heart disease Father   . Gout Father   . Hypertension Father    Social History   Socioeconomic History  . Marital status: Married    Spouse name: Not on file  . Number of children: 0  . Years of education: 12th  . Highest education level: Not on file  Occupational History  . Occupation: retired    Fish farm manager: [employer]  . Financial resource strain: Not on file  . Food insecurity:    Worry: Not  on file    Inability: Not on file  . Transportation needs:    Medical: Not on file    Non-medical: Not on file  Tobacco Use  . Smoking status: Never Smoker  . Smokeless tobacco: Never Used  Substance and Sexual Activity  . Alcohol use: No    Alcohol/week: 0.0 standard drinks  . Drug use: No  . Sexual activity: Not on file  Lifestyle  . Physical activity:    Days per week: Not on file    Minutes per session: Not on file  . Stress: Not on file  Relationships  . Social connections:    Talks on phone: Not on file    Gets together: Not on file    Attends religious service: Not on file    Active member of club or organization: Not on file    Attends meetings of clubs or organizations: Not on file    Relationship status: Not on file  Other Topics Concern  . Not on file  Social History Narrative   Patient lives with his wife..and drinks sodas.   Past Surgical History:  Procedure Laterality Date  . TRACHEOSTOMY TUBE PLACEMENT  10/11/2011   Procedure: TRACHEOSTOMY;  Surgeon: Orpah Greek  Redmond Baseman, MD;  Location: [facility];  Service: ENT;  Laterality: N/A;   Past Medical History:  Diagnosis Date  . High cholesterol   . Hypertension 10/01/2011   basal ganglia  . ICH (intracerebral hemorrhage) ([place]) 10/01/2011   ICH in 09/2011.    . Stroke ([place])    There were no vitals taken for this visit.  Opioid Risk Score:   Fall Risk Score:  `1  Depression screen PHQ 2/9  Depression screen [facility] 2/9 09/29/2017 05/19/2017 03/31/2017 09/23/2016 05/13/2016 11/05/2015 05/06/2015  Decreased Interest 0 0 0 0 0 0 2  Down, Depressed, Hopeless 0 0 0 0 0 0 0  PHQ - 2 Score 0 0 0 0 0 0 2  Altered sleeping - - - - - - 1  Tired, decreased energy - - - - - - 1  Change in appetite - - - - - - 1  Feeling bad or failure about yourself  - - - - - - 1  Trouble concentrating - - - - - - 1  Moving slowly or fidgety/restless - - - - - - 2  Suicidal thoughts - - - - - - 0  PHQ-9 Score - - - - - - 9  Difficult doing work/chores  - - - - - - Not difficult at all     Review of Systems  Constitutional: Negative.   HENT: Negative.   Eyes: Negative.   Respiratory: Negative.   Cardiovascular: Negative.   Gastrointestinal: Negative.   Endocrine: Negative.   Genitourinary: Negative.   Musculoskeletal: Negative.   Skin: Negative.   Allergic/Immunologic: Negative.   Neurological: Negative.   Hematological: Negative.   Psychiatric/Behavioral: Negative.   All other systems reviewed and are negative.      Objective:   Physical Exam  General: No acute distress, chair is full of baby powder HEENT: EOMI, oral membranes moist. Poor dentition Cards: reg rate  Chest: normal effort Abdomen: Soft, NT, ND Skin: dry, intact Extremities: no edema Neuro:Pt is cognitively appropriate with normal insight, memory, and awareness.speech volume improved, minimal dysarthria. Cranial nerve: left central 7.  Reflexes are 3+ on the left. No resting tone. 5/5 motor on the right. LUE and LLE 5/5. Sensory 1/2 LUE and LLE Musculoskeletal: + Tinel's at left wrist.  Psych:pleasant and appropriate   Assessment & Plan:  ASSESSMENT: 1. Right BG/thalamic hemorrhage- he is making gradual progress  2. HTN  3. Dysphagia (resolved) .  4. Spastic left hemiparesis with intermittent spasms  5. Dental caries  6.  Left carpal tunnel syndrome.  Appears mild and superimposed upon his left sided sensory deficits from his ICH  Plan:  1. Continue baclofen for spasms 2. Reiterated need for dental work before he has a complication 3. Tylenol for pain. Can use more liberally 4. Provided instructions re: left CTS splint. Follow up with me if symptoms worsen 5. Continue HEP. hes' doing a nice job with this. 6. Follow up here in about 54months. 15 minutes of face to face patient care time were spent during this visit. All questions were encouraged and answered.

## 2017-10-31 NOTE — Addendum Note (Signed)
Addended by: Caro Hight on: 10/31/2017 11:17 AM   Modules accepted: Orders

## 2018-03-17 ENCOUNTER — Other Ambulatory Visit: Payer: Self-pay | Admitting: Internal Medicine

## 2018-03-17 MED ORDER — ATORVASTATIN CALCIUM 40 MG PO TABS: 40.0000 mg | ORAL_TABLET | Freq: Every day | ORAL | 3 refills | Status: DC

## 2018-03-17 NOTE — Telephone Encounter (Signed)
Needs refills atorvastatin (LIPITOR) 40 MG tabletatorvastatin (LIPITOR) 40 MG tablet  [facility] ([facility] [place] [facility] [place] ;pt contact [phone]

## 2018-03-24 DIAGNOSIS — E785 Hyperlipidemia, unspecified: Secondary | ICD-10-CM | POA: Diagnosis not present

## 2018-03-24 DIAGNOSIS — M62838 Other muscle spasm: Secondary | ICD-10-CM | POA: Diagnosis not present

## 2018-03-24 DIAGNOSIS — R69 Illness, unspecified: Secondary | ICD-10-CM | POA: Diagnosis not present

## 2018-03-24 DIAGNOSIS — I69354 Hemiplegia and hemiparesis following cerebral infarction affecting left non-dominant side: Secondary | ICD-10-CM | POA: Diagnosis not present

## 2018-03-24 DIAGNOSIS — E669 Obesity, unspecified: Secondary | ICD-10-CM | POA: Diagnosis not present

## 2018-03-24 DIAGNOSIS — Z8249 Family history of ischemic heart disease and other diseases of the circulatory system: Secondary | ICD-10-CM | POA: Diagnosis not present

## 2018-03-24 DIAGNOSIS — I10 Essential (primary) hypertension: Secondary | ICD-10-CM | POA: Diagnosis not present

## 2018-03-24 DIAGNOSIS — Z7982 Long term (current) use of aspirin: Secondary | ICD-10-CM | POA: Diagnosis not present

## 2018-03-24 DIAGNOSIS — G8929 Other chronic pain: Secondary | ICD-10-CM | POA: Diagnosis not present

## 2018-03-24 DIAGNOSIS — M2550 Pain in unspecified joint: Secondary | ICD-10-CM | POA: Diagnosis not present

## 2018-04-13 ENCOUNTER — Encounter: Payer: Self-pay | Admitting: Internal Medicine

## 2018-04-13 ENCOUNTER — Ambulatory Visit (INDEPENDENT_AMBULATORY_CARE_PROVIDER_SITE_OTHER): Payer: Medicare HMO | Admitting: Internal Medicine

## 2018-04-13 ENCOUNTER — Other Ambulatory Visit: Payer: Self-pay

## 2018-04-13 VITALS — BP 135/81 | HR 92 | Temp 98.2°F | Ht 70.0 in | Wt 282.5 lb

## 2018-04-13 DIAGNOSIS — I619 Nontraumatic intracerebral hemorrhage, unspecified: Secondary | ICD-10-CM | POA: Diagnosis not present

## 2018-04-13 DIAGNOSIS — Z79899 Other long term (current) drug therapy: Secondary | ICD-10-CM

## 2018-04-13 DIAGNOSIS — I10 Essential (primary) hypertension: Secondary | ICD-10-CM

## 2018-04-13 DIAGNOSIS — K0889 Other specified disorders of teeth and supporting structures: Secondary | ICD-10-CM | POA: Diagnosis not present

## 2018-04-13 DIAGNOSIS — K029 Dental caries, unspecified: Secondary | ICD-10-CM

## 2018-04-13 DIAGNOSIS — Z993 Dependence on wheelchair: Secondary | ICD-10-CM | POA: Diagnosis not present

## 2018-04-13 DIAGNOSIS — G8114 Spastic hemiplegia affecting left nondominant side: Secondary | ICD-10-CM | POA: Diagnosis not present

## 2018-04-13 DIAGNOSIS — Z6841 Body Mass Index (BMI) 40.0 and over, adult: Secondary | ICD-10-CM | POA: Diagnosis not present

## 2018-04-13 MED ORDER — BACLOFEN 10 MG PO TABS: 10.0000 mg | ORAL_TABLET | Freq: Two times a day (BID) | ORAL | 3 refills | Status: DC | PRN

## 2018-04-13 MED ORDER — METOPROLOL TARTRATE 50 MG PO TABS: 50.0000 mg | ORAL_TABLET | Freq: Two times a day (BID) | ORAL | 3 refills | Status: DC

## 2018-04-13 MED ORDER — CHLORTHALIDONE 50 MG PO TABS: 50.0000 mg | ORAL_TABLET | Freq: Every day | ORAL | 3 refills | Status: DC

## 2018-04-13 NOTE — Assessment & Plan Note (Signed)
HPI: Under reports that she is working to get him in to see a dentist for tooth extraction main barrier is cost.  She is unsure how much the insurance will help if any.  They have a dental insurance card and plan to schedule an appointment within the next few months.  Assessment dental caries  Plan Discussed importance of dental health to overall health

## 2018-04-13 NOTE — Assessment & Plan Note (Signed)
HPI: Patient is accompanied by his wife [name] today.  He has no complaints he has been checking his blood pressure daily he brings a log most of the readings are in a systolic of 628 range with almost no readings above systolic of 315.  He reports no orthostatic symptoms and feels he is overall doing very well.  Assessment essential hypertension well-controlled  Plan Continue chlorthalidone 50 mg daily and metoprolol tartrate 50 mg twice daily.  Follow Up in 6 months.

## 2018-04-13 NOTE — Assessment & Plan Note (Signed)
HPI: Continues to use the power wheelchair for mobility.  He saw PMNR a few months ago overall plan is to continue baclofen he will follow with him on a yearly basis unless more pressing concerns arise.  He continues to take baclofen 1-2 times a day for spasms.  Assessment spastic hemiparesis of left side  Plan Continue baclofen.  Under reports to me that she has had an injury herself and she has had some more trouble taking care of [place] ADLs.  She is wondering if he will qualify for personal care services.  I will request PCS to come out and evaluate him.

## 2018-04-13 NOTE — Assessment & Plan Note (Signed)
Weight has increased some we discussed importance of weight loss.  Discussed calorie restriction and cutting out junk foods.

## 2018-04-13 NOTE — Progress Notes (Signed)
Subjective:  HPI: [name] is a 53 y.o. male who presents for f/u HTN  Please see Assessment and Plan below for the status of his chronic medical problems.  Review of Systems: Review of Systems  Cardiovascular: Negative for chest pain.  Gastrointestinal: Negative for abdominal pain, constipation and diarrhea.  Genitourinary: Negative for dysuria and hematuria.  Skin: Negative for rash.  Neurological: Positive for tingling (in LUE and LLE chronic). Negative for dizziness and headaches.    Objective:  Physical Exam: Vitals:   04/13/18 1044  BP: 135/81  Pulse: 92  Temp: 98.2 F (36.8 C)  TempSrc: Oral  SpO2: 97%  Weight: 282 lb 8 oz (128.1 kg)  Height: 5\' 10"  (1.778 m)   Physical Exam  Constitutional: He is well-developed, well-nourished, and in no distress.  In power wheelchair  HENT:  Poor dentition   Cardiovascular: Normal rate and regular rhythm.  Pulmonary/Chest: Effort normal and breath sounds normal.  Abdominal: Bowel sounds are normal.  Nursing note and vitals reviewed.  Assessment & Plan:  Essential hypertension, benign HPI: Patient is accompanied by his wife Jeremy [name] today.  He has no complaints he has been checking his blood pressure daily he brings a log most of the readings are in a systolic of 035 range with almost no readings above systolic of 009.  He reports no orthostatic symptoms and feels he is overall doing very well.  Assessment essential hypertension well-controlled  Plan Continue chlorthalidone 50 mg daily and metoprolol tartrate 50 mg twice daily.  Follow Up in 6 months.  Spastic hemiparesis of left nondominant side due to nontraumatic intracerebral hemorrhage (HCC) HPI: Continues to use the power wheelchair for mobility.  He saw PMNR a few months ago overall plan is to continue baclofen he will follow with him on a yearly basis unless more pressing concerns arise.  He continues to take baclofen 1-2 times a day for spasms.  Assessment  spastic hemiparesis of left side  Plan Continue baclofen.  Under reports to me that she has had an injury herself and she has had some more trouble taking care of Jeremy [name] ADLs.  She is wondering if he will qualify for personal care services.  I will request PCS to come out and evaluate him.  Dental caries HPI: Under reports that she is working to get him in to see a dentist for tooth extraction main barrier is cost.  She is unsure how much the insurance will help if any.  They have a dental insurance card and plan to schedule an appointment within the next few months.  Assessment dental caries  Plan Discussed importance of dental health to overall health  Morbid obesity with BMI of 40.0-44.9, adult (HCC) Weight has increased some we discussed importance of weight loss.  Discussed calorie restriction and cutting out junk foods.   Medications Ordered Meds ordered this encounter  Medications  . chlorthalidone (HYGROTON) 50 MG tablet    Sig: Take 1 tablet (50 mg total) by mouth daily.    Dispense:  90 tablet    Refill:  3    Place on file  . metoprolol tartrate (LOPRESSOR) 50 MG tablet    Sig: Take 1 tablet (50 mg total) by mouth 2 (two) times daily.    Dispense:  180 tablet    Refill:  3    Place on file  . baclofen (LIORESAL) 10 MG tablet    Sig: Take 1 tablet (10 mg total) by mouth 2 (two) times daily as  needed for muscle spasms.    Dispense:  180 tablet    Refill:  3    Place on file   Other Orders No orders of the defined types were placed in this encounter.  Follow Up: Return in about 6 months (around 10/12/2018).

## 2018-06-14 ENCOUNTER — Other Ambulatory Visit: Payer: Self-pay

## 2018-06-14 NOTE — Telephone Encounter (Signed)
chlorthalidone (HYGROTON) 50 MG tablet   Refill request @  [facility] ([address] [place] [phone] (Phone) [phone] (Fax)

## 2018-06-14 NOTE — Telephone Encounter (Signed)
Pt has refills on hygroton. Called pt - informed to call [facility] pharmacy;stated ok.

## 2018-08-24 ENCOUNTER — Ambulatory Visit (INDEPENDENT_AMBULATORY_CARE_PROVIDER_SITE_OTHER): Payer: BC Managed Care – PPO | Admitting: Internal Medicine

## 2018-08-24 ENCOUNTER — Encounter: Payer: Self-pay | Admitting: Internal Medicine

## 2018-08-24 ENCOUNTER — Other Ambulatory Visit: Payer: Self-pay

## 2018-08-24 VITALS — BP 112/76 | HR 66 | Temp 98.2°F | Wt 283.0 lb

## 2018-08-24 DIAGNOSIS — E7800 Pure hypercholesterolemia, unspecified: Secondary | ICD-10-CM | POA: Diagnosis not present

## 2018-08-24 DIAGNOSIS — R21 Rash and other nonspecific skin eruption: Secondary | ICD-10-CM | POA: Diagnosis not present

## 2018-08-24 DIAGNOSIS — E785 Hyperlipidemia, unspecified: Secondary | ICD-10-CM | POA: Diagnosis not present

## 2018-08-24 DIAGNOSIS — R7302 Impaired glucose tolerance (oral): Secondary | ICD-10-CM | POA: Diagnosis not present

## 2018-08-24 DIAGNOSIS — Z79899 Other long term (current) drug therapy: Secondary | ICD-10-CM | POA: Diagnosis not present

## 2018-08-24 DIAGNOSIS — I10 Essential (primary) hypertension: Secondary | ICD-10-CM

## 2018-08-24 DIAGNOSIS — Z6841 Body Mass Index (BMI) 40.0 and over, adult: Secondary | ICD-10-CM

## 2018-08-24 MED ORDER — TRIAMCINOLONE ACETONIDE 0.1 % EX CREA: 1.0000 "application " | TOPICAL_CREAM | Freq: Two times a day (BID) | CUTANEOUS | 3 refills | Status: DC

## 2018-08-25 LAB — BMP8+ANION GAP
Anion Gap: 15 mmol/L (ref 10.0–18.0)
BUN/Creatinine Ratio: 13 (ref 9–20)
BUN: 14 mg/dL (ref 6–24)
CO2: 27 mmol/L (ref 20–29)
Calcium: 9.8 mg/dL (ref 8.7–10.2)
Chloride: 98 mmol/L (ref 96–106)
Creatinine, Ser: 1.04 mg/dL (ref 0.76–1.27)
GFR calc Af Amer: 95 mL/min/{1.73_m2} (ref >59)
GFR calc non Af Amer: 82 mL/min/{1.73_m2} (ref >59)
Glucose: 136 mg/dL — ABNORMAL HIGH (ref 65–99)
Potassium: 3.8 mmol/L (ref 3.5–5.2)
Sodium: 140 mmol/L (ref 134–144)

## 2018-08-25 LAB — CBC WITH DIFFERENTIAL/PLATELET
Basophils Absolute: 0.1 10*3/uL (ref 0.0–0.2)
Basos: 1 %
EOS (ABSOLUTE): 0.3 10*3/uL (ref 0.0–0.4)
Eos: 3 %
Hematocrit: 38.7 % (ref 37.5–51.0)
Hemoglobin: 13.4 g/dL (ref 13.0–17.7)
Immature Grans (Abs): 0 10*3/uL (ref 0.0–0.1)
Immature Granulocytes: 0 %
Lymphocytes Absolute: 2.4 10*3/uL (ref 0.7–3.1)
Lymphs: 30 %
MCH: 30.5 pg (ref 26.6–33.0)
MCHC: 34.6 g/dL (ref 31.5–35.7)
MCV: 88 fL (ref 79–97)
Monocytes Absolute: 0.7 10*3/uL (ref 0.1–0.9)
Monocytes: 9 %
Neutrophils Absolute: 4.6 10*3/uL (ref 1.4–7.0)
Neutrophils: 57 %
Platelets: 286 10*3/uL (ref 150–450)
RBC: 4.4 x10E6/uL (ref 4.14–5.80)
RDW: 12.6 % (ref 11.6–15.4)
WBC: 7.9 10*3/uL (ref 3.4–10.8)

## 2018-08-25 LAB — LIPID PANEL
Chol/HDL Ratio: 5.3 ratio — ABNORMAL HIGH (ref 0.0–5.0)
Cholesterol, Total: 163 mg/dL (ref 100–199)
HDL: 31 mg/dL — ABNORMAL LOW (ref >39)
LDL Calculated: 98 mg/dL (ref 0–99)
Triglycerides: 168 mg/dL — ABNORMAL HIGH (ref 0–149)
VLDL Cholesterol Cal: 34 mg/dL (ref 5–40)

## 2018-08-25 LAB — HEMOGLOBIN A1C
Est. average glucose Bld gHb Est-mCnc: 157 mg/dL
Hgb A1c MFr Bld: 7.1 % — ABNORMAL HIGH (ref 4.8–5.6)

## 2018-08-29 DIAGNOSIS — R21 Rash and other nonspecific skin eruption: Secondary | ICD-10-CM | POA: Insufficient documentation

## 2018-08-29 NOTE — Assessment & Plan Note (Signed)
HPI: C/O rash over extensor surfaces of bilateral arms. No history of excema, has had intergrignous rash in groin but feels this is different. Not much itching, really doesn't bother him much but wanted to bring it up, no sure when it started, no changes in soaps, or other environmental products.  A: Extensor surface rash bilateral arms  P: Not exactly sure if this is ezcema however that is my most likely explanation, I will have him try kenalog cream to see if it helps.

## 2018-08-29 NOTE — Assessment & Plan Note (Signed)
Weight stable, recommended weight loss

## 2018-08-29 NOTE — Assessment & Plan Note (Signed)
Repeat lipid panel Continue atorvastatin 40mg  daily.

## 2018-08-29 NOTE — Assessment & Plan Note (Signed)
HPI: Brings BP log, continues to check once daily, all readings very well controlled. No orthostatic symptoms, feels he is doing well.  A: Essential HTN well controlled  P: Continue chlorthalidone 50mg  daily Metoprolol tartrate 50mg  BID

## 2018-08-29 NOTE — Progress Notes (Signed)
  Subjective:  HPI: [name] is a 53 y.o. male who presents for f/u HTN.  Please see Assessment and Plan below for the status of his chronic medical problems.  Review of Systems: Review of Systems  Constitutional: Negative for fever.  Respiratory: Negative for cough.   Cardiovascular: Negative for chest pain.  Musculoskeletal: Negative for myalgias.  Skin: Positive for rash. Negative for itching.  Neurological: Negative for dizziness and headaches.  Psychiatric/Behavioral: Negative for depression.    Objective:  Physical Exam: Vitals:   08/24/18 1016  BP: 112/76  Pulse: 66  Temp: 98.2 F (36.8 C)  TempSrc: Oral  SpO2: 97%  Weight: 283 lb (128.4 kg)   Body mass index is 40.61 kg/m. Physical Exam Nursing note reviewed.  Constitutional:      Appearance: Normal appearance. He is obese.  Cardiovascular:     Rate and Rhythm: Normal rate and regular rhythm.  Pulmonary:     Effort: Pulmonary effort is normal.     Breath sounds: Normal breath sounds.  Abdominal:     General: Abdomen is flat.     Palpations: Abdomen is soft.  Skin:    Comments: Patchy hyperpigmented skin with some flaking over blateral extensor surfaces of arms, no rash on trunk, legs not exposed (patient denies rash on legs)  Neurological:     Mental Status: He is alert.    Assessment & Plan:  See Encounters Tab for problem based charting.  Medications Ordered Meds ordered this encounter  Medications  . triamcinolone cream (KENALOG) 0.1 %    Sig: Apply 1 application topically 2 (two) times daily.    Dispense:  45 g    Refill:  3   Other Orders Orders Placed This Encounter  Procedures  . BMP8+Anion Gap  . Lipid Profile  . CBC with Diff  . Hemoglobin A1c   Follow Up: Return in about 6 months (around 02/24/2019).

## 2018-10-30 ENCOUNTER — Ambulatory Visit: Payer: Medicare HMO | Admitting: Physical Medicine & Rehabilitation

## 2018-11-01 ENCOUNTER — Encounter: Payer: Medicare HMO | Attending: Physical Medicine & Rehabilitation | Admitting: Physical Medicine & Rehabilitation

## 2018-11-01 ENCOUNTER — Encounter: Payer: Self-pay | Admitting: Physical Medicine & Rehabilitation

## 2018-11-01 ENCOUNTER — Other Ambulatory Visit: Payer: Self-pay

## 2018-11-01 VITALS — BP 106/72 | HR 74 | Temp 98.2°F | Ht 70.0 in | Wt 283.0 lb

## 2018-11-01 DIAGNOSIS — G8114 Spastic hemiplegia affecting left nondominant side: Secondary | ICD-10-CM

## 2018-11-01 DIAGNOSIS — I619 Nontraumatic intracerebral hemorrhage, unspecified: Secondary | ICD-10-CM

## 2018-11-01 DIAGNOSIS — G8929 Other chronic pain: Secondary | ICD-10-CM | POA: Diagnosis not present

## 2018-11-01 DIAGNOSIS — M25512 Pain in left shoulder: Secondary | ICD-10-CM | POA: Diagnosis not present

## 2018-11-01 NOTE — Progress Notes (Signed)
Subjective:    Patient ID: Jeremy [name], male    DOB: 09-Feb-1966, 53 y.o.   MRN: [id number]  HPI   Mr. Mole is here in follow-up of his right basal ganglia and thalamic hemorrhages.  I last saw him about a year ago. He states that he continues on his HEP at home. He walks at home and does stretching exercises with a stick. He uses a walker for balance. He has a power chair for outside the home.   He complains of some left shoulder pain after he does his exercises. The pain is posterior. His left leg still feels heavy.   He denies any new health issues. Bowel and bladder functiona are regulated  Pain Inventory Average Pain 0 Pain Right Now 0 My pain is na  In the last 24 hours, has pain interfered with the following? General activity 0 Relation with others 0 Enjoyment of life 0 What TIME of day is your pain at its worst? na Sleep (in general) Fair  Pain is worse with: na Pain improves with: na Relief from Meds: na  Mobility use a walker ability to climb steps?  no do you drive?  no use a wheelchair transfers alone  Function disabled: date disabled na  Neuro/Psych numbness tingling  Prior Studies Any changes since last visit?  no  Physicians involved in your care Any changes since last visit?  no   Family History  Problem Relation Age of Onset  . Hypertension Mother   . Heart disease Father   . Gout Father   . Hypertension Father    Social History   Socioeconomic History  . Marital status: Married    Spouse name: Not on file  . Number of children: 0  . Years of education: 12th  . Highest education level: Not on file  Occupational History  . Occupation: retired    Fish farm manager: [employer]  . Financial resource strain: Not on file  . Food insecurity    Worry: Not on file    Inability: Not on file  . Transportation needs    Medical: Not on file    Non-medical: Not on file  Tobacco Use  . Smoking status: Never Smoker   . Smokeless tobacco: Never Used  Substance and Sexual Activity  . Alcohol use: No    Alcohol/week: 0.0 standard drinks  . Drug use: No  . Sexual activity: Not on file  Lifestyle  . Physical activity    Days per week: Not on file    Minutes per session: Not on file  . Stress: Not on file  Relationships  . Social Herbalist on phone: Not on file    Gets together: Not on file    Attends religious service: Not on file    Active member of club or organization: Not on file    Attends meetings of clubs or organizations: Not on file    Relationship status: Not on file  Other Topics Concern  . Not on file  Social History Narrative   Patient lives with his wife..and drinks sodas.   Past Surgical History:  Procedure Laterality Date  . TRACHEOSTOMY TUBE PLACEMENT  10/11/2011   Procedure: TRACHEOSTOMY;  Surgeon: Melida Quitter, MD;  Location: [facility];  Service: ENT;  Laterality: N/A;   Past Medical History:  Diagnosis Date  . High cholesterol   . Hypertension 10/01/2011   basal ganglia  . ICH (intracerebral hemorrhage) ([place]) 10/01/2011  ICH in 09/2011.    . Stroke (HCC)    BP 106/72   Pulse 74   Temp 98.2 F (36.8 C)   Ht 5\' 10"  (1.778 m)   Wt 283 lb (128.4 kg)   SpO2 92%   BMI 40.61 kg/m   Opioid Risk Score:   Fall Risk Score:  `1  Depression screen PHQ 2/9  Depression screen [facility] 2/9 08/24/2018 04/13/2018 09/29/2017 05/19/2017 03/31/2017 09/23/2016 05/13/2016  Decreased Interest 0 0 0 0 0 0 0  Down, Depressed, Hopeless 0 0 0 0 0 0 0  PHQ - 2 Score 0 0 0 0 0 0 0  Altered sleeping - 0 - - - - -  Tired, decreased energy - 0 - - - - -  Change in appetite - 1 - - - - -  Feeling bad or failure about yourself  - 0 - - - - -  Trouble concentrating - 0 - - - - -  Moving slowly or fidgety/restless - 2 - - - - -  Suicidal thoughts - 0 - - - - -  PHQ-9 Score - 3 - - - - -  Difficult doing work/chores - Not difficult at all - - - - -  Some recent data might be hidden     Review  of Systems  Constitutional: Negative.   HENT: Negative.   Eyes: Negative.   Respiratory: Negative.   Cardiovascular: Negative.   Gastrointestinal: Negative.   Endocrine: Negative.   Genitourinary: Negative.   Musculoskeletal: Negative.   Skin: Negative.   Allergic/Immunologic: Negative.   Neurological: Positive for weakness and numbness.  Hematological: Negative.   Psychiatric/Behavioral: Negative.   All other systems reviewed and are negative.      Objective:   Physical Exam General: No acute distress HEENT: EOMI, oral membranes moist Cards: reg rate  Chest: normal effort Abdomen: Soft, NT, ND Skin: dry, intact Extremities: no edema Neuro: speech dysarthric. Improved insight. .  Reflexes are 3+ on the left. No resting tone. 5/5 motor on the right. LUE and LLE 5/5. Sensory 1/2 LUE and LLE---no change Musculoskeletal: pain along left lat and subscapularis, trap with palpation. No pain with ROM. Psych:pleasant and appropriate   Assessment & Plan:  ASSESSMENT: 1. Right BG/thalamic hemorrhage- he is making gradual progress  2. HTN  3. Dysphagia (resolved) .  4. Spastic left hemiparesis with intermittent spasms  5. Dental caries  6.  Left carpal tunnel syndrome.  Appears mild and superimposed upon his left sided sensory deficits from his ICH  Plan:  1. Continue baclofen for spasms 2. Still needs dental work!! 3. Tylenol for pain. Can use more liberally 4. continue left CTS splint. Sx improved 5. Continue HEP. hes' doing a nice job with this.will integrate therabands  -provided scapular ROM exercises 6. Follow up here in about 55months. 15 minutes of face to face patient care time were spent during this visit. All questions were encouraged and answered.

## 2018-11-01 NOTE — Patient Instructions (Addendum)
    THERA-BANDS OR EXERCISE BANDS AT [facility]

## 2018-11-02 ENCOUNTER — Other Ambulatory Visit: Payer: Self-pay | Admitting: *Deleted

## 2018-11-02 NOTE — Telephone Encounter (Signed)
Pt is changing to [place].

## 2018-11-03 ENCOUNTER — Encounter: Payer: Self-pay | Admitting: *Deleted

## 2018-11-03 MED ORDER — CHLORTHALIDONE 50 MG PO TABS: 50.0000 mg | ORAL_TABLET | Freq: Every day | ORAL | 3 refills | Status: DC

## 2018-11-03 MED ORDER — METOPROLOL TARTRATE 50 MG PO TABS: 50.0000 mg | ORAL_TABLET | Freq: Two times a day (BID) | ORAL | 3 refills | Status: DC

## 2018-11-06 ENCOUNTER — Other Ambulatory Visit: Payer: Self-pay

## 2018-11-06 DIAGNOSIS — G8114 Spastic hemiplegia affecting left nondominant side: Secondary | ICD-10-CM

## 2018-11-06 DIAGNOSIS — I619 Nontraumatic intracerebral hemorrhage, unspecified: Secondary | ICD-10-CM

## 2018-11-06 MED ORDER — ATORVASTATIN CALCIUM 40 MG PO TABS: 40.0000 mg | ORAL_TABLET | Freq: Every day | ORAL | 1 refills | Status: DC

## 2018-11-06 MED ORDER — BACLOFEN 10 MG PO TABS: 10.0000 mg | ORAL_TABLET | Freq: Two times a day (BID) | ORAL | 1 refills | Status: DC | PRN

## 2018-11-06 NOTE — Telephone Encounter (Signed)
Jeremy [name] with [facility] pharmacy requesting refill on atorvastatin (LIPITOR) 40 MG tablet and baclofen (LIORESAL) 10 MG tablet,

## 2018-11-16 ENCOUNTER — Encounter: Payer: BC Managed Care – PPO | Admitting: Internal Medicine

## 2018-12-14 ENCOUNTER — Other Ambulatory Visit: Payer: Self-pay

## 2018-12-14 ENCOUNTER — Ambulatory Visit (INDEPENDENT_AMBULATORY_CARE_PROVIDER_SITE_OTHER): Payer: Medicare HMO | Admitting: Internal Medicine

## 2018-12-14 ENCOUNTER — Encounter: Payer: Self-pay | Admitting: Internal Medicine

## 2018-12-14 VITALS — BP 110/74 | HR 76 | Temp 98.4°F | Ht 70.0 in | Wt 275.8 lb

## 2018-12-14 DIAGNOSIS — Z23 Encounter for immunization: Secondary | ICD-10-CM

## 2018-12-14 DIAGNOSIS — G8114 Spastic hemiplegia affecting left nondominant side: Secondary | ICD-10-CM

## 2018-12-14 DIAGNOSIS — I619 Nontraumatic intracerebral hemorrhage, unspecified: Secondary | ICD-10-CM

## 2018-12-14 DIAGNOSIS — I69154 Hemiplegia and hemiparesis following nontraumatic intracerebral hemorrhage affecting left non-dominant side: Secondary | ICD-10-CM | POA: Diagnosis not present

## 2018-12-14 DIAGNOSIS — K029 Dental caries, unspecified: Secondary | ICD-10-CM

## 2018-12-14 DIAGNOSIS — I10 Essential (primary) hypertension: Secondary | ICD-10-CM | POA: Diagnosis not present

## 2018-12-14 DIAGNOSIS — Z79899 Other long term (current) drug therapy: Secondary | ICD-10-CM | POA: Diagnosis not present

## 2018-12-14 DIAGNOSIS — R21 Rash and other nonspecific skin eruption: Secondary | ICD-10-CM

## 2018-12-14 MED ORDER — METOPROLOL SUCCINATE ER 100 MG PO TB24: 100.0000 mg | ORAL_TABLET | Freq: Every day | ORAL | 3 refills | Status: DC

## 2018-12-14 NOTE — Assessment & Plan Note (Signed)
He reports he still has not seen the dentist had him show me his dental card and pointed out the number to call for [facility] to find dentist that would be covered by his insurance

## 2018-12-14 NOTE — Progress Notes (Addendum)
  Subjective:  HPI: [name] is a 53 y.o. male who presents for f/u HTN  Please see Assessment and Plan below for the status of his chronic medical problems.  Review of Systems: Review of Systems  Constitutional: Negative for chills and fever.  Respiratory: Negative for cough.   Cardiovascular: Negative for chest pain.  Gastrointestinal: Negative for heartburn.  Musculoskeletal: Negative for myalgias.  Skin: Negative for rash.  Neurological: Negative for dizziness.    Objective:  Physical Exam: Vitals:   12/14/18 0852  BP: 110/74  Pulse: 76  Temp: 98.4 F (36.9 C)  TempSrc: Oral  SpO2: 98%  Weight: 275 lb 12.8 oz (125.1 kg)  Height: 5\' 10"  (1.778 m)   Body mass index is 39.57 kg/m. Physical Exam Vitals signs and nursing note reviewed.  Constitutional:      Appearance: Normal appearance. He is obese.     Comments: In [facility]  Cardiovascular:     Rate and Rhythm: Normal rate and regular rhythm.  Pulmonary:     Effort: Pulmonary effort is normal.     Breath sounds: Normal breath sounds.  Skin:    Findings: No rash (examined abdomen).  Neurological:     Mental Status: He is alert.    Assessment & Plan:  See Encounters Tab for problem based charting.  Medications Ordered Meds ordered this encounter  Medications  . metoprolol succinate (TOPROL XL) 100 MG 24 hr tablet    Sig: Take 1 tablet (100 mg total) by mouth daily. Take with or immediately following a meal.    Dispense:  90 tablet    Refill:  3   Other Orders Orders Placed This Encounter  Procedures  . For home use only DME Hospital bed    Order Specific Question:   Length of Need    Answer:   Lifetime    Order Specific Question:   Patient has (list medical condition):    Answer:   spastic hemiparesis of left side as late effect of stroke    Order Specific Question:   The above medical condition requires:    Answer:   Patient requires the ability to reposition frequently    Order Specific  Question:   Bed type    Answer:   Semi-electric  . Flu Vaccine QUAD 36+ mos IM   Follow Up: Return in about 6 months (around 06/14/2019).   ADDENDUM: Placed order for power wheelchair.  Patient is unable to preform ADLs of feeding, tolieting, dressing without assistance from wheelchair due to history of ICH with resultant spastic hemiplegia of his left side.  He is unable to use a manual wheelchair as he does not have strength in this left arm to propel the WC. -I am referring patient for a Mobility/Seating evaluation for Power Wheelchair

## 2018-12-14 NOTE — Assessment & Plan Note (Signed)
HPI: He brings me letter from [facility] the appear to have an issue with the metoprolol prescription and are requesting a new refill to the mail order pharmacy.  Notably they are requesting metoprolol XL instead of tartrate.  He continues to bring his daily blood pressure log which shows excellent control with most readings and the systolic blood pressure in the 110 range  Assessment essential hypertension well-controlled  Plan Change metoprolol tartrate 50 mg twice daily to metoprolol succinate 100 mg daily Continue chlorthalidone 50 mg daily

## 2018-12-14 NOTE — Assessment & Plan Note (Signed)
Improved with Kenalog

## 2018-12-14 NOTE — Assessment & Plan Note (Addendum)
HPI: He is overall doing well.  He continues to have spastic hemiplegia of his left side.  He is following once yearly with Dr. Tessa Lerner and he saw him last month.  He continues baclofen for spasticity.  He reports that his hospital bed is no longer working and that he needs another one.  He also reports that he is having some issues with his power wheelchair.  He does have 0 out of 5 strength in the left upper and left lower extremity as a result of his intracerebral hemorrhage in 2013.  A: Spastic Hemiplegia  Due to Spastic hemiparesis of left non dominate side patient requires frequent changes in body position and has an immediate need for a change in body position The patient has a medical condition which requires positioning of the body in ways not feasible with an ordinary bed  Will also have nursing contact about potential repair or replacement of his power wheelchair.

## 2018-12-26 ENCOUNTER — Telehealth: Payer: Self-pay | Admitting: *Deleted

## 2018-12-26 NOTE — Telephone Encounter (Signed)
Community message sent to Jolee Ewing at [facility] for order for hospital bed.  Community message sent to Andria Rhein at [facility] asking for status update on patient's power w/c repairs.

## 2018-12-26 NOTE — Telephone Encounter (Signed)
Jacob Moores, Orvis Brill, RN        Patient should be eligible for a new power wheelchair. Please let me know when order is in epic for Power Wheelchair and I will pull it to begin the process. Thank you!

## 2018-12-26 NOTE — Telephone Encounter (Signed)
Can the [place] order be placed as an addendum on my last visit or will he need a dedicated visit for [place]?

## 2018-12-26 NOTE — Telephone Encounter (Signed)
Following community message received regarding order for hospital bed:  Cat[place], Germain Osgood, Orvis Brill, RN; [place], Stanford Breed, [place]; [place], [place], [place]        I have pulled the order and the note. it is processing now. thanks

## 2018-12-27 NOTE — Telephone Encounter (Signed)
Andria Rhein at [facility] notified that order is in for power w/c and F2F was 12/14/2018. Hubbard Hartshorn, BSN, RN-BC

## 2018-12-27 NOTE — Addendum Note (Signed)
Addended by: Joni Reining C on: 12/27/2018 11:44 AM   Modules accepted: Orders

## 2018-12-27 NOTE — Telephone Encounter (Signed)
I believe I have placed the appropriate order, please make sure and let me know if it needs to be corrected, thanks again lauren.

## 2018-12-29 DIAGNOSIS — I6789 Other cerebrovascular disease: Secondary | ICD-10-CM | POA: Diagnosis not present

## 2018-12-29 DIAGNOSIS — G8114 Spastic hemiplegia affecting left nondominant side: Secondary | ICD-10-CM | POA: Diagnosis not present

## 2018-12-29 DIAGNOSIS — R32 Unspecified urinary incontinence: Secondary | ICD-10-CM | POA: Diagnosis not present

## 2018-12-29 DIAGNOSIS — R1319 Other dysphagia: Secondary | ICD-10-CM | POA: Diagnosis not present

## 2019-01-03 NOTE — Telephone Encounter (Signed)
Jacob Moores, Orvis Brill, RN        Hello Lauren -   Sorry I was on vacation for 3 days. I pulled the order and the recent office visit note dated 12/14/18.   If possible, can Dr. Heber Pisinemo add the following statement to that note:  "I am referring patient for a Mobility/Seating evaluation for Power Wheelchair. "   If so, we should be able to use this note as the F2F. Thanks so much!

## 2019-01-03 NOTE — Telephone Encounter (Signed)
Community message sent to Andria Rhein at [facility] that note has been updated. Hubbard Hartshorn, BSN, RN-BC

## 2019-01-03 NOTE — Telephone Encounter (Signed)
Added to note

## 2019-01-05 NOTE — Telephone Encounter (Signed)
Jeremy [name], Orvis Brill, RN; [place], [place], [place]        OK [place]! Please be looking for rx for PT Mobility/seating eval for signature.

## 2019-01-28 DIAGNOSIS — R32 Unspecified urinary incontinence: Secondary | ICD-10-CM | POA: Diagnosis not present

## 2019-01-28 DIAGNOSIS — I6789 Other cerebrovascular disease: Secondary | ICD-10-CM | POA: Diagnosis not present

## 2019-01-28 DIAGNOSIS — G8114 Spastic hemiplegia affecting left nondominant side: Secondary | ICD-10-CM | POA: Diagnosis not present

## 2019-01-28 DIAGNOSIS — R1319 Other dysphagia: Secondary | ICD-10-CM | POA: Diagnosis not present

## 2019-02-06 DIAGNOSIS — I6789 Other cerebrovascular disease: Secondary | ICD-10-CM | POA: Diagnosis not present

## 2019-02-06 DIAGNOSIS — R32 Unspecified urinary incontinence: Secondary | ICD-10-CM | POA: Diagnosis not present

## 2019-02-06 DIAGNOSIS — G8114 Spastic hemiplegia affecting left nondominant side: Secondary | ICD-10-CM | POA: Diagnosis not present

## 2019-02-20 ENCOUNTER — Encounter: Payer: Self-pay | Admitting: Internal Medicine

## 2019-02-20 ENCOUNTER — Ambulatory Visit (INDEPENDENT_AMBULATORY_CARE_PROVIDER_SITE_OTHER): Payer: Medicare HMO | Admitting: Internal Medicine

## 2019-02-20 ENCOUNTER — Other Ambulatory Visit: Payer: Self-pay

## 2019-02-20 DIAGNOSIS — T148XXA Other injury of unspecified body region, initial encounter: Secondary | ICD-10-CM | POA: Insufficient documentation

## 2019-02-20 DIAGNOSIS — Z993 Dependence on wheelchair: Secondary | ICD-10-CM | POA: Diagnosis not present

## 2019-02-20 DIAGNOSIS — L989 Disorder of the skin and subcutaneous tissue, unspecified: Secondary | ICD-10-CM | POA: Diagnosis not present

## 2019-02-20 NOTE — Patient Instructions (Addendum)
Thank you for allowing [place] to provide your care today. Today we discussed your abdominal sore.     Today we made the following changes to your medications:   Please STOP using kenalog cream or any other steroid cream.   Please leave the dressing in place for 4-5 days. Avoid showering and getting the area wet but make sure to wipe the area around the wound and keep it clean.   Please do not use powder in the crease of your abdomen as this clumps up. You can try using abdominal pads or other pads that can help soak up any moisture.   Please have someone change out the dressing in 4-5 days.   Please follow-up in one week to reassess the wound.    If you note signs of infection - purulent white drainage, surrounding redness or warmth, increased tenderness, please call the clinic and make an earlier appointment.   Please call the internal medicine center clinic if you have any questions or concerns, we may be able to help and keep you from a long and expensive emergency room wait. Our clinic and after hours phone number is [phone], the best time to call is Monday through Friday 9 am to 4 pm but there is always someone available 24/7 if you have an emergency. If you need medication refills please notify your pharmacy one week in advance and they will send [place] a request.

## 2019-02-20 NOTE — Assessment & Plan Note (Addendum)
Previously had an abdominal rash that was treated with kenalog cream. He states he was told to come back if the area started to bother him more. He states it does not hurt but he has noticed that it does not seem to be healing and may be worse. He denies drainage, pain, fever, or chills. He has been using the cream daily. It appears the steroid cream may have caused skin breakdown or the rash became open as the lesion is located on his panus and is likely rubbing against his pants. Picture included under PE. It does not appear infected at this time.   - well circumscribed lesion with surround epibole - cauterized edges to encourage healing  - placed allevyn pad, given other half of pad, patient's wife will change out pad in 5 days, instructed to keep area clean and dry - stop kenalog - stop using baby powder, encouraged use of pads to soak up sweat from panus - return in one week to reevaluate - return earlier if signs of infection

## 2019-02-20 NOTE — Progress Notes (Signed)
Internal Medicine Clinic Attending  Case discussed with Dr. Seawell at the time of the visit.  We reviewed the resident's history and exam and pertinent patient test results.  I agree with the assessment, diagnosis, and plan of care documented in the resident's note.    

## 2019-02-20 NOTE — Progress Notes (Signed)
   CC: abdominal pain  HPI:  [name] is a 54 y.o. with PMH as below.   Please see A&P for assessment of the patient's acute and chronic medical conditions.   Past Medical History:  Diagnosis Date  . High cholesterol   . Hypertension 10/01/2011   basal ganglia  . ICH (intracerebral hemorrhage) ([place]) 10/01/2011   ICH in 09/2011.    . Stroke [facility])    Review of Systems:   Review of Systems  Constitutional: Negative for chills, diaphoresis and fever.  Gastrointestinal: Negative for abdominal pain and nausea.  Skin: Negative for itching and rash.   Physical Exam: Constitution: NAD, obese, wheelchair bound Respiratory: non-labored breathing, on RA Abdominal: NTTP, soft, non-distended Neuro: normal affect, a&ox3 Skin: 3x2 well circumscribed suprapubic lesion with rolled edges, no purulence, erythema, or warmth      Vitals:   02/20/19 0850  BP: 108/70  Pulse: 82  Temp: 98.2 F (36.8 C)  SpO2: 100%  Weight: 269 lb 9.6 oz (122.3 kg)  Height: 5\' 10"  (1.778 m)     Assessment & Plan:   See Encounters Tab for problem based charting.  Patient discussed with Dr. Philipp Ovens

## 2019-02-27 ENCOUNTER — Other Ambulatory Visit: Payer: Self-pay

## 2019-02-27 ENCOUNTER — Ambulatory Visit (INDEPENDENT_AMBULATORY_CARE_PROVIDER_SITE_OTHER): Payer: Medicare HMO | Admitting: Internal Medicine

## 2019-02-27 ENCOUNTER — Encounter: Payer: Self-pay | Admitting: Internal Medicine

## 2019-02-27 VITALS — BP 148/83 | HR 90 | Temp 98.6°F | Ht 70.0 in | Wt 270.1 lb

## 2019-02-27 DIAGNOSIS — S31109D Unspecified open wound of abdominal wall, unspecified quadrant without penetration into peritoneal cavity, subsequent encounter: Secondary | ICD-10-CM

## 2019-02-27 DIAGNOSIS — X58XXXD Exposure to other specified factors, subsequent encounter: Secondary | ICD-10-CM | POA: Diagnosis not present

## 2019-02-27 DIAGNOSIS — L03311 Cellulitis of abdominal wall: Secondary | ICD-10-CM

## 2019-02-27 DIAGNOSIS — L304 Erythema intertrigo: Secondary | ICD-10-CM

## 2019-02-27 DIAGNOSIS — T148XXA Other injury of unspecified body region, initial encounter: Secondary | ICD-10-CM

## 2019-02-27 MED ORDER — CEPHALEXIN 500 MG PO CAPS: 500.0000 mg | ORAL_CAPSULE | Freq: Four times a day (QID) | ORAL | 0 refills | Status: AC

## 2019-02-27 MED ORDER — NYSTATIN 100000 UNIT/GM EX POWD: 1.0000 "application " | Freq: Three times a day (TID) | CUTANEOUS | 0 refills | Status: DC

## 2019-02-27 NOTE — Patient Instructions (Addendum)
Thank you for allowing [place] to provide your care today. Today we discussed your abdominal wound     Today we made the following changes to your medications:   Please START taking  Keflex 500 mg tablet - take one tablet four times per day for five days  Nystatin powder - please apply powder to creases of your abdomen three times a day to keep area dry.    Please keep your abdomen clean and dry. If sweating or odor is noted, you can rinse the area with clean bottled water, reapply nystatin powder and change dressing to abdominal wound. Make sure to change dressing over your wound every 2-3 days.   Please follow-up in one week.    Please call the internal medicine center clinic if you have any questions or concerns, we may be able to help and keep you from a long and expensive emergency room wait. Our clinic and after hours phone number is [phone], the best time to call is Monday through Friday 9 am to 4 pm but there is always someone available 24/7 if you have an emergency. If you need medication refills please notify your pharmacy one week in advance and they will send [place] a request.

## 2019-02-27 NOTE — Assessment & Plan Note (Signed)
He has been trying to keep the area under his panus dry and clean but did not remember to place pads to the area although he stopped using baby powder. He sweats a lot and as a result has developed excoriations of the skin along his panus, especially on the left. The abdominal wound is healing but small amount of purulence is noted  - increase dressing changes to 2-3 times per week - start nystatin powder tid  - keep areas clean and dry - keflex qid for five days  - f/u in one week

## 2019-02-27 NOTE — Progress Notes (Signed)
   CC: abdominal wound  HPI:  Jeremy [name] is a 54 y.o. with PMH as below.   Please see A&P for assessment of the patient's acute and chronic medical conditions.   Past Medical History:  Diagnosis Date  . High cholesterol   . Hypertension 10/01/2011   basal ganglia  . ICH (intracerebral hemorrhage) ([place]) 10/01/2011   ICH in 09/2011.    . Stroke [facility])    Review of Systems:   Review of Systems  Constitutional: Negative for chills, diaphoresis and fever.  Gastrointestinal: Negative for abdominal pain, nausea and vomiting.  Skin: Negative for itching and rash.       Some pain where skin excoriations are   Physical Exam: Constitution: NAD, morbidly obese HENT: [place]/AT Abdominal: NTTP, soft, non-distended Neuro: normal affect, a&ox3 Skin: 3x2 well circumscribed suprapubic lesion, small amount drainage. Surrounding excoriation and redness in area of panus.       Vitals:   02/27/19 0852  BP: (!) 148/83  Pulse: 90  Temp: 98.6 F (37 C)  TempSrc: Oral  SpO2: 98%  Weight: 270 lb 1.6 oz (122.5 kg)  Height: 5\' 10"  (1.778 m)    Assessment & Plan:   See Encounters Tab for problem based charting.  Patient discussed with Dr. Lynnae January

## 2019-02-28 DIAGNOSIS — G8114 Spastic hemiplegia affecting left nondominant side: Secondary | ICD-10-CM | POA: Diagnosis not present

## 2019-02-28 DIAGNOSIS — I6789 Other cerebrovascular disease: Secondary | ICD-10-CM | POA: Diagnosis not present

## 2019-02-28 DIAGNOSIS — R1319 Other dysphagia: Secondary | ICD-10-CM | POA: Diagnosis not present

## 2019-02-28 DIAGNOSIS — R32 Unspecified urinary incontinence: Secondary | ICD-10-CM | POA: Diagnosis not present

## 2019-02-28 NOTE — Progress Notes (Signed)
Internal Medicine Clinic Attending  Case discussed with Dr. Seawell at the time of the visit.  We reviewed the resident's history and exam and pertinent patient test results.  I agree with the assessment, diagnosis, and plan of care documented in the resident's note.    

## 2019-03-08 ENCOUNTER — Other Ambulatory Visit: Payer: Self-pay

## 2019-03-08 ENCOUNTER — Ambulatory Visit (INDEPENDENT_AMBULATORY_CARE_PROVIDER_SITE_OTHER): Payer: Medicare HMO | Admitting: Internal Medicine

## 2019-03-08 DIAGNOSIS — S31109D Unspecified open wound of abdominal wall, unspecified quadrant without penetration into peritoneal cavity, subsequent encounter: Secondary | ICD-10-CM

## 2019-03-08 DIAGNOSIS — X58XXXD Exposure to other specified factors, subsequent encounter: Secondary | ICD-10-CM

## 2019-03-08 DIAGNOSIS — L304 Erythema intertrigo: Secondary | ICD-10-CM

## 2019-03-08 DIAGNOSIS — T148XXA Other injury of unspecified body region, initial encounter: Secondary | ICD-10-CM

## 2019-03-08 MED ORDER — NYSTATIN 100000 UNIT/GM EX POWD: 1.0000 "application " | Freq: Three times a day (TID) | CUTANEOUS | 0 refills | Status: DC

## 2019-03-08 NOTE — Patient Instructions (Signed)
Thank you for allowing [place] to provide your care today.   The wound on your abdomen is healing well. Please continue to change the dressing every 2-3 days while it is healing. Keep the area clean and dry.   Apply nystatin powder as needed to the crease under you belly. You can also use cotton towel or any type of pad to soak up additional sweat. Increased sweat and moisture in this area can lead to skin breakdown and infection and needs to be kept dry.   I have refilled the nystatin powder for you.   I have ordered the following labs for you:   Please follow-up with your PCP as needed. Please follow-up if symptoms worsen or if you note any signs of infection such as swelling, redness, increased soreness, or pus.    Please call the internal medicine center clinic if you have any questions or concerns, we may be able to help and keep you from a long and expensive emergency room wait. Our clinic and after hours phone number is [phone], the best time to call is Monday through Friday 9 am to 4 pm but there is always someone available 24/7 if you have an emergency. If you need medication refills please notify your pharmacy one week in advance and they will send [place] a request.

## 2019-03-08 NOTE — Assessment & Plan Note (Signed)
He completed his antibiotics and denies any pain or purulent drainage to the area. The wound area appears smaller in size. He has been using nystatin powder and other areas that were beginning to have excoriation have also resolved.   - cont. To keep area clean and dry - change allevyn dressing every 2-3 days.  - f/u if signs of infection develop - continue nystatin powder, also recommended using cotton towel or other pads to soak up sweat beneath pannus

## 2019-03-08 NOTE — Progress Notes (Signed)
   CC: abdominal wound  HPI:  [name] is a 54 y.o. with PMH as below.   Please see A&P for assessment of the patient's acute and chronic medical conditions.   Past Medical History:  Diagnosis Date  . High cholesterol   . Hypertension 10/01/2011   basal ganglia  . ICH (intracerebral hemorrhage) ([place]) 10/01/2011   ICH in 09/2011.    . Stroke [facility])    Review of Systems:   Review of Systems  Constitutional: Negative for chills and fever.  Gastrointestinal: Negative for abdominal pain and nausea.  Skin: Negative for itching and rash.   Physical Exam:   Constitution: NAD, appears stated age Respiratory: non-labored breathing, on RA Abdominal: NTTP, soft, non-distended Neuro: normal affect, a&ox3 Skin: abdominal wound without purulent drainage or erythema, no odor      Vitals:   03/08/19 1320  BP: 116/79  Pulse: 84  Temp: 98.6 F (37 C)  TempSrc: Oral  SpO2: 97%  Weight: 270 lb 4.8 oz (122.6 kg)  Height: 5\' 10"  (1.778 m)    Assessment & Plan:   See Encounters Tab for problem based charting.  Patient discussed with Dr. Dareen Piano

## 2019-03-12 NOTE — Progress Notes (Signed)
Internal Medicine Clinic Attending  Case discussed with Dr. Seawell at the time of the visit.  We reviewed the resident's history and exam and pertinent patient test results.  I agree with the assessment, diagnosis, and plan of care documented in the resident's note.    

## 2019-03-20 ENCOUNTER — Other Ambulatory Visit: Payer: Self-pay | Admitting: Internal Medicine

## 2019-03-20 DIAGNOSIS — L304 Erythema intertrigo: Secondary | ICD-10-CM

## 2019-03-21 NOTE — Telephone Encounter (Signed)
Last filled 03/08/19 by Dr Sharon Seller #[id number] .Regenia Skeeter, Amberlynn Tempesta Cassady2/3/20219:21 AM

## 2019-03-31 DIAGNOSIS — R32 Unspecified urinary incontinence: Secondary | ICD-10-CM | POA: Diagnosis not present

## 2019-03-31 DIAGNOSIS — I6789 Other cerebrovascular disease: Secondary | ICD-10-CM | POA: Diagnosis not present

## 2019-03-31 DIAGNOSIS — G8114 Spastic hemiplegia affecting left nondominant side: Secondary | ICD-10-CM | POA: Diagnosis not present

## 2019-03-31 DIAGNOSIS — R1319 Other dysphagia: Secondary | ICD-10-CM | POA: Diagnosis not present

## 2019-04-16 ENCOUNTER — Telehealth: Payer: Self-pay | Admitting: *Deleted

## 2019-04-16 ENCOUNTER — Other Ambulatory Visit: Payer: Self-pay

## 2019-04-16 NOTE — Telephone Encounter (Signed)
Received faxed Standard written order/PT Mobility Seating Eval/Detailed Product description for power w/c. Placed in PCP's box for signatures. Will then fax to Andria Rhein at [phone]. Hubbard Hartshorn, BSN, RN-BC

## 2019-04-17 MED ORDER — ATORVASTATIN CALCIUM 40 MG PO TABS: 40.0000 mg | ORAL_TABLET | Freq: Every day | ORAL | 1 refills | Status: DC

## 2019-04-23 NOTE — Telephone Encounter (Signed)
Signed Standard written order/PT Mobility Seating Eval/Detailed Product description for powerl w/c faxed to Andria Rhein at [phone]. Confirmation receipt received. Hubbard Hartshorn, BSN, RN-BC

## 2019-04-28 DIAGNOSIS — R1319 Other dysphagia: Secondary | ICD-10-CM | POA: Diagnosis not present

## 2019-04-28 DIAGNOSIS — G8114 Spastic hemiplegia affecting left nondominant side: Secondary | ICD-10-CM | POA: Diagnosis not present

## 2019-04-28 DIAGNOSIS — I6789 Other cerebrovascular disease: Secondary | ICD-10-CM | POA: Diagnosis not present

## 2019-04-28 DIAGNOSIS — R32 Unspecified urinary incontinence: Secondary | ICD-10-CM | POA: Diagnosis not present

## 2019-05-10 DIAGNOSIS — I6789 Other cerebrovascular disease: Secondary | ICD-10-CM | POA: Diagnosis not present

## 2019-05-10 DIAGNOSIS — R1319 Other dysphagia: Secondary | ICD-10-CM | POA: Diagnosis not present

## 2019-05-10 DIAGNOSIS — R32 Unspecified urinary incontinence: Secondary | ICD-10-CM | POA: Diagnosis not present

## 2019-05-10 DIAGNOSIS — G8114 Spastic hemiplegia affecting left nondominant side: Secondary | ICD-10-CM | POA: Diagnosis not present

## 2019-05-29 DIAGNOSIS — R32 Unspecified urinary incontinence: Secondary | ICD-10-CM | POA: Diagnosis not present

## 2019-05-29 DIAGNOSIS — I6789 Other cerebrovascular disease: Secondary | ICD-10-CM | POA: Diagnosis not present

## 2019-05-29 DIAGNOSIS — R1319 Other dysphagia: Secondary | ICD-10-CM | POA: Diagnosis not present

## 2019-05-29 DIAGNOSIS — G8114 Spastic hemiplegia affecting left nondominant side: Secondary | ICD-10-CM | POA: Diagnosis not present

## 2019-06-07 ENCOUNTER — Other Ambulatory Visit: Payer: Self-pay

## 2019-06-07 ENCOUNTER — Encounter: Payer: Self-pay | Admitting: Internal Medicine

## 2019-06-07 ENCOUNTER — Ambulatory Visit (INDEPENDENT_AMBULATORY_CARE_PROVIDER_SITE_OTHER): Payer: Medicare HMO | Admitting: Internal Medicine

## 2019-06-07 VITALS — BP 118/59 | HR 80 | Temp 98.6°F | Ht 70.0 in | Wt 269.8 lb

## 2019-06-07 DIAGNOSIS — E559 Vitamin D deficiency, unspecified: Secondary | ICD-10-CM

## 2019-06-07 DIAGNOSIS — E7800 Pure hypercholesterolemia, unspecified: Secondary | ICD-10-CM

## 2019-06-07 DIAGNOSIS — G8114 Spastic hemiplegia affecting left nondominant side: Secondary | ICD-10-CM | POA: Diagnosis not present

## 2019-06-07 DIAGNOSIS — Z6838 Body mass index (BMI) 38.0-38.9, adult: Secondary | ICD-10-CM | POA: Diagnosis not present

## 2019-06-07 DIAGNOSIS — E785 Hyperlipidemia, unspecified: Secondary | ICD-10-CM | POA: Diagnosis not present

## 2019-06-07 DIAGNOSIS — L308 Other specified dermatitis: Secondary | ICD-10-CM | POA: Diagnosis not present

## 2019-06-07 DIAGNOSIS — I10 Essential (primary) hypertension: Secondary | ICD-10-CM

## 2019-06-07 DIAGNOSIS — I619 Nontraumatic intracerebral hemorrhage, unspecified: Secondary | ICD-10-CM

## 2019-06-07 DIAGNOSIS — L304 Erythema intertrigo: Secondary | ICD-10-CM

## 2019-06-07 LAB — POCT GLYCOSYLATED HEMOGLOBIN (HGB A1C): Hemoglobin A1C: 6.2 % — AB (ref 4.0–5.6)

## 2019-06-07 LAB — GLUCOSE, CAPILLARY: Glucose-Capillary: 170 mg/dL — ABNORMAL HIGH (ref 70–99)

## 2019-06-07 MED ORDER — BACLOFEN 10 MG PO TABS: 10.0000 mg | ORAL_TABLET | Freq: Two times a day (BID) | ORAL | 1 refills | Status: DC | PRN

## 2019-06-07 MED ORDER — NYSTATIN 100000 UNIT/GM EX POWD: 1.0000 "application " | Freq: Three times a day (TID) | CUTANEOUS | 3 refills | Status: DC

## 2019-06-07 NOTE — Progress Notes (Signed)
  Subjective:  HPI: [name] is a 55 y.o. male who presents for f/y of HTN He is accompanied by his wife Jeremy [name]  Please see Assessment and Plan below for the status of his chronic medical problems.  Objective:  Physical Exam: Vitals:   06/07/19 0825  Weight: 269 lb 12.8 oz (122.4 kg)   Body mass index is 38.71 kg/m. Physical Exam Constitutional:      Appearance: Normal appearance. He is obese.     Comments: In [facility]  Cardiovascular:     Rate and Rhythm: Normal rate and regular rhythm.  Pulmonary:     Effort: Pulmonary effort is normal.     Breath sounds: Normal breath sounds.  Skin:    Comments: intertrigo rash under abdomen, raw skin, 3 by 3cm wound  Neurological:     Mental Status: He is alert.  Psychiatric:        Mood and Affect: Mood normal.        Behavior: Behavior normal.    Assessment & Plan:  See Encounters Tab for problem based charting.  Medications Ordered No orders of the defined types were placed in this encounter.  Other Orders No orders of the defined types were placed in this encounter.  Follow Up: No follow-ups on file.

## 2019-06-08 LAB — CMP14 + ANION GAP
ALT: 18 IU/L (ref 0–44)
AST: 17 IU/L (ref 0–40)
Albumin/Globulin Ratio: 0.9 — ABNORMAL LOW (ref 1.2–2.2)
Albumin: 3.6 g/dL — ABNORMAL LOW (ref 3.8–4.9)
Alkaline Phosphatase: 123 IU/L — ABNORMAL HIGH (ref 39–117)
Anion Gap: 14 mmol/L (ref 10.0–18.0)
BUN/Creatinine Ratio: 13 (ref 9–20)
BUN: 12 mg/dL (ref 6–24)
Bilirubin Total: 0.3 mg/dL (ref 0.0–1.2)
CO2: 29 mmol/L (ref 20–29)
Calcium: 9.6 mg/dL (ref 8.7–10.2)
Chloride: 96 mmol/L (ref 96–106)
Creatinine, Ser: 0.89 mg/dL (ref 0.76–1.27)
GFR calc Af Amer: 113 mL/min/{1.73_m2} (ref >59)
GFR calc non Af Amer: 98 mL/min/{1.73_m2} (ref >59)
Globulin, Total: 4 g/dL (ref 1.5–4.5)
Glucose: 166 mg/dL — ABNORMAL HIGH (ref 65–99)
Potassium: 3.5 mmol/L (ref 3.5–5.2)
Sodium: 139 mmol/L (ref 134–144)
Total Protein: 7.6 g/dL (ref 6.0–8.5)

## 2019-06-08 LAB — VITAMIN D 25 HYDROXY (VIT D DEFICIENCY, FRACTURES): Vit D, 25-Hydroxy: 56.3 ng/mL (ref 30.0–100.0)

## 2019-06-08 NOTE — Assessment & Plan Note (Signed)
HPI: He reports he has been taking 2000 international units of vitamin D3 twice a day.  No complaints  Assessment vitamin D deficiency  Plan recheck vitamin D level which is now completely normal that encouraged him to continue either current regimen or switch to 5000 international units daily if that is easier.

## 2019-06-08 NOTE — Assessment & Plan Note (Signed)
HPI: He brings his home blood pressure readings is remarkably well controlled at home and continues to be well controlled here as well.  He brings all of his medications and reports he is taking them as directed.  Assessment essential hypertension well-controlled  Plan Continue his current medications which include chlorthalidone 50 mg daily metoprolol succinate 100 mg daily

## 2019-06-08 NOTE — Assessment & Plan Note (Signed)
HPI: Jeremy [name] reports that she had his rash completely healed with the nystatin however she had not been monitoring it as closely and it returned.  She thinks it may have returned because of the increased heat here now in the spring.  He does have a small wound on his lower abdomen.  He reports he has been using [place] and occasional triamcinolone.  Assessment intertriginous rash under pannus  Plan Discussed the difference between eczematous rash that needs triamcinolone and fungal rash which she needs that nystatin.  I have represcribed nystatin powder.  If this does not clear up in the next week I discussed we could use an oral antifungal agent given the extensiveness of the to get better control to promote wound healing.

## 2019-06-08 NOTE — Assessment & Plan Note (Signed)
HPI: He has gotten a new power wheelchair now he is doing well with that.  He feels like he is having some increased feeling of his left lower extremity which he hopes his assignment he has increased sensation.  He continues to try to work out his left upper extremity.  Assessment spastic hemiparesis about the left side after intracerebral hemorrhage  Continues to follow with PMNR we have continue baclofen which he reports has continued to help him.

## 2019-06-08 NOTE — Assessment & Plan Note (Signed)
He has a BMI of 38 associated with hypertension hyperlipidemia.  We have discussed the importance of weight loss which he has actually had some weight loss.  I have encouraged ongoing caloric reduction.

## 2019-06-09 ENCOUNTER — Ambulatory Visit: Payer: Medicare HMO

## 2019-06-10 DIAGNOSIS — I6789 Other cerebrovascular disease: Secondary | ICD-10-CM | POA: Diagnosis not present

## 2019-06-10 DIAGNOSIS — R32 Unspecified urinary incontinence: Secondary | ICD-10-CM | POA: Diagnosis not present

## 2019-06-10 DIAGNOSIS — G8114 Spastic hemiplegia affecting left nondominant side: Secondary | ICD-10-CM | POA: Diagnosis not present

## 2019-06-10 DIAGNOSIS — R1319 Other dysphagia: Secondary | ICD-10-CM | POA: Diagnosis not present

## 2019-06-14 ENCOUNTER — Ambulatory Visit: Payer: Medicare HMO | Admitting: Internal Medicine

## 2019-06-15 ENCOUNTER — Ambulatory Visit: Payer: Medicare HMO | Attending: Internal Medicine

## 2019-06-15 DIAGNOSIS — Z23 Encounter for immunization: Secondary | ICD-10-CM

## 2019-06-15 NOTE — Progress Notes (Signed)
   Covid-19 Vaccination Clinic  Name:  Jeremy [name]    MRN: [id number] DOB: 02/18/1965  06/15/2019  [name] was observed post Covid-19 immunization for 15 minutes without incident. He was provided with Vaccine Information Sheet and instruction to access the V-Safe system.   [name] was instructed to call 911 with any severe reactions post vaccine: Marland Kitchen Difficulty breathing  . Swelling of face and throat  . A fast heartbeat  . A bad rash all over body  . Dizziness and weakness   Immunizations Administered    Name Date Dose VIS Date Route   Pfizer COVID-19 Vaccine 06/15/2019 10:02 AM 0.3 mL 04/11/2018 Intramuscular   Manufacturer: [place]   Lot: [id number]   [place]: [id number]

## 2019-06-28 DIAGNOSIS — R32 Unspecified urinary incontinence: Secondary | ICD-10-CM | POA: Diagnosis not present

## 2019-06-28 DIAGNOSIS — I6789 Other cerebrovascular disease: Secondary | ICD-10-CM | POA: Diagnosis not present

## 2019-06-28 DIAGNOSIS — G8114 Spastic hemiplegia affecting left nondominant side: Secondary | ICD-10-CM | POA: Diagnosis not present

## 2019-06-28 DIAGNOSIS — R1319 Other dysphagia: Secondary | ICD-10-CM | POA: Diagnosis not present

## 2019-06-30 ENCOUNTER — Other Ambulatory Visit: Payer: Self-pay | Admitting: Internal Medicine

## 2019-06-30 DIAGNOSIS — G8114 Spastic hemiplegia affecting left nondominant side: Secondary | ICD-10-CM

## 2019-06-30 DIAGNOSIS — I619 Nontraumatic intracerebral hemorrhage, unspecified: Secondary | ICD-10-CM

## 2019-07-09 ENCOUNTER — Ambulatory Visit: Payer: Medicare HMO | Attending: Internal Medicine

## 2019-07-09 DIAGNOSIS — Z23 Encounter for immunization: Secondary | ICD-10-CM

## 2019-07-09 NOTE — Progress Notes (Signed)
   Covid-19 Vaccination Clinic  Name:  Jaivan Ure    MRN: [id number] DOB: [birth year]  07/09/2019  Mr. Trierweiler was observed post Covid-19 immunization for 15 minutes without incident. He was provided with Vaccine Information Sheet and instruction to access the V-Safe system.   Mr. Norland was instructed to call 911 with any severe reactions post vaccine: Marland Kitchen Difficulty breathing  . Swelling of face and throat  . A fast heartbeat  . A bad rash all over body  . Dizziness and weakness   Immunizations Administered    Name Date Dose VIS Date Route   Pfizer COVID-19 Vaccine 07/09/2019  9:46 AM 0.3 mL 04/11/2018 Intramuscular   Manufacturer: [place]   Lot: [id number]   [place]: [id number]

## 2019-07-10 DIAGNOSIS — R1319 Other dysphagia: Secondary | ICD-10-CM | POA: Diagnosis not present

## 2019-07-10 DIAGNOSIS — R32 Unspecified urinary incontinence: Secondary | ICD-10-CM | POA: Diagnosis not present

## 2019-07-10 DIAGNOSIS — G8114 Spastic hemiplegia affecting left nondominant side: Secondary | ICD-10-CM | POA: Diagnosis not present

## 2019-07-10 DIAGNOSIS — I6789 Other cerebrovascular disease: Secondary | ICD-10-CM | POA: Diagnosis not present

## 2019-07-29 DIAGNOSIS — R1319 Other dysphagia: Secondary | ICD-10-CM | POA: Diagnosis not present

## 2019-07-29 DIAGNOSIS — G8114 Spastic hemiplegia affecting left nondominant side: Secondary | ICD-10-CM | POA: Diagnosis not present

## 2019-07-29 DIAGNOSIS — R32 Unspecified urinary incontinence: Secondary | ICD-10-CM | POA: Diagnosis not present

## 2019-07-29 DIAGNOSIS — I6789 Other cerebrovascular disease: Secondary | ICD-10-CM | POA: Diagnosis not present

## 2019-08-10 DIAGNOSIS — G8114 Spastic hemiplegia affecting left nondominant side: Secondary | ICD-10-CM | POA: Diagnosis not present

## 2019-08-10 DIAGNOSIS — R1319 Other dysphagia: Secondary | ICD-10-CM | POA: Diagnosis not present

## 2019-08-10 DIAGNOSIS — I6789 Other cerebrovascular disease: Secondary | ICD-10-CM | POA: Diagnosis not present

## 2019-08-10 DIAGNOSIS — R32 Unspecified urinary incontinence: Secondary | ICD-10-CM | POA: Diagnosis not present

## 2019-08-23 ENCOUNTER — Encounter: Payer: Medicare HMO | Admitting: Internal Medicine

## 2019-08-28 DIAGNOSIS — G8114 Spastic hemiplegia affecting left nondominant side: Secondary | ICD-10-CM | POA: Diagnosis not present

## 2019-08-28 DIAGNOSIS — I6789 Other cerebrovascular disease: Secondary | ICD-10-CM | POA: Diagnosis not present

## 2019-08-28 DIAGNOSIS — R1319 Other dysphagia: Secondary | ICD-10-CM | POA: Diagnosis not present

## 2019-08-28 DIAGNOSIS — R32 Unspecified urinary incontinence: Secondary | ICD-10-CM | POA: Diagnosis not present

## 2019-08-30 ENCOUNTER — Encounter: Payer: Medicare HMO | Admitting: Internal Medicine

## 2019-09-05 ENCOUNTER — Other Ambulatory Visit: Payer: Self-pay | Admitting: *Deleted

## 2019-09-05 MED ORDER — CHLORTHALIDONE 50 MG PO TABS: 50.0000 mg | ORAL_TABLET | Freq: Every day | ORAL | 3 refills | Status: DC

## 2019-09-05 NOTE — Telephone Encounter (Signed)
Next appt scheduled 7/29 with PCP. 

## 2019-09-09 DIAGNOSIS — I6789 Other cerebrovascular disease: Secondary | ICD-10-CM | POA: Diagnosis not present

## 2019-09-09 DIAGNOSIS — R32 Unspecified urinary incontinence: Secondary | ICD-10-CM | POA: Diagnosis not present

## 2019-09-09 DIAGNOSIS — R1319 Other dysphagia: Secondary | ICD-10-CM | POA: Diagnosis not present

## 2019-09-09 DIAGNOSIS — G8114 Spastic hemiplegia affecting left nondominant side: Secondary | ICD-10-CM | POA: Diagnosis not present

## 2019-09-11 ENCOUNTER — Other Ambulatory Visit: Payer: Self-pay

## 2019-09-11 ENCOUNTER — Other Ambulatory Visit: Payer: Self-pay | Admitting: *Deleted

## 2019-09-11 MED ORDER — ATORVASTATIN CALCIUM 40 MG PO TABS: 40.0000 mg | ORAL_TABLET | Freq: Every day | ORAL | 3 refills | Status: DC

## 2019-09-11 NOTE — Telephone Encounter (Signed)
Requesting all meds to be filled @ [facility]. Please call pt back.

## 2019-09-12 ENCOUNTER — Other Ambulatory Visit: Payer: Self-pay | Admitting: *Deleted

## 2019-09-12 NOTE — Telephone Encounter (Signed)
Sent 2 refill request to dr Heber [place], everything has been going to [place], called pt and informed him

## 2019-09-13 ENCOUNTER — Other Ambulatory Visit: Payer: Self-pay

## 2019-09-13 ENCOUNTER — Ambulatory Visit: Payer: Medicare HMO | Admitting: Internal Medicine

## 2019-09-13 ENCOUNTER — Encounter: Payer: Self-pay | Admitting: Internal Medicine

## 2019-09-13 VITALS — BP 123/74 | HR 66 | Temp 98.9°F | Ht 70.0 in | Wt 267.3 lb

## 2019-09-13 DIAGNOSIS — K029 Dental caries, unspecified: Secondary | ICD-10-CM

## 2019-09-13 DIAGNOSIS — I10 Essential (primary) hypertension: Secondary | ICD-10-CM

## 2019-09-13 DIAGNOSIS — I619 Nontraumatic intracerebral hemorrhage, unspecified: Secondary | ICD-10-CM

## 2019-09-13 DIAGNOSIS — L304 Erythema intertrigo: Secondary | ICD-10-CM

## 2019-09-13 DIAGNOSIS — G8114 Spastic hemiplegia affecting left nondominant side: Secondary | ICD-10-CM

## 2019-09-13 MED ORDER — NYSTATIN 100000 UNIT/GM EX POWD: 1.0000 "application " | Freq: Three times a day (TID) | CUTANEOUS | 1 refills | Status: DC

## 2019-09-13 MED ORDER — METOPROLOL SUCCINATE ER 100 MG PO TB24: 100.0000 mg | ORAL_TABLET | Freq: Every day | ORAL | 3 refills | Status: DC

## 2019-09-13 NOTE — Assessment & Plan Note (Signed)
He reports to me that he will see his dentist tomorrow

## 2019-09-13 NOTE — Assessment & Plan Note (Signed)
Improved. Continue nystatin PRN

## 2019-09-13 NOTE — Progress Notes (Signed)
  Subjective:  HPI: [name] is a 54 y.o. male who presents for f/u HTN/ spastic hemiplegia of left side.  Please see Assessment and Plan below for the status of his chronic medical problems.  Objective:  Physical Exam: Vitals:   09/13/19 0845  BP: 123/74  Pulse: 66  Temp: 98.9 F (37.2 C)  TempSrc: Oral  SpO2: 96%  Weight: (!) 267 lb 4.8 oz (121.2 kg)  Height: 5\' 10"  (1.778 m)   Body mass index is 38.35 kg/m. Physical Exam Vitals and nursing note reviewed.  Constitutional:      Appearance: Normal appearance. He is obese.     Comments: In [facility]  Cardiovascular:     Rate and Rhythm: Normal rate and regular rhythm.  Pulmonary:     Effort: Pulmonary effort is normal.     Breath sounds: Normal breath sounds.  Neurological:     Mental Status: He is alert.  Psychiatric:        Mood and Affect: Mood normal.        Behavior: Behavior normal.    Assessment & Plan:  See Encounters Tab for problem based charting.  Medications Ordered No orders of the defined types were placed in this encounter.  Other Orders No orders of the defined types were placed in this encounter.  Follow Up: Return in about 6 months (around 03/15/2020).

## 2019-09-13 NOTE — Assessment & Plan Note (Signed)
HPI: He continues to check his blood pressure about once a day, he brings his log with him today.  On review blood pressure control with systolic pressures in the 389 and diastolics 37D.  He remains asymptomatic.  He is taking his blood pressure medications as directed.  Assessment essential hypertension well-controlled  Plan Continue chlorthalidone 50 mg daily metoprolol succinate 100 mg daily

## 2019-09-13 NOTE — Assessment & Plan Note (Signed)
He has lost about 15 pounds in the past year due to trying to eat less after he discussed prediabetes.  I congratulated him on his weight loss and encouraged him to continue dietary changes.

## 2019-09-13 NOTE — Assessment & Plan Note (Signed)
HPI: He actually notes some improvement in his muscle strength.  He has been trying to work out his extremities.  He asked me today about ankle weights.  He has also been trying to ambulate some to his mailbox with the use walker.  He does continue to use a wheelchair all of his ADLs.  He does inquire about personal care services to assist with washing close another house task.  Assessment spastic hemiparesis of left side following intracerebral hemorrhage  Plan Continue baclofen 10 mg twice daily as needed discussed he may off this spasticity. Has follow-up with PMR next month. Advised he may use ankle weights Discussed that without Medicaid will not qualify for personal care services

## 2019-09-18 ENCOUNTER — Telehealth: Payer: Self-pay | Admitting: *Deleted

## 2019-09-18 NOTE — Telephone Encounter (Signed)
Information was sent through CoverMyMeds for [place] for Nystatin Powder.Approved  02/16/2019 thru 02/15/2020.  Sander Nephew RN 09/18/2019 4:26 PM.

## 2019-09-28 DIAGNOSIS — R1319 Other dysphagia: Secondary | ICD-10-CM | POA: Diagnosis not present

## 2019-09-28 DIAGNOSIS — R32 Unspecified urinary incontinence: Secondary | ICD-10-CM | POA: Diagnosis not present

## 2019-09-28 DIAGNOSIS — I6789 Other cerebrovascular disease: Secondary | ICD-10-CM | POA: Diagnosis not present

## 2019-09-28 DIAGNOSIS — G8114 Spastic hemiplegia affecting left nondominant side: Secondary | ICD-10-CM | POA: Diagnosis not present

## 2019-10-01 ENCOUNTER — Telehealth: Payer: Self-pay | Admitting: *Deleted

## 2019-10-01 NOTE — Telephone Encounter (Signed)
This morning I received a call from [name] (DOB verified) and his wife [name] in reference to a letter from [employer].  Cologuard sent a reminder that it is time to repeat testing. Last performed 07/20/2016. Results scanned to Epic chart.  I told [name] that I would relay this message to Dr Joni Reining so he can review and determine the best colorectal cancer screening for Jeremy [name].  She understood and agreed.   I verified the call back number as [phone].    [facility] 10/01/2019  0915

## 2019-10-03 ENCOUNTER — Telehealth: Payer: Self-pay | Admitting: Internal Medicine

## 2019-10-03 DIAGNOSIS — Z1211 Encounter for screening for malignant neoplasm of colon: Secondary | ICD-10-CM

## 2019-10-03 NOTE — Telephone Encounter (Signed)
Called back, needed order for cologuard.  Placed order from within EMR.

## 2019-10-03 NOTE — Telephone Encounter (Signed)
Pt called, nurse informed him that dr Heber [place] tried to call pt and linda yesterday and nurse would have dr Heber [place] call them this pm when he is in clinic. Pt agreeable this r/t cologuard

## 2019-10-03 NOTE — Telephone Encounter (Signed)
Pt is requesting a call back [phone]

## 2019-10-05 ENCOUNTER — Other Ambulatory Visit: Payer: Self-pay | Admitting: Internal Medicine

## 2019-10-05 DIAGNOSIS — G8114 Spastic hemiplegia affecting left nondominant side: Secondary | ICD-10-CM

## 2019-10-05 DIAGNOSIS — I619 Nontraumatic intracerebral hemorrhage, unspecified: Secondary | ICD-10-CM

## 2019-10-10 DIAGNOSIS — R32 Unspecified urinary incontinence: Secondary | ICD-10-CM | POA: Diagnosis not present

## 2019-10-10 DIAGNOSIS — I6789 Other cerebrovascular disease: Secondary | ICD-10-CM | POA: Diagnosis not present

## 2019-10-10 DIAGNOSIS — R1319 Other dysphagia: Secondary | ICD-10-CM | POA: Diagnosis not present

## 2019-10-10 DIAGNOSIS — G8114 Spastic hemiplegia affecting left nondominant side: Secondary | ICD-10-CM | POA: Diagnosis not present

## 2019-10-17 DIAGNOSIS — Z1212 Encounter for screening for malignant neoplasm of rectum: Secondary | ICD-10-CM | POA: Diagnosis not present

## 2019-10-17 DIAGNOSIS — Z1211 Encounter for screening for malignant neoplasm of colon: Secondary | ICD-10-CM | POA: Diagnosis not present

## 2019-10-27 LAB — COLOGUARD: COLOGUARD: POSITIVE — AB

## 2019-10-29 DIAGNOSIS — I6789 Other cerebrovascular disease: Secondary | ICD-10-CM | POA: Diagnosis not present

## 2019-10-29 DIAGNOSIS — G8114 Spastic hemiplegia affecting left nondominant side: Secondary | ICD-10-CM | POA: Diagnosis not present

## 2019-10-29 DIAGNOSIS — R32 Unspecified urinary incontinence: Secondary | ICD-10-CM | POA: Diagnosis not present

## 2019-10-29 DIAGNOSIS — R1319 Other dysphagia: Secondary | ICD-10-CM | POA: Diagnosis not present

## 2019-10-31 ENCOUNTER — Other Ambulatory Visit: Payer: Self-pay

## 2019-10-31 ENCOUNTER — Encounter: Payer: Medicare HMO | Attending: Physical Medicine & Rehabilitation | Admitting: Physical Medicine & Rehabilitation

## 2019-10-31 ENCOUNTER — Encounter: Payer: Self-pay | Admitting: Physical Medicine & Rehabilitation

## 2019-10-31 VITALS — BP 114/77 | HR 81 | Temp 98.6°F | Ht 70.0 in | Wt 263.0 lb

## 2019-10-31 DIAGNOSIS — I619 Nontraumatic intracerebral hemorrhage, unspecified: Secondary | ICD-10-CM | POA: Diagnosis not present

## 2019-10-31 DIAGNOSIS — K029 Dental caries, unspecified: Secondary | ICD-10-CM | POA: Insufficient documentation

## 2019-10-31 DIAGNOSIS — G8114 Spastic hemiplegia affecting left nondominant side: Secondary | ICD-10-CM | POA: Insufficient documentation

## 2019-10-31 NOTE — Progress Notes (Signed)
Subjective:    Patient ID: Jeremy [name], male    DOB: 1965-03-19, 54 y.o.   MRN: [id number]  HPI'  Jeremy [name] is here in follow up of his thalamic hemorrhage. He continues to struggle with the management of his teeth. He saw someone recently who felt that it was more complex than he could handle. He's not having oral pain.   He is walking at home with his RW. He can walk to the mailbox. He uses his power chair outside the home. His left side is still numb. His spasms are under control except at night on occasion. He takes baclofen 10mg  bid.   Bowels and bladder are functioning. He is sleepint well except when he has spasms. Mood is up beat.      Pain Inventory Average Pain 0 Pain Right Now 0 My pain is burning and only left foot numbness & left muscle spasms  LOCATION OF PAIN  Left side body & left foot  BOWEL Number of stools per week: 7 Oral laxative use Yes  Type of laxative  - OTC pill Enema or suppository use No  History of colostomy No  Incontinent No   BLADDER Normal In and out cath, frequency - no Able to self cath No  Bladder incontinence No  Frequent urination Yes  Leakage with coughing No  Difficulty starting stream No  Incomplete bladder emptying No    Mobility use a walker how many minutes can you walk? 15 mins ability to climb steps?  yes do you drive?  no use a wheelchair Do you have any goals in this area?  yes  Function disabled: date disabled 2013  Neuro/Psych numbness tingling trouble walking spasms  Prior Studies Any changes since last visit?  no  Physicians involved in your care Any changes since last visit?  no   Family History  Problem Relation Age of Onset  . Hypertension Mother   . Heart disease Father   . Gout Father   . Hypertension Father    Social History   Socioeconomic History  . Marital status: Married    Spouse name: Not on file  . Number of children: 0  . Years of education: 12th  . Highest education  level: Not on file  Occupational History  . Occupation: retired    Fish farm manager: [employer]  Tobacco Use  . Smoking status: Never Smoker  . Smokeless tobacco: Never Used  Vaping Use  . Vaping Use: Never used  Substance and Sexual Activity  . Alcohol use: No    Alcohol/week: 0.0 standard drinks  . Drug use: No  . Sexual activity: Not on file  Other Topics Concern  . Not on file  Social History Narrative   Patient lives with his wife..and drinks sodas.   Social Determinants of Health   Financial Resource Strain:   . Difficulty of Paying Living Expenses: Not on file  Food Insecurity:   . Worried About Charity fundraiser in the Last Year: Not on file  . Ran Out of Food in the Last Year: Not on file  Transportation Needs:   . Lack of Transportation (Medical): Not on file  . Lack of Transportation (Non-Medical): Not on file  Physical Activity:   . Days of Exercise per Week: Not on file  . Minutes of Exercise per Session: Not on file  Stress:   . Feeling of Stress : Not on file  Social Connections:   . Frequency of Communication with Friends and  Family: Not on file  . Frequency of Social Gatherings with Friends and Family: Not on file  . Attends Religious Services: Not on file  . Active Member of Clubs or Organizations: Not on file  . Attends Archivist Meetings: Not on file  . Marital Status: Not on file   Past Surgical History:  Procedure Laterality Date  . TRACHEOSTOMY TUBE PLACEMENT  10/11/2011   Procedure: TRACHEOSTOMY;  Surgeon: Melida Quitter, MD;  Location: [place];  Service: ENT;  Laterality: N/A;   Past Medical History:  Diagnosis Date  . High cholesterol   . Hypertension 10/01/2011   basal ganglia  . ICH (intracerebral hemorrhage) ([place]) 10/01/2011   ICH in 09/2011.    . Stroke ([facility])    BP 114/77   Pulse 81   Temp 98.6 F (37 C)   Ht 5\' 10"  (1.778 m)   Wt 263 lb (119.3 kg) Comment: per pt in w/c  SpO2 95%   BMI 37.74 kg/m   Opioid Risk  Score:   Fall Risk Score:  `1  Depression screen PHQ 2/9  Depression screen [facility] 2/9 09/13/2019 06/07/2019 03/08/2019 02/27/2019 02/20/2019 12/14/2018 08/24/2018  Decreased Interest 0 0 0 0 0 0 0  Down, Depressed, Hopeless 0 0 0 0 0 0 0  PHQ - 2 Score 0 0 0 0 0 0 0  Altered sleeping - - 0 - - - -  Tired, decreased energy - - 0 - - - -  Change in appetite - - 0 - - - -  Feeling bad or failure about yourself  - - 0 - - - -  Trouble concentrating - - 0 - - - -  Moving slowly or fidgety/restless - - 0 - - - -  Suicidal thoughts - - 0 - - - -  PHQ-9 Score - - 0 - - - -  Difficult doing work/chores - - Not difficult at all - - - -  Some recent data might be hidden   Review of Systems  Constitutional: Negative.   HENT: Negative.   Eyes: Negative.   Respiratory: Negative.   Cardiovascular: Negative.   Gastrointestinal: Negative.   Endocrine: Negative.   Genitourinary: Negative.   Musculoskeletal: Positive for gait problem.  Skin: Negative.   Allergic/Immunologic: Negative.   Neurological: Positive for tremors and numbness.  Hematological: Negative.   Psychiatric/Behavioral: Negative.   All other systems reviewed and are negative.      Objective:   Physical Exam General: No acute distress HEENT: EOMI, oral membranes moist Cards: reg rate  Chest: normal effort Abdomen: Soft, NT, ND Skin: dry, intact Extremities: no edema Neuro:  dysarthric. Improved insight. .  Reflexes are 3+ on the left. No resting tone. 5/5 motor on the right. LUE and LLE 5/5. Sensory 1/2 LUE and LLE--stable Musculoskeletal:  pain along left lat and subscapularis, trap with palpation. No pain with ROM. Psych: pleasant and appropriate     Assessment & Plan:   ASSESSMENT: 1. Right BG/thalamic hemorrhage- he is making gradual progress  2. HTN  3. Dysphagia (resolved) .  4. Spastic left hemiparesis with intermittent spasms   5. Dental caries  6.  Left carpal tunnel syndrome.  Appears mild and superimposed upon  his left sided sensory deficits from his ICH   Plan:  1. Continue baclofen for spasms. DISCUSSED some alternative timing. May need to take second dose later. Consider increasing to 20mg  2. Dental work pending.  3. Tylenol for pain.   4.  continue left CTS splint. Sx improved 5. HEP was discussed.  6. 15 minutes of face to face patient care time were spent during this visit. All questions were encouraged and answered. 12 month f/u

## 2019-10-31 NOTE — Patient Instructions (Signed)
BACLOFEN  TAKE 10MG  AT 8 AM AND AT BEDTIME. IF IT STILL ISN'T HELPING YOUR NIGHT TIME SPASMS, THEN WE CAN PERHAPS INCREASE THE NIGHT TIME DOSE. JUST CALL ME.

## 2019-11-01 ENCOUNTER — Telehealth: Payer: Self-pay | Admitting: Internal Medicine

## 2019-11-01 DIAGNOSIS — R195 Other fecal abnormalities: Secondary | ICD-10-CM

## 2019-11-01 NOTE — Telephone Encounter (Signed)
Cologuard test was positive (collected 10/17/19) called [name] and [name] and infromed them of the positive.  Went over next steps that I will refer to Gastroenterology for consideration of diagnostic colonoscopy.

## 2019-11-05 ENCOUNTER — Telehealth: Payer: Self-pay | Admitting: Internal Medicine

## 2019-11-05 NOTE — Telephone Encounter (Signed)
Return pt's call; stated someone had called him about checking out his colon. GI referral in place, informed [facility] might had called him, telephone # given to pt.

## 2019-11-05 NOTE — Telephone Encounter (Signed)
Pls contact pt [phone]

## 2019-11-10 DIAGNOSIS — I6789 Other cerebrovascular disease: Secondary | ICD-10-CM | POA: Diagnosis not present

## 2019-11-10 DIAGNOSIS — R1319 Other dysphagia: Secondary | ICD-10-CM | POA: Diagnosis not present

## 2019-11-10 DIAGNOSIS — R32 Unspecified urinary incontinence: Secondary | ICD-10-CM | POA: Diagnosis not present

## 2019-11-10 DIAGNOSIS — G8114 Spastic hemiplegia affecting left nondominant side: Secondary | ICD-10-CM | POA: Diagnosis not present

## 2019-11-13 ENCOUNTER — Encounter: Payer: Self-pay | Admitting: *Deleted

## 2019-11-14 ENCOUNTER — Telehealth: Payer: Self-pay

## 2019-11-14 LAB — COLOGUARD

## 2019-11-14 NOTE — Telephone Encounter (Signed)
#  180 with 1 refill sent 10/08/2019. Patient made aware. He will contact [employer] for refill. Hubbard Hartshorn, BSN, RN-BC

## 2019-11-14 NOTE — Telephone Encounter (Signed)
baclofen (LIORESAL) 10 MG tablet, REFILL REQUEST @  [place], [place] Phone:  [phone]  Fax:  [phone]

## 2019-11-27 ENCOUNTER — Other Ambulatory Visit: Payer: Self-pay

## 2019-11-27 DIAGNOSIS — I619 Nontraumatic intracerebral hemorrhage, unspecified: Secondary | ICD-10-CM

## 2019-11-27 DIAGNOSIS — G8114 Spastic hemiplegia affecting left nondominant side: Secondary | ICD-10-CM

## 2019-11-27 MED ORDER — METOPROLOL SUCCINATE ER 100 MG PO TB24: 100.0000 mg | ORAL_TABLET | Freq: Every day | ORAL | 3 refills | Status: AC

## 2019-11-27 MED ORDER — NYSTATIN 100000 UNIT/GM EX POWD: 1.0000 "application " | Freq: Three times a day (TID) | CUTANEOUS | 1 refills | Status: DC

## 2019-11-27 MED ORDER — CHLORTHALIDONE 50 MG PO TABS: 50.0000 mg | ORAL_TABLET | Freq: Every day | ORAL | 3 refills | Status: DC

## 2019-11-27 MED ORDER — ATORVASTATIN CALCIUM 40 MG PO TABS: 40.0000 mg | ORAL_TABLET | Freq: Every day | ORAL | 3 refills | Status: AC

## 2019-11-27 MED ORDER — BACLOFEN 10 MG PO TABS: ORAL_TABLET | ORAL | 1 refills | Status: DC

## 2019-11-27 NOTE — Telephone Encounter (Signed)
Pt requesting the nurse to call [facility] pharmacy [phone].for refill. Pt do not know the name of the meds.

## 2019-11-27 NOTE — Telephone Encounter (Signed)
All selected RX's were recently sent to [facility]  Pt is requesting RX's be sent to [facility].  Fax request for selected RX's received from [facility]. Will forward to PCP. SChaplin, RN,BSN

## 2020-01-29 ENCOUNTER — Ambulatory Visit (AMBULATORY_SURGERY_CENTER): Payer: Self-pay | Admitting: *Deleted

## 2020-01-29 ENCOUNTER — Other Ambulatory Visit: Payer: Self-pay

## 2020-01-29 VITALS — Ht 70.0 in | Wt 268.0 lb

## 2020-01-29 DIAGNOSIS — R195 Other fecal abnormalities: Secondary | ICD-10-CM

## 2020-01-29 NOTE — Progress Notes (Signed)
Pt has a history of CVA in 2013.  He is in a scooter but was able to stand on the scale to weigh.  He states that he is able to transfer from chair to bed and to toilet.   No egg or soy allergy known to patient  No issues with past sedation with any surgeries or procedures No intubation problems in the past  No FH of Malignant Hyperthermia No diet pills per patient No home 02 use per patient  No blood thinners per patient  Pt denies issues with constipation  No A fib or A flutter  EMMI video to pt or via [place] 19 guidelines implemented in PV today with Pt and RN  Pt is fully vaccinated  for Covid    Due to the COVID-19 pandemic we are asking patients to follow certain guidelines.  Pt aware of COVID protocols and [facility] guidelines

## 2020-02-06 ENCOUNTER — Telehealth: Payer: Self-pay | Admitting: Gastroenterology

## 2020-02-06 NOTE — Telephone Encounter (Signed)
Good morning Dr. Rush Landmark, this patient called to reschedule his procedure from 02/12/20 to 02/26/20.  States he had other appointments he did not want to reschedule that day.

## 2020-02-11 NOTE — Telephone Encounter (Signed)
Thank you for update. GM 

## 2020-02-12 ENCOUNTER — Encounter: Payer: Medicare HMO | Admitting: Gastroenterology

## 2020-02-26 ENCOUNTER — Encounter: Payer: Self-pay | Admitting: Gastroenterology

## 2020-02-26 ENCOUNTER — Ambulatory Visit (AMBULATORY_SURGERY_CENTER): Payer: Medicare Other | Admitting: Gastroenterology

## 2020-02-26 ENCOUNTER — Other Ambulatory Visit: Payer: Self-pay

## 2020-02-26 VITALS — BP 146/75 | HR 80 | Temp 98.9°F | Resp 34 | Ht 70.0 in | Wt 268.0 lb

## 2020-02-26 DIAGNOSIS — D122 Benign neoplasm of ascending colon: Secondary | ICD-10-CM | POA: Diagnosis not present

## 2020-02-26 DIAGNOSIS — D125 Benign neoplasm of sigmoid colon: Secondary | ICD-10-CM | POA: Diagnosis not present

## 2020-02-26 DIAGNOSIS — R195 Other fecal abnormalities: Secondary | ICD-10-CM

## 2020-02-26 HISTORY — PX: COLONOSCOPY: SHX174

## 2020-02-26 MED ORDER — SODIUM CHLORIDE 0.9 % IV SOLN: 500.0000 mL | Freq: Once | INTRAVENOUS | Status: AC

## 2020-02-26 NOTE — Progress Notes (Signed)
Report to PACU, RN, vss, BBS= Clear.  

## 2020-02-26 NOTE — Op Note (Signed)
[facility] Patient Name: Jeremy [name] Procedure Date: 02/26/2020 11:23 AM MRN: [id number] Endoscopist: Justice Britain , MD Age: 56 Referring MD:  Date of Birth: 16-Jan-1966 Gender: Male Account #: [id number] Procedure:                Colonoscopy Indications:              Positive Cologuard test, This is the patient's                            first colonoscopy Medicines:                Monitored Anesthesia Care Procedure:                Pre-Anesthesia Assessment:                           - Prior to the procedure, a History and Physical                            was performed, and patient medications and                            allergies were reviewed. The patient's tolerance of                            previous anesthesia was also reviewed. The risks                            and benefits of the procedure and the sedation                            options and risks were discussed with the patient.                            All questions were answered, and informed consent                            was obtained. Prior Anticoagulants: The patient has                            taken no previous anticoagulant or antiplatelet                            agents except for aspirin. ASA Grade Assessment:                            III - A patient with severe systemic disease. After                            reviewing the risks and benefits, the patient was                            deemed in satisfactory condition to undergo the  procedure.                           After obtaining informed consent, the colonoscope                            was passed under direct vision. Throughout the                            procedure, the patient's blood pressure, pulse, and                            oxygen saturations were monitored continuously. The                            Olympus CF-HQ190 [id number]) Colonoscope was                             introduced through the anus and advanced to the the                            cecum, identified by appendiceal orifice and                            ileocecal valve. The colonoscopy was performed                            without difficulty. The patient tolerated the                            procedure. The quality of the bowel preparation was                            fair. The ileocecal valve, appendiceal orifice, and                            rectum were photographed. Scope In: 11:41:26 AM Scope Out: 12:10:47 PM Scope Withdrawal Time: 0 hours 26 minutes 55 seconds  Total Procedure Duration: 0 hours 29 minutes 21 seconds  Findings:                 The digital rectal exam findings include                            hemorrhoids. Pertinent negatives include no                            palpable rectal lesions.                           Copious quantities of liquid semi-liquid stool was                            found in the entire colon, interfering with  visualization. Lavage of the area was performed                            using copious amounts, resulting in clearance with                            only fair visualization throughout.                           Six sessile polyps were found in the sigmoid colon                            (1) and ascending colon (5). The polyps were 2 to 6                            mm in size. These polyps were removed with a cold                            snare. Resection and retrieval were complete.                           A 25 mm polyp was found in the sigmoid colon. The                            polyp was pedunculated with a large stalk and also                            changes of the underlying mucosa to be chicken-skin                            like (most likely from continued prolapse).                            Preparations were made for mucosal resection. NBI                            imaging and  White-light endoscopy was done to                            demarcate the borders of the lesion. A 1:100,000                            solution of epinephrine was injected to raise the                            lesion (total of 2 mL). Polyloop ligator was placed                            carefully over the polyp and onto the stalk deeply.                            Snare mucosal resection was performed. Resection  and retrieval were complete. To prevent bleeding                            after mucosal resection, one hemostatic clip was                            successfully placed (MR conditional) over the                            resection site and on top of the polyloop. There                            was no bleeding during, or at the end, of the                            procedure. Tattoo placed on contralateral wall for                            marking purposes.                           Non-bleeding non-thrombosed external and internal                            hemorrhoids were found during retroflexion, during                            perianal exam and during digital exam. The                            hemorrhoids were Grade II (internal hemorrhoids                            that prolapse but reduce spontaneously). Complications:            No immediate complications. Estimated Blood Loss:     Estimated blood loss was minimal. Impression:               - Preparation of the colon was only fair even after                            copious lavage.                           - Hemorrhoids found on digital rectal exam.                           - Stool in the entire examined colon.                           - Six 2 to 6 mm polyps in the sigmoid colon and in                            the ascending colon, removed with a cold snare.  Resected and retrieved.                           - One 25 mm polyp in the sigmoid colon,  removed                            with epinphrine injection and polyloop placement                            and then mucosal resection. Resected and retrieved.                            Clip (MR conditional) was placed. Tattoo placed on                            contralateral wall for marking purposes.                           - Non-bleeding non-thrombosed external and internal                            hemorrhoids. Recommendation:           - The patient will be observed post-procedure,                            until all discharge criteria are met.                           - Discharge patient to home.                           - Patient has a contact number available for                            emergencies. The signs and symptoms of potential                            delayed complications were discussed with the                            patient. Return to normal activities tomorrow.                            Written discharge instructions were provided to the                            patient.                           - High fiber diet.                           - Use FiberCon 1-2 tablets PO daily.                           -  Continue present medications.                           - Await pathology results.                           - Repeat colonoscopy wtihin next 6-12 months for                            continued evaluation of the colon due to the only                            fair preparation noted today. Will need Miralax                            daily for 1 week and then a 2-day preparation for                            next procedure.                           - The findings and recommendations were discussed                            with the patient.                           - The findings and recommendations were discussed                            with the patient's family. Justice Britain, MD 02/26/2020 12:21:02 PM

## 2020-02-26 NOTE — Progress Notes (Signed)
Pt's states no medical or surgical changes since previsit or office visit.  ° °Vitals CW °

## 2020-02-26 NOTE — Patient Instructions (Signed)
Please see handouts given to you on Polyps and High Fiber Diet. Use Fibercon 1-2 tablets daily. Repeat Colonoscopy in 6-12 Months. The office will call to schedule.    YOU HAD AN ENDOSCOPIC PROCEDURE TODAY AT [facility]:   Refer to the procedure report that was given to you for any specific questions about what was found during the examination.  If the procedure report does not answer your questions, please call your gastroenterologist to clarify.  If you requested that your care partner not be given the details of your procedure findings, then the procedure report has been included in a sealed envelope for you to review at your convenience later.  YOU SHOULD EXPECT: Some feelings of bloating in the abdomen. Passage of more gas than usual.  Walking can help get rid of the air that was put into your GI tract during the procedure and reduce the bloating. If you had a lower endoscopy (such as a colonoscopy or flexible sigmoidoscopy) you may notice spotting of blood in your stool or on the toilet paper. If you underwent a bowel prep for your procedure, you may not have a normal bowel movement for a few days.  Please Note:  You might notice some irritation and congestion in your nose or some drainage.  This is from the oxygen used during your procedure.  There is no need for concern and it should clear up in a day or so.  SYMPTOMS TO REPORT IMMEDIATELY:   Following lower endoscopy (colonoscopy or flexible sigmoidoscopy):  Excessive amounts of blood in the stool  Significant tenderness or worsening of abdominal pains  Swelling of the abdomen that is new, acute  Fever of 100F or higher   For urgent or emergent issues, a gastroenterologist can be reached at any hour by calling [phone]. Do not use MyChart messaging for urgent concerns.    DIET:  We do recommend a small meal at first, but then you may proceed to your regular diet.  Drink plenty of fluids but you should avoid  alcoholic beverages for 24 hours.  ACTIVITY:  You should plan to take it easy for the rest of today and you should NOT DRIVE or use heavy machinery until tomorrow (because of the sedation medicines used during the test).    FOLLOW UP: Our staff will call the number listed on your records 48-72 hours following your procedure to check on you and address any questions or concerns that you may have regarding the information given to you following your procedure. If we do not reach you, we will leave a message.  We will attempt to reach you two times.  During this call, we will ask if you have developed any symptoms of COVID 19. If you develop any symptoms (ie: fever, flu-like symptoms, shortness of breath, cough etc.) before then, please call [phone].  If you test positive for Covid 19 in the 2 weeks post procedure, please call and report this information to [place].    If any biopsies were taken you will be contacted by phone or by letter within the next 1-3 weeks.  Please call us at [phone] if you have not heard about the biopsies in 3 weeks.    SIGNATURES/CONFIDENTIALITY: You and/or your care partner have signed paperwork which will be entered into your electronic medical record.  These signatures attest to the fact that that the information above on your After Visit Summary has been reviewed and is understood.  Full responsibility  of the confidentiality of this discharge information lies with you and/or your care-partner.

## 2020-02-26 NOTE — Progress Notes (Signed)
Called to room to assist during endoscopic procedure.  Patient ID and intended procedure confirmed with present staff. Received instructions for my participation in the procedure from the performing physician.  polyloop placed around sigmoid polyp per DO

## 2020-02-28 ENCOUNTER — Telehealth: Payer: Self-pay

## 2020-02-28 NOTE — Telephone Encounter (Signed)
  Follow up Call-  Call back number 02/26/2020  Post procedure Call Back phone  # [phone]  Permission to leave phone message Yes  Some recent data might be hidden     Patient questions:  Do you have a fever, pain , or abdominal swelling? No. Pain Score  0 *  Have you tolerated food without any problems? Yes.    Have you been able to return to your normal activities? Yes.    Do you have any questions about your discharge instructions: Diet   No. Medications  No. Follow up visit  No.  Do you have questions or concerns about your Care? No.  Actions: * If pain score is 4 or above: No action needed, pain <4.  1. Have you developed a fever since your procedure? no  2.   Have you had an respiratory symptoms (SOB or cough) since your procedure? no  3.   Have you tested positive for COVID 19 since your procedure no  4.   Have you had any family members/close contacts diagnosed with the COVID 19 since your procedure?  no   If yes to any of these questions please route to Joylene John, RN and Joella Prince, RN

## 2020-02-28 NOTE — Telephone Encounter (Signed)
Left message on follow up call. 

## 2020-02-28 NOTE — Telephone Encounter (Signed)
Patient returned your follow-up call after his procedure. He stated to be feeling fine.  

## 2020-03-04 ENCOUNTER — Encounter: Payer: Self-pay | Admitting: Gastroenterology

## 2020-06-02 ENCOUNTER — Other Ambulatory Visit: Payer: Self-pay

## 2020-06-03 MED ORDER — NYSTATIN 100000 UNIT/GM EX POWD
1.0000 | Freq: Three times a day (TID) | CUTANEOUS | 1 refills | Status: DC
Start: 2020-06-03 — End: 2020-06-17

## 2020-06-17 ENCOUNTER — Encounter: Payer: Self-pay | Admitting: *Deleted

## 2020-06-17 NOTE — Progress Notes (Signed)

## 2020-06-18 ENCOUNTER — Encounter: Payer: Medicare Other | Admitting: Nurse Practitioner

## 2020-06-18 ENCOUNTER — Other Ambulatory Visit: Payer: Self-pay

## 2020-06-18 ENCOUNTER — Encounter: Payer: Self-pay | Admitting: Nurse Practitioner

## 2020-06-30 NOTE — Progress Notes (Signed)
Things That May Be Affecting Your Health:  Alcohol  Hearing loss  Pain    Depression  Home Safety  Sexual Health   Diabetes  Lack of physical activity  Stress  x Difficulty with daily activities  Loneliness  Tiredness   Drug use  Medicines  Tobacco use   Falls  Motor Vehicle Safety x Weight   Food choices x Oral Health  Other    YOUR PERSONALIZED HEALTH PLAN : 1. Schedule your next subsequent Medicare Wellness visit in one year 2. Attend all of your regular appointments to address your medical issues 3. Complete the preventative screenings and services   Annual Wellness Visit   Medicare Covered Preventative Screenings and [place] Men and Women Who How Often Need? Date of Last Service Action  Abdominal Aortic Aneurysm Adults with AAA risk factors Once n     Alcohol Misuse and Counseling All Adults Screening once a year if no alcohol misuse. Counseling up to 4 face to face sessions. y    Bone Density Measurement  Adults at risk for osteoporosis Once every 2 yrs n     Lipid Panel Z13.6 All adults without CV disease Once every 5 yrs n   2020   Colorectal Cancer   Stool sample or  Colonoscopy All adults 41 and older   Once every year  Every 10 years n   2022    Depression All Adults Once a year  Today   Diabetes Screening Blood glucose, post glucose load, or GTT Z13.1  All adults at risk  Pre-diabetics  Once per year  Twice per year y     Diabetes  Self-Management Training All adults Diabetics 10 hrs first year; 2 hours subsequent years. Requires Copay n    Glaucoma  Diabetics  Family history of glaucoma  African Americans 19 yrs +  Hispanic Americans 43 yrs + Annually - requires coppay y     Hepatitis C Z72.89 or F19.20  High Risk for HCV  Born between 1945 and 1965  Annually  Once n     HIV Z11.4 All adults based on risk  Annually btw ages 72 & 90 regardless of risk  Annually > 60 yrs if at increased risk y     Lung  Cancer Screening Asymptomatic adults aged 84-77 with 30 pack yr history and current smoker OR quit within the last 15 yrs Annually Must have counseling and shared decision making documentation before first screen n     Medical Nutrition Therapy Adults with   Diabetes  Renal disease  Kidney transplant within past 3 yrs 3 hours first year; 2 hours subsequent years n    Obesity and Counseling All adults Screening once a year Counseling if BMI 30 or higher  Today   Tobacco Use Counseling Adults who use tobacco  Up to 8 visits in one year     Vaccines Z23  Hepatitis B  Influenza   Pneumonia  Adults   Once  Once every flu season  Two different vaccines separated by one year n    Next Annual Wellness Visit People with Medicare Every year  Today     Services & Screenings Women Who How Often Need  Date of Last Service Action  Mammogram  Z12.31 Women over 75 One baseline ages 68-39. Annually ager 40 yrs+      Pap tests All women Annually if high risk. Every 2 yrs for normal risk women  Screening for cervical cancer with   Pap (Z01.419 nl or Z01.411abnl) &  HPV Z11.51 Women aged 71 to 74 Once every 5 yrs     Screening pelvic and breast exams All women Annually if high risk. Every 2 yrs for normal risk women     Sexually Transmitted Diseases  Chlamydia  Gonorrhea  Syphilis All at risk adults Annually for non pregnant females at increased risk         [place] Men Who How Ofter Need  Date of Last Service Action  Prostate Cancer - DRE & PSA Men over 50 Annually.  DRE might require a copay.        Sexually Transmitted Diseases  Syphilis All at risk adults Annually for men at increased risk      Health Maintenance List Health Maintenance  Topic Date Due  . HIV Screening  Never done  . Hepatitis C Screening  Never done  . COVID-19 Vaccine (3 - Booster for Pfizer series) 12/09/2019  . INFLUENZA VACCINE  09/15/2020  . COLONOSCOPY (Pts 45-8yrs  Insurance coverage will need to be confirmed)  02/25/2021  . TETANUS/TDAP  02/02/2022  . HPV VACCINES  Aged Out

## 2020-08-27 ENCOUNTER — Encounter: Payer: Self-pay | Admitting: Gastroenterology

## 2020-10-29 ENCOUNTER — Encounter
Payer: BC Managed Care – PPO | Attending: Physical Medicine & Rehabilitation | Admitting: Physical Medicine & Rehabilitation

## 2020-10-29 ENCOUNTER — Encounter: Payer: Self-pay | Admitting: Physical Medicine & Rehabilitation

## 2020-10-29 ENCOUNTER — Other Ambulatory Visit: Payer: Self-pay

## 2020-10-29 VITALS — BP 129/86 | HR 82 | Temp 99.0°F | Ht 70.0 in | Wt 247.0 lb

## 2020-10-29 DIAGNOSIS — G8114 Spastic hemiplegia affecting left nondominant side: Secondary | ICD-10-CM | POA: Diagnosis not present

## 2020-10-29 DIAGNOSIS — I619 Nontraumatic intracerebral hemorrhage, unspecified: Secondary | ICD-10-CM | POA: Diagnosis present

## 2020-10-29 NOTE — Progress Notes (Signed)
Subjective:    Patient ID: Jeremy [name], male    DOB: 10-18-1965, 55 y.o.   MRN: [id number]  HPI  Jeremy [name] is here in follow-up of his right basal ganglia/thalamic hemorrhage.  I last saw him in September of last year. He tells me he's been doing fairly well. He feels that he's had more strength over the last few months. He's using a hand and foot bike which has helped. He also walks to the mailbox or if he goes to store.   He has a dental appt pending finally!  He reports some occasional spasms in the left calf. .   Pain Inventory Average Pain 2 Pain Right Now 2 My pain is intermittent, burning, and tingling  LOCATION OF PAIN  Left side from shoulder down to toe, backside of left leg pain  BOWEL Number of stools per week: 7 Oral laxative use Yes  Type of laxative miralax Enema or suppository use No  History of colostomy No  Incontinent No   BLADDER Normal    Mobility walk with assistance use a cane use a walker how many minutes can you walk? 5-1 minutes ability to climb steps?  no do you drive?  no use a wheelchair needs help with transfers Do you have any goals in this area?  yes  Function disabled: date disabled 2013 I need assistance with the following:  dressing Do you have any goals in this area?  yes  Neuro/Psych weakness numbness tingling trouble walking spasms  Prior Studies Any changes since last visit?  no  Physicians involved in your care Any changes since last visit?  yes [employer]   Family History  Problem Relation Age of Onset   Hypertension Mother    Heart disease Father    Gout Father    Hypertension Father    Colon cancer Neg Hx    Colon polyps Neg Hx    Stomach cancer Neg Hx    Esophageal cancer Neg Hx    Social History   Socioeconomic History   Marital status: Married    Spouse name: Not on file   Number of children: 0   Years of education: 12th   Highest education level: Not on file  Occupational History    Occupation: retired    Fish farm manager: [employer]  Tobacco Use   Smoking status: Never   Smokeless tobacco: Never  Vaping Use   Vaping Use: Never used  Substance and Sexual Activity   Alcohol use: No    Alcohol/week: 0.0 standard drinks   Drug use: No   Sexual activity: Not on file  Other Topics Concern   Not on file  Social History Narrative   Diet      Do you drink/eat things with caffeine Coffee      Marital Status Married What year were you married? 03/17/1997      Do you live in a house, apartment, assisted living, condo, trailer, etc.? House      Is it one or more stories? One level      How many persons live in your home?  2        Do you have any pets in your home?(please list) No      Highest level of education completed: 12th Grade Jeremy [name]      Current or past profession: [employer]      Do you exercise?: Yes    Type and how often: Exercise bicycle everyday  Do you have a Living Will? (Form that indicates scenarios where you would not want your life prolonged)      Do you have a DNR form?         If not, would you like to discuss one? No      Do you have signed POA/HPOA forms? No      Do you have difficulty bathing or dressing yourself? No      Do you have difficulty preparing food or eating? No      Do you have difficulty managing medications? No      Do you have difficulty managing your finances? No      Do you have difficulty affording your medications? No                  Social Determinants of Health   Financial Resource Strain: Not on file  Food Insecurity: Not on file  Transportation Needs: Not on file  Physical Activity: Not on file  Stress: Not on file  Social Connections: Not on file   Past Surgical History:  Procedure Laterality Date   COLONOSCOPY  02/26/2020   [place]  2000   Per [facility] new patient packet   TRACHEOSTOMY TUBE PLACEMENT  10/11/2011   Procedure:  TRACHEOSTOMY;  Surgeon: Melida Quitter, MD;  Location: [place];  Service: ENT;  Laterality: N/A;   Past Medical History:  Diagnosis Date   Diabetes ([place])    Per [facility] new patient packet   High cholesterol    Hypertension 10/01/2011   basal ganglia   ICH (intracerebral hemorrhage) ([place]) 10/01/2011   ICH in 09/2011.     Stroke ([place])    2013   BP 129/86   Pulse 82   Temp 99 F (37.2 C)   Ht '5\' 10"'$  (1.778 m)   Wt 247 lb (112 kg)   SpO2 96%   BMI 35.44 kg/m   Opioid Risk Score:   Fall Risk Score:  `1  Depression screen PHQ 2/9  Depression screen [facility] 2/9 10/31/2019 09/13/2019 06/07/2019 03/08/2019 02/27/2019 02/20/2019 12/14/2018  Decreased Interest 1 0 0 0 0 0 0  Down, Depressed, Hopeless 1 0 0 0 0 0 0  PHQ - 2 Score 2 0 0 0 0 0 0  Altered sleeping - - - 0 - - -  Tired, decreased energy - - - 0 - - -  Change in appetite - - - 0 - - -  Feeling bad or failure about yourself  - - - 0 - - -  Trouble concentrating - - - 0 - - -  Moving slowly or fidgety/restless - - - 0 - - -  Suicidal thoughts - - - 0 - - -  PHQ-9 Score - - - 0 - - -  Difficult doing work/chores - - - Not difficult at all - - -  Some recent data might be hidden    Review of Systems  Musculoskeletal:  Positive for gait problem.       Left hamstring pain, left side pain from shoulders down to toes  Neurological:  Positive for weakness.       Tingling  Psychiatric/Behavioral:         Memory issues  All other systems reviewed and are negative.     Objective:   Physical Exam  General: No acute distress, morbid obesity HEENT: NCAT, EOMI, oral membranes moist Cards: reg rate  Chest: normal effort Abdomen: Soft, NT, ND  Skin: dry, intact Extremities: no edema Psych: pleasant and appropriate  Neuro:  dysarthric. Improved insight. .  Reflexes are 3+ on the left. No resting tone. 5/5 motor on the right. LUE and LLE 5/5. Sensory 1/2 LUE and LLE--still with decreased fmc, mild limb ataxia. Has good standing and ambulatory  posture with RW when he takes his time. Musculoskeletal:  Full ROM, No pain with AROM or PROM in the neck, trunk, or extremities. Posture appropriate        Assessment & Plan:   ASSESSMENT: 1. Right BG/thalamic hemorrhage-   2. HTN  3. Dysphagia (resolved) .  4. Spastic left hemiparesis with intermittent spasms   5. Dental caries  6.  Left carpal tunnel syndrome.  Appears mild and superimposed upon his left sided sensory deficits from his ICH   Plan:  1. Continue baclofen for spasms. Seem controlled at present. 2. Dental work pending.  3. Tylenol for pain.   4. continue left CTS splint.   5. Needs to be more active. Challenged him to walk every day.  6. 15 minutes of face to face patient care time were spent during this visit. All questions were encouraged and answered. 12 month f/u

## 2020-10-29 NOTE — Patient Instructions (Signed)
PLEASE FEEL FREE TO CALL OUR OFFICE WITH ANY PROBLEMS OR QUESTIONS ([phone])      

## 2021-10-21 ENCOUNTER — Encounter: Payer: Medicare Other | Attending: Physical Medicine & Rehabilitation | Admitting: Physical Medicine & Rehabilitation

## 2021-10-21 ENCOUNTER — Ambulatory Visit: Payer: BC Managed Care – PPO | Admitting: Physical Medicine & Rehabilitation

## 2021-10-21 ENCOUNTER — Encounter: Payer: Self-pay | Admitting: Physical Medicine & Rehabilitation

## 2021-10-21 VITALS — BP 143/88 | HR 77 | Ht 70.0 in

## 2021-10-21 DIAGNOSIS — I619 Nontraumatic intracerebral hemorrhage, unspecified: Secondary | ICD-10-CM

## 2021-10-21 DIAGNOSIS — M792 Neuralgia and neuritis, unspecified: Secondary | ICD-10-CM

## 2021-10-21 DIAGNOSIS — M25512 Pain in left shoulder: Secondary | ICD-10-CM

## 2021-10-21 DIAGNOSIS — G8929 Other chronic pain: Secondary | ICD-10-CM

## 2021-10-21 DIAGNOSIS — G8114 Spastic hemiplegia affecting left nondominant side: Secondary | ICD-10-CM

## 2021-10-21 NOTE — Patient Instructions (Addendum)
FOR LEFT SIDED NERVE PAIN:  EXERCISE MASSAGE AND STRETCHING HEATING PAD, WARM SHOWER FIND SOMETHING TO DISTRACT YOU FROM PAIN (?MUSIC, TV, TALKING TO A FRIEND) TOPICAL REMEDIES: BIOFREEZE, ICY HOT , SALONPAS, TIGER BALM, BLUE EMU CREAM.

## 2021-10-21 NOTE — Progress Notes (Signed)
Subjective:    Patient ID: Jeremy [name], male    DOB: 1965-06-30, 56 y.o.   MRN: [id number]  HPI  Jeremy [name] is here in follow up of his right thalamic hemorrhage. He states that things have been going pretty well at home. He is walking about 30 minutes daily outside with his rolling walker. He is sleeping well. Mood has been positive. Bowels and bladder are functioning normally. He's had no mishaps or falls. He has been trying to eat more healthy as a whole.   [name] has been complaining of left sided thigh and shoulder pain for a few months. He says it's usually better after he exercises. He uses ben gay which seems to help reduce symptoms. The pain is an aching, sometimes pins and needles pain  He will occasionally have spasms but they are generally controlled with the baclofen he uses.   Pain Inventory Average Pain 5 Pain Right Now 5 My pain is burning  LOCATION OF PAIN  shoulder, fingers, leg  BOWEL Number of stools per week: 4 Oral laxative use Yes  Type of laxative dulcolax Enema or suppository use No  History of colostomy No  Incontinent No   BLADDER Normal In and out cath, frequency . Able to self cath  . Bladder incontinence No  Frequent urination No  Leakage with coughing No  Difficulty starting stream No  Incomplete bladder emptying No    Mobility use a walker ability to climb steps?  no do you drive?  no  Function disabled: date disabled .  Neuro/Psych weakness numbness trouble walking  Prior Studies Any changes since last visit?  no  Physicians involved in your care Any changes since last visit?  yes   Family History  Problem Relation Age of Onset   Hypertension Mother    Heart disease Father    Gout Father    Hypertension Father    Colon cancer Neg Hx    Colon polyps Neg Hx    Stomach cancer Neg Hx    Esophageal cancer Neg Hx    Social History   Socioeconomic History   Marital status: Married    Spouse name: Not on file    Number of children: 0   Years of education: 12th   Highest education level: Not on file  Occupational History   Occupation: retired    Fish farm manager: [place]  Tobacco Use   Smoking status: Never   Smokeless tobacco: Never  Vaping Use   Vaping Use: Never used  Substance and Sexual Activity   Alcohol use: No    Alcohol/week: 0.0 standard drinks of alcohol   Drug use: No   Sexual activity: Not on file  Other Topics Concern   Not on file  Social History Narrative   Diet      Do you drink/eat things with caffeine Coffee      Marital Status Married What year were you married? 03/17/1997      Do you live in a house, apartment, assisted living, condo, trailer, etc.? House      Is it one or more stories? One level      How many persons live in your home?  2        Do you have any pets in your home?(please list) No      Highest level of education completed: 12th Grade Jeremy [name]      Current or past profession: [employer]      Do you  exercise?: Yes    Type and how often: Exercise bicycle everyday      Do you have a Living Will? (Form that indicates scenarios where you would not want your life prolonged)      Do you have a DNR form?         If not, would you like to discuss one? No      Do you have signed POA/HPOA forms? No      Do you have difficulty bathing or dressing yourself? No      Do you have difficulty preparing food or eating? No      Do you have difficulty managing medications? No      Do you have difficulty managing your finances? No      Do you have difficulty affording your medications? No                  Social Determinants of Health   Financial Resource Strain: Not on file  Food Insecurity: Not on file  Transportation Needs: Not on file  Physical Activity: Not on file  Stress: Not on file  Social Connections: Not on file   Past Surgical History:  Procedure Laterality Date   COLONOSCOPY  02/26/2020   [place]  2000   Per [facility] new patient packet   TRACHEOSTOMY TUBE PLACEMENT  10/11/2011   Procedure: TRACHEOSTOMY;  Surgeon: Melida Quitter, MD;  Location: [facility];  Service: ENT;  Laterality: N/A;   Past Medical History:  Diagnosis Date   Diabetes ([place])    Per [facility] new patient packet   High cholesterol    Hypertension 10/01/2011   basal ganglia   ICH (intracerebral hemorrhage) ([place]) 10/01/2011   ICH in 09/2011.     Stroke ([place])    2013   BP (!) 143/88   Pulse 77   Ht '5\' 10"'$  (1.778 m)   SpO2 99%   BMI 35.44 kg/m   Opioid Risk Score:   Fall Risk Score:  `1  Depression screen [facility] 2/9     10/29/2020   10:42 AM 10/31/2019   10:50 AM 09/13/2019    8:48 AM 06/07/2019    8:27 AM 03/08/2019    1:35 PM 02/27/2019    8:57 AM 02/20/2019    8:54 AM  Depression screen PHQ 2/9  Decreased Interest 0 1 0 0 0 0 0  Down, Depressed, Hopeless 0 1 0 0 0 0 0  PHQ - 2 Score 0 2 0 0 0 0 0  Altered sleeping     0    Tired, decreased energy     0    Change in appetite     0    Feeling bad or failure about yourself      0    Trouble concentrating     0    Moving slowly or fidgety/restless     0    Suicidal thoughts     0    PHQ-9 Score     0    Difficult doing work/chores     Not difficult at all       Review of Systems  Musculoskeletal:  Positive for gait problem.       Shoulder, finger, leg pain  Neurological:  Positive for weakness and numbness.  All other systems reviewed and are negative.     Objective:   Physical Exam  General: No acute distress, obese HEENT: NCAT, EOMI, oral membranes moist Cards:  reg rate  Chest: normal effort Abdomen: Soft, NT, ND Skin: dry, intact Extremities: no edema Psych: pleasant and appropriate  Neuro:  dysarthric. Improved insight. .  Reflexes are 3+ on the left. No resting tone. 5/5 motor on the right. LUE and LLE 5/5. Sensory 1/2 LUE and LLE-unchanged, mild limb ataxia still present. Has good standing and ambulatory posture with RW  although still favors the left side in weight bearing Musculoskeletal:  Full ROM, No pain with AROM or PROM in the neck, trunk, or extremities. Posture appropriate . NO RTC signs on left       Assessment & Plan:   ASSESSMENT: 1. Right BG/thalamic hemorrhage-   2. HTN  3. Dysphagia (resolved) .  4. Spastic left hemiparesis with intermittent spasms  new neuropathic pain 5. Dental caries --still waiting on getting this done 6.  Left carpal tunnel syndrome.  Appears mild and superimposed upon his left sided sensory deficits from his ICH   Plan:  1. Continue baclofen for spasms. Seem controlled at present. 2.  gave him some remedies to deal with neuro pain on left side including:  -exercise, massage, stretching  -heating pad, warm shower  -topical remedies  -distraction with music, etc  -consider gabapentin trial if needed.   -left RTC appears fine 3. Tylenol for general pain control   4. continue left CTS splint.   5. I am pleased that he's more active and walking daily!     Twenty minutes of face to face patient care time were spent during this visit. All questions were encouraged and answered.  Follow up with me in 1 year .

## 2021-10-28 ENCOUNTER — Ambulatory Visit: Payer: BC Managed Care – PPO | Admitting: Physical Medicine & Rehabilitation

## 2022-03-15 ENCOUNTER — Other Ambulatory Visit (HOSPITAL_COMMUNITY): Payer: Self-pay | Admitting: Student

## 2022-03-15 DIAGNOSIS — I509 Heart failure, unspecified: Secondary | ICD-10-CM

## 2022-03-15 DIAGNOSIS — I110 Hypertensive heart disease with heart failure: Secondary | ICD-10-CM

## 2022-03-29 ENCOUNTER — Ambulatory Visit (HOSPITAL_COMMUNITY)
Admission: RE | Admit: 2022-03-29 | Discharge: 2022-03-29 | Disposition: A | Discharge status: Home / Self Care | Payer: 59 | Source: Ambulatory Visit | Attending: Student | Admitting: Student

## 2022-03-29 DIAGNOSIS — I509 Heart failure, unspecified: Secondary | ICD-10-CM | POA: Diagnosis not present

## 2022-03-29 DIAGNOSIS — I351 Nonrheumatic aortic (valve) insufficiency: Secondary | ICD-10-CM | POA: Insufficient documentation

## 2022-03-29 DIAGNOSIS — I110 Hypertensive heart disease with heart failure: Secondary | ICD-10-CM | POA: Diagnosis present

## 2022-03-29 DIAGNOSIS — E119 Type 2 diabetes mellitus without complications: Secondary | ICD-10-CM | POA: Diagnosis not present

## 2022-03-29 LAB — ECHOCARDIOGRAM COMPLETE
Area-P 1/2: 4.8 cm2
Calc EF: 57.5 %
P 1/2 time: 765 msec
S' Lateral: 2.4 cm
Single Plane A2C EF: 56.1 %
Single Plane A4C EF: 59.8 %

## 2022-03-29 NOTE — Progress Notes (Signed)
Echocardiogram 2D Echocardiogram has been performed.  Jeremy [name] 03/29/2022, 12:01 PM

## 2022-06-21 ENCOUNTER — Encounter: Payer: Self-pay | Admitting: Student

## 2022-06-22 ENCOUNTER — Encounter: Payer: Self-pay | Admitting: Student

## 2022-06-22 ENCOUNTER — Other Ambulatory Visit: Payer: Self-pay | Admitting: Student

## 2022-06-22 DIAGNOSIS — G8114 Spastic hemiplegia affecting left nondominant side: Secondary | ICD-10-CM

## 2022-06-22 DIAGNOSIS — R0989 Other specified symptoms and signs involving the circulatory and respiratory systems: Secondary | ICD-10-CM

## 2022-07-23 ENCOUNTER — Other Ambulatory Visit: Payer: 59

## 2022-08-02 ENCOUNTER — Ambulatory Visit
Admission: RE | Admit: 2022-08-02 | Discharge: 2022-08-02 | Disposition: A | Discharge status: Home / Self Care | Payer: 59 | Source: Ambulatory Visit | Attending: Student | Admitting: Student

## 2022-08-02 ENCOUNTER — Other Ambulatory Visit: Payer: 59

## 2022-08-02 DIAGNOSIS — R0989 Other specified symptoms and signs involving the circulatory and respiratory systems: Secondary | ICD-10-CM

## 2022-08-02 DIAGNOSIS — G8114 Spastic hemiplegia affecting left nondominant side: Secondary | ICD-10-CM

## 2022-10-27 ENCOUNTER — Encounter: Payer: Self-pay | Admitting: Physical Medicine & Rehabilitation

## 2022-10-27 ENCOUNTER — Encounter: Payer: 59 | Attending: Physical Medicine & Rehabilitation | Admitting: Physical Medicine & Rehabilitation

## 2022-10-27 VITALS — BP 139/90 | HR 88 | Ht 70.0 in | Wt 262.2 lb

## 2022-10-27 DIAGNOSIS — M79652 Pain in left thigh: Secondary | ICD-10-CM | POA: Insufficient documentation

## 2022-10-27 DIAGNOSIS — I619 Nontraumatic intracerebral hemorrhage, unspecified: Secondary | ICD-10-CM | POA: Insufficient documentation

## 2022-10-27 DIAGNOSIS — G8114 Spastic hemiplegia affecting left nondominant side: Secondary | ICD-10-CM | POA: Insufficient documentation

## 2022-10-27 DIAGNOSIS — I610 Nontraumatic intracerebral hemorrhage in hemisphere, subcortical: Secondary | ICD-10-CM | POA: Diagnosis present

## 2022-10-27 NOTE — Patient Instructions (Signed)
ALWAYS FEEL FREE TO CALL OUR OFFICE WITH ANY PROBLEMS OR QUESTIONS [phone])  **PLEASE NOTE** ALL MEDICATION REFILL REQUESTS (INCLUDING CONTROLLED SUBSTANCES) NEED TO BE MADE AT LEAST 7 DAYS PRIOR TO REFILL BEING DUE. ANY REFILL REQUESTS INSIDE THAT TIME FRAME MAY RESULT IN DELAYS IN RECEIVING YOUR PRESCRIPTION.                    WORK ON MAINTAINING YOUR KNEE BEND (5-10 DEGREES) WHEN YOU PLACE WEIGHT ON YOUR LEFT LEG WHILE WALKING. YOU MIGHT HAVE TO SLOW THINGS DOWN TO ACHIEVE THE BEND CONSISTENTLY   FOR PAIN TYLENOL UP TO 3000 MG DAILY YOU MAY USE BACLOFEN IF NEEDED FOR SPASMS (ABOVE YOUR SCHEDULED AMOUNT)  HEATING PAD FOR YOUR LEFT THIGH  AND STRETCHES FOR YOUR LEFT THIGH LIKE WE TALKED ABOUT TODAY.

## 2022-10-27 NOTE — Progress Notes (Signed)
Subjective:    Patient ID: Jeremy [name], male    DOB: 1965-06-23, 57 y.o.   MRN: [id number]  HPI  Kregg is here in follow up of his right basal ganglia/thalamic hemorrhage. I last saw him in September 2023. He has tried to be active. He walks daily 30 minutes at a time  About a month ago he started developing pain in his left groin/thigh area. He uses icey-hot ointment, biofreeze which provide temporary relief. He has done some general stretching which may help. He continues on baclofen per baseline. He takes tylenol as well.    No changes in swallowing, bowels or bladder. Mood has been upbeat.   Pain Inventory Average Pain 4 Pain Right Now 4 My pain is burning  LOCATION OF PAIN  thigh  BOWEL Number of stools per week: not specified  BLADDER Normal  Mobility use a walker do you drive?  no  Function disabled: date disabled .  Neuro/Psych trouble walking  Prior Studies Any changes since last visit?  no  Physicians involved in your care Any changes since last visit?  no   Family History  Problem Relation Age of Onset   Hypertension Mother    Heart disease Father    Gout Father    Hypertension Father    Colon cancer Neg Hx    Colon polyps Neg Hx    Stomach cancer Neg Hx    Esophageal cancer Neg Hx    Social History   Socioeconomic History   Marital status: Married    Spouse name: Not on file   Number of children: 0   Years of education: 12th   Highest education level: Not on file  Occupational History   Occupation: retired    Associate Professor: [employer]  Tobacco Use   Smoking status: Never   Smokeless tobacco: Never  Vaping Use   Vaping status: Never Used  Substance and Sexual Activity   Alcohol use: No    Alcohol/week: 0.0 standard drinks of alcohol   Drug use: No   Sexual activity: Not on file  Other Topics Concern   Not on file  Social History Narrative   Diet      Do you drink/eat things with caffeine Coffee      Marital  Status Married What year were you married? 03/17/1997      Do you live in a house, apartment, assisted living, condo, trailer, etc.? House      Is it one or more stories? One level      How many persons live in your home?  2        Do you have any pets in your home?(please list) No      Highest level of education completed: 12th Grade Corale[employer] Rud      Current or past profession: [employer]      Do you exercise?: Yes    Type and how often: Exercise bicycle everyday      Do you have a Living Will? (Form that indicates scenarios where you would not want your life prolonged)      Do you have a DNR form?         If not, would you like to discuss one? No      Do you have signed POA/HPOA forms? No      Do you have difficulty bathing or dressing yourself? No      Do you have difficulty preparing food or eating? No  Do you have difficulty managing medications? No      Do you have difficulty managing your finances? No      Do you have difficulty affording your medications? No                  Social Determinants of Health   Financial Resource Strain: Not on file  Food Insecurity: Not on file  Transportation Needs: Not on file  Physical Activity: Not on file  Stress: Not on file  Social Connections: Not on file   Past Surgical History:  Procedure Laterality Date   COLONOSCOPY  02/26/2020   [facility] [place]   TONSILLECTOMY  2000   Per [facility] new patient packet   TRACHEOSTOMY TUBE PLACEMENT  10/11/2011   Procedure: TRACHEOSTOMY;  Surgeon: Christia Reading, MD;  Location[facility] OR;  Service: ENT;  Laterality: N/A;   Past Medical History:  Diagnosis Date   Diabetes ([facility])    Per [facility] new patient packet   High cholesterol    Hypertension 10/01/2011   basal ganglia   ICH (intracerebral hemorrhage) ([facility]) 10/01/2011   ICH in 09/2011.     Stroke ([facility])    2013   BP (!) 139/90   Pulse 88   Ht 5\' 10"  (1.778 m)   Wt 262 lb 3.2 oz (118.9 kg)   SpO2 94%   BMI  37.62 kg/m   Opioid Risk Score:   Fall Risk Score:  `1  Depression screen [facility] 2/9     10/27/2022   11:08 AM 10/29/2020   10:42 AM 10/31/2019   10:50 AM 09/13/2019    8:48 AM 06/07/2019    8:27 AM 03/08/2019    1:35 PM 02/27/2019    8:57 AM  Depression screen PHQ 2/9  Decreased Interest 0 0 1 0 0 0 0  Down, Depressed, Hopeless 0 0 1 0 0 0 0  PHQ - 2 Score 0 0 2 0 0 0 0  Altered sleeping      0   Tired, decreased energy      0   Change in appetite      0   Feeling bad or failure about yourself       0   Trouble concentrating      0   Moving slowly or fidgety/restless      0   Suicidal thoughts      0   PHQ-9 Score      0   Difficult doing work/chores      Not difficult at all      Review of Systems  Constitutional: Negative.   HENT: Negative.    Eyes: Negative.   Respiratory: Negative.    Cardiovascular: Negative.   Gastrointestinal: Negative.   Endocrine:       High blood sugars  Genitourinary: Negative.   Musculoskeletal:  Positive for gait problem.  Skin: Negative.   Allergic/Immunologic: Negative.   Hematological: Negative.   Psychiatric/Behavioral: Negative.    All other systems reviewed and are negative.      Objective:   Physical Exam General: No acute distress. obese HEENT: NCAT, EOMI, oral membranes moist Cards: reg rate  Chest: normal effort Abdomen: Soft, NT, ND Skin: dry, intact Extremities: no edema Psych: pleasant and appropriate  Neuro:  dysarthric. Improved insight. .  Reflexes are 3+ on the left. No resting tone. 5/5 motor on the right. LUE and LLE 5/5. Sensory 1/2 LUE and LLE-unchanged, 4-5 beats of clonus LLE. Left knee with recurvatum  during stance. Tends to rotate the hip sl externally to help with hip flexion. Uses RW for balance. Musculoskeletal:  full ROM left hip. Mild pain with deep palpation of left hip adductors       Assessment & Plan:   ASSESSMENT: 1. Right BG/thalamic hemorrhage-   2. HTN  3. Spastic left hemiparesis with  intermittent spasms  new neuropathic pain 4. Dental caries --still waiting on getting this done 5.  Left carpal tunnel syndrome.  Appears mild and superimposed upon his left sided sensory deficits from his ICH 6. Left thigh pain- I suspect this is a strain of his left hip adductors which are helping more with hip flexion. Left knee recurvatum also may be contributing.   Plan:  1. Continue baclofen for spasms. Seem controlled at present.  -may use PRN dose if he has left hip adductor spasms 2.  Discussed importance of improved mechanics, needs to take time to work on better knee bend and control of LLE. -regular stretching:           -heating pad  -addnl baclofen for spasms as above 3. Tylenol for general pain control  --may take up to 3000mg  daily 4. continue left CTS splint.         Twenty minutes of face to face patient care time were spent during this visit. All questions were encouraged and answered.  Follow up with me in 1 year .

## 2023-09-06 ENCOUNTER — Ambulatory Visit: Admitting: Family Medicine

## 2023-10-25 NOTE — Progress Notes (Unsigned)
 Subjective:    Patient ID: Jeremy [name], male    DOB: November 05, 1965, 58 y.o.   MRN: [id number]  HPI  Jeremy [name] is here in follow up of his right BG hemorrhage. He has been noticing pain behind his left shoulder for the past several months. It bothers him when cool air touches it. Moving/walking don't seem to irritate. He describes it as a shocking/pins and needles in quality. He is also is having a tingling pain in his left hamstring. He has put icey hot on the areas with some relief. Voltaren  gel helps also.  He's taking gabapentin  100mg  bid currently. He's had no issues with tolerance.   He remains on baclofen  for spasms which seems to control them. Sugars are well controlled.   He continues with his PEG. He does do some eating for pleasure but primarily feeds and takes meds thru PEG  Pain Inventory Average Pain 5 Pain Right Now 5 My pain is intermittent, constant, and burning  LOCATION OF PAIN  left back, left leg, left foot, left arm  BOWEL Number of stools per week: 7 Oral laxative use No   BLADDER Normal    Mobility walk with assistance use a walker how many minutes can you walk? unknown ability to climb steps?  yes do you drive?  no Do you have any goals in this area?  yes  Function disabled: date disabled 2013 Do you have any goals in this area?  yes  Neuro/Psych weakness numbness tingling trouble walking anxiety  Prior Studies Any changes since last visit?  no  Physicians involved in your care Any changes since last visit?  no   Family History  Problem Relation Age of Onset   Hypertension Mother    Heart disease Father    Gout Father    Hypertension Father    Colon cancer Neg Hx    Colon polyps Neg Hx    Stomach cancer Neg Hx    Esophageal cancer Neg Hx    Social History   Socioeconomic History   Marital status: Married    Spouse name: Not on file   Number of children: 0   Years of education: 12th   Highest education level: Not on file   Occupational History   Occupation: retired    Associate Professor: [employer]  Tobacco Use   Smoking status: Never   Smokeless tobacco: Never  Vaping Use   Vaping status: Never Used  Substance and Sexual Activity   Alcohol use: No    Alcohol/week: 0.0 standard drinks of alcohol   Drug use: No   Sexual activity: Not on file  Other Topics Concern   Not on file  Social History Narrative   Diet      Do you drink/eat things with caffeine Coffee      Marital Status Married What year were you married? 03/17/1997      Do you live in a house, apartment, assisted living, condo, trailer, etc.? House      Is it one or more stories? One level      How many persons live in your home?  2        Do you have any pets in your home?(please list) No      Highest level of education completed: 12th Grade Jeremy [name]      Current or past profession: [employer]      Do you exercise?: Yes    Type and how often: Exercise bicycle everyday  Do you have a Living Will? (Form that indicates scenarios where you would not want your life prolonged)      Do you have a DNR form?         If not, would you like to discuss one? No      Do you have signed POA/HPOA forms? No      Do you have difficulty bathing or dressing yourself? No      Do you have difficulty preparing food or eating? No      Do you have difficulty managing medications? No      Do you have difficulty managing your finances? No      Do you have difficulty affording your medications? No                  Social Drivers of Corporate investment banker Strain: Not on file  Food Insecurity: Not on file  Transportation Needs: Not on file  Physical Activity: Not on file  Stress: Not on file  Social Connections: Not on file   Past Surgical History:  Procedure Laterality Date   COLONOSCOPY  02/26/2020   [facility]   TONSILLECTOMY  2000   Per [facility] new patient packet   TRACHEOSTOMY TUBE  PLACEMENT  10/11/2011   Procedure: TRACHEOSTOMY;  Surgeon: Vaughan Ricker, MD;  Location: [facility] OR;  Service: ENT;  Laterality: N/A;   Past Medical History:  Diagnosis Date   Diabetes (HCC)    Per [facility] new patient packet   High cholesterol    Hypertension 10/01/2011   basal ganglia   ICH (intracerebral hemorrhage) (HCC) 10/01/2011   ICH in 09/2011.     Stroke [facility])    2013   There were no vitals taken for this visit.  Opioid Risk Score:   Fall Risk Score:  `1  Depression screen [facility] 2/9     10/27/2022   11:08 AM 10/29/2020   10:42 AM 10/31/2019   10:50 AM 09/13/2019    8:48 AM 06/07/2019    8:27 AM 03/08/2019    1:35 PM 02/27/2019    8:57 AM  Depression screen [facility] 2/9  Decreased Interest 0 0 1 0 0 0 0  Down, Depressed, Hopeless 0 0 1 0 0 0 0  PHQ - 2 Score 0 0 2 0 0 0 0  Altered sleeping      0   Tired, decreased energy      0   Change in appetite      0   Feeling bad or failure about yourself       0   Trouble concentrating      0   Moving slowly or fidgety/restless      0   Suicidal thoughts      0   PHQ-9 Score      0   Difficult doing work/chores      Not difficult at all     Review of Systems  Musculoskeletal:  Positive for gait problem.  Neurological:  Positive for weakness and numbness.       Tingling  Psychiatric/Behavioral:         Anxiety  All other systems reviewed and are negative.      Objective:   Physical Exam General: No acute distress. obese HEENT: NCAT, EOMI, oral membranes moist Cards: reg rate  Chest: normal effort Abdomen: Soft, NT, ND Skin: dry, intact Extremities: no edema Psych: pleasant and appropriate  Neuro:  remains dysarthric. Reasonable insight  and awareness .  Reflexes are 3+ on the left. No resting tone. 5/5 motor on the right. LUE and LLE 5/5. Sensory 1/2 LUE and LLE-unchanged, 4-5 beats of clonus LLE. Still with difficulty clearing left foot during swing of gait Musculoskeletal:  full ROM left hip.  Jeremy [name] No pain with ROM of left shoulder  or knee. f       Assessment & Plan:   ASSESSMENT: 1. Right BG/thalamic hemorrhage-   2. HTN  3. Spastic left hemiparesis with left hemisensory loss and intermittent spasms  new neuropathic pain 4. Dental caries --still waiting on getting this done 5.  Left carpal tunnel syndrome.  Appears mild and superimposed upon his left sided sensory deficits from his ICH    Plan:  1. Continue baclofen  for spasms. Seem controlled at present.           - baclofen  scheduled and prn 2.  Have previously reviewed the importance of improved mechanics, needs to take time to work on better knee bend and control of LLE. -regular stretching:           -heating pad           -addnl baclofen  for spasms as above 3. Tylenol  for general pain control  --may take up to 3000mg  daily 4. Neuropathic left sided pain -gabapentin  100mg  bid -increase to 100 mg in am and afternoon and then 300mg  at night for 1st week -300-100-300mg  for 2nd week  -300mg  three x daily       Twenty minutes of face to face patient care time were spent during this visit. All questions were encouraged and answered.  Follow up with me in 3 mos.

## 2023-10-26 ENCOUNTER — Encounter: Payer: Self-pay | Admitting: Physical Medicine & Rehabilitation

## 2023-10-26 ENCOUNTER — Encounter: Payer: 59 | Attending: Physical Medicine & Rehabilitation | Admitting: Physical Medicine & Rehabilitation

## 2023-10-26 VITALS — BP 137/83 | HR 85 | Ht 70.0 in | Wt 260.0 lb

## 2023-10-26 DIAGNOSIS — M792 Neuralgia and neuritis, unspecified: Secondary | ICD-10-CM | POA: Diagnosis present

## 2023-10-26 MED ORDER — GABAPENTIN 300 MG PO CAPS: 300.0000 mg | ORAL_CAPSULE | Freq: Three times a day (TID) | ORAL | 3 refills | Status: DC

## 2023-10-26 NOTE — Patient Instructions (Signed)
 ALWAYS FEEL FREE TO CALL OUR OFFICE WITH ANY PROBLEMS OR QUESTIONS [phone])  **PLEASE NOTE** ALL MEDICATION REFILL REQUESTS (INCLUDING CONTROLLED SUBSTANCES) NEED TO BE MADE AT LEAST 7 DAYS PRIOR TO REFILL BEING DUE. ANY REFILL REQUESTS INSIDE THAT TIME FRAME MAY RESULT IN DELAYS IN RECEIVING YOUR PRESCRIPTION.    -gabapentin  trial -100 mg in am and afternoon and then 300mg  at night for 1st week -300-100-300mg  for 2nd week  -300mg  three x daily thereafter

## 2024-01-25 ENCOUNTER — Encounter: Payer: Self-pay | Admitting: Physical Medicine & Rehabilitation

## 2024-01-25 ENCOUNTER — Encounter: Attending: Physical Medicine & Rehabilitation | Admitting: Physical Medicine & Rehabilitation

## 2024-01-25 VITALS — BP 136/88 | HR 83 | Ht 70.0 in | Wt 274.8 lb

## 2024-01-25 DIAGNOSIS — G8114 Spastic hemiplegia affecting left nondominant side: Secondary | ICD-10-CM | POA: Diagnosis present

## 2024-01-25 DIAGNOSIS — I610 Nontraumatic intracerebral hemorrhage in hemisphere, subcortical: Secondary | ICD-10-CM

## 2024-01-25 DIAGNOSIS — M792 Neuralgia and neuritis, unspecified: Secondary | ICD-10-CM | POA: Insufficient documentation

## 2024-01-25 DIAGNOSIS — I619 Nontraumatic intracerebral hemorrhage, unspecified: Secondary | ICD-10-CM | POA: Insufficient documentation

## 2024-01-25 MED ORDER — GABAPENTIN 400 MG PO CAPS: 400.0000 mg | ORAL_CAPSULE | Freq: Three times a day (TID) | ORAL | 5 refills | Status: DC

## 2024-01-25 NOTE — Progress Notes (Signed)
 Subjective:    Patient ID: Jeremy [name], male    DOB: 11-10-1965, 58 y.o.   MRN: [id number]  HPI  Discussed the use of AI scribe software for clinical note transcription with the patient, who gave verbal consent to proceed.  History of Present Illness Jeremy [name] is a 58 year old male who presents for follow-up of left leg pain and spasms.  Left leg pain - Pain has improved since last visit three months ago after increasing gabapentin  to 300 mg three times daily - Pain is now tolerable - Walking exacerbates pain, especially in winter  Left leg muscle spasms - Continues to experience spasms in the left leg - Performs stretching exercises for relief  Physical activity and exercise - Maintains a routine of walking, sit-ups, and range of motion exercises in the mornings, which are beneficial - Engages in arm exercises and CrossFit activities  Back symptoms - No current back pain  Bowel and bladder function - Normal bowel and bladder function  Mood - Mood is stable  Dental issues - Ongoing dental issues - In contact with [facility], but no recent progress   Pain Inventory Average Pain 5 Pain Right Now 5 My pain is sharp  In the last 24 hours, has pain interfered with the following? General activity 5 Relation with others 5 Enjoyment of life 5 What TIME of day is your pain at its worst? daytime Sleep (in general) Good  Pain is worse with: sitting and standing Pain improves with: heat/ice, therapy/exercise, and medication Relief from Meds: 5  Family History  Problem Relation Age of Onset   Hypertension Mother    Heart disease Father    Gout Father    Hypertension Father    Colon cancer Neg Hx    Colon polyps Neg Hx    Stomach cancer Neg Hx    Esophageal cancer Neg Hx    Social History   Socioeconomic History   Marital status: Married    Spouse name: Not on file   Number of children: 0   Years of education: 12th   Highest education level:  Not on file  Occupational History   Occupation: retired    Associate Professor: [employer]  Tobacco Use   Smoking status: Never   Smokeless tobacco: Never  Vaping Use   Vaping status: Never Used  Substance and Sexual Activity   Alcohol use: No    Alcohol/week: 0.0 standard drinks of alcohol   Drug use: No   Sexual activity: Not on file  Other Topics Concern   Not on file  Social History Narrative   Diet      Do you drink/eat things with caffeine Coffee      Marital Status Married What year were you married? 03/17/1997      Do you live in a house, apartment, assisted living, condo, trailer, etc.? House      Is it one or more stories? One level      How many persons live in your home?  2        Do you have any pets in your home?(please list) No      Highest level of education completed: 12th Grade Jeremy [name]      Current or past profession: [employer]      Do you exercise?: Yes    Type and how often: Exercise bicycle everyday      Do you have a Living Will? (Form that indicates scenarios where you would not  want your life prolonged)      Do you have a DNR form?         If not, would you like to discuss one? No      Do you have signed POA/HPOA forms? No      Do you have difficulty bathing or dressing yourself? No      Do you have difficulty preparing food or eating? No      Do you have difficulty managing medications? No      Do you have difficulty managing your finances? No      Do you have difficulty affording your medications? No                  Social Drivers of Corporate Investment Banker Strain: Not on file  Food Insecurity: Not on file  Transportation Needs: Not on file  Physical Activity: Not on file  Stress: Not on file  Social Connections: Not on file   Past Surgical History:  Procedure Laterality Date   COLONOSCOPY  02/26/2020   [facility]   TONSILLECTOMY  2000   Per [facility] new patient packet   TRACHEOSTOMY  TUBE PLACEMENT  10/11/2011   Procedure: TRACHEOSTOMY;  Surgeon: Vaughan Ricker, MD;  Location: [facility] OR;  Service: ENT;  Laterality: N/A;   Past Surgical History:  Procedure Laterality Date   COLONOSCOPY  02/26/2020   [facility]   TONSILLECTOMY  2000   Per [facility] new patient packet   TRACHEOSTOMY TUBE PLACEMENT  10/11/2011   Procedure: TRACHEOSTOMY;  Surgeon: Vaughan Ricker, MD;  Location: [facility] OR;  Service: ENT;  Laterality: N/A;   Past Medical History:  Diagnosis Date   Diabetes (HCC)    Per [facility] new patient packet   High cholesterol    Hypertension 10/01/2011   basal ganglia   ICH (intracerebral hemorrhage) (HCC) 10/01/2011   ICH in 09/2011.     Stroke (HCC)    2013   BP 136/88 (BP Location: Left Arm, Patient Position: Sitting, Cuff Size: Large)   Pulse 83   Ht 5' 10 (1.778 m)   Wt 274 lb 12.8 oz (124.6 kg)   SpO2 95%   BMI 39.43 kg/m   Opioid Risk Score:   Fall Risk Score:  `1  Depression screen [facility] 2/9     10/26/2023   11:41 AM 10/27/2022   11:08 AM 10/29/2020   10:42 AM 10/31/2019   10:50 AM 09/13/2019    8:48 AM 06/07/2019    8:27 AM 03/08/2019    1:35 PM  Depression screen [facility] 2/9  Decreased Interest 0 0 0 1 0 0 0  Down, Depressed, Hopeless 0 0 0 1 0 0 0  PHQ - 2 Score 0 0 0 2 0 0 0  Altered sleeping       0  Tired, decreased energy       0  Change in appetite       0  Feeling bad or failure about yourself        0  Trouble concentrating       0  Moving slowly or fidgety/restless       0  Suicidal thoughts       0  PHQ-9 Score       0   Difficult doing work/chores       Not difficult at all     Data saved with a previous flowsheet row definition     Review of Systems  Musculoskeletal:  Positive for myalgias.       Left hamstring  All other systems reviewed and are negative.      Objective:   Physical Exam  General: No acute distress. obese HEENT: NCAT, EOMI, oral membranes moist Cards: reg rate  Chest: normal effort Abdomen: Soft, NT, ND Skin:  dry, intact Extremities: no edema Psych: pleasant and appropriate     Neuro:  remains dysarthric. Reasonable insight and awareness .  Reflexes are 3+ on the left. No resting tone. 5/5 motor on the right. LUE and LLE 5/5. Sensory 1/2 LUE and LLE-unchanged, 4-5 beats of clonus LLE. Still with difficulty clearing left foot during swing of gait Musculoskeletal:  full ROM left hip.  SABRA No pain with ROM of left shoulder or knee. f       Assessment & Plan:   ASSESSMENT: 1. Right BG/thalamic hemorrhage-   2. HTN  3. Spastic left hemiparesis with left hemisensory loss and intermittent spasms  new neuropathic pain 4. Dental caries --still working on it 5.  Left carpal tunnel syndrome.  Appears mild and superimposed upon his left sided sensory deficits from his ICH     Plan:  1. Continue baclofen  for spasms. Seem controlled at present.           - baclofen  scheduled and prn 2.  Have previously reviewed the importance of improved mechanics, needs to take time to work on better knee bend and control of LLE. -regular stretching:           -heating pad           -addnl baclofen  for spasms as needed  -HEP 3. Tylenol  for general pain control  --may take up to 3000mg  daily 4. Neuropathic left sided pain -gabapentin  300mg  tid--increase to 400mg       Twenty minutes of face to face patient care time were spent during this visit. All questions were encouraged and answered.  Follow up with me in 6 mos .

## 2024-01-25 NOTE — Patient Instructions (Signed)
°  VISIT SUMMARY: Today, you came in for a follow-up visit regarding your left leg pain and spasms. We discussed your current symptoms, physical activity routine, and overall health status.  YOUR PLAN: -LEFT LEG PAIN: Your left leg pain has improved since your last visit, and it is now tolerable. Pain tends to worsen with walking, especially in cold weather. We have increased your gabapentin  dosage to 400 mg three times a day to help manage the pain better.  -LEFT LEG MUSCLE SPASMS: You continue to experience muscle spasms in your left leg. These spasms are currently being managed with stretching exercises. Please continue with these exercises to help control the spasms.  -PHYSICAL ACTIVITY AND EXERCISE: You are maintaining a good routine of walking, sit-ups, and range of motion exercises in the mornings, along with arm exercises and CrossFit activities. Continue with these activities as they are beneficial for your overall health.  -GENERAL HEALTH MAINTENANCE: Your mood is stable, and your bowel and bladder functions are normal. You are experiencing ongoing dental issues and are in contact with [facility] for further care. Continue to monitor your dental health and follow up as needed.  INSTRUCTIONS: Please follow up with us  in three months to reassess your condition. Continue taking gabapentin  as prescribed and maintain your physical activity and stretching exercises. If you experience any new or worsening symptoms, contact our office.

## 2024-03-11 ENCOUNTER — Other Ambulatory Visit: Payer: Self-pay | Admitting: Physical Medicine & Rehabilitation

## 2024-03-11 DIAGNOSIS — M792 Neuralgia and neuritis, unspecified: Secondary | ICD-10-CM

## 2024-07-25 ENCOUNTER — Encounter: Admitting: Physical Medicine & Rehabilitation
